# Patient Record
Sex: Female | Born: 1945
Health system: Southern US, Community
[De-identification: ages and names within clinical notes are randomized; demographics above are authoritative.]

## PROBLEM LIST (undated history)

## (undated) DIAGNOSIS — F32A Depression, unspecified: Secondary | ICD-10-CM

## (undated) DIAGNOSIS — F329 Major depressive disorder, single episode, unspecified: Secondary | ICD-10-CM

## (undated) DIAGNOSIS — I10 Essential (primary) hypertension: Secondary | ICD-10-CM

## (undated) DIAGNOSIS — C801 Malignant (primary) neoplasm, unspecified: Secondary | ICD-10-CM

## (undated) HISTORY — DX: Depression, unspecified: F32.A

## (undated) HISTORY — DX: Essential (primary) hypertension: I10

## (undated) HISTORY — PX: BACK SURGERY: SHX140

## (undated) HISTORY — PX: ABDOMINAL HYSTERECTOMY: SHX81

---
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
## 1999-12-05 DIAGNOSIS — C801 Malignant (primary) neoplasm, unspecified: Secondary | ICD-10-CM

## 1999-12-05 HISTORY — DX: Malignant (primary) neoplasm, unspecified: C80.1

## 2000-07-02 ENCOUNTER — Ambulatory Visit (HOSPITAL_COMMUNITY): Admission: RE | Admit: 2000-07-02 | Discharge: 2000-07-02 | Payer: Self-pay | Admitting: Radiation Oncology

## 2000-07-05 ENCOUNTER — Encounter: Admission: RE | Admit: 2000-07-05 | Discharge: 2000-10-03 | Payer: Self-pay | Admitting: Radiation Oncology

## 2006-12-12 ENCOUNTER — Encounter: Admission: RE | Admit: 2006-12-12 | Discharge: 2006-12-12 | Payer: Self-pay | Admitting: Hematology and Oncology

## 2007-02-26 ENCOUNTER — Ambulatory Visit (HOSPITAL_BASED_OUTPATIENT_CLINIC_OR_DEPARTMENT_OTHER): Admission: RE | Admit: 2007-02-26 | Discharge: 2007-02-26 | Payer: Self-pay | Admitting: Urology

## 2011-04-21 NOTE — Op Note (Signed)
NAME:  Carolyn Erickson, PRIM NO.:  [id number]   MEDICAL RECORD NO.:  [id number]          PATIENT TYPE:  AMB   LOCATION:  [facility]                         FACILITY:  [facility]   PHYSICIAN:  Jamison Neighbor, M.D.  DATE OF BIRTH:  March 11, 1946   DATE OF PROCEDURE:  02/26/2007  DATE OF DISCHARGE:                               OPERATIVE REPORT   PREOPERATIVE DIAGNOSES:  Urgency, incontinence.   POSTOPERATIVE DIAGNOSES:  Urgency, incontinence.   PROCEDURE:  Cystoscopy, urethral calibration, and Botox injection.   SURGEON:  Jamison Neighbor, M.D.   ANESTHESIA:  General.   COMPLICATIONS:  None.   DRAINS:  None.   BRIEF HISTORY:  This 65 year old female has had problems with severe  urinary urgency and frequency.  Urodynamics shows an extremely unstable  bladder.  The patient has had a lack of response to anticholinergic  therapy and has tried and failed at least 3 different medications.  The  patient was offered several options for therapy.  She did not feel she  could come to the office for physical therapy.  She was not willing to  consider posterior tibial nerve stimulation.  She did not like the idea  of InterStim, and at this point is unwilling to consider augmentation  cystoplasty or other lower urinary tract reconstruction.  The patient is  willing to try a Botox injection.  She is fully aware of the  experimental nature of this treatment and the fact that there is  certainly no guarantee that she is going to have improvement in her  frequency and urgency.  She also is aware of the fact that she could  have problems with incomplete emptying that may require catheterization  or in-and-out catheterization.  She gave full informed consent.   PROCEDURE:  After successful induction of general anesthesia, the  patient was placed in the dorsal lithotomy position, prepped with  Betadine and draped in the usual sterile fashion.  Careful bimanual  examination revealed an  unremarkable vaginal vault.  She was well  supported at the urethra.  There was no sign of a diverticulum.  She did  not have vault prolapse.  The vault was slightly small following  hysterectomy.  There was no significant cystocele, rectocele or  enterocele.  The urethra was slightly tight but was easily dilated at 71  [place] with female urethral sounds.  The cystoscope was inserted.  The  bladder was carefully inspected.  Both ureteral orifices were normal in  configuration and location.  Clear urine was seen to efflux from each.  No tumors or stones could be identified.  The patient does have a very  trabeculated bladder which is consistent with detrusor sphincter  dyssynergia and dysfunctional voiding as evidenced by her urodynamic  study.  This certainly may indicate a good response to the Botox and may  also indicate that the patient could respond in theory to muscle  relaxant therapy directed at the pelvic  floor.  The patient received a total of 3 ampules of Botox in divided  dosages throughout the bladder including the trigone.  The bladder was  drained.  The patient tolerated the procedure well and was taken to the  recovery room in good condition.           ______________________________  Jamison Neighbor, M.D.  Electronically Signed     RJE/MEDQ  D:  02/26/2007  T:  02/26/2007  Job:  [id number]

## 2016-05-12 DIAGNOSIS — F39 Unspecified mood [affective] disorder: Secondary | ICD-10-CM | POA: Diagnosis not present

## 2016-05-12 DIAGNOSIS — Z0000 Encounter for general adult medical examination without abnormal findings: Secondary | ICD-10-CM | POA: Diagnosis not present

## 2016-05-12 DIAGNOSIS — I10 Essential (primary) hypertension: Secondary | ICD-10-CM | POA: Diagnosis not present

## 2016-05-12 DIAGNOSIS — Z6822 Body mass index (BMI) 22.0-22.9, adult: Secondary | ICD-10-CM | POA: Diagnosis not present

## 2016-05-12 DIAGNOSIS — Z853 Personal history of malignant neoplasm of breast: Secondary | ICD-10-CM | POA: Diagnosis not present

## 2016-05-12 DIAGNOSIS — E7800 Pure hypercholesterolemia, unspecified: Secondary | ICD-10-CM | POA: Diagnosis not present

## 2016-05-12 DIAGNOSIS — M5136 Other intervertebral disc degeneration, lumbar region: Secondary | ICD-10-CM | POA: Diagnosis not present

## 2016-05-12 DIAGNOSIS — M5030 Other cervical disc degeneration, unspecified cervical region: Secondary | ICD-10-CM | POA: Diagnosis not present

## 2016-05-15 DIAGNOSIS — Z1231 Encounter for screening mammogram for malignant neoplasm of breast: Secondary | ICD-10-CM | POA: Diagnosis not present

## 2016-07-21 DIAGNOSIS — Z6822 Body mass index (BMI) 22.0-22.9, adult: Secondary | ICD-10-CM | POA: Diagnosis not present

## 2016-07-21 DIAGNOSIS — R300 Dysuria: Secondary | ICD-10-CM | POA: Diagnosis not present

## 2016-09-13 DIAGNOSIS — R05 Cough: Secondary | ICD-10-CM | POA: Diagnosis not present

## 2016-09-13 DIAGNOSIS — J209 Acute bronchitis, unspecified: Secondary | ICD-10-CM | POA: Diagnosis not present

## 2016-09-18 DIAGNOSIS — H903 Sensorineural hearing loss, bilateral: Secondary | ICD-10-CM | POA: Diagnosis not present

## 2016-09-20 DIAGNOSIS — Z23 Encounter for immunization: Secondary | ICD-10-CM | POA: Diagnosis not present

## 2016-11-22 DIAGNOSIS — L219 Seborrheic dermatitis, unspecified: Secondary | ICD-10-CM | POA: Diagnosis not present

## 2016-11-22 DIAGNOSIS — L570 Actinic keratosis: Secondary | ICD-10-CM | POA: Diagnosis not present

## 2016-12-18 DIAGNOSIS — H40033 Anatomical narrow angle, bilateral: Secondary | ICD-10-CM | POA: Diagnosis not present

## 2016-12-18 DIAGNOSIS — H2513 Age-related nuclear cataract, bilateral: Secondary | ICD-10-CM | POA: Diagnosis not present

## 2016-12-28 DIAGNOSIS — H43813 Vitreous degeneration, bilateral: Secondary | ICD-10-CM | POA: Diagnosis not present

## 2016-12-28 DIAGNOSIS — H25813 Combined forms of age-related cataract, bilateral: Secondary | ICD-10-CM | POA: Diagnosis not present

## 2017-01-15 DIAGNOSIS — H2512 Age-related nuclear cataract, left eye: Secondary | ICD-10-CM | POA: Diagnosis not present

## 2017-01-15 DIAGNOSIS — H25812 Combined forms of age-related cataract, left eye: Secondary | ICD-10-CM | POA: Diagnosis not present

## 2017-01-22 DIAGNOSIS — H04123 Dry eye syndrome of bilateral lacrimal glands: Secondary | ICD-10-CM | POA: Diagnosis not present

## 2017-01-22 DIAGNOSIS — H25812 Combined forms of age-related cataract, left eye: Secondary | ICD-10-CM | POA: Diagnosis not present

## 2017-01-22 DIAGNOSIS — Z961 Presence of intraocular lens: Secondary | ICD-10-CM | POA: Diagnosis not present

## 2017-02-12 DIAGNOSIS — R238 Other skin changes: Secondary | ICD-10-CM | POA: Diagnosis not present

## 2017-02-14 DIAGNOSIS — Z1272 Encounter for screening for malignant neoplasm of vagina: Secondary | ICD-10-CM | POA: Diagnosis not present

## 2017-02-14 DIAGNOSIS — Z01419 Encounter for gynecological examination (general) (routine) without abnormal findings: Secondary | ICD-10-CM | POA: Diagnosis not present

## 2017-02-14 DIAGNOSIS — I10 Essential (primary) hypertension: Secondary | ICD-10-CM | POA: Diagnosis not present

## 2017-02-14 DIAGNOSIS — F339 Major depressive disorder, recurrent, unspecified: Secondary | ICD-10-CM | POA: Diagnosis not present

## 2017-04-02 DIAGNOSIS — H25811 Combined forms of age-related cataract, right eye: Secondary | ICD-10-CM | POA: Diagnosis not present

## 2017-04-02 DIAGNOSIS — Z961 Presence of intraocular lens: Secondary | ICD-10-CM | POA: Diagnosis not present

## 2017-04-18 DIAGNOSIS — H26492 Other secondary cataract, left eye: Secondary | ICD-10-CM | POA: Diagnosis not present

## 2017-05-08 DIAGNOSIS — Z09 Encounter for follow-up examination after completed treatment for conditions other than malignant neoplasm: Secondary | ICD-10-CM | POA: Diagnosis not present

## 2017-05-14 DIAGNOSIS — H04123 Dry eye syndrome of bilateral lacrimal glands: Secondary | ICD-10-CM | POA: Diagnosis not present

## 2017-05-18 DIAGNOSIS — Z0000 Encounter for general adult medical examination without abnormal findings: Secondary | ICD-10-CM | POA: Diagnosis not present

## 2017-05-18 DIAGNOSIS — Z853 Personal history of malignant neoplasm of breast: Secondary | ICD-10-CM | POA: Diagnosis not present

## 2017-05-18 DIAGNOSIS — Z6823 Body mass index (BMI) 23.0-23.9, adult: Secondary | ICD-10-CM | POA: Diagnosis not present

## 2017-05-18 DIAGNOSIS — M5136 Other intervertebral disc degeneration, lumbar region: Secondary | ICD-10-CM | POA: Diagnosis not present

## 2017-05-18 DIAGNOSIS — M5030 Other cervical disc degeneration, unspecified cervical region: Secondary | ICD-10-CM | POA: Diagnosis not present

## 2017-05-18 DIAGNOSIS — Z86010 Personal history of colonic polyps: Secondary | ICD-10-CM | POA: Diagnosis not present

## 2017-05-18 DIAGNOSIS — I10 Essential (primary) hypertension: Secondary | ICD-10-CM | POA: Diagnosis not present

## 2017-05-18 DIAGNOSIS — F39 Unspecified mood [affective] disorder: Secondary | ICD-10-CM | POA: Diagnosis not present

## 2017-05-18 DIAGNOSIS — E7800 Pure hypercholesterolemia, unspecified: Secondary | ICD-10-CM | POA: Diagnosis not present

## 2017-12-10 DIAGNOSIS — H918X1 Other specified hearing loss, right ear: Secondary | ICD-10-CM | POA: Diagnosis not present

## 2017-12-10 DIAGNOSIS — Z01118 Encounter for examination of ears and hearing with other abnormal findings: Secondary | ICD-10-CM | POA: Diagnosis not present

## 2017-12-10 DIAGNOSIS — H93291 Other abnormal auditory perceptions, right ear: Secondary | ICD-10-CM | POA: Diagnosis not present

## 2017-12-17 DIAGNOSIS — H40033 Anatomical narrow angle, bilateral: Secondary | ICD-10-CM | POA: Diagnosis not present

## 2017-12-17 DIAGNOSIS — G44219 Episodic tension-type headache, not intractable: Secondary | ICD-10-CM | POA: Diagnosis not present

## 2017-12-26 DIAGNOSIS — H35373 Puckering of macula, bilateral: Secondary | ICD-10-CM | POA: Diagnosis not present

## 2017-12-26 DIAGNOSIS — H04123 Dry eye syndrome of bilateral lacrimal glands: Secondary | ICD-10-CM | POA: Diagnosis not present

## 2017-12-26 DIAGNOSIS — H26491 Other secondary cataract, right eye: Secondary | ICD-10-CM | POA: Diagnosis not present

## 2018-01-07 DIAGNOSIS — H2513 Age-related nuclear cataract, bilateral: Secondary | ICD-10-CM | POA: Diagnosis not present

## 2018-01-11 DIAGNOSIS — M5412 Radiculopathy, cervical region: Secondary | ICD-10-CM | POA: Diagnosis not present

## 2018-01-11 DIAGNOSIS — M5030 Other cervical disc degeneration, unspecified cervical region: Secondary | ICD-10-CM | POA: Insufficient documentation

## 2018-02-22 DIAGNOSIS — H04123 Dry eye syndrome of bilateral lacrimal glands: Secondary | ICD-10-CM | POA: Diagnosis not present

## 2018-05-28 DIAGNOSIS — Z0000 Encounter for general adult medical examination without abnormal findings: Secondary | ICD-10-CM | POA: Diagnosis not present

## 2018-05-28 DIAGNOSIS — Z13220 Encounter for screening for lipoid disorders: Secondary | ICD-10-CM | POA: Diagnosis not present

## 2018-05-28 DIAGNOSIS — I10 Essential (primary) hypertension: Secondary | ICD-10-CM | POA: Diagnosis not present

## 2018-05-28 DIAGNOSIS — Z136 Encounter for screening for cardiovascular disorders: Secondary | ICD-10-CM | POA: Diagnosis not present

## 2018-05-28 DIAGNOSIS — Z23 Encounter for immunization: Secondary | ICD-10-CM | POA: Diagnosis not present

## 2018-06-17 DIAGNOSIS — N958 Other specified menopausal and perimenopausal disorders: Secondary | ICD-10-CM | POA: Diagnosis not present

## 2018-06-17 DIAGNOSIS — M8589 Other specified disorders of bone density and structure, multiple sites: Secondary | ICD-10-CM | POA: Diagnosis not present

## 2018-06-17 DIAGNOSIS — M8588 Other specified disorders of bone density and structure, other site: Secondary | ICD-10-CM | POA: Diagnosis not present

## 2018-06-17 DIAGNOSIS — M85852 Other specified disorders of bone density and structure, left thigh: Secondary | ICD-10-CM | POA: Diagnosis not present

## 2018-07-09 DIAGNOSIS — Z1231 Encounter for screening mammogram for malignant neoplasm of breast: Secondary | ICD-10-CM | POA: Diagnosis not present

## 2018-12-17 DIAGNOSIS — Z01118 Encounter for examination of ears and hearing with other abnormal findings: Secondary | ICD-10-CM | POA: Diagnosis not present

## 2018-12-17 DIAGNOSIS — H903 Sensorineural hearing loss, bilateral: Secondary | ICD-10-CM | POA: Diagnosis not present

## 2018-12-19 DIAGNOSIS — H40033 Anatomical narrow angle, bilateral: Secondary | ICD-10-CM | POA: Diagnosis not present

## 2018-12-19 DIAGNOSIS — H2513 Age-related nuclear cataract, bilateral: Secondary | ICD-10-CM | POA: Diagnosis not present

## 2019-01-06 DIAGNOSIS — R82998 Other abnormal findings in urine: Secondary | ICD-10-CM | POA: Diagnosis not present

## 2019-01-06 DIAGNOSIS — R109 Unspecified abdominal pain: Secondary | ICD-10-CM | POA: Diagnosis not present

## 2019-01-06 DIAGNOSIS — L659 Nonscarring hair loss, unspecified: Secondary | ICD-10-CM | POA: Diagnosis not present

## 2019-01-16 DIAGNOSIS — R82998 Other abnormal findings in urine: Secondary | ICD-10-CM | POA: Diagnosis not present

## 2019-01-18 DIAGNOSIS — J0100 Acute maxillary sinusitis, unspecified: Secondary | ICD-10-CM | POA: Diagnosis not present

## 2019-01-18 DIAGNOSIS — F320 Major depressive disorder, single episode, mild: Secondary | ICD-10-CM | POA: Diagnosis not present

## 2019-05-29 ENCOUNTER — Ambulatory Visit (INDEPENDENT_AMBULATORY_CARE_PROVIDER_SITE_OTHER): Payer: PPO | Admitting: Family Medicine

## 2019-05-29 ENCOUNTER — Encounter: Payer: Self-pay | Admitting: Family Medicine

## 2019-05-29 ENCOUNTER — Other Ambulatory Visit: Payer: Self-pay

## 2019-05-29 VITALS — BP 122/74 | HR 81 | Temp 97.2°F | Ht 60.0 in | Wt 123.8 lb

## 2019-05-29 DIAGNOSIS — F339 Major depressive disorder, recurrent, unspecified: Secondary | ICD-10-CM

## 2019-05-29 DIAGNOSIS — I10 Essential (primary) hypertension: Secondary | ICD-10-CM | POA: Insufficient documentation

## 2019-05-29 DIAGNOSIS — E782 Mixed hyperlipidemia: Secondary | ICD-10-CM | POA: Insufficient documentation

## 2019-05-29 DIAGNOSIS — E785 Hyperlipidemia, unspecified: Secondary | ICD-10-CM | POA: Insufficient documentation

## 2019-05-29 DIAGNOSIS — R109 Unspecified abdominal pain: Secondary | ICD-10-CM | POA: Diagnosis not present

## 2019-05-29 DIAGNOSIS — N3000 Acute cystitis without hematuria: Secondary | ICD-10-CM

## 2019-05-29 LAB — MICROSCOPIC EXAMINATION: WBC, UA: >30 /hpf — AB (ref 0–5)

## 2019-05-29 LAB — URINALYSIS, COMPLETE
Bilirubin, UA: NEGATIVE
Glucose, UA: NEGATIVE
Ketones, UA: NEGATIVE
Nitrite, UA: POSITIVE — AB
Specific Gravity, UA: 1.025 (ref 1.005–1.030)
Urobilinogen, Ur: 1 mg/dL (ref 0.2–1.0)
pH, UA: 7 (ref 5.0–7.5)

## 2019-05-29 MED ORDER — VENLAFAXINE HCL 37.5 MG PO TABS: 37.5000 mg | ORAL_TABLET | Freq: Two times a day (BID) | ORAL | 1 refills | Status: DC

## 2019-05-29 MED ORDER — CEPHALEXIN 500 MG PO CAPS: 500.0000 mg | ORAL_CAPSULE | Freq: Four times a day (QID) | ORAL | 0 refills | Status: DC

## 2019-05-29 MED ORDER — SIMVASTATIN 40 MG PO TABS
40.0000 mg | ORAL_TABLET | Freq: Every day | ORAL | 1 refills | Status: DC
Start: 2019-05-29 — End: 2019-11-26

## 2019-05-29 MED ORDER — BUPROPION HCL 100 MG PO TABS: 100.0000 mg | ORAL_TABLET | Freq: Two times a day (BID) | ORAL | 1 refills | Status: DC

## 2019-05-29 NOTE — Progress Notes (Signed)
BP 122/74   Pulse 81   Temp (!) 97.2 F (36.2 C) (Oral)   Ht 5' (1.524 m)   Wt 123 lb 12.8 oz (56.2 kg)   BMI 24.18 kg/m    Subjective:   Patient ID: Carolyn Erickson, female    DOB: March 13, 1946, 73 y.o.   MRN: [id number]  HPI: Carolyn Erickson is a 73 y.o. female presenting on 05/29/2019 for New Patient (Initial Visit) [facility] family practice ), Establish Care, Flank Pain (left- x 2 days ), Depression, and Hypertension (Patient is not sure the medication she was taking for her bp.)   HPI Left flank pain that is been going on for the past couple days.  She says she just started having really severe pain in her left flank that led to her whole body aching.  She denies any urinary burning or frequency that she knows of.  She denies any history of stones but has had a history of urinary tract infections.  She denies any fevers or chills.  She says most of the pain stems from that left flank but because she has chronic back pain sometimes it hurts a crossed everywhere.  She has used some salon pause patches which have helped some but are not getting rid of it.  Depression, establish care Patient is coming in to establish care for depression.  She currently takes Wellbutrin and Effexor.  She says she is doing well on these medications and says that the are working very efficiently for.  She takes 100 mg Wellbutrin twice a day and Effexor 37.5 mg twice a day as well. Depression screen [facility] 2/9 05/29/2019  Decreased Interest 0  Down, Depressed, Hopeless 0  PHQ - 2 Score 0    Hypertension Patient is currently on no medication and she used to be on Hyzaar but she is been out of it for a month, and their blood pressure today is 122/74. Patient denies any lightheadedness or dizziness. Patient denies headaches, blurred vision, chest pains, shortness of breath, or weakness. Denies any side effects from medication and is content with current medication.   Hyperlipidemia Patient is coming in for recheck of  his hyperlipidemia. The patient is currently taking simvastatin. They deny any issues with myalgias or history of liver damage from it. They deny any focal numbness or weakness or chest pain.   Relevant past medical, surgical, family and social history reviewed and updated as indicated. Interim medical history since our last visit reviewed. Allergies and medications reviewed and updated.  Review of Systems  Constitutional: Negative for chills and fever.  Respiratory: Negative for chest tightness and shortness of breath.   Cardiovascular: Negative for chest pain and leg swelling.  Gastrointestinal: Negative for abdominal pain, diarrhea, nausea and vomiting.  Genitourinary: Positive for flank pain and frequency. Negative for difficulty urinating, dysuria, pelvic pain and urgency.  Musculoskeletal: Positive for arthralgias and back pain. Negative for gait problem.  Skin: Negative for rash.  Neurological: Negative for dizziness, light-headedness and headaches.  Psychiatric/Behavioral: Negative for agitation and behavioral problems.  All other systems reviewed and are negative.   Per HPI unless specifically indicated above   Allergies as of 05/29/2019   No Known Allergies     Medication List       Accurate as of May 29, 2019  9:46 AM. If you have any questions, ask your nurse or doctor.        buPROPion 100 MG tablet Commonly known as: WELLBUTRIN Take 1 tablet  by mouth 2 (two) times a day.   simvastatin 40 MG tablet Commonly known as: ZOCOR Take 1 tablet by mouth daily.   venlafaxine 37.5 MG tablet Commonly known as: EFFEXOR Take 37.5 mg by mouth 2 (two) times daily.        Objective:   BP 122/74   Pulse 81   Temp (!) 97.2 F (36.2 C) (Oral)   Ht 5' (1.524 m)   Wt 123 lb 12.8 oz (56.2 kg)   BMI 24.18 kg/m   Wt Readings from Last 3 Encounters:  05/29/19 123 lb 12.8 oz (56.2 kg)    Physical Exam Vitals signs and nursing note reviewed.  Constitutional:       General: She is not in acute distress.    Appearance: She is well-developed. She is not diaphoretic.  Eyes:     Conjunctiva/sclera: Conjunctivae normal.  Cardiovascular:     Rate and Rhythm: Normal rate and regular rhythm.     Heart sounds: Normal heart sounds. No murmur.  Pulmonary:     Effort: Pulmonary effort is normal. No respiratory distress.     Breath sounds: Normal breath sounds. No wheezing.  Abdominal:     Tenderness: There is no abdominal tenderness. There is left CVA tenderness. There is no right CVA tenderness.  Musculoskeletal: Normal range of motion.        General: No tenderness.  Skin:    General: Skin is warm and dry.     Findings: No rash.  Neurological:     Mental Status: She is alert and oriented to person, place, and time.     Coordination: Coordination normal.  Psychiatric:        Behavior: Behavior normal.     Urinalysis: Greater than 30 WBCs, 11-30 RBCs, many bacteria, nitrite positive, leukocytes 3+, protein 2+ and blood 1+.  Assessment & Plan:   Problem List Items Addressed This Visit      Cardiovascular and Mediastinum   Hypertension   Relevant Medications   simvastatin (ZOCOR) 40 MG tablet   Other Relevant Orders   CMP14+EGFR     Other   Depression, recurrent ([place])   Relevant Medications   buPROPion (WELLBUTRIN) 100 MG tablet   venlafaxine (EFFEXOR) 37.5 MG tablet   Other Relevant Orders   CBC with Differential/Platelet   Hyperlipidemia   Relevant Medications   simvastatin (ZOCOR) 40 MG tablet   Other Relevant Orders   Lipid panel    Other Visit Diagnoses    Acute cystitis without hematuria    -  Primary   Relevant Orders   Urinalysis, Complete   Urine Culture      Continue Wellbutrin and Effexor and simvastatin, for now we will not treat with the hypertensive medication because her blood pressure looks great she is been off of it for over a month. Follow up plan: Return in about 6 months (around 11/28/2019), or if symptoms  worsen or fail to improve, for Follow-up depression and cholesterol.  Counseling provided for all of the vaccine components No orders of the defined types were placed in this encounter.   Caryl Pina, MD [facility] 05/29/2019, 9:46 AM

## 2019-05-30 LAB — CMP14+EGFR
ALT: 22 IU/L (ref 0–32)
AST: 24 IU/L (ref 0–40)
Albumin/Globulin Ratio: 1.9 (ref 1.2–2.2)
Albumin: 4.4 g/dL (ref 3.7–4.7)
Alkaline Phosphatase: 64 IU/L (ref 39–117)
BUN/Creatinine Ratio: 18 (ref 12–28)
BUN: 19 mg/dL (ref 8–27)
Bilirubin Total: 0.6 mg/dL (ref 0.0–1.2)
CO2: 26 mmol/L (ref 20–29)
Calcium: 9.7 mg/dL (ref 8.7–10.3)
Chloride: 101 mmol/L (ref 96–106)
Creatinine, Ser: 1.03 mg/dL — ABNORMAL HIGH (ref 0.57–1.00)
GFR calc Af Amer: 61 mL/min/{1.73_m2} (ref >59)
GFR calc non Af Amer: 53 mL/min/{1.73_m2} — ABNORMAL LOW (ref >59)
Globulin, Total: 2.3 g/dL (ref 1.5–4.5)
Glucose: 88 mg/dL (ref 65–99)
Potassium: 4.4 mmol/L (ref 3.5–5.2)
Sodium: 143 mmol/L (ref 134–144)
Total Protein: 6.7 g/dL (ref 6.0–8.5)

## 2019-05-30 LAB — CBC WITH DIFFERENTIAL/PLATELET
Basophils Absolute: 0 10*3/uL (ref 0.0–0.2)
Basos: 0 %
EOS (ABSOLUTE): 0.3 10*3/uL (ref 0.0–0.4)
Eos: 3 %
Hematocrit: 43.4 % (ref 34.0–46.6)
Hemoglobin: 14.6 g/dL (ref 11.1–15.9)
Immature Grans (Abs): 0 10*3/uL (ref 0.0–0.1)
Immature Granulocytes: 0 %
Lymphocytes Absolute: 3.2 10*3/uL — ABNORMAL HIGH (ref 0.7–3.1)
Lymphs: 34 %
MCH: 30.2 pg (ref 26.6–33.0)
MCHC: 33.6 g/dL (ref 31.5–35.7)
MCV: 90 fL (ref 79–97)
Monocytes Absolute: 1 10*3/uL — ABNORMAL HIGH (ref 0.1–0.9)
Monocytes: 10 %
Neutrophils Absolute: 5 10*3/uL (ref 1.4–7.0)
Neutrophils: 53 %
Platelets: 270 10*3/uL (ref 150–450)
RBC: 4.84 x10E6/uL (ref 3.77–5.28)
RDW: 11.7 % (ref 11.7–15.4)
WBC: 9.5 10*3/uL (ref 3.4–10.8)

## 2019-05-30 LAB — LIPID PANEL
Chol/HDL Ratio: 2.3 ratio (ref 0.0–4.4)
Cholesterol, Total: 185 mg/dL (ref 100–199)
HDL: 81 mg/dL (ref >39)
LDL Calculated: 91 mg/dL (ref 0–99)
Triglycerides: 65 mg/dL (ref 0–149)
VLDL Cholesterol Cal: 13 mg/dL (ref 5–40)

## 2019-05-31 LAB — URINE CULTURE

## 2019-06-05 ENCOUNTER — Telehealth: Payer: Self-pay | Admitting: Family Medicine

## 2019-06-05 NOTE — Telephone Encounter (Signed)
Patient was seen 6/25 for flank pain and states the abx is not helping- please advise

## 2019-06-06 NOTE — Telephone Encounter (Signed)
The urine that grew says it should be susceptible to that antibiotic, make sure she is taking it 4 times a day like prescribed, if it is not helping sufficiently and she is taking it 4 times a day then let me know.  If she is not taking it 4 times a day than have her increase to that and drink plenty of fluids.

## 2019-06-13 NOTE — Telephone Encounter (Signed)
Attempted to contact patient - number is not working.

## 2019-06-16 ENCOUNTER — Ambulatory Visit: Payer: Self-pay | Admitting: Family Medicine

## 2019-06-16 NOTE — Telephone Encounter (Signed)
Multiple attempts made to contact patient.  This encounter will now be closed  

## 2019-06-20 ENCOUNTER — Encounter: Payer: Self-pay | Admitting: Physician Assistant

## 2019-06-20 ENCOUNTER — Other Ambulatory Visit: Payer: Self-pay

## 2019-06-20 ENCOUNTER — Ambulatory Visit (INDEPENDENT_AMBULATORY_CARE_PROVIDER_SITE_OTHER): Payer: PPO | Admitting: Physician Assistant

## 2019-06-20 VITALS — BP 151/84 | HR 73 | Temp 97.0°F | Ht 60.0 in | Wt 126.0 lb

## 2019-06-20 DIAGNOSIS — M5030 Other cervical disc degeneration, unspecified cervical region: Secondary | ICD-10-CM | POA: Diagnosis not present

## 2019-06-20 DIAGNOSIS — M5136 Other intervertebral disc degeneration, lumbar region: Secondary | ICD-10-CM | POA: Diagnosis not present

## 2019-06-20 MED ORDER — MELOXICAM 7.5 MG PO TABS: 7.5000 mg | ORAL_TABLET | Freq: Every day | ORAL | 0 refills | Status: DC

## 2019-06-20 NOTE — Patient Instructions (Signed)

## 2019-06-20 NOTE — Progress Notes (Signed)
BP (!) 151/84   Pulse 73   Temp (!) 97 F (36.1 C) (Oral)   Ht 5' (1.524 m)   Wt 126 lb (57.2 kg)   BMI 24.61 kg/m    Subjective:    Patient ID: Carolyn Erickson, female    DOB: [birth year], 73 y.o.   MRN: [id number]  HPI: Carolyn Erickson is a 73 y.o. female presenting on 06/20/2019 for Back Pain, Flank Pain, and Neck Pain  This patient comes in having about 1 week of lower thoracic upper lumbar back pain.  And she also starting to have it in her low back.  It does bother her whenever she standing for very long.  She does have known degenerative disc disease of the cervical and lumbar spines.  After asked about her cervical spine she states that it does bother her sometimes to but the most pain is in the flank and lumbar area.  She denies any change with her urine, bowel movements, no fever or chills.  She has been doing some gardening, but she denies any specific injury that is making her back hurt.  Past Medical History:  Diagnosis Date  . Cancer [facility]) 2001   breast left  . Depression   . Hypertension    Relevant past medical, surgical, family and social history reviewed and updated as indicated. Interim medical history since our last visit reviewed. Allergies and medications reviewed and updated. DATA REVIEWED: CHART IN EPIC  Family History reviewed for pertinent findings.  Review of Systems  Constitutional: Negative.   HENT: Negative.   Eyes: Negative.   Respiratory: Negative.   Gastrointestinal: Negative.   Genitourinary: Negative.   Musculoskeletal: Positive for arthralgias, back pain, myalgias and neck pain.    Allergies as of 06/20/2019   No Known Allergies     Medication List       Accurate as of June 20, 2019 11:08 AM. If you have any questions, ask your nurse or doctor.        STOP taking these medications   cephALEXin 500 MG capsule Commonly known as: KEFLEX Stopped by: Terald Sleeper, PA-C     TAKE these medications   buPROPion 100 MG tablet  Commonly known as: WELLBUTRIN Take 1 tablet (100 mg total) by mouth 2 (two) times a day.   meloxicam 7.5 MG tablet Commonly known as: MOBIC Take 1 tablet (7.5 mg total) by mouth daily. Started by: Terald Sleeper, PA-C   simvastatin 40 MG tablet Commonly known as: ZOCOR Take 1 tablet (40 mg total) by mouth daily.   venlafaxine 37.5 MG tablet Commonly known as: EFFEXOR Take 1 tablet (37.5 mg total) by mouth 2 (two) times daily.          Objective:    BP (!) 151/84   Pulse 73   Temp (!) 97 F (36.1 C) (Oral)   Ht 5' (1.524 m)   Wt 126 lb (57.2 kg)   BMI 24.61 kg/m   No Known Allergies  Wt Readings from Last 3 Encounters:  06/20/19 126 lb (57.2 kg)  05/29/19 123 lb 12.8 oz (56.2 kg)    Physical Exam Constitutional:      Appearance: She is well-developed.  HENT:     Head: Normocephalic and atraumatic.  Eyes:     Conjunctiva/sclera: Conjunctivae normal.     Pupils: Pupils are equal, round, and reactive to light.  Cardiovascular:     Rate and Rhythm: Normal rate and regular rhythm.  Heart sounds: Normal heart sounds.  Pulmonary:     Effort: Pulmonary effort is normal.  Musculoskeletal:     Thoracic back: She exhibits decreased range of motion, tenderness and pain.     Lumbar back: She exhibits decreased range of motion, tenderness and pain.       Back:  Skin:    General: Skin is warm and dry.     Findings: No rash.  Neurological:     Mental Status: She is alert and oriented to person, place, and time.     Deep Tendon Reflexes: Reflexes are normal and symmetric.         Assessment & Plan:   1. DDD (degenerative disc disease), lumbar - meloxicam (MOBIC) 7.5 MG tablet; Take 1 tablet (7.5 mg total) by mouth daily.  Dispense: 30 tablet; Refill: 0  2. Other cervical disc degeneration, unspecified cervical region - meloxicam (MOBIC) 7.5 MG tablet; Take 1 tablet (7.5 mg total) by mouth daily.  Dispense: 30 tablet; Refill: 0  Please have the meloxicam for  about 7 to 10 days and see if this calms down your back.  And then put the medication up in case she have another flareup of your back.  Follow-up with our office or your PCP if anything worsens.  Continue all other maintenance medications as listed above.  Follow up plan: No follow-ups on file.  Educational handout given for back exercises  Terald Sleeper PA-C [facility] [address]  [place],  [address] [phone]   06/20/2019, 11:08 AM

## 2019-07-17 DIAGNOSIS — R300 Dysuria: Secondary | ICD-10-CM | POA: Diagnosis not present

## 2019-07-22 DIAGNOSIS — M7918 Myalgia, other site: Secondary | ICD-10-CM | POA: Diagnosis not present

## 2019-08-22 ENCOUNTER — Encounter: Payer: Self-pay | Admitting: *Deleted

## 2019-09-01 DIAGNOSIS — Z1231 Encounter for screening mammogram for malignant neoplasm of breast: Secondary | ICD-10-CM | POA: Diagnosis not present

## 2019-11-26 ENCOUNTER — Ambulatory Visit (INDEPENDENT_AMBULATORY_CARE_PROVIDER_SITE_OTHER): Payer: PPO | Admitting: Family Medicine

## 2019-11-26 ENCOUNTER — Other Ambulatory Visit: Payer: Self-pay

## 2019-11-26 ENCOUNTER — Encounter: Payer: Self-pay | Admitting: Family Medicine

## 2019-11-26 VITALS — BP 164/85 | HR 76 | Temp 97.1°F | Ht 61.0 in | Wt 123.6 lb

## 2019-11-26 DIAGNOSIS — Z853 Personal history of malignant neoplasm of breast: Secondary | ICD-10-CM | POA: Insufficient documentation

## 2019-11-26 DIAGNOSIS — I10 Essential (primary) hypertension: Secondary | ICD-10-CM | POA: Diagnosis not present

## 2019-11-26 DIAGNOSIS — M5136 Other intervertebral disc degeneration, lumbar region: Secondary | ICD-10-CM | POA: Diagnosis not present

## 2019-11-26 DIAGNOSIS — E782 Mixed hyperlipidemia: Secondary | ICD-10-CM

## 2019-11-26 DIAGNOSIS — M5030 Other cervical disc degeneration, unspecified cervical region: Secondary | ICD-10-CM | POA: Diagnosis not present

## 2019-11-26 DIAGNOSIS — R238 Other skin changes: Secondary | ICD-10-CM | POA: Insufficient documentation

## 2019-11-26 DIAGNOSIS — F339 Major depressive disorder, recurrent, unspecified: Secondary | ICD-10-CM

## 2019-11-26 DIAGNOSIS — Z6822 Body mass index (BMI) 22.0-22.9, adult: Secondary | ICD-10-CM | POA: Insufficient documentation

## 2019-11-26 DIAGNOSIS — F39 Unspecified mood [affective] disorder: Secondary | ICD-10-CM | POA: Insufficient documentation

## 2019-11-26 DIAGNOSIS — J209 Acute bronchitis, unspecified: Secondary | ICD-10-CM | POA: Insufficient documentation

## 2019-11-26 DIAGNOSIS — Z86010 Personal history of colonic polyps: Secondary | ICD-10-CM | POA: Insufficient documentation

## 2019-11-26 DIAGNOSIS — E7800 Pure hypercholesterolemia, unspecified: Secondary | ICD-10-CM | POA: Insufficient documentation

## 2019-11-26 DIAGNOSIS — Z860101 Personal history of adenomatous and serrated colon polyps: Secondary | ICD-10-CM | POA: Insufficient documentation

## 2019-11-26 MED ORDER — MELOXICAM 7.5 MG PO TABS: 7.5000 mg | ORAL_TABLET | Freq: Every day | ORAL | 5 refills | Status: DC

## 2019-11-26 MED ORDER — SIMVASTATIN 40 MG PO TABS: 40.0000 mg | ORAL_TABLET | Freq: Every day | ORAL | 3 refills | Status: DC

## 2019-11-26 MED ORDER — AMLODIPINE BESYLATE 5 MG PO TABS: 5.0000 mg | ORAL_TABLET | Freq: Every day | ORAL | 3 refills | Status: DC

## 2019-11-26 MED ORDER — VENLAFAXINE HCL 37.5 MG PO TABS: 37.5000 mg | ORAL_TABLET | Freq: Two times a day (BID) | ORAL | 3 refills | Status: DC

## 2019-11-26 MED ORDER — BUPROPION HCL 100 MG PO TABS: 100.0000 mg | ORAL_TABLET | Freq: Two times a day (BID) | ORAL | 3 refills | Status: DC

## 2019-11-26 NOTE — Progress Notes (Signed)
BP (!) 164/85   Pulse 76   Temp (!) 97.1 F (36.2 C) (Temporal)   Ht 5' 1"  (1.549 m)   Wt 123 lb 9.6 oz (56.1 kg)   SpO2 100%   BMI 23.35 kg/m    Subjective:   Patient ID: Carolyn Erickson, female    DOB: [birth year], 73 y.o.   MRN: [id number]  HPI: Carolyn Erickson is a 73 y.o. female presenting on 11/26/2019 for Medical Management of Chronic Issues (6 month follow up)   HPI Hypertension Patient is currently on no medication currently but says blood pressures have been running up at home as well as here, and their blood pressure today is 164/85. Patient denies any lightheadedness or dizziness. Patient denies headaches, blurred vision, chest pains, shortness of breath, or weakness. Denies any side effects from medication and is content with current medication.   Hyperlipidemia Patient is coming in for recheck of his hyperlipidemia. The patient is currently taking simvastatin. They deny any issues with myalgias or history of liver damage from it. They deny any focal numbness or weakness or chest pain.   Depression Patient is coming in for recheck of depression A.  She is currently on Effexor and Wellbutrin and she feels like things are under control and doing very well and she is very satisfied with them.  She denies any suicidal ideations or thoughts of hurting herself.  She would wish to continue on the same medications. PHQ9 SCORE ONLY 11/26/2019 06/20/2019 05/29/2019  Score 8 0 0     Relevant past medical, surgical, family and social history reviewed and updated as indicated. Interim medical history since our last visit reviewed. Allergies and medications reviewed and updated.  Review of Systems  Constitutional: Negative for chills and fever.  Eyes: Negative for visual disturbance.  Respiratory: Negative for chest tightness and shortness of breath.   Cardiovascular: Negative for chest pain and leg swelling.  Genitourinary: Negative for difficulty urinating and dysuria.    Musculoskeletal: Negative for back pain and gait problem.  Skin: Negative for rash.  Neurological: Positive for weakness (Patient is having weakness and balance disorder, we discussed this due to deconditioning because of the Covid and being stuck at home, gave some ideas for exercise and training at home, she wants referral for PT in the future we will do). Negative for dizziness, light-headedness and headaches.  Psychiatric/Behavioral: Negative for agitation and behavioral problems.  All other systems reviewed and are negative.   Per HPI unless specifically indicated above   Allergies as of 11/26/2019   No Known Allergies     Medication List       Accurate as of November 26, 2019  9:49 AM. If you have any questions, ask your nurse or doctor.        amLODipine 5 MG tablet Commonly known as: NORVASC Take 1 tablet (5 mg total) by mouth daily. Started by: Worthy Rancher, MD   buPROPion 100 MG tablet Commonly known as: WELLBUTRIN Take 1 tablet (100 mg total) by mouth 2 (two) times daily. What changed: when to take this Changed by: Fransisca Kaufmann Katherin Ramey, MD   meloxicam 7.5 MG tablet Commonly known as: MOBIC Take 1 tablet (7.5 mg total) by mouth daily.   simvastatin 40 MG tablet Commonly known as: ZOCOR Take 1 tablet (40 mg total) by mouth daily.   venlafaxine 37.5 MG tablet Commonly known as: EFFEXOR Take 1 tablet (37.5 mg total) by mouth 2 (two) times daily.  Objective:   BP (!) 164/85   Pulse 76   Temp (!) 97.1 F (36.2 C) (Temporal)   Ht 5' 1"  (1.549 m)   Wt 123 lb 9.6 oz (56.1 kg)   SpO2 100%   BMI 23.35 kg/m   Wt Readings from Last 3 Encounters:  11/26/19 123 lb 9.6 oz (56.1 kg)  06/20/19 126 lb (57.2 kg)  05/29/19 123 lb 12.8 oz (56.2 kg)    Physical Exam Vitals and nursing note reviewed.  Constitutional:      General: She is not in acute distress.    Appearance: She is well-developed. She is not diaphoretic.  Eyes:      Conjunctiva/sclera: Conjunctivae normal.  Cardiovascular:     Rate and Rhythm: Normal rate and regular rhythm.     Heart sounds: Normal heart sounds. No murmur.  Pulmonary:     Effort: Pulmonary effort is normal. No respiratory distress.     Breath sounds: Normal breath sounds. No wheezing.  Musculoskeletal:        General: No tenderness. Normal range of motion.  Skin:    General: Skin is warm and dry.     Findings: No rash.  Neurological:     Mental Status: She is alert and oriented to person, place, and time.     Coordination: Coordination normal.  Psychiatric:        Behavior: Behavior normal.       Assessment & Plan:   Problem List Items Addressed This Visit      Cardiovascular and Mediastinum   Hypertension - Primary   Relevant Medications   simvastatin (ZOCOR) 40 MG tablet   amLODipine (NORVASC) 5 MG tablet   Other Relevant Orders   CBC with Differential/Platelet   CMP14+EGFR     Musculoskeletal and Integument   DDD (degenerative disc disease), lumbar   Relevant Medications   meloxicam (MOBIC) 7.5 MG tablet   Other cervical disc degeneration, unspecified cervical region   Relevant Medications   meloxicam (MOBIC) 7.5 MG tablet     Other   Depression, recurrent ([facility])   Relevant Medications   buPROPion (WELLBUTRIN) 100 MG tablet   venlafaxine (EFFEXOR) 37.5 MG tablet   Other Relevant Orders   CBC with Differential/Platelet   Hyperlipidemia   Relevant Medications   simvastatin (ZOCOR) 40 MG tablet   amLODipine (NORVASC) 5 MG tablet   Other Relevant Orders   Lipid panel      We will start amlodipine for her blood pressures as a new start.  Continue simvastatin and Effexor and Wellbutrin.  She says she never got the meloxicam and will refill that, recommended to take with food. Follow up plan: Return in about 2 months (around 01/27/2020), or if symptoms worsen or fail to improve, for Recheck of hypertension.  Counseling provided for all of the vaccine  components Orders Placed This Encounter  Procedures  . CBC with Differential/Platelet  . CMP14+EGFR  . Lipid panel    Caryl Pina, MD [facility] 11/26/2019, 9:49 AM

## 2019-12-01 ENCOUNTER — Ambulatory Visit: Payer: PPO | Admitting: Family Medicine

## 2020-01-19 DIAGNOSIS — Z01118 Encounter for examination of ears and hearing with other abnormal findings: Secondary | ICD-10-CM | POA: Diagnosis not present

## 2020-01-19 DIAGNOSIS — H903 Sensorineural hearing loss, bilateral: Secondary | ICD-10-CM | POA: Diagnosis not present

## 2020-01-27 ENCOUNTER — Other Ambulatory Visit: Payer: Self-pay

## 2020-01-28 ENCOUNTER — Ambulatory Visit (INDEPENDENT_AMBULATORY_CARE_PROVIDER_SITE_OTHER): Payer: PPO | Admitting: Family Medicine

## 2020-01-28 ENCOUNTER — Encounter: Payer: Self-pay | Admitting: Family Medicine

## 2020-01-28 ENCOUNTER — Other Ambulatory Visit: Payer: Self-pay

## 2020-01-28 VITALS — BP 133/78 | HR 84 | Temp 99.1°F | Ht 61.0 in | Wt 122.4 lb

## 2020-01-28 DIAGNOSIS — Z1159 Encounter for screening for other viral diseases: Secondary | ICD-10-CM | POA: Diagnosis not present

## 2020-01-28 DIAGNOSIS — I10 Essential (primary) hypertension: Secondary | ICD-10-CM | POA: Diagnosis not present

## 2020-01-28 DIAGNOSIS — F339 Major depressive disorder, recurrent, unspecified: Secondary | ICD-10-CM

## 2020-01-28 DIAGNOSIS — M792 Neuralgia and neuritis, unspecified: Secondary | ICD-10-CM | POA: Diagnosis not present

## 2020-01-28 DIAGNOSIS — E782 Mixed hyperlipidemia: Secondary | ICD-10-CM | POA: Diagnosis not present

## 2020-01-28 NOTE — Progress Notes (Signed)
BP 133/78   Pulse 84   Temp 99.1 F (37.3 C) (Temporal)   Ht 5' 1"  (1.549 m)   Wt 122 lb 6.4 oz (55.5 kg)   SpO2 98%   BMI 23.13 kg/m    Subjective:   Patient ID: Carolyn Erickson, female    DOB: May 12, 1946, 74 y.o.   MRN: [id number]  HPI: Carolyn Erickson is a 74 y.o. female presenting on 01/28/2020 for Hypertension (2 month follow up)   HPI Hypertension Patient is currently on amlodipine, and their blood pressure today is 133/78. Patient denies any lightheadedness or dizziness. Patient denies headaches, blurred vision, chest pains, shortness of breath, or weakness. Denies any side effects from medication and is content with current medication.   Depression Patient is coming in for recheck of depression and anxiety.  She is currently on venlafaxine and Wellbutrin and feels like they are doing very well and denies any major effects or issues with them. Depression screen [facility] 2/9 01/28/2020 11/26/2019 06/20/2019 05/29/2019  Decreased Interest 0 2 0 0  Down, Depressed, Hopeless 0 3 0 0  PHQ - 2 Score 0 5 0 0  Altered sleeping - 0 - -  Tired, decreased energy - 3 - -  Change in appetite - 0 - -  Feeling bad or failure about yourself  - 0 - -  Trouble concentrating - 0 - -  Moving slowly or fidgety/restless - 0 - -  Suicidal thoughts - 0 - -  PHQ-9 Score - 8 - -  Difficult doing work/chores - Not difficult at all - -    Hyperlipidemia Patient is coming in for recheck of his hyperlipidemia. The patient is currently taking simvastatin. They deny any issues with myalgias or history of liver damage from it. They deny any focal numbness or weakness or chest pain.   Relevant past medical, surgical, family and social history reviewed and updated as indicated. Interim medical history since our last visit reviewed. Allergies and medications reviewed and updated.  Review of Systems  Constitutional: Negative for chills and fever.  HENT: Negative for congestion, ear discharge and ear pain.     Eyes: Negative for redness and visual disturbance.  Respiratory: Negative for chest tightness and shortness of breath.   Cardiovascular: Negative for chest pain and leg swelling.  Genitourinary: Negative for difficulty urinating and dysuria.  Musculoskeletal: Negative for back pain and gait problem.  Skin: Negative for rash.  Neurological: Negative for light-headedness and headaches.  Psychiatric/Behavioral: Negative for agitation, behavioral problems, decreased concentration, dysphoric mood, sleep disturbance and suicidal ideas. The patient is not nervous/anxious.   All other systems reviewed and are negative.   Per HPI unless specifically indicated above   Allergies as of 01/28/2020   No Known Allergies     Medication List       Accurate as of January 28, 2020  2:18 PM. If you have any questions, ask your nurse or doctor.        amLODipine 5 MG tablet Commonly known as: NORVASC Take 1 tablet (5 mg total) by mouth daily.   buPROPion 100 MG tablet Commonly known as: WELLBUTRIN Take 1 tablet (100 mg total) by mouth 2 (two) times daily.   meloxicam 7.5 MG tablet Commonly known as: MOBIC Take 1 tablet (7.5 mg total) by mouth daily.   simvastatin 40 MG tablet Commonly known as: ZOCOR Take 1 tablet (40 mg total) by mouth daily.   venlafaxine 37.5 MG tablet Commonly known as: EFFEXOR  Take 1 tablet (37.5 mg total) by mouth 2 (two) times daily.        Objective:   BP 133/78   Pulse 84   Temp 99.1 F (37.3 C) (Temporal)   Ht 5' 1"  (1.549 m)   Wt 122 lb 6.4 oz (55.5 kg)   SpO2 98%   BMI 23.13 kg/m   Wt Readings from Last 3 Encounters:  01/28/20 122 lb 6.4 oz (55.5 kg)  11/26/19 123 lb 9.6 oz (56.1 kg)  06/20/19 126 lb (57.2 kg)    Physical Exam Vitals and nursing note reviewed.  Constitutional:      General: She is not in acute distress.    Appearance: She is well-developed. She is not diaphoretic.  Eyes:     Conjunctiva/sclera: Conjunctivae normal.   Cardiovascular:     Rate and Rhythm: Normal rate and regular rhythm.     Heart sounds: Normal heart sounds. No murmur.  Pulmonary:     Effort: Pulmonary effort is normal. No respiratory distress.     Breath sounds: Normal breath sounds. No wheezing.  Musculoskeletal:        General: No tenderness. Normal range of motion.  Skin:    General: Skin is warm and dry.     Findings: No rash.  Neurological:     Mental Status: She is alert and oriented to person, place, and time.     Coordination: Coordination normal.  Psychiatric:        Behavior: Behavior normal.       Assessment & Plan:   Problem List Items Addressed This Visit      Cardiovascular and Mediastinum   Hypertension - Primary   Relevant Orders   CBC with Differential/Platelet   CMP14+EGFR     Other   Depression, recurrent (HCC)   Relevant Orders   CBC with Differential/Platelet   TSH   Hyperlipidemia   Relevant Orders   Lipid panel    Other Visit Diagnoses    Neuropathic pain of foot, left       Plantar surface of left foot, no pain on exam, will check B12 and thyroid   Relevant Orders   CBC with Differential/Platelet   Vitamin B12   TSH   Need for hepatitis C screening test       Relevant Orders   Hepatitis C antibody      Recommended B complex vitamin and will check B12 and thyroid along with other blood work  No change in medication Follow up plan: Return in about 6 months (around 07/27/2020), or if symptoms worsen or fail to improve.  Counseling provided for all of the vaccine components No orders of the defined types were placed in this encounter.   Caryl Pina, MD [facility] 01/28/2020, 2:18 PM

## 2020-03-01 ENCOUNTER — Ambulatory Visit: Payer: PPO | Admitting: Family

## 2020-03-01 ENCOUNTER — Ambulatory Visit (INDEPENDENT_AMBULATORY_CARE_PROVIDER_SITE_OTHER): Payer: PPO | Admitting: Nurse Practitioner

## 2020-03-01 ENCOUNTER — Encounter: Payer: Self-pay | Admitting: Nurse Practitioner

## 2020-03-01 DIAGNOSIS — R109 Unspecified abdominal pain: Secondary | ICD-10-CM

## 2020-03-01 NOTE — Progress Notes (Signed)
Virtual Visit via telephone Note Due to COVID-19 pandemic this visit was conducted virtually. This visit type was conducted due to national recommendations for restrictions regarding the COVID-19 Pandemic (e.g. social distancing, sheltering in place) in an effort to limit this patient's exposure and mitigate transmission in our community. All issues noted in this document were discussed and addressed.  A physical exam was not performed with this format.  I connected with Carolyn Erickson on 03/01/20 at 10:00 by telephone and verified that I am speaking with the correct person using two identifiers. Carolyn Erickson is currently located at home and her husband  is currently with her during visit. The provider, Mary-Margaret Hassell Done, FNP is located in their office at time of visit.  I discussed the limitations, risks, security and privacy concerns of performing an evaluation and management service by telephone and the availability of in person appointments. I also discussed with the patient that there may be a patient responsible charge related to this service. The patient expressed understanding and agreed to proceed.   History and Present Illness:   Chief Complaint: Flank Pain    HPI: Patient calls in c/o right flank pain. Pain occurs after she eats and sometimes between meals. Rates pain 8/10 lasting a fes minutes to half a day. Nothing really helps pain, it just resolves on its own. She has had nausea occasionally.  This has been occurring off and on for months. She does have occasional heart burn. She still has her gall bladder. Right now she says the pain is under her right breast.    Outpatient Encounter Medications as of 03/01/2020  Medication Sig  . amLODipine (NORVASC) 5 MG tablet Take 1 tablet (5 mg total) by mouth daily.  [name] buPROPion (WELLBUTRIN) 100 MG tablet Take 1 tablet (100 mg total) by mouth 2 (two) times daily.  . meloxicam (MOBIC) 7.5 MG tablet Take 1 tablet (7.5 mg total) by  mouth daily.  . simvastatin (ZOCOR) 40 MG tablet Take 1 tablet (40 mg total) by mouth daily.  [name] venlafaxine (EFFEXOR) 37.5 MG tablet Take 1 tablet (37.5 mg total) by mouth 2 (two) times daily.   No facility-administered encounter medications on file as of 03/01/2020.    Past Surgical History:  Procedure Laterality Date  . ABDOMINAL HYSTERECTOMY    . BACK SURGERY      Family History  Problem Relation Age of Onset  . Diabetes Mother   . Heart disease Mother   . Alzheimer's disease Father   . Parkinson's disease Sister   . Alzheimer's disease Brother     New complaints: None other then stated above  Social history: Lives with her b=husband  Controlled substance contract: n/a    Review of Systems  Constitutional: Negative for diaphoresis and weight loss.  Eyes: Negative for blurred vision, double vision and pain.  Respiratory: Negative for shortness of breath.   Cardiovascular: Negative for chest pain, palpitations, orthopnea and leg swelling.  Gastrointestinal: Negative for abdominal pain.  Skin: Negative for rash.  Neurological: Negative for dizziness, sensory change, loss of consciousness, weakness and headaches.  Endo/Heme/Allergies: Negative for polydipsia. Does not bruise/bleed easily.  Psychiatric/Behavioral: Negative for memory loss. The patient does not have insomnia.   All other systems reviewed and are negative.    Observations/Objective: Alert and oriented- answers all questions appropriately No distress    Assessment and Plan: Carolyn Erickson in today with chief complaint of Flank Pain   1. Right flank pain Will schedule  gall bladder US Avoid spicy and fatty foods. Keep diary of pain     Follow Up Instructions: prn    I discussed the assessment and treatment plan with the patient. The patient was provided an opportunity to ask questions and all were answered. The patient agreed with the plan and demonstrated an understanding of the  instructions.   The patient was advised to call back or seek an in-person evaluation if the symptoms worsen or if the condition fails to improve as anticipated.  The above assessment and management plan was discussed with the patient. The patient verbalized understanding of and has agreed to the management plan. Patient is aware to call the clinic if symptoms persist or worsen. Patient is aware when to return to the clinic for a follow-up visit. Patient educated on when it is appropriate to go to the emergency department.   Time call ended:  10:16  I provided 16 minutes of non-face-to-face time during this encounter.    Mary-Margaret Hassell Done, FNP

## 2020-03-04 ENCOUNTER — Telehealth: Payer: Self-pay | Admitting: Family Medicine

## 2020-03-04 NOTE — Chronic Care Management (AMB) (Signed)
  Chronic Care Management   Outreach Note  03/04/2020 Name: Carolyn Erickson MRN: [id number] DOB: [birth year]  Carolyn Erickson is a 74 y.o. year old female who is a primary care patient of Dettinger, Fransisca Kaufmann, MD. I reached out to Carolyn Erickson by phone today in response to a referral sent by Carolyn Erickson's health plan.     An unsuccessful telephone outreach was attempted today. The patient was referred to the case management team for assistance with care management and care coordination.   Follow Up Plan: A HIPPA compliant phone message was left for the patient providing contact information and requesting a return call.  The care management team will reach out to the patient again over the next 7 days.  If patient returns call to provider office, please advise to call [facility] at [place], [address], [place], [place] [address] Direct Dial: [phone] [email or web address] Website: [email or web address]

## 2020-03-05 ENCOUNTER — Ambulatory Visit (HOSPITAL_COMMUNITY)
Admission: RE | Admit: 2020-03-05 | Discharge: 2020-03-05 | Disposition: A | Discharge status: Home / Self Care | Payer: PPO | Source: Ambulatory Visit | Attending: Nurse Practitioner | Admitting: Nurse Practitioner

## 2020-03-05 ENCOUNTER — Other Ambulatory Visit: Payer: Self-pay

## 2020-03-05 DIAGNOSIS — R109 Unspecified abdominal pain: Secondary | ICD-10-CM | POA: Insufficient documentation

## 2020-03-05 DIAGNOSIS — K7689 Other specified diseases of liver: Secondary | ICD-10-CM | POA: Diagnosis not present

## 2020-03-11 NOTE — Chronic Care Management (AMB) (Signed)
  Chronic Care Management   Outreach Note  03/11/2020 Name: Carolyn Erickson MRN: [id number] DOB: 02-19-1946  Carolyn Erickson is a 74 y.o. year old female who is a primary care patient of Dettinger, Fransisca Kaufmann, MD. I reached out to [name] by phone today in response to a referral sent by Ms. Wallie Char Quizon's health plan.     A second unsuccessful telephone outreach was attempted today. The patient was referred to the case management team for assistance with care management and care coordination.   Follow Up Plan: A HIPPA compliant phone message was left for the patient providing contact information and requesting a return call.  The care management team will reach out to the patient again over the next 7 days.  If patient returns call to provider office, please advise to call [facility]  at [place], [address], [place], [place] [address] Direct Dial: [phone] [email or web address]: [email or web address]

## 2020-03-15 NOTE — Chronic Care Management (AMB) (Signed)
  Chronic Care Management   Note  03/15/2020 Name: NAJAT OLAZABAL MRN: [id number] DOB: [birth year]  ANABETH CHILCOTT is a 74 y.o. year old female who is a primary care patient of Dettinger, Fransisca Kaufmann, MD. I reached out to [name] by phone today in response to a referral sent by Ms. Wallie Char Biasi's health plan.     Ms. Raimondi was given information about Chronic Care Management services today including:  1. CCM service includes personalized support from designated clinical staff supervised by her physician, including individualized plan of care and coordination with other care providers 2. 24/7 contact phone numbers for assistance for urgent and routine care needs. 3. Service will only be billed when office clinical staff spend 20 minutes or more in a month to coordinate care. 4. Only one practitioner may furnish and bill the service in a calendar month. 5. The patient may stop CCM services at any time (effective at the end of the month) by phone call to the office staff. 6. The patient will be responsible for cost sharing (co-pay) of up to 20% of the service fee (after annual deductible is met).  Patient agreed to services and verbal consent obtained.   Follow up plan: Telephone appointment with care management team member scheduled for:03/24/2020  Noreene Larsson, [address], [place], [place] [address] Direct Dial: [phone] [email or web address] Website: [email or web address]

## 2020-03-24 ENCOUNTER — Ambulatory Visit (INDEPENDENT_AMBULATORY_CARE_PROVIDER_SITE_OTHER): Payer: PPO | Admitting: Licensed Clinical Social Worker

## 2020-03-24 DIAGNOSIS — I10 Essential (primary) hypertension: Secondary | ICD-10-CM | POA: Diagnosis not present

## 2020-03-24 DIAGNOSIS — E782 Mixed hyperlipidemia: Secondary | ICD-10-CM

## 2020-03-24 DIAGNOSIS — F339 Major depressive disorder, recurrent, unspecified: Secondary | ICD-10-CM | POA: Diagnosis not present

## 2020-03-24 DIAGNOSIS — M5136 Other intervertebral disc degeneration, lumbar region: Secondary | ICD-10-CM

## 2020-03-24 NOTE — Chronic Care Management (AMB) (Signed)
  Chronic Care Management    Clinical Social Work Follow Up Note  03/24/2020 Name: YANARA SCHENK MRN: [id number] DOB: [birth year]  DORSA OBENAUER is a 74 y.o. year old female who is a primary care patient of Dettinger, Fransisca Kaufmann, MD. The CCM team was consulted for assistance with [employer] .   Review of patient status, including review of consultants reports, other relevant assessments, and collaboration with appropriate care team members and the patient's provider was performed as part of comprehensive patient evaluation and provision of chronic care management services.    SDOH (Social Determinants of Health) assessments performed: Yes; risk for depression ; risk for stress  SDOH Interventions     Most Recent Value  SDOH Interventions  Depression Interventions/Treatment   Medication        Chronic Care Management from 03/24/2020 in [facility]  PHQ-9 Total Score  7     GAD 7 : Generalized Anxiety Score 03/24/2020  Nervous, Anxious, on Edge 1  Control/stop worrying 1  Worry too much - different things 1  Trouble relaxing 0  Restless 0  Easily annoyed or irritable 1  Afraid - awful might happen 1  Total GAD 7 Score 5  Anxiety Difficulty Somewhat difficult    Outpatient Encounter Medications as of 03/24/2020  Medication Sig  . amLODipine (NORVASC) 5 MG tablet Take 1 tablet (5 mg total) by mouth daily.  [name] buPROPion (WELLBUTRIN) 100 MG tablet Take 1 tablet (100 mg total) by mouth 2 (two) times daily.  . meloxicam (MOBIC) 7.5 MG tablet Take 1 tablet (7.5 mg total) by mouth daily.  . simvastatin (ZOCOR) 40 MG tablet Take 1 tablet (40 mg total) by mouth daily.  [name] venlafaxine (EFFEXOR) 37.5 MG tablet Take 1 tablet (37.5 mg total) by mouth 2 (two) times daily.   No facility-administered encounter medications on file as of 03/24/2020.     Goals Addressed            This Visit's Progress   . Client will talk with LCSW in next 30 days about  depression symptoms management for client (pt-stated)       [place] (see longitudinal plan of care for additional care plan information)  Current Barriers:  . Pain issues in client with Chronic Diagnoses of Depression, HLD, HTN, DDD . Depression symptoms  Clinical Social Work Clinical Goal(s):  [name] LCSW will talk with client in next 30 days about depression symptoms  management  Interventions: . Talked with client about CCM program services  . Talked with client about ambulation challenges of client . Talked with client about appetite decrease of client . Talked with Mattigan about hearing challenges (she said she uses 2 hearing aids) . Talked with Audianna about support system (spouse) . Talked with Lakshmi about transport needs of client . Talked with Remo Lipps about depression symptoms management . Provided counseling support for client  Patient Self Care Activities:   Takes medications as prescribed Does ADLs independently  Patient Self Care Deficits:  . Pain issues  Initial goal documentation       Follow Up Plan: LCSW to call client in next 4 weeks to talk with her about depression symptoms management for client  [name].Lulamae Skorupski MSW, LCSW Licensed Clinical Social Worker [facility] Care Management [phone]

## 2020-03-24 NOTE — Patient Instructions (Addendum)
Licensed Clinical Social Worker Visit Information  Goals we discussed today:  Goals Addressed            This Visit's Progress   . Client will talk with LCSW in next 30 days about depression symptoms management for client (pt-stated)       Carolyn Erickson (see longitudinal plan of care for additional care plan information)  Current Barriers:  . Pain issues in client with Chronic Diagnoses of Depression, HLD, HTN, DDD . Depression symptoms  Clinical Social Work Clinical Goal(s):  [name] LCSW will talk with client in next 30 days about depression symptoms  management  Interventions:  . Talked with client about CCM program services  . Talked with client about ambulation challenges of client . Talked with client about appetite decrease of client . Talked with Katlyn about hearing challenges (she said she uses 2 hearing aids) . Talked with Niayla about support system (spouse) . Talked with Kynnadi about transport needs of client . Talked with Remo Lipps about depression symptoms management . Provided counseling support for client  Patient Self Care Activities:   Takes medications as prescribed Does ADLs independently  Patient Self Care Deficits:  . Pain issues  Initial goal documentation       Materials Provided: No  Follow Up Plan: LCSW to call client in next 4 weeks to talk with her about depression symptoms management for client  The patient verbalized understanding of instructions provided today and declined a print copy of patient instruction materials.   Carolyn Erickson.Carolyn Erickson MSW, LCSW Licensed Clinical Social Worker [facility] [phone]

## 2020-04-23 ENCOUNTER — Ambulatory Visit (INDEPENDENT_AMBULATORY_CARE_PROVIDER_SITE_OTHER): Payer: PPO | Admitting: Licensed Clinical Social Worker

## 2020-04-23 DIAGNOSIS — E782 Mixed hyperlipidemia: Secondary | ICD-10-CM | POA: Diagnosis not present

## 2020-04-23 DIAGNOSIS — I10 Essential (primary) hypertension: Secondary | ICD-10-CM | POA: Diagnosis not present

## 2020-04-23 DIAGNOSIS — M5136 Other intervertebral disc degeneration, lumbar region: Secondary | ICD-10-CM

## 2020-04-23 DIAGNOSIS — F339 Major depressive disorder, recurrent, unspecified: Secondary | ICD-10-CM | POA: Diagnosis not present

## 2020-04-23 DIAGNOSIS — M51369 Other intervertebral disc degeneration, lumbar region without mention of lumbar back pain or lower extremity pain: Secondary | ICD-10-CM

## 2020-04-23 NOTE — Patient Instructions (Addendum)
Licensed Clinical Social Worker Visit Information  Goals we discussed today:  Goals    . Client will talk with LCSW in next 30 days about depression symptoms management for client (pt-stated)     CARE PLAN ENTRY  Current Barriers:  . Pain issues in client with Chronic Diagnoses of Depression, HLD, HTN, DDD . Depression symptoms  Clinical Social Work Clinical Goal(s):  [name] LCSW will talk with client in next 30 days about depression symptoms  management  Interventions:  Talked with client about CCM program services   Talked with client about ambulation challenges of client (has walker to use as needed)  Talked with Remo Lipps about hearing challenges (she said she uses 2 hearing aids)  Talked with Elaf about support system (spouse)  Talked with Traeh about transport needs of client  Talked with Azirah about depression symptoms management  Talked with Remo Lipps about DME of client (she has 3 in 1 [facility])  Talked with Remo Lipps about decreased energy  Talked with Alegria about her mood (she said she thinks her prescribed medications are working well and feels that mood is stable)  Talked with client about her upcoming client medical appointments  Patient Self Care Activities:   Takes medications as prescribed Does ADLs independently  Patient Self Care Deficits:  . Pain issues  Initial goal documentation      Materials Provided: No  Follow Up Plan: LCSW to call client in next 4 weeks to talk with her about depression symptoms management for client  The patient verbalized understanding of instructions provided today and declined a print copy of patient instruction materials.   [name].Shigeru Lampert MSW, LCSW Licensed Clinical Social Worker [facility] [phone]

## 2020-04-23 NOTE — Chronic Care Management (AMB) (Signed)
  Chronic Care Management    Clinical Social Work Follow Up Note  04/23/2020 Name: Carolyn Erickson MRN: [id number] DOB: February 24, 1946  Carolyn Erickson is a 74 y.o. year old female who is a primary care patient of Dettinger, Fransisca Kaufmann, MD. The CCM team was consulted for assistance with [employer] .   Review of patient status, including review of consultants reports, other relevant assessments, and collaboration with appropriate care team members and the patient's provider was performed as part of comprehensive patient evaluation and provision of chronic care management services.    SDOH (Social Determinants of Health) assessments performed: Yes;risk for tobacco use; risk of depression    Chronic Care Management from 03/24/2020 in [facility]  PHQ-9 Total Score  7     GAD 7 : Generalized Anxiety Score 03/24/2020  Nervous, Anxious, on Edge 1  Control/stop worrying 1  Worry too much - different things 1  Trouble relaxing 0  Restless 0  Easily annoyed or irritable 1  Afraid - awful might happen 1  Total GAD 7 Score 5  Anxiety Difficulty Somewhat difficult    Outpatient Encounter Medications as of 04/23/2020  Medication Sig  . amLODipine (NORVASC) 5 MG tablet Take 1 tablet (5 mg total) by mouth daily.  [name] buPROPion (WELLBUTRIN) 100 MG tablet Take 1 tablet (100 mg total) by mouth 2 (two) times daily.  . meloxicam (MOBIC) 7.5 MG tablet Take 1 tablet (7.5 mg total) by mouth daily.  . simvastatin (ZOCOR) 40 MG tablet Take 1 tablet (40 mg total) by mouth daily.  [name] venlafaxine (EFFEXOR) 37.5 MG tablet Take 1 tablet (37.5 mg total) by mouth 2 (two) times daily.   No facility-administered encounter medications on file as of 04/23/2020.    Goals    . Client will talk with LCSW in next 30 days about depression symptoms management for client (pt-stated)     CARE PLAN ENTRY   Current Barriers:  . Pain issues in client with Chronic Diagnoses of Depression, HLD, HTN,  DDD . Depression symptoms  Clinical Social Work Clinical Goal(s):  [name] LCSW will talk with client in next 30 days about depression symptoms  management  Interventions: . Talked with client about CCM program services  . Talked with client about ambulation challenges of client (has walker to use as needed) . Talked with Analisa about hearing challenges (she said she uses 2 hearing aids) . Talked with Mariyam about support system (spouse) . Talked with Ellenora about transport needs of client . Talked with Remo Lipps about depression symptoms management . Talked with Remo Lipps about DME of client (she has 3 in 1 [facility]) . Talked with Joretta about decreased energy . Talked with Shalise about her mood (she said she thinks her prescribed medications are working well and feels that mood is stable) . Talked with client about her upcoming client medical appointments  Patient Self Care Activities:   Takes medications as prescribed Does ADLs independently  Patient Self Care Deficits:  . Pain issues  Initial goal documentation       Follow Up Plan: LCSW to call client in next 4 weeks to talk with her about depression symptoms management for client  [name].Zineb Glade MSW, LCSW Licensed Clinical Social Worker [facility] Care [facility] [phone]

## 2020-05-25 ENCOUNTER — Ambulatory Visit: Payer: PPO

## 2020-05-28 ENCOUNTER — Telehealth: Payer: Self-pay

## 2020-05-29 ENCOUNTER — Other Ambulatory Visit: Payer: Self-pay | Admitting: Family Medicine

## 2020-05-29 DIAGNOSIS — M5030 Other cervical disc degeneration, unspecified cervical region: Secondary | ICD-10-CM

## 2020-05-29 DIAGNOSIS — M5136 Other intervertebral disc degeneration, lumbar region: Secondary | ICD-10-CM

## 2020-06-18 ENCOUNTER — Ambulatory Visit (INDEPENDENT_AMBULATORY_CARE_PROVIDER_SITE_OTHER): Payer: PPO | Admitting: Licensed Clinical Social Worker

## 2020-06-18 DIAGNOSIS — I10 Essential (primary) hypertension: Secondary | ICD-10-CM | POA: Diagnosis not present

## 2020-06-18 DIAGNOSIS — F339 Major depressive disorder, recurrent, unspecified: Secondary | ICD-10-CM

## 2020-06-18 DIAGNOSIS — E782 Mixed hyperlipidemia: Secondary | ICD-10-CM

## 2020-06-18 DIAGNOSIS — M5136 Other intervertebral disc degeneration, lumbar region: Secondary | ICD-10-CM

## 2020-06-18 NOTE — Chronic Care Management (AMB) (Signed)
Chronic Care Management    Clinical Social Work Follow Up Note  06/18/2020 Name: DASHAWNA DELBRIDGE MRN: [id number] DOB: 1946-04-21  TURNER KUNZMAN is a 74 y.o. year old female who is a primary care patient of Dettinger, Fransisca Kaufmann, MD. The CCM team was consulted for assistance with [employer] .   Review of patient status, including review of consultants reports, other relevant assessments, and collaboration with appropriate care team members and the patient's provider was performed as part of comprehensive patient evaluation and provision of chronic care management services.    SDOH (Social Determinants of Health) assessments performed: No;risk for tobacco use; risk for depression; risk for stress    Chronic Care Management from 03/24/2020 in [place]  PHQ-9 Total Score 7     GAD 7 : Generalized Anxiety Score 03/24/2020  Nervous, Anxious, on Edge 1  Control/stop worrying 1  Worry too much - different things 1  Trouble relaxing 0  Restless 0  Easily annoyed or irritable 1  Afraid - awful might happen 1  Total GAD 7 Score 5  Anxiety Difficulty Somewhat difficult    Outpatient Encounter Medications as of 06/18/2020  Medication Sig  . amLODipine (NORVASC) 5 MG tablet Take 1 tablet (5 mg total) by mouth daily.  [name] buPROPion (WELLBUTRIN) 100 MG tablet Take 1 tablet (100 mg total) by mouth 2 (two) times daily.  . meloxicam (MOBIC) 7.5 MG tablet Take 1 tablet by mouth once daily  . simvastatin (ZOCOR) 40 MG tablet Take 1 tablet (40 mg total) by mouth daily.  [name] venlafaxine (EFFEXOR) 37.5 MG tablet Take 1 tablet (37.5 mg total) by mouth 2 (two) times daily.   No facility-administered encounter medications on file as of 06/18/2020.     Goals    .  Client will talk with LCSW in next 30 days about depression symptoms management for client (pt-stated)      CARE PLAN ENTRY   Current Barriers:  . Pain issues in client with Chronic Diagnoses of Depression, HLD,  HTN, DDD . Depression symptoms  Clinical Social Work Clinical Goal(s):  [name] LCSW will talk with client in next 30 days about depression symptoms  management  Interventions:   . Talked with client about CCM program services  . Talked with client about ambulation challenges of client . Talked with client about appetite decrease of client . Talked with Jaysa about hearing challenges (she said she uses 2 hearing aids) . Talked with Zyriah about support system (spouse) . Talked with Denajah about transport needs of client . Talked with Remo Lipps about depression symptoms management . Provided counseling support for client . Talked with client  about vision of client . Talked with client about upcoming client medical appointments . Talked with client about her balance issues (she said she sometimes is not steady when walking) . Talked with client about health needs of her sister and of her brother . Talked with client about relaxation techniques of client (likes to walk or watch TV) . Talked with client about meal provision of client . Talked with client about support from her children (children call her to check on her status)   Patient Self Care Activities:   Takes medications as prescribed Does ADLs independently  Patient Self Care Deficits:  . Pain issues  Initial goal documentation       Follow Up Plan: LCSW to call client in next 4 weeks to talk with her about depression symptoms management for client  [name].Buddy Loeffelholz MSW, LCSW Licensed Clinical Social Worker [facility] [phone]

## 2020-06-18 NOTE — Patient Instructions (Addendum)
Licensed Clinical Education officer, museum Visit Information  Goals we discussed today:    Client will talk with LCSW in next 30 days about depression symptoms management for client (pt-stated)           CARE PLAN ENTRY   Current Barriers:   Pain issues in client with Chronic Diagnoses of Depression, HLD, HTN, DDD  Depression symptoms  Clinical Social Work Clinical Goal(s):   LCSW will talk with client in next 30 days about depression symptoms  management  Interventions:    Talked with client about CCM program services   Talked with client about ambulation challenges of client  Talked with client about appetite decrease of client  Talked with Earlena about hearing challenges (she said she uses 2 hearing aids)  Talked with Sinahi about support system (spouse)  Talked with Blimi about transport needs of client  Talked with Remo Lipps about depression symptoms management  Provided counseling support for client  Talked with client  about vision of client  Talked with client about upcoming client medical appointments  Talked with client about her balance issues (she said she sometimes is not steady when walking)  Talked with client about health needs of her sister and of her brother  Talked with client about relaxation techniques of client (likes to walk or watch TV)  Talked with client about meal provision of client  Talked with client about support from her children (children call her to check on her status)   Patient Self Care Activities:   Takes medications as prescribed Does ADLs independently  Patient Self Care Deficits:   Pain issues  Initial goal documentation       Follow Up Plan: LCSW to call client in next 4 weeks to talk with her about depression symptoms management for client  Materials Provided: No  The patient verbalized understanding of instructions provided today and declined a print copy of patient instruction materials.   [name].Beatrice Ziehm  MSW, LCSW Licensed Clinical Social Worker [facility] [phone]

## 2020-06-28 ENCOUNTER — Ambulatory Visit (INDEPENDENT_AMBULATORY_CARE_PROVIDER_SITE_OTHER): Payer: PPO

## 2020-06-28 DIAGNOSIS — Z0000 Encounter for general adult medical examination without abnormal findings: Secondary | ICD-10-CM | POA: Diagnosis not present

## 2020-06-28 NOTE — Patient Instructions (Signed)
  [facility] Maintenance Summary and Written Plan of Care  [name] ,  Thank you for allowing me to perform your Medicare Annual Wellness Visit and for your ongoing commitment to your health.   Health Maintenance & Immunization History Health Maintenance  Topic Date Due  . TETANUS/TDAP  11/25/2020 (Originally 03/13/1965)  . Hepatitis C Screening  11/25/2020 (Originally 01-Apr-1946)  . INFLUENZA VACCINE  07/04/2020  . DEXA SCAN  Completed  . COVID-19 Vaccine  Completed  . PNA vac Low Risk Adult  Completed   Immunization History  Administered Date(s) Administered  . Influenza, High Dose Seasonal PF 09/19/2018  . Influenza,inj,Quad PF,6+ Mos 09/05/2018, 09/26/2019  . Janssen (J&J) SARS-COV-2 Vaccination 04/05/2020  . Pneumococcal Conjugate-13 10/09/2014  . Pneumococcal Polysaccharide-23 05/28/2018  . Zoster 06/13/2015    These are the patient goals that we discussed: Goals Addressed            This Visit's Progress   . Prevent falls          This is a list of Health Maintenance Items that are overdue or due now: There are no preventive care reminders to display for this patient.   Orders/Referrals Placed Today: No orders of the defined types were placed in this encounter.  (Contact our referral department at [phone] if you have not spoken with someone about your referral appointment within the next 5 days)    Follow-up Plan  Scheduled with Dr. Warrick Parisian 07/28/2020 at 10:25am.

## 2020-06-28 NOTE — Progress Notes (Signed)
MEDICARE ANNUAL WELLNESS VISIT  06/28/2020  Telephone Visit Disclaimer This Medicare AWV was conducted by telephone due to national recommendations for restrictions regarding the COVID-19 Pandemic (e.g. social distancing).  I verified, using two identifiers, that I am speaking with Carolyn Erickson or their authorized healthcare agent. I discussed the limitations, risks, security, and privacy concerns of performing an evaluation and management service by telephone and the potential availability of an in-person appointment in the future. The patient expressed understanding and agreed to proceed.   Subjective:  Carolyn Erickson is a 74 y.o. female patient of Dettinger, Fransisca Kaufmann, MD who had a Medicare Annual Wellness Visit today via telephone. Daenerys is Disabled and lives with their spouse. she has three children. she reports that she is socially active and does interact with friends/family regularly. she is not physically active.  Patient Care Team: Dettinger, Fransisca Kaufmann, MD as PCP - General (Family Medicine) Shea Evans, [name], LCSW as Social Worker (Licensed Clinical Social Worker)  Advanced Directives 06/28/2020  Does Patient Have a Medical Advance Directive? No;Yes  Type of Advance Directive Living will  Does patient want to make changes to medical advance directive? No - Patient declined  Would patient like information on creating a medical advance directive? No - Patient declined    Hospital Utilization Over the Past 12 Months: # of hospitalizations or ER visits: 0 # of surgeries: 0  Review of Systems    Patient reports that her overall health is unchanged compared to last year.    Patient Reported Readings (BP, Pulse, CBG, Weight, etc) none  Pain Assessment Pain : 0-10 Pain Score: 5  Pain Type: Chronic pain Pain Location: Back Pain Orientation: Lower Pain Radiating Towards: Shoulders, neck, right knee Pain Descriptors / Indicators: Discomfort Pain Onset: More than a  month ago Pain Frequency: Constant     Current Medications & Allergies (verified) Allergies as of 06/28/2020   No Known Allergies     Medication List       Accurate as of June 28, 2020  2:09 PM. If you have any questions, ask your nurse or doctor.        amLODipine 5 MG tablet Commonly known as: NORVASC Take 1 tablet (5 mg total) by mouth daily.   buPROPion 100 MG tablet Commonly known as: WELLBUTRIN Take 1 tablet (100 mg total) by mouth 2 (two) times daily.   meloxicam 7.5 MG tablet Commonly known as: MOBIC Take 1 tablet by mouth once daily   simvastatin 40 MG tablet Commonly known as: ZOCOR Take 1 tablet (40 mg total) by mouth daily.   venlafaxine 37.5 MG tablet Commonly known as: EFFEXOR Take 1 tablet (37.5 mg total) by mouth 2 (two) times daily.       History (reviewed): Past Medical History:  Diagnosis Date  . Cancer [facility]) 2001   breast left  . Depression   . Hypertension    Past Surgical History:  Procedure Laterality Date  . ABDOMINAL HYSTERECTOMY    . BACK SURGERY     Family History  Problem Relation Age of Onset  . Diabetes Mother   . Heart disease Mother   . Alzheimer's disease Father   . Parkinson's disease Sister   . Alzheimer's disease Brother    Social History   Socioeconomic History  . Marital status: Married    Spouse name: Not on file  . Number of children: Not on file  . Years of education: Not on file  .  Highest education level: Not on file  Occupational History  . Not on file  Tobacco Use  . Smoking status: Never Smoker  . Smokeless tobacco: Never Used  Vaping Use  . Vaping Use: Never used  Substance and Sexual Activity  . Alcohol use: Never  . Drug use: Never  . Sexual activity: Not on file    Comment: married for 57 years  Other Topics Concern  . Not on file  Social History Narrative   Has 3 children, 6 grandkids   Social Determinants of Health   Financial Resource Strain:   . Difficulty of Paying Living  Expenses:   Food Insecurity:   . Worried About Charity fundraiser in the Last Year:   . Arboriculturist in the Last Year:   Transportation Needs:   . Film/video editor (Medical):   [name] Lack of Transportation (Non-Medical):   Physical Activity:   . Days of Exercise per Week:   . Minutes of Exercise per Session:   Stress:   . Feeling of Stress :   Social Connections:   . Frequency of Communication with Friends and Family:   . Frequency of Social Gatherings with Friends and Family:   . Attends Religious Services:   . Active Member of Clubs or Organizations:   . Attends Archivist Meetings:   [name] Marital Status:     Activities of Daily Living In your present state of health, do you have any difficulty performing the following activities: 06/28/2020 06/28/2020  Hearing? - N  Vision? - N  Difficulty concentrating or making decisions? - N  Walking or climbing stairs? - N  Dressing or bathing? - N  Doing errands, shopping? - N  Conservation officer, nature and eating ? - N  Using the Toilet? - N  In the past six months, have you accidently leaked urine? Y N  Do you have problems with loss of bowel control? - N  Managing your Medications? - N  Managing your Finances? - N  Housekeeping or managing your Housekeeping? - N  Some recent data might be hidden    Patient Education/ Literacy How often do you need to have someone help you when you read instructions, pamphlets, or other written materials from your doctor or pharmacy?: 3 - Sometimes What is the last grade level you completed in school?: 11th grade  Exercise Current Exercise Habits: The patient does not participate in regular exercise at present, Exercise limited by: neurologic condition(s);psychological condition(s)  Diet Patient reports consuming 2 meals a day and 2 snack(s) a day Patient reports that her primary diet is: Regular Patient reports that she does have regular access to food.   Depression Screen PHQ 2/9 Scores  06/28/2020 03/24/2020 01/28/2020 11/26/2019 06/20/2019 05/29/2019  PHQ - 2 Score 2 2 0 5 0 0  PHQ- 9 Score 5 7 - 8 - -     Fall Risk Fall Risk  06/28/2020 01/28/2020 11/26/2019 06/20/2019 05/29/2019  Falls in the past year? 1 0 0 1 1  Number falls in past yr: 1 - - 1 1  Injury with Fall? 1 - - 0 0  Risk for fall due to : Impaired balance/gait - - - -  Follow up Falls evaluation completed - - - -     Objective:  Carolyn Erickson seemed alert and oriented and she participated appropriately during our telephone visit.  Blood Pressure Weight BMI  BP Readings from Last 3 Encounters:  01/28/20 133/78  11/26/19 (!) 164/85  06/20/19 (!) 151/84   Wt Readings from Last 3 Encounters:  01/28/20 122 lb 6.4 oz (55.5 kg)  11/26/19 123 lb 9.6 oz (56.1 kg)  06/20/19 126 lb (57.2 kg)   BMI Readings from Last 1 Encounters:  01/28/20 23.13 kg/m    *Unable to obtain current vital signs, weight, and BMI due to telephone visit type  Hearing/Vision  . Kawanda did not seem to have difficulty with hearing/understanding during the telephone conversation . Reports that she has had a formal eye exam by an eye care professional within the past year . Reports that she has not had a formal hearing evaluation within the past year *Unable to fully assess hearing and vision during telephone visit type  Cognitive Function: 6CIT Screen 06/28/2020  What Year? 0 points  What month? 0 points  What time? 0 points  Count back from 20 0 points  Months in reverse 0 points  Repeat phrase 4 points  Total Score 4   (Normal:0-7, Significant for Dysfunction: >8)  Normal Cognitive Function Screening: Yes   Immunization & Health Maintenance Record Immunization History  Administered Date(s) Administered  . Influenza, High Dose Seasonal PF 09/19/2018  . Influenza,inj,Quad PF,6+ Mos 09/05/2018, 09/26/2019  . Janssen (J&J) SARS-COV-2 Vaccination 04/05/2020  . Pneumococcal Conjugate-13 10/09/2014  . Pneumococcal  Polysaccharide-23 05/28/2018  . Zoster 06/13/2015    Health Maintenance  Topic Date Due  . TETANUS/TDAP  11/25/2020 (Originally 03[birth year]13/1965)  . Hepatitis C Screening  11/25/2020 (Originally 1946-07-15)  . INFLUENZA VACCINE  07/04/2020  . DEXA SCAN  Completed  . COVID-19 Vaccine  Completed  . PNA vac Low Risk Adult  Completed       Assessment  This is a routine wellness examination for SEREN CHALOUX.  Health Maintenance: Due or Overdue There are no preventive care reminders to display for this patient.  Carolyn Erickson does not need a referral for Community Assistance: Care Management:   no Social Work:    no Prescription Assistance:  no Nutrition/Diabetes Education:  no   Plan:  Personalized Goals Goals Addressed            This Visit's Progress   . Prevent falls        Personalized Health Maintenance & Screening Recommendations    Lung Cancer Screening Recommended: no (Low Dose CT Chest recommended if Age 30-80 years, 30 pack-year currently smoking OR have quit w/in past 15 years) Hepatitis C Screening recommended: no HIV Screening recommended: no  Advanced Directives: Written information was not prepared per patient's request.  Referrals & Orders No orders of the defined types were placed in this encounter.   Follow-up Plan . Follow-up with Dettinger, Fransisca Kaufmann, MD as planned . Schedule 07/28/2020   I have personally reviewed and noted the following in the patient's chart:   . Medical and social history . Use of alcohol, tobacco or illicit drugs  . Current medications and supplements . Functional ability and status . Nutritional status . Physical activity . Advanced directives . List of other physicians . Hospitalizations, surgeries, and ER visits in previous 12 months . Vitals . Screenings to include cognitive, depression, and falls . Referrals and appointments  In addition, I have reviewed and discussed with Carolyn Erickson certain preventive  protocols, quality metrics, and best practice recommendations. A written personalized care plan for preventive services as well as general preventive health recommendations is available and can be mailed to the patient at her request.  Maud Deed Ricardo Kayes,LPN  [phone]

## 2020-07-01 ENCOUNTER — Ambulatory Visit: Payer: PPO | Admitting: Family

## 2020-07-01 DIAGNOSIS — R0981 Nasal congestion: Secondary | ICD-10-CM | POA: Diagnosis not present

## 2020-07-01 DIAGNOSIS — R0989 Other specified symptoms and signs involving the circulatory and respiratory systems: Secondary | ICD-10-CM | POA: Diagnosis not present

## 2020-07-01 DIAGNOSIS — J3489 Other specified disorders of nose and nasal sinuses: Secondary | ICD-10-CM | POA: Diagnosis not present

## 2020-07-01 DIAGNOSIS — R05 Cough: Secondary | ICD-10-CM | POA: Diagnosis not present

## 2020-07-01 DIAGNOSIS — J0100 Acute maxillary sinusitis, unspecified: Secondary | ICD-10-CM | POA: Diagnosis not present

## 2020-07-09 ENCOUNTER — Telehealth: Payer: Self-pay | Admitting: *Deleted

## 2020-07-09 DIAGNOSIS — H40033 Anatomical narrow angle, bilateral: Secondary | ICD-10-CM | POA: Diagnosis not present

## 2020-07-09 DIAGNOSIS — H2513 Age-related nuclear cataract, bilateral: Secondary | ICD-10-CM | POA: Diagnosis not present

## 2020-07-09 NOTE — Chronic Care Management (AMB) (Signed)
  Chronic Care Management   Note  07/09/2020 Name: Carolyn Erickson MRN: [id number] DOB: 05/17/1946  Carolyn Erickson is a 74 y.o. year old female who is a primary care patient of Dettinger, Fransisca Kaufmann, MD and is actively engaged with the care management team. I reached out to [name] by phone today to assist with re-scheduling a follow up visit with the Licensed Clinical Social Worker  Follow up plan: Unsuccessful telephone outreach attempt made. A HIPPA compliant phone message was left for the patient providing contact information and requesting a return call.  The care management team will reach out to the patient again over the next 7 days.  If patient returns call to provider office, please advise to call [place] at [phone].  [place], [place] [address] Direct Dial: [phone] [email or web address] Website: [email or web address]

## 2020-07-16 NOTE — Chronic Care Management (AMB) (Signed)
  Care Management   Note  07/16/2020 Name: [name] MRN: [id number] DOB: Nov 26, 1946  Carolyn Erickson is a 74 y.o. year old female who is a primary care patient of Dettinger, Fransisca Kaufmann, MD and is actively engaged with the care management team. I reached out to [name] by phone today to assist with re-scheduling a follow up visit with the Licensed Clinical Social Worker  Follow up plan: Unsuccessful telephone outreach attempt made. A HIPPA compliant phone message was left for the patient providing contact information and requesting a return call.  The care management team will reach out to the patient again over the next 7 days.  If patient returns call to provider office, please advise to call [place] at [phone].  [place], [place] [address] Direct Dial: [phone] [email or web address] Website: [email or web address]

## 2020-07-22 ENCOUNTER — Telehealth: Payer: PPO

## 2020-07-26 NOTE — Chronic Care Management (AMB) (Signed)
°  Care Management   Note  07/26/2020 Name: [name] MRN: [id number] DOB: March 28, 1946  Carolyn Erickson is a 74 y.o. year old female who is a primary care patient of Dettinger, Fransisca Kaufmann, MD and is actively engaged with the care management team. I reached out to [name] by phone today to assist with re-scheduling a follow up visit with the Licensed Clinical Social Worker  Follow up plan: Telephone appointment with care management team member scheduled for: 08/13/2020  [place], [place] [facility]  [place], [place] [address] Direct Dial: [email or web address]: [email or web address]

## 2020-07-28 ENCOUNTER — Encounter: Payer: Self-pay | Admitting: Family Medicine

## 2020-07-28 ENCOUNTER — Other Ambulatory Visit: Payer: Self-pay

## 2020-07-28 ENCOUNTER — Ambulatory Visit (INDEPENDENT_AMBULATORY_CARE_PROVIDER_SITE_OTHER): Payer: PPO | Admitting: Family Medicine

## 2020-07-28 ENCOUNTER — Telehealth: Payer: Self-pay | Admitting: Family Medicine

## 2020-07-28 VITALS — BP 113/76 | HR 81 | Temp 98.3°F | Ht 61.0 in | Wt 118.0 lb

## 2020-07-28 DIAGNOSIS — E782 Mixed hyperlipidemia: Secondary | ICD-10-CM

## 2020-07-28 DIAGNOSIS — M5136 Other intervertebral disc degeneration, lumbar region: Secondary | ICD-10-CM | POA: Diagnosis not present

## 2020-07-28 DIAGNOSIS — Z1159 Encounter for screening for other viral diseases: Secondary | ICD-10-CM

## 2020-07-28 DIAGNOSIS — M5030 Other cervical disc degeneration, unspecified cervical region: Secondary | ICD-10-CM

## 2020-07-28 DIAGNOSIS — F339 Major depressive disorder, recurrent, unspecified: Secondary | ICD-10-CM | POA: Diagnosis not present

## 2020-07-28 DIAGNOSIS — I10 Essential (primary) hypertension: Secondary | ICD-10-CM

## 2020-07-28 MED ORDER — MELOXICAM 7.5 MG PO TABS: 7.5000 mg | ORAL_TABLET | Freq: Every day | ORAL | 3 refills | Status: DC

## 2020-07-28 MED ORDER — SIMVASTATIN 40 MG PO TABS: 40.0000 mg | ORAL_TABLET | Freq: Every day | ORAL | 3 refills | Status: DC

## 2020-07-28 MED ORDER — VENLAFAXINE HCL 37.5 MG PO TABS: 37.5000 mg | ORAL_TABLET | Freq: Two times a day (BID) | ORAL | 3 refills | Status: DC

## 2020-07-28 MED ORDER — AMLODIPINE BESYLATE 5 MG PO TABS: 5.0000 mg | ORAL_TABLET | Freq: Every day | ORAL | 3 refills | Status: DC

## 2020-07-28 MED ORDER — BUPROPION HCL 100 MG PO TABS: 100.0000 mg | ORAL_TABLET | Freq: Two times a day (BID) | ORAL | 3 refills | Status: DC

## 2020-07-28 NOTE — Progress Notes (Signed)
BP 113/76   Pulse 81   Temp 98.3 F (36.8 C)   Ht 5' 1"  (1.549 m)   Wt 118 lb (53.5 kg)   SpO2 99%   BMI 22.30 kg/m    Subjective:   Patient ID: Carolyn Erickson, female    DOB: 1946/11/24, 74 y.o.   MRN: [id number]  HPI: Carolyn Erickson is a 74 y.o. female presenting on 07/28/2020 for Medical Management of Chronic Issues and Hypertension   HPI Hypertension Patient is currently on amlodipine, and their blood pressure today is 113/76. Patient denies any lightheadedness or dizziness. Patient denies headaches, blurred vision, chest pains, shortness of breath, or weakness. Denies any side effects from medication and is content with current medication.   Hyperlipidemia Patient is coming in for recheck of his hyperlipidemia. The patient is currently taking simvastatin. They deny any issues with myalgias or history of liver damage from it. They deny any focal numbness or weakness or chest pain.   Patient is coming in for depression anxiety and mood affective disorder recheck. She currently takes Wellbutrin and venlafaxine and feels like they have been doing very well for her.  Patient denies any major anxiety although she does have some anxiety but says is about the same that it has been over the past 20 years and nothing has necessarily changed.  Patient denies any suicidal ideations or thoughts of hurting self. Depression screen [facility] 2/9 07/28/2020 06/28/2020 03/24/2020 01/28/2020 11/26/2019  Decreased Interest 1 1 1  0 2  Down, Depressed, Hopeless 1 1 1  0 3  PHQ - 2 Score 2 2 2  0 5  Altered sleeping 0 0 0 - 0  Tired, decreased energy 2 3 3  - 3  Change in appetite 1 0 1 - 0  Feeling bad or failure about yourself  0 0 0 - 0  Trouble concentrating 0 0 0 - 0  Moving slowly or fidgety/restless 0 0 1 - 0  Suicidal thoughts 0 0 0 - 0  PHQ-9 Score 5 5 7  - 8  Difficult doing work/chores - - Somewhat difficult - Not difficult at all     Relevant past medical, surgical, family and social history  reviewed and updated as indicated. Interim medical history since our last visit reviewed. Allergies and medications reviewed and updated.  Review of Systems  Constitutional: Negative for chills and fever.  Eyes: Negative for visual disturbance.  Respiratory: Negative for chest tightness and shortness of breath.   Cardiovascular: Negative for chest pain and leg swelling.  Musculoskeletal: Negative for back pain and gait problem.  Skin: Negative for rash.  Neurological: Negative for dizziness, light-headedness and headaches.  Psychiatric/Behavioral: Negative for agitation and behavioral problems.  All other systems reviewed and are negative.   Per HPI unless specifically indicated above   Allergies as of 07/28/2020   No Known Allergies     Medication List       Accurate as of July 28, 2020 10:51 AM. If you have any questions, ask your nurse or doctor.        amLODipine 5 MG tablet Commonly known as: NORVASC Take 1 tablet (5 mg total) by mouth daily.   buPROPion 100 MG tablet Commonly known as: WELLBUTRIN Take 1 tablet (100 mg total) by mouth 2 (two) times daily.   meloxicam 7.5 MG tablet Commonly known as: MOBIC Take 1 tablet (7.5 mg total) by mouth daily.   simvastatin 40 MG tablet Commonly known as: ZOCOR Take 1 tablet (40 mg  total) by mouth daily.   venlafaxine 37.5 MG tablet Commonly known as: EFFEXOR Take 1 tablet (37.5 mg total) by mouth 2 (two) times daily.        Objective:   BP 113/76   Pulse 81   Temp 98.3 F (36.8 C)   Ht 5\' 1"  (1.549 m)   Wt 118 lb (53.5 kg)   SpO2 99%   BMI 22.30 kg/m   Wt Readings from Last 3 Encounters:  07/28/20 118 lb (53.5 kg)  01/28/20 122 lb 6.4 oz (55.5 kg)  11/26/19 123 lb 9.6 oz (56.1 kg)    Physical Exam Vitals and nursing note reviewed.  Constitutional:      General: She is not in acute distress.    Appearance: She is well-developed. She is not diaphoretic.  Eyes:     Conjunctiva/sclera: Conjunctivae  normal.  Cardiovascular:     Rate and Rhythm: Normal rate and regular rhythm.     Heart sounds: Normal heart sounds. No murmur heard.   Pulmonary:     Effort: Pulmonary effort is normal. No respiratory distress.     Breath sounds: Normal breath sounds. No wheezing.  Musculoskeletal:        General: No tenderness. Normal range of motion.  Skin:    General: Skin is warm and dry.     Findings: No rash.  Neurological:     Mental Status: She is alert and oriented to person, place, and time.     Coordination: Coordination normal.  Psychiatric:        Behavior: Behavior normal.       Assessment & Plan:   Problem List Items Addressed This Visit      Cardiovascular and Mediastinum   Hypertension   Relevant Medications   amLODipine (NORVASC) 5 MG tablet   simvastatin (ZOCOR) 40 MG tablet   Other Relevant Orders   CBC with Differential/Platelet   CMP14+EGFR     Musculoskeletal and Integument   DDD (degenerative disc disease), lumbar   Relevant Medications   meloxicam (MOBIC) 7.5 MG tablet   Other cervical disc degeneration, unspecified cervical region   Relevant Medications   meloxicam (MOBIC) 7.5 MG tablet     Other   Depression, recurrent (HCC)   Relevant Medications   buPROPion (WELLBUTRIN) 100 MG tablet   venlafaxine (EFFEXOR) 37.5 MG tablet   Hyperlipidemia   Relevant Medications   amLODipine (NORVASC) 5 MG tablet   simvastatin (ZOCOR) 40 MG tablet   Other Relevant Orders   Lipid panel    Other Visit Diagnoses    Need for hepatitis C screening test    -  Primary   Relevant Orders   Hepatitis C antibody    Patient seems to be doing well enough on her medicine, we discussed anxiety and she does not want to change anything for now.  She is doing good on her blood pressure as well.  Continue current medicines.  Patient does want to switch her medicines to mail order but she did not know which mail order her insurance uses so she will call 11/28/19 back on  that.  Follow up plan: Return in about 6 months (around 01/28/2021), or if symptoms worsen or fail to improve, for Hypertension and hyperlipidemia and depression follow-up.  Counseling provided for all of the vaccine components Orders Placed This Encounter  Procedures  . CBC with Differential/Platelet  . CMP14+EGFR  . Lipid panel  . Hepatitis C antibody    01/30/2021, MD [facility] 07/28/2020, 10:51 AM

## 2020-07-28 NOTE — Telephone Encounter (Signed)
Pt states that she is wanting all her prescriptions to be transferred to [facility] order. Phone #: [phone]

## 2020-07-28 NOTE — Telephone Encounter (Signed)
Resent patient aware  

## 2020-07-29 LAB — CBC WITH DIFFERENTIAL/PLATELET
Basophils Absolute: 0.1 10*3/uL (ref 0.0–0.2)
Basos: 1 %
EOS (ABSOLUTE): 0.3 10*3/uL (ref 0.0–0.4)
Eos: 3 %
Hematocrit: 42.9 % (ref 34.0–46.6)
Hemoglobin: 14.8 g/dL (ref 11.1–15.9)
Immature Grans (Abs): 0 10*3/uL (ref 0.0–0.1)
Immature Granulocytes: 0 %
Lymphocytes Absolute: 2.5 10*3/uL (ref 0.7–3.1)
Lymphs: 29 %
MCH: 31.4 pg (ref 26.6–33.0)
MCHC: 34.5 g/dL (ref 31.5–35.7)
MCV: 91 fL (ref 79–97)
Monocytes Absolute: 1 10*3/uL — ABNORMAL HIGH (ref 0.1–0.9)
Monocytes: 11 %
Neutrophils Absolute: 4.9 10*3/uL (ref 1.4–7.0)
Neutrophils: 56 %
Platelets: 257 10*3/uL (ref 150–450)
RBC: 4.72 x10E6/uL (ref 3.77–5.28)
RDW: 11.7 % (ref 11.7–15.4)
WBC: 8.7 10*3/uL (ref 3.4–10.8)

## 2020-07-29 LAB — CMP14+EGFR
ALT: 24 IU/L (ref 0–32)
AST: 26 IU/L (ref 0–40)
Albumin/Globulin Ratio: 1.6 (ref 1.2–2.2)
Albumin: 4.3 g/dL (ref 3.7–4.7)
Alkaline Phosphatase: 72 IU/L (ref 48–121)
BUN/Creatinine Ratio: 18 (ref 12–28)
BUN: 20 mg/dL (ref 8–27)
Bilirubin Total: 0.4 mg/dL (ref 0.0–1.2)
CO2: 27 mmol/L (ref 20–29)
Calcium: 9.7 mg/dL (ref 8.7–10.3)
Chloride: 103 mmol/L (ref 96–106)
Creatinine, Ser: 1.13 mg/dL — ABNORMAL HIGH (ref 0.57–1.00)
GFR calc Af Amer: 55 mL/min/1.73 — ABNORMAL LOW
GFR calc non Af Amer: 47 mL/min/1.73 — ABNORMAL LOW
Globulin, Total: 2.7 g/dL (ref 1.5–4.5)
Glucose: 114 mg/dL — ABNORMAL HIGH (ref 65–99)
Potassium: 4.5 mmol/L (ref 3.5–5.2)
Sodium: 144 mmol/L (ref 134–144)
Total Protein: 7 g/dL (ref 6.0–8.5)

## 2020-07-29 LAB — LIPID PANEL
Chol/HDL Ratio: 2.3 ratio (ref 0.0–4.4)
Cholesterol, Total: 187 mg/dL (ref 100–199)
HDL: 82 mg/dL (ref >39)
LDL Chol Calc (NIH): 88 mg/dL (ref 0–99)
Triglycerides: 97 mg/dL (ref 0–149)
VLDL Cholesterol Cal: 17 mg/dL (ref 5–40)

## 2020-07-29 LAB — HEPATITIS C ANTIBODY: Hep C Virus Ab: 0.1 s/co ratio (ref 0.0–0.9)

## 2020-08-13 ENCOUNTER — Ambulatory Visit (INDEPENDENT_AMBULATORY_CARE_PROVIDER_SITE_OTHER): Payer: PPO | Admitting: Licensed Clinical Social Worker

## 2020-08-13 DIAGNOSIS — F339 Major depressive disorder, recurrent, unspecified: Secondary | ICD-10-CM | POA: Diagnosis not present

## 2020-08-13 DIAGNOSIS — M5136 Other intervertebral disc degeneration, lumbar region: Secondary | ICD-10-CM

## 2020-08-13 DIAGNOSIS — I10 Essential (primary) hypertension: Secondary | ICD-10-CM

## 2020-08-13 DIAGNOSIS — E782 Mixed hyperlipidemia: Secondary | ICD-10-CM

## 2020-08-13 NOTE — Patient Instructions (Addendum)
Licensed Clinical Social Worker Visit Information  Goals we discussed today:     .  Client will talk with LCSW in next 30 days about depression symptoms management for client (pt-stated)        CARE PLAN ENTRY   Current Barriers:   Pain issues in client with Chronic Diagnoses of Depression, HLD, HTN, DDD  Depression symptoms  Clinical Social Work Clinical Goal(s):   LCSW will talk with client in next 30 days about depression symptoms  management  Interventions:  Talked with spouse of client about CCM program services   Talked with spouse of client about ambulation challenges of client  Talked with spouse client about appetite of client  Talked with Safira previously about hearing challenges (she said she uses 2 hearing aids)  Talked with Joslyne previously  about support system (spouse)  Talked with spouse of client about transport needs of client  Talked with spouse of client about depression symptoms management for client  Talked with spouse of client about relaxation techniques of client (takes naps, watches TV, enjoys caring for her pets)  Talked with spouse of client about vision of client (wears glasses)  Talked with spouse of client about meal provision of client  Talked with spouse of client  about energy level of client  Encouraged client or spouse of client to call LCSW as needed to help with social work needs of client  Patient Self Care Activities:   Takes medications as prescribed Does ADLs independently  Patient Self Care Deficits:   Pain issues  Initial goal documentation      Follow Up Plan: LCSW to call client/spouse in next 4 weeks to talk with client/spouse about depression symptoms management for client  Materials Provided: No  The patient Carolyn Erickson, spouse of patient, verbalized understanding of instructions provided today and declined a print copy of patient instruction materials.   Carolyn Erickson MSW,  LCSW Licensed Clinical Social Worker [facility] [phone]

## 2020-08-13 NOTE — Chronic Care Management (AMB) (Signed)
Chronic Care Management    Clinical Social Work Follow Up Note  08/13/2020 Name: Carolyn Erickson MRN: [id number] DOB: [birth year]  Carolyn Erickson is a 74 y.o. year old female who is a primary care patient of Dettinger, Fransisca Kaufmann, MD. The CCM team was consulted for assistance with [employer] .   Review of patient status, including review of consultants reports, other relevant assessments, and collaboration with appropriate care team members and the patient's provider was performed as part of comprehensive patient evaluation and provision of chronic care management services.    SDOH (Social Determinants of Health) assessments performed: No;risk for tobacco use; risk for depression; risk for stress; risk for physical inactivity    Office Visit from 07/28/2020 in [place]  PHQ-9 Total Score 5     GAD 7 : Generalized Anxiety Score 07/28/2020 03/24/2020  Nervous, Anxious, on Edge 0 1  Control/stop worrying 0 1  Worry too much - different things 0 1  Trouble relaxing 0 0  Restless 0 0  Easily annoyed or irritable 0 1  Afraid - awful might happen 0 1  Total GAD 7 Score 0 5  Anxiety Difficulty - Somewhat difficult    Outpatient Encounter Medications as of 08/13/2020  Medication Sig  . amLODipine (NORVASC) 5 MG tablet Take 1 tablet (5 mg total) by mouth daily.  [name] buPROPion (WELLBUTRIN) 100 MG tablet Take 1 tablet (100 mg total) by mouth 2 (two) times daily.  . meloxicam (MOBIC) 7.5 MG tablet Take 1 tablet (7.5 mg total) by mouth daily.  . simvastatin (ZOCOR) 40 MG tablet Take 1 tablet (40 mg total) by mouth daily.  [name] venlafaxine (EFFEXOR) 37.5 MG tablet Take 1 tablet (37.5 mg total) by mouth 2 (two) times daily.   No facility-administered encounter medications on file as of 08/13/2020.    Goals    .  Client will talk with LCSW in next 30 days about depression symptoms management for client (pt-stated)      CARE PLAN ENTRY   Current Barriers:  . Pain  issues in client with Chronic Diagnoses of Depression, HLD, HTN, DDD . Depression symptoms  Clinical Social Work Clinical Goal(s):  [name] LCSW will talk with client in next 30 days about depression symptoms  management  Interventions: . Talked with spouse of client about CCM program services  . Talked with spouse of client about ambulation challenges of client . Talked with spouse client about appetite of client . Talked with Whittany previously about hearing challenges (she said she uses 2 hearing aids) . Talked with Yeny previously  about support system (spouse) . Talked with spouse of client about transport needs of client . Talked with spouse of client about depression symptoms management for client . Talked with spouse of client about relaxation techniques of client (takes naps, watches TV, enjoys caring for her pets) . Talked with spouse of client about vision of client (wears glasses) . Talked with spouse of client about meal provision of client . Talked with spouse of client  about energy level of client . Encouraged client or spouse of client to call LCSW as needed to help with social work needs of client  Patient Self Care Activities:   Takes medications as prescribed Does ADLs independently  Patient Self Care Deficits:  . Pain issues  Initial goal documentation      Follow Up Plan: LCSW to call client/spouse in next 4 weeks to talk with client/spouse about depression symptoms management for  client  [name].Cystal Shannahan MSW, LCSW Licensed Clinical Social Worker [facility] [phone]

## 2020-09-06 ENCOUNTER — Telehealth: Payer: Self-pay

## 2020-09-06 NOTE — Telephone Encounter (Signed)
Received a call from pharmacy about a drug interaction between simvastatin and amlodipine. States it could cause rhabdomyolysis.  Please advise

## 2020-09-07 MED ORDER — ATORVASTATIN CALCIUM 20 MG PO TABS: 20.0000 mg | ORAL_TABLET | Freq: Every day | ORAL | 3 refills | Status: DC

## 2020-09-07 NOTE — Telephone Encounter (Signed)
Change medication to atorvastatin, stop simvastatin and start atorvastatin as soon as you get

## 2020-09-07 NOTE — Telephone Encounter (Signed)
Patient aware.

## 2020-09-16 ENCOUNTER — Telehealth: Payer: PPO

## 2020-09-22 ENCOUNTER — Ambulatory Visit: Payer: PPO

## 2020-10-01 ENCOUNTER — Ambulatory Visit: Payer: PPO | Admitting: Licensed Clinical Social Worker

## 2020-10-01 DIAGNOSIS — I10 Essential (primary) hypertension: Secondary | ICD-10-CM

## 2020-10-01 DIAGNOSIS — E782 Mixed hyperlipidemia: Secondary | ICD-10-CM

## 2020-10-01 DIAGNOSIS — M5136 Other intervertebral disc degeneration, lumbar region: Secondary | ICD-10-CM

## 2020-10-01 DIAGNOSIS — F339 Major depressive disorder, recurrent, unspecified: Secondary | ICD-10-CM

## 2020-10-01 NOTE — Chronic Care Management (AMB) (Signed)
Chronic Care Management    Clinical Social Work Follow Up Note  10/01/2020 Name: Carolyn Erickson MRN: [id number] DOB: 11/23/1946  Carolyn Erickson is a 74 y.o. year old female who is a primary care patient of Dettinger, Fransisca Kaufmann, MD. The CCM team was consulted for assistance with [employer] .   Review of patient status, including review of consultants reports, other relevant assessments, and collaboration with appropriate care team members and the patient's provider was performed as part of comprehensive patient evaluation and provision of chronic care management services.    SDOH (Social Determinants of Health) assessments performed: No; risk for depression; risk for tobacco use; risk for stress; risk for physical inactivity    Office Visit from 07/28/2020 in [place]  PHQ-9 Total Score 5       GAD 7 : Generalized Anxiety Score 07/28/2020 03/24/2020  Nervous, Anxious, on Edge 0 1  Control/stop worrying 0 1  Worry too much - different things 0 1  Trouble relaxing 0 0  Restless 0 0  Easily annoyed or irritable 0 1  Afraid - awful might happen 0 1  Total GAD 7 Score 0 5  Anxiety Difficulty - Somewhat difficult    Outpatient Encounter Medications as of 10/01/2020  Medication Sig  . amLODipine (NORVASC) 5 MG tablet Take 1 tablet (5 mg total) by mouth daily.  [name] atorvastatin (LIPITOR) 20 MG tablet Take 1 tablet (20 mg total) by mouth daily.  [name] buPROPion (WELLBUTRIN) 100 MG tablet Take 1 tablet (100 mg total) by mouth 2 (two) times daily.  . meloxicam (MOBIC) 7.5 MG tablet Take 1 tablet (7.5 mg total) by mouth daily.  [name] venlafaxine (EFFEXOR) 37.5 MG tablet Take 1 tablet (37.5 mg total) by mouth 2 (two) times daily.   No facility-administered encounter medications on file as of 10/01/2020.    Goals    .  Client will talk with LCSW in next 30 days about depression symptoms management for client (pt-stated)      CARE PLAN ENTRY  Current Barriers:   . Pain issues in client with Chronic Diagnoses of Depression, HLD, HTN, DDD . Depression symptoms  Clinical Social Work Clinical Goal(s):  [name] LCSW will talk with client in next 30 days about depression symptoms  management  Interventions:  Talked with client about CCM program services   Talked with client about ambulation challenges of client  Talked with client about appetite of client  Talked with [name] about hearing challenges (she said she uses 2 hearing aids)  Talked with [name] about support system (spouse)  Talked with client about transport needs of client  Talked with client about depression symptoms management for client  Talked with client about relaxation techniques of client (takes naps, watches TV, enjoys caring for her pets)  Talked with client about vision of client (wears glasses)  Talked with client about meal provision of client  Talked with client  about energy level of client  Talked with client about health needs of her spouse  Encouraged client or spouse of client to call LCSW as needed to help with social work needs of client  Talked with client about health needs of client  Talked with client about pain issues of client  Talked with client about sleeping issues of client  Talked with client about her upcoming client appointments  Talked with client about family support    Patient Self Care Activities:   Takes medications as prescribed Does ADLs independently  Patient Self Care Deficits:  . Pain issues  Initial goal documentation       Follow Up Plan:  LCSW to call client/spouse in next 4 weeks to talk with client/spouse about depression symptoms management for client  [name] MSW, LCSW Licensed Clinical Social Worker [facility] [phone]

## 2020-10-01 NOTE — Patient Instructions (Addendum)
Licensed Clinical Education officer, museum Visit Information  Goals we discussed today:     Client will talk with LCSW in next 30 days about depression symptoms management for client (pt-stated)         CARE PLAN ENTRY  Current Barriers:   Pain issues in client with Chronic Diagnoses of Depression, HLD, HTN, DDD  Depression symptoms  Clinical Social Work Clinical Goal(s):   LCSW will talk with client in next 30 days about depression symptoms  management  Interventions:  Talked withclient about CCM program services   Talked with client about ambulation challenges of client  Talked withclient about appetite of client  Talked with Joanabout hearing challenges (she said she uses 2 hearing aids)  Talked with Joanabout support system (spouse)  Talked withclient about transport needs of client  Talked withclientabout depression symptoms managementfor client  Talked with client about relaxation techniques of client (takes naps, watches TV, enjoys caring for her pets)  Talked with client about vision of client (wears glasses)  Talked with client about meal provision of client  Talked with client about energy level of client  Talked with client about health needs of her spouse  Encouraged client or spouse of client to call LCSW as needed to help with social work needs of client  Talked with client about health needs of client  Talked with client about pain issues of client  Talked with client about sleeping issues of client  Talked with client about her upcoming client appointments  Talked with client about family support    Patient Self Care Activities:   Takes medications as prescribed Does ADLs independently  Patient Self Care Deficits:   Pain issues  Initial goal documentation       Follow Up Plan: LCSW to call client/spousein next 4 weeks to talk with client/spouseabout depression symptoms management for client  Materials Provided:  No  The patient verbalized understanding of instructions provided today and declined a print copy of patient instruction materials.   Carolyn Erickson.Carolyn Erickson MSW, LCSW Licensed Clinical Social Worker [facility] [phone]

## 2020-11-03 ENCOUNTER — Ambulatory Visit: Payer: PPO | Admitting: Licensed Clinical Social Worker

## 2020-11-03 DIAGNOSIS — I10 Essential (primary) hypertension: Secondary | ICD-10-CM

## 2020-11-03 DIAGNOSIS — F339 Major depressive disorder, recurrent, unspecified: Secondary | ICD-10-CM

## 2020-11-03 DIAGNOSIS — E782 Mixed hyperlipidemia: Secondary | ICD-10-CM

## 2020-11-03 DIAGNOSIS — M5136 Other intervertebral disc degeneration, lumbar region: Secondary | ICD-10-CM

## 2020-11-03 NOTE — Chronic Care Management (AMB) (Signed)
  Chronic Care Management    Clinical Social Work Follow Up Note  11/03/2020 Name: Carolyn Erickson MRN: [id number] DOB: 06-20-1946  Carolyn Erickson is a 74 y.o. year old female who is a primary care patient of Dettinger, Fransisca Kaufmann, MD. The CCM team was consulted for assistance with [employer] .   Review of patient status, including review of consultants reports, other relevant assessments, and collaboration with appropriate care team members and the patient's provider was performed as part of comprehensive patient evaluation and provision of chronic care management services.    SDOH (Social Determinants of Health) assessments performed: No; risk for depression; risk for tobacco use; risk for stress; risk for physical inactivity    Office Visit from 07/28/2020 in [place]  PHQ-9 Total Score 5     GAD 7 : Generalized Anxiety Score 07/28/2020 03/24/2020  Nervous, Anxious, on Edge 0 1  Control/stop worrying 0 1  Worry too much - different things 0 1  Trouble relaxing 0 0  Restless 0 0  Easily annoyed or irritable 0 1  Afraid - awful might happen 0 1  Total GAD 7 Score 0 5  Anxiety Difficulty - Somewhat difficult    Outpatient Encounter Medications as of 11/03/2020  Medication Sig  . amLODipine (NORVASC) 5 MG tablet Take 1 tablet (5 mg total) by mouth daily.  [name] atorvastatin (LIPITOR) 20 MG tablet Take 1 tablet (20 mg total) by mouth daily.  [name] buPROPion (WELLBUTRIN) 100 MG tablet Take 1 tablet (100 mg total) by mouth 2 (two) times daily.  . meloxicam (MOBIC) 7.5 MG tablet Take 1 tablet (7.5 mg total) by mouth daily.  [name] venlafaxine (EFFEXOR) 37.5 MG tablet Take 1 tablet (37.5 mg total) by mouth 2 (two) times daily.   No facility-administered encounter medications on file as of 11/03/2020.  .. Goals    .  Client will talk with LCSW in next 30 days about depression symptoms management for client (pt-stated)      [place] (see longitudinal plan of care  for additional care plan information)  Current Barriers:  . Pain issues in client with Chronic Diagnoses of Depression, HLD, HTN, DDD . Depression symptoms  Clinical Social Work Clinical Goal(s):  [name] LCSW will talk with client in next 30 days about depression symptoms  management  Interventions: . Talked with client about CCM program services  . Talked with client about ambulation challenges of client . Talked with Shelda about hearing challenges (she said she uses 2 hearing aids) . Talked with Keyshla about support system (spouse) . Talked with Paizlee about transport needs of client . Talked with Remo Lipps about depression symptoms management . Talked with client about health needs of her spouse . Talked with Symiah about home health support for her spouse . Talked with client about pain issues of client . Talked with client about her upcoming medical appointments . Talked with client about her medication procurement . Talked with client about vision of client  Patient Self Care Activities:   Takes medications as prescribed Does ADLs independently  Patient Self Care Deficits:  . Pain issues  Initial goal documentation     Follow Up Plan:  LCSW to call client/spousein next 4 weeks to talk with client/spouseabout depression symptoms management for client  [name].Srijan Givan MSW, LCSW Licensed Clinical Social Worker [facility] [phone]

## 2020-11-03 NOTE — Patient Instructions (Addendum)
Licensed Clinical Education officer, museum Visit Information  Goals we discussed today:   Client will talk with LCSW in next 30 days about depression symptoms management for client (pt-stated)         [place] (see longitudinal plan of care for additional care plan information)  Current Barriers:   Pain issues in client with Chronic Diagnoses of Depression, HLD, HTN, DDD  Depression symptoms  Clinical Social Work Clinical Goal(s):   LCSW will talk with client in next 30 days about depression symptoms  management  Interventions:  Talked with client about CCM program services   Talked with client about ambulation challenges of client  Talked with Skyla about hearing challenges (she said she uses 2 hearing aids)  Talked with Brailey about support system (spouse)  Talked with Sameeha about transport needs of client  Talked with Arretta about depression symptoms management  Talked with client about health needs of her spouse  Talked with Remo Lipps about home health support for her spouse  Talked with client about pain issues of client  Talked with client about her upcoming medical appointments  Talked with client about her medication procurement  Talked with client about vision of client  Patient Self Care Activities:   Takes medications as prescribed Does ADLs independently  Patient Self Care Deficits:   Pain issues  Initial goal documentation     Follow Up Plan:  LCSW to call client/spousein next 4 weeks to talk with client/spouseabout depression symptoms management for client  Materials Provided: No  The patient verbalized understanding of instructions provided today and declined a print copy of patient instruction materials.   [name].Artie Mcintyre MSW, LCSW Licensed Clinical Social Worker [facility] [phone]

## 2020-12-10 ENCOUNTER — Ambulatory Visit: Payer: Medicare HMO | Admitting: Licensed Clinical Social Worker

## 2020-12-10 DIAGNOSIS — E782 Mixed hyperlipidemia: Secondary | ICD-10-CM

## 2020-12-10 DIAGNOSIS — M5136 Other intervertebral disc degeneration, lumbar region: Secondary | ICD-10-CM

## 2020-12-10 DIAGNOSIS — I10 Essential (primary) hypertension: Secondary | ICD-10-CM

## 2020-12-10 DIAGNOSIS — F339 Major depressive disorder, recurrent, unspecified: Secondary | ICD-10-CM

## 2020-12-10 NOTE — Chronic Care Management (AMB) (Signed)
  Chronic Care Management    Clinical Social Work Follow Up Note  12/10/2020 Name: Carolyn Erickson MRN: [id number] DOB: 04-15-1946  Carolyn Erickson is a 75 y.o. year old female who is a primary care patient of Dettinger, Fransisca Kaufmann, MD. The CCM team was consulted for assistance with [employer] .   Review of patient status, including review of consultants reports, other relevant assessments, and collaboration with appropriate care team members and the patient's provider was performed as part of comprehensive patient evaluation and provision of chronic care management services.    SDOH (Social Determinants of Health) assessments performed: No; risk for depression;risk for tobacco use; risk for stress; risk for physical inactivity  [place] Visit from 07/28/2020 in [place]  PHQ-9 Total Score 5     GAD 7 : Generalized Anxiety Score 07/28/2020 03/24/2020  Nervous, Anxious, on Edge 0 1  Control/stop worrying 0 1  Worry too much - different things 0 1  Trouble relaxing 0 0  Restless 0 0  Easily annoyed or irritable 0 1  Afraid - awful might happen 0 1  Total GAD 7 Score 0 5  Anxiety Difficulty - Somewhat difficult    Outpatient Encounter Medications as of 12/10/2020  Medication Sig  . amLODipine (NORVASC) 5 MG tablet Take 1 tablet (5 mg total) by mouth daily.  [name] atorvastatin (LIPITOR) 20 MG tablet Take 1 tablet (20 mg total) by mouth daily.  [name] buPROPion (WELLBUTRIN) 100 MG tablet Take 1 tablet (100 mg total) by mouth 2 (two) times daily.  . meloxicam (MOBIC) 7.5 MG tablet Take 1 tablet (7.5 mg total) by mouth daily.  [name] venlafaxine (EFFEXOR) 37.5 MG tablet Take 1 tablet (37.5 mg total) by mouth 2 (two) times daily.   No facility-administered encounter medications on file as of 12/10/2020.    Goals    .  Client will talk with LCSW in next 30 days about depression symptoms management for client (pt-stated)      CARE PLAN ENTRY  Current Barriers:   . Pain issues in client with Chronic Diagnoses of Depression, HLD, HTN, DDD . Depression symptoms  Clinical Social Work Clinical Goal(s):  [name] LCSW will talk with client in next 30 days about depression symptoms  management  Interventions:  . Talked with client about CCM program services  . Talked with client about ambulation challenges of client . Talked with client about appetite of client . Talked with Emmarie about hearing challenges (she said she uses 2 hearing aids) . Talked with Keyshawna about support system (spouse) . Talked with Evarose about transport needs of client . Talked with Remo Lipps about depression symptoms management . Provided counseling support for client . Talked with client about energy level of client  Talked with client about meals provision  Talked with client about mood of client  Talked with client about her upcoming medical appointments  Patient Self Care Activities:   Takes medications as prescribed Does ADLs independently  Patient Self Care Deficits:  . Pain issues  Initial goal documentation     Follow Up Plan: LCSW to call client/spousein next 4 weeks to talk with client/spouseabout depression symptoms management for client  [name].Forrest MSW, LCSW Licensed Clinical Social Worker [facility] Care Management [phone]

## 2020-12-10 NOTE — Patient Instructions (Addendum)
Licensed Clinical Social Worker Visit Information  Goals we discussed today:  .  Client will talk with LCSW in next 30 days about depression symptoms management for client (pt-stated)        CARE PLAN ENTRY  Current Barriers:   Pain issues in client with Chronic Diagnoses of Depression, HLD, HTN, DDD  Depression symptoms  Clinical Social Work Clinical Goal(s):   LCSW will talk with client in next 30 days about depression symptoms  management  Interventions:   Talked with client about CCM program services   Talked with client about ambulation challenges of client  Talked with client about appetite of client  Talked with Gertie about hearing challenges (she said she uses 2 hearing aids)  Talked with Laurena about support system (spouse)  Talked with Leola about transport needs of client  Talked with Remo Lipps about depression symptoms management  Provided counseling support for client  Talked with client about energy level of client  Talked with client about meals provision  Talked with client about mood of client  Talked with client about her upcoming medical appointments  Patient Self Care Activities:   Takes medications as prescribed Does ADLs independently  Patient Self Care Deficits:   Pain issues  Initial goal documentation     Follow Up Plan: LCSW to call client/spousein next 4 weeks to talk with client/spouseabout depression symptoms management for client  Materials Provided: No  The patient verbalized understanding of instructions provided today and declined a print copy of patient instruction materials.   [name].Torey Reinard MSW, LCSW Licensed Clinical Social Worker [facility] Care Management [phone]

## 2020-12-23 ENCOUNTER — Other Ambulatory Visit: Payer: Self-pay | Admitting: *Deleted

## 2020-12-23 DIAGNOSIS — M5030 Other cervical disc degeneration, unspecified cervical region: Secondary | ICD-10-CM

## 2020-12-23 DIAGNOSIS — M5136 Other intervertebral disc degeneration, lumbar region: Secondary | ICD-10-CM

## 2020-12-23 DIAGNOSIS — I10 Essential (primary) hypertension: Secondary | ICD-10-CM

## 2020-12-23 MED ORDER — AMLODIPINE BESYLATE 5 MG PO TABS: 5.0000 mg | ORAL_TABLET | Freq: Every day | ORAL | 0 refills | Status: DC

## 2020-12-23 MED ORDER — MELOXICAM 7.5 MG PO TABS: 7.5000 mg | ORAL_TABLET | Freq: Every day | ORAL | 0 refills | Status: DC

## 2021-01-03 ENCOUNTER — Telehealth: Payer: Self-pay

## 2021-01-03 DIAGNOSIS — F339 Major depressive disorder, recurrent, unspecified: Secondary | ICD-10-CM

## 2021-01-03 MED ORDER — BUPROPION HCL 100 MG PO TABS: 100.0000 mg | ORAL_TABLET | Freq: Two times a day (BID) | ORAL | 2 refills | Status: DC

## 2021-01-03 MED ORDER — BUPROPION HCL 100 MG PO TABS: 100.0000 mg | ORAL_TABLET | Freq: Two times a day (BID) | ORAL | 0 refills | Status: DC

## 2021-01-03 NOTE — Telephone Encounter (Signed)
Pt is almost out, sent 1 mos supply to [facility] from [place] to [place], sent remaining refills to [facility]

## 2021-01-03 NOTE — Telephone Encounter (Signed)
  Prescription Request  01/03/2021  What is the name of the medication or equipment? Bupropion 100 mg - Needs a two week supply  Have you contacted your pharmacy to request a refill? (if applicable) Yes  Which pharmacy would you like this sent to? [facility]   Patient notified that their request is being sent to the clinical staff for review and that they should receive a response within 2 business days.

## 2021-01-04 ENCOUNTER — Other Ambulatory Visit: Payer: Self-pay | Admitting: *Deleted

## 2021-01-04 DIAGNOSIS — F339 Major depressive disorder, recurrent, unspecified: Secondary | ICD-10-CM

## 2021-01-04 MED ORDER — VENLAFAXINE HCL 37.5 MG PO TABS: 37.5000 mg | ORAL_TABLET | Freq: Two times a day (BID) | ORAL | 0 refills | Status: DC

## 2021-01-04 MED ORDER — BUPROPION HCL 100 MG PO TABS: 100.0000 mg | ORAL_TABLET | Freq: Two times a day (BID) | ORAL | 0 refills | Status: DC

## 2021-01-04 MED ORDER — ATORVASTATIN CALCIUM 20 MG PO TABS: 20.0000 mg | ORAL_TABLET | Freq: Every day | ORAL | 0 refills | Status: DC

## 2021-01-12 ENCOUNTER — Telehealth: Payer: Medicare HMO

## 2021-01-28 ENCOUNTER — Ambulatory Visit (INDEPENDENT_AMBULATORY_CARE_PROVIDER_SITE_OTHER): Payer: Medicare HMO | Admitting: Family Medicine

## 2021-01-28 ENCOUNTER — Other Ambulatory Visit: Payer: Self-pay

## 2021-01-28 ENCOUNTER — Encounter: Payer: Self-pay | Admitting: Family Medicine

## 2021-01-28 VITALS — BP 128/76 | HR 82 | Ht 61.0 in | Wt 122.0 lb

## 2021-01-28 DIAGNOSIS — F339 Major depressive disorder, recurrent, unspecified: Secondary | ICD-10-CM

## 2021-01-28 DIAGNOSIS — M5136 Other intervertebral disc degeneration, lumbar region: Secondary | ICD-10-CM

## 2021-01-28 DIAGNOSIS — M5030 Other cervical disc degeneration, unspecified cervical region: Secondary | ICD-10-CM

## 2021-01-28 DIAGNOSIS — I10 Essential (primary) hypertension: Secondary | ICD-10-CM

## 2021-01-28 DIAGNOSIS — E782 Mixed hyperlipidemia: Secondary | ICD-10-CM

## 2021-01-28 LAB — CBC WITH DIFFERENTIAL/PLATELET
Basophils Absolute: 0 10*3/uL (ref 0.0–0.2)
Basos: 0 %
EOS (ABSOLUTE): 0.4 10*3/uL (ref 0.0–0.4)
Eos: 4 %
Hematocrit: 44 % (ref 34.0–46.6)
Hemoglobin: 14.8 g/dL (ref 11.1–15.9)
Immature Grans (Abs): 0 10*3/uL (ref 0.0–0.1)
Immature Granulocytes: 0 %
Lymphocytes Absolute: 2.8 10*3/uL (ref 0.7–3.1)
Lymphs: 32 %
MCH: 30.9 pg (ref 26.6–33.0)
MCHC: 33.6 g/dL (ref 31.5–35.7)
MCV: 92 fL (ref 79–97)
Monocytes Absolute: 0.8 10*3/uL (ref 0.1–0.9)
Monocytes: 9 %
Neutrophils Absolute: 4.9 10*3/uL (ref 1.4–7.0)
Neutrophils: 55 %
Platelets: 212 10*3/uL (ref 150–450)
RBC: 4.79 x10E6/uL (ref 3.77–5.28)
RDW: 11.9 % (ref 11.7–15.4)
WBC: 8.9 10*3/uL (ref 3.4–10.8)

## 2021-01-28 MED ORDER — VENLAFAXINE HCL 37.5 MG PO TABS
37.5000 mg | ORAL_TABLET | Freq: Two times a day (BID) | ORAL | 3 refills | Status: DC
Start: 2021-01-28 — End: 2021-03-07

## 2021-01-28 MED ORDER — ATORVASTATIN CALCIUM 20 MG PO TABS: 20.0000 mg | ORAL_TABLET | Freq: Every day | ORAL | 3 refills | Status: DC

## 2021-01-28 MED ORDER — MELOXICAM 7.5 MG PO TABS: 7.5000 mg | ORAL_TABLET | Freq: Every day | ORAL | 3 refills | Status: DC

## 2021-01-28 MED ORDER — BUPROPION HCL 100 MG PO TABS: 100.0000 mg | ORAL_TABLET | Freq: Two times a day (BID) | ORAL | 3 refills | Status: DC

## 2021-01-28 MED ORDER — AMLODIPINE BESYLATE 5 MG PO TABS: 5.0000 mg | ORAL_TABLET | Freq: Every day | ORAL | 3 refills | Status: DC

## 2021-01-28 NOTE — Progress Notes (Signed)
BP 128/76   Pulse 82   Ht _0  (1.549 m)   Wt 122 lb (55.3 kg)   SpO2 100%   BMI 23.05 kg/m    Subjective:   Patient ID: Carolyn Erickson, female    DOB: 1946-07-31, 75 y.o.   MRN: [id number]  HPI: Carolyn Erickson is a 75 y.o. female presenting on 01/28/2021 for Medical Management of Chronic Issues, Hypertension, and Depression   HPI Hypertension Patient is currently on amlodipine, and their blood pressure today is 120/76. Patient denies any lightheadedness or dizziness. Patient denies headaches, blurred vision, chest pains, shortness of breath, or weakness. Denies any side effects from medication and is content with current medication.   Hyperlipidemia Patient is coming in for recheck of his hyperlipidemia. The patient is currently taking atorvastatin. They deny any issues with myalgias or history of liver damage from it. They deny any focal numbness or weakness or chest pain.   Depression recheck Patient has depression and takes Effexor Wellbutrin it has been a struggle a little bit recently because her husband health is still very bad but she is still doing okay with it.  She says the medication is fine is just more the situation.  She denies any suicidal ideations.  Degenerative disc disease with radiculopathy recheck Patient currently takes meloxicam for back pain.  Patient does complain of intermittent abdominal pain/pressure that comes and goes, she has not been currently when it comes she is less about 30 minutes then it passes she cannot pinpoint it to any part of her abdomen is that is different every time.  She says bowel movements and denies any nausea or vomiting or urinary issues.  Instructed to come in next and she has it.  Relevant past medical, surgical, family and social history reviewed and updated as indicated. Interim medical history since our last visit reviewed. Allergies and medications reviewed and updated.  Review of Systems  Constitutional: Negative for  chills and fever.  Eyes: Negative for visual disturbance.  Respiratory: Negative for chest tightness and shortness of breath.   Cardiovascular: Negative for chest pain and leg swelling.  Gastrointestinal: Positive for abdominal pain.  Genitourinary: Negative for difficulty urinating and dysuria.  Musculoskeletal: Positive for back pain. Negative for gait problem.  Skin: Negative for rash.  Neurological: Negative for light-headedness and headaches.  Psychiatric/Behavioral: Negative for agitation and behavioral problems.  All other systems reviewed and are negative.   Per HPI unless specifically indicated above   Allergies as of 01/28/2021   No Known Allergies     Medication List       Accurate as of January 28, 2021  8:48 AM. If you have any questions, ask your nurse or doctor.        amLODipine 5 MG tablet Commonly known as: NORVASC Take 1 tablet (5 mg total) by mouth daily.   atorvastatin 20 MG tablet Commonly known as: LIPITOR Take 1 tablet (20 mg total) by mouth daily.   buPROPion 100 MG tablet Commonly known as: WELLBUTRIN Take 1 tablet (100 mg total) by mouth 2 (two) times daily.   meloxicam 7.5 MG tablet Commonly known as: MOBIC Take 1 tablet (7.5 mg total) by mouth daily.   venlafaxine 37.5 MG tablet Commonly known as: EFFEXOR Take 1 tablet (37.5 mg total) by mouth 2 (two) times daily.        Objective:   BP 128/76   Pulse 82   Ht _1  (1.549 m)   Wt 122  lb (55.3 kg)   SpO2 100%   BMI 23.05 kg/m   Wt Readings from Last 3 Encounters:  01/28/21 122 lb (55.3 kg)  07/28/20 118 lb (53.5 kg)  01/28/20 122 lb 6.4 oz (55.5 kg)    Physical Exam Vitals and nursing note reviewed.  Constitutional:      General: She is not in acute distress.    Appearance: She is well-developed and well-nourished. She is not diaphoretic.  Eyes:     Extraocular Movements: EOM normal.     Conjunctiva/sclera: Conjunctivae normal.     Pupils: Pupils are equal, round,  and reactive to light.  Cardiovascular:     Rate and Rhythm: Normal rate and regular rhythm.     Pulses: Intact distal pulses.     Heart sounds: Normal heart sounds. No murmur heard.   Pulmonary:     Effort: Pulmonary effort is normal. No respiratory distress.     Breath sounds: Normal breath sounds. No wheezing.  Abdominal:     General: Abdomen is flat. Bowel sounds are normal. There is no distension.     Tenderness: There is no abdominal tenderness. There is no right CVA tenderness, left CVA tenderness, guarding or rebound.  Musculoskeletal:        General: No edema.  Skin:    General: Skin is warm and dry.     Findings: No rash.  Neurological:     Mental Status: She is alert and oriented to person, place, and time.     Coordination: Coordination normal.  Psychiatric:        Mood and Affect: Mood and affect normal.        Behavior: Behavior normal.       Assessment & Plan:   Problem List Items Addressed This Visit      Cardiovascular and Mediastinum   Essential hypertension   Relevant Medications   amLODipine (NORVASC) 5 MG tablet   atorvastatin (LIPITOR) 20 MG tablet   Other Relevant Orders   CBC with Differential/Platelet   CMP14+EGFR     Musculoskeletal and Integument   DDD (degenerative disc disease), lumbar   Relevant Medications   meloxicam (MOBIC) 7.5 MG tablet     Other   Depression, recurrent (HCC)   Relevant Medications   buPROPion (WELLBUTRIN) 100 MG tablet   venlafaxine (EFFEXOR) 37.5 MG tablet   Hyperlipidemia - Primary   Relevant Medications   amLODipine (NORVASC) 5 MG tablet   atorvastatin (LIPITOR) 20 MG tablet   Other Relevant Orders   Lipid panel    Other Visit Diagnoses    Other cervical disc degeneration, unspecified cervical region       Relevant Medications   meloxicam (MOBIC) 7.5 MG tablet      Continue to use meloxicam, if abdominal pain arises again please come in while you are in albumin so we can assess Recommended to  increase fiber and probiotics and hydration Follow up plan: Return in about 6 months (around 07/28/2021), or if symptoms worsen or fail to improve, for Hypertension hyperlipidemia.  Counseling provided for all of the vaccine components Orders Placed This Encounter  Procedures  . CBC with Differential/Platelet  . CMP14+EGFR  . Lipid panel    Caryl Pina, MD [facility] 01/28/2021, 8:48 AM

## 2021-01-29 LAB — CMP14+EGFR
ALT: 21 IU/L (ref 0–32)
AST: 19 IU/L (ref 0–40)
Albumin/Globulin Ratio: 2 (ref 1.2–2.2)
Albumin: 4.5 g/dL (ref 3.7–4.7)
Alkaline Phosphatase: 67 IU/L (ref 44–121)
BUN/Creatinine Ratio: 23 (ref 12–28)
BUN: 25 mg/dL (ref 8–27)
Bilirubin Total: 0.4 mg/dL (ref 0.0–1.2)
CO2: 24 mmol/L (ref 20–29)
Calcium: 10 mg/dL (ref 8.7–10.3)
Chloride: 103 mmol/L (ref 96–106)
Creatinine, Ser: 1.09 mg/dL — ABNORMAL HIGH (ref 0.57–1.00)
GFR calc Af Amer: 57 mL/min/{1.73_m2} — ABNORMAL LOW (ref >59)
GFR calc non Af Amer: 49 mL/min/{1.73_m2} — ABNORMAL LOW (ref >59)
Globulin, Total: 2.3 g/dL (ref 1.5–4.5)
Glucose: 100 mg/dL — ABNORMAL HIGH (ref 65–99)
Potassium: 4.3 mmol/L (ref 3.5–5.2)
Sodium: 143 mmol/L (ref 134–144)
Total Protein: 6.8 g/dL (ref 6.0–8.5)

## 2021-01-29 LAB — LIPID PANEL
Chol/HDL Ratio: 2 ratio (ref 0.0–4.4)
Cholesterol, Total: 191 mg/dL (ref 100–199)
HDL: 94 mg/dL (ref >39)
LDL Chol Calc (NIH): 84 mg/dL (ref 0–99)
Triglycerides: 74 mg/dL (ref 0–149)
VLDL Cholesterol Cal: 13 mg/dL (ref 5–40)

## 2021-02-08 DIAGNOSIS — S0990XA Unspecified injury of head, initial encounter: Secondary | ICD-10-CM | POA: Diagnosis not present

## 2021-02-08 DIAGNOSIS — R58 Hemorrhage, not elsewhere classified: Secondary | ICD-10-CM | POA: Diagnosis not present

## 2021-02-08 DIAGNOSIS — M4312 Spondylolisthesis, cervical region: Secondary | ICD-10-CM | POA: Diagnosis not present

## 2021-02-08 DIAGNOSIS — I10 Essential (primary) hypertension: Secondary | ICD-10-CM | POA: Diagnosis not present

## 2021-02-08 DIAGNOSIS — Z043 Encounter for examination and observation following other accident: Secondary | ICD-10-CM | POA: Diagnosis not present

## 2021-02-08 DIAGNOSIS — S020XXA Fracture of vault of skull, initial encounter for closed fracture: Secondary | ICD-10-CM | POA: Diagnosis not present

## 2021-02-08 DIAGNOSIS — M40202 Unspecified kyphosis, cervical region: Secondary | ICD-10-CM | POA: Diagnosis not present

## 2021-02-08 DIAGNOSIS — S0101XA Laceration without foreign body of scalp, initial encounter: Secondary | ICD-10-CM | POA: Diagnosis not present

## 2021-02-08 DIAGNOSIS — M47812 Spondylosis without myelopathy or radiculopathy, cervical region: Secondary | ICD-10-CM | POA: Diagnosis not present

## 2021-02-08 DIAGNOSIS — W19XXXA Unspecified fall, initial encounter: Secondary | ICD-10-CM | POA: Diagnosis not present

## 2021-02-08 DIAGNOSIS — S0240FA Zygomatic fracture, left side, initial encounter for closed fracture: Secondary | ICD-10-CM | POA: Diagnosis not present

## 2021-02-08 DIAGNOSIS — G319 Degenerative disease of nervous system, unspecified: Secondary | ICD-10-CM | POA: Diagnosis not present

## 2021-02-08 DIAGNOSIS — E041 Nontoxic single thyroid nodule: Secondary | ICD-10-CM | POA: Diagnosis not present

## 2021-02-08 DIAGNOSIS — S199XXA Unspecified injury of neck, initial encounter: Secondary | ICD-10-CM | POA: Diagnosis not present

## 2021-02-08 DIAGNOSIS — S06320A Contusion and laceration of left cerebrum without loss of consciousness, initial encounter: Secondary | ICD-10-CM | POA: Diagnosis not present

## 2021-02-10 ENCOUNTER — Ambulatory Visit (INDEPENDENT_AMBULATORY_CARE_PROVIDER_SITE_OTHER): Payer: Medicare HMO | Admitting: Family Medicine

## 2021-02-10 ENCOUNTER — Other Ambulatory Visit: Payer: Self-pay

## 2021-02-10 ENCOUNTER — Encounter: Payer: Self-pay | Admitting: Family Medicine

## 2021-02-10 VITALS — BP 149/82 | HR 72 | Temp 97.7°F | Ht 61.0 in | Wt 119.2 lb

## 2021-02-10 DIAGNOSIS — E041 Nontoxic single thyroid nodule: Secondary | ICD-10-CM | POA: Insufficient documentation

## 2021-02-10 DIAGNOSIS — W19XXXD Unspecified fall, subsequent encounter: Secondary | ICD-10-CM | POA: Diagnosis not present

## 2021-02-10 DIAGNOSIS — S0101XD Laceration without foreign body of scalp, subsequent encounter: Secondary | ICD-10-CM

## 2021-02-10 NOTE — Patient Instructions (Signed)
Nondisplaced fracture of left zygomatic arch

## 2021-02-10 NOTE — Progress Notes (Signed)
Assessment & Plan:  1. Laceration of scalp without complication, subsequent encounter Healing with no s/s of infection. Advised to return in a week for staple removal.   2. Fall, subsequent encounter Discussed installing a handrail from the house to the garage, removing loose rugs, and any other hazards that patient could trip and fall on.   3. Thyroid nodule New diagnosis. Needs work-up.  - Thyroid Panel With TSH - US THYROID; Future   Follow up plan: Return in about 1 week (around 02/17/2021) for staple removal.  Hendricks Limes, MSN, APRN, FNP-C [facility]  Subjective:   Patient ID: Carolyn Erickson, female    DOB: Sep 17, 1946, 75 y.o.   MRN: [id number]  HPI: Carolyn Erickson is a 75 y.o. female presenting on 02/10/2021 for ER Follow up (3/8 [facility]- head injury. Patient states she feels fine now. )  Patient is here for an ER follow-up from [facility] on 02/08/2021 after a fall.  She is accompanied by her son who she is okay with being present.  She states she does not remember anything from the day that she fell.  Her son states that she was going down the steps from the house to the garage, which is only 3 steps, when she missed one and landed on the cement garage floor.  She sustained a laceration to the left side of her head which has 3 staples in it.  Her CT showed no acute intracranial abnormality, a nondisplaced fracture of the left zygomatic arch, a nondepressed fracture of the left anterior parietal bone with overlying contusion and laceration, and a right thyroid nodule measuring 2.4 cm that recommended a thyroid ultrasound.  Patient reports she does sometimes feel dizzy but nothing significant.  She does report she feels weak and tired all of the time.  She feels she is her normal self. She has never had her thyroid level checked.  Son reports there is no handrail going from the house to the garage.  She does have a few loose rugs throughout the  house.   ROS: Negative unless specifically indicated above in HPI.   Relevant past medical history reviewed and updated as indicated.   Allergies and medications reviewed and updated.   Current Outpatient Medications:  .  amLODipine (NORVASC) 5 MG tablet, Take 1 tablet (5 mg total) by mouth daily., Disp: 90 tablet, Rfl: 3 .  atorvastatin (LIPITOR) 20 MG tablet, Take 1 tablet (20 mg total) by mouth daily., Disp: 90 tablet, Rfl: 3 .  buPROPion (WELLBUTRIN) 100 MG tablet, Take 1 tablet (100 mg total) by mouth 2 (two) times daily., Disp: 180 tablet, Rfl: 3 .  meloxicam (MOBIC) 7.5 MG tablet, Take 1 tablet (7.5 mg total) by mouth daily., Disp: 90 tablet, Rfl: 3 .  venlafaxine (EFFEXOR) 37.5 MG tablet, Take 1 tablet (37.5 mg total) by mouth 2 (two) times daily., Disp: 180 tablet, Rfl: 3  No Known Allergies  Objective:   BP (!) 149/82   Pulse 72   Temp 97.7 F (36.5 C) (Temporal)   Ht 5\' 1"  (1.549 m)   Wt 119 lb 3.2 oz (54.1 kg)   SpO2 100%   BMI 22.52 kg/m    Physical Exam Vitals reviewed.  Constitutional:      General: She is not in acute distress.    Appearance: Normal appearance. She is not ill-appearing, toxic-appearing or diaphoretic.  HENT:     Head: Normocephalic and atraumatic.  Eyes:  General: No scleral icterus.       Right eye: No discharge.        Left eye: No discharge.     Conjunctiva/sclera: Conjunctivae normal.  Cardiovascular:     Rate and Rhythm: Normal rate.  Pulmonary:     Effort: Pulmonary effort is normal. No respiratory distress.  Musculoskeletal:        General: Normal range of motion.     Cervical back: Normal range of motion.  Skin:    General: Skin is warm and dry.     Capillary Refill: Capillary refill takes less than 2 seconds.     Findings: Laceration (left side of scalp with 3 staples - no erythema, warmth, swelling or drainage) present.  Neurological:     General: No focal deficit present.     Mental Status: She is alert and  oriented to person, place, and time. Mental status is at baseline.  Psychiatric:        Mood and Affect: Mood normal.        Behavior: Behavior normal.        Thought Content: Thought content normal.        Judgment: Judgment normal.

## 2021-02-11 LAB — THYROID PANEL WITH TSH
Free Thyroxine Index: 2.4 (ref 1.2–4.9)
T3 Uptake Ratio: 26 % (ref 24–39)
T4, Total: 9.2 ug/dL (ref 4.5–12.0)
TSH: 3.39 u[IU]/mL (ref 0.450–4.500)

## 2021-02-14 ENCOUNTER — Ambulatory Visit (INDEPENDENT_AMBULATORY_CARE_PROVIDER_SITE_OTHER): Payer: Medicare HMO | Admitting: Licensed Clinical Social Worker

## 2021-02-14 DIAGNOSIS — F339 Major depressive disorder, recurrent, unspecified: Secondary | ICD-10-CM | POA: Diagnosis not present

## 2021-02-14 DIAGNOSIS — I10 Essential (primary) hypertension: Secondary | ICD-10-CM

## 2021-02-14 DIAGNOSIS — E782 Mixed hyperlipidemia: Secondary | ICD-10-CM

## 2021-02-14 DIAGNOSIS — M5136 Other intervertebral disc degeneration, lumbar region: Secondary | ICD-10-CM

## 2021-02-14 DIAGNOSIS — F39 Unspecified mood [affective] disorder: Secondary | ICD-10-CM

## 2021-02-14 NOTE — Chronic Care Management (AMB) (Signed)
Chronic Care Management    Clinical Social Work Note  02/14/2021 Name: Carolyn Erickson MRN: [id number] DOB: 1946-07-19  Carolyn Erickson is a 75 y.o. year old female who is a primary care patient of Dettinger, Fransisca Kaufmann, MD. The CCM team was consulted to assist the patient with chronic disease management and/or care coordination needs related to: Mental Health Counseling and Resources.   Engaged with patient by telephone for follow up visit in response to provider referral for social work chronic care management and care coordination services.   Consent to Services:  The patient was given information about Chronic Care Management services, agreed to services, and gave verbal consent prior to initiation of services.  Please see initial visit note for detailed documentation.   Patient agreed to services and consent obtained.   Assessment: Review of patient past medical history, allergies, medications, and health status, including review of relevant consultants reports was performed today as part of a comprehensive evaluation and provision of chronic care management and care coordination services.     SDOH (Social Determinants of Health) assessments and interventions performed:    Advanced Directives Status: See [employer] application for related entries.  CCM Care Plan  No Known Allergies  Outpatient Encounter Medications as of 02/14/2021  Medication Sig  . amLODipine (NORVASC) 5 MG tablet Take 1 tablet (5 mg total) by mouth daily.  [name] atorvastatin (LIPITOR) 20 MG tablet Take 1 tablet (20 mg total) by mouth daily.  [name] buPROPion (WELLBUTRIN) 100 MG tablet Take 1 tablet (100 mg total) by mouth 2 (two) times daily.  . meloxicam (MOBIC) 7.5 MG tablet Take 1 tablet (7.5 mg total) by mouth daily.  [name] venlafaxine (EFFEXOR) 37.5 MG tablet Take 1 tablet (37.5 mg total) by mouth 2 (two) times daily.   No facility-administered encounter medications on file as of 02/14/2021.    Patient Active Problem List    Diagnosis Date Noted  . Thyroid nodule 02/10/2021  . Affective disorder ([place]) 11/26/2019  . BMI 22.0-22.9, adult 11/26/2019  . Hx of adenomatous polyp of colon 11/26/2019  . Personal history of breast cancer 11/26/2019  . Skin irritation 11/26/2019  . DDD (degenerative disc disease), lumbar 06/20/2019  . Depression, recurrent ([place]) 05/29/2019  . Hyperlipidemia 05/29/2019  . Essential hypertension 05/29/2019  . Cervical radiculopathy 01/11/2018    Conditions to be addressed/monitored: ; Depression and coping strategies to manage depression issues faced  Care Plan : LCSW Care Plan  Updates made by Katha Cabal, LCSW since 02/14/2021 12:00 AM    Care Plan : Depression (Adult)  Updates made by Katha Cabal, LCSW since 02/14/2021 12:00 AM    Problem: Depression Identification (Depression)     Goal: Client will manage depression and depression symptoms faced over next 30 days   Start Date: 02/14/2021  Expected End Date: 05/17/2021  This Visit's Progress: On track  Priority: Medium  Note:   Current Barriers:  . Chronic Mental Health needs related to depression and depression management . Limited social support and Mental Health Concerns  . Suicidal Ideation/Homicidal Ideation: No  Clinical Social Work Goal(s):  . patient will work with SW monthly by telephone or in person to reduce or manage symptoms related to depression and depression management . patient will work with SW monthly to address concerns related to client completion of ADLs . Patient will communicate with RNCM or LCSW as needed for CCM support  Interventions:  1:1 collaboration with Dr. Warrick Parisian regarding development and update of comprehensive  plan of care as evidenced by provider attestation and co-signature Patient interviewed and appropriate assessments performed:  Provided patient with information about depression management and self care skills Motivational Interviewing Talked with client about  pain issues Talked with client about recent fall of client Talked with client about sleeping issues Talked with client about transport needs of client Talked with client about mood of client Talked with client about family support Talked with client about energy level of client Talked with client about ambulation of client (has a walker to use as needed) Talked with client about food procurement for client   Patient Self Care Activities:  . Self administers medications as prescribed . Attends all scheduled provider appointments . Performs ADL's independently  Patient Coping Strengths:  . Supportive Relationships . Family . Friends . Hopefulness  Patient Self Care Deficits:  . Lacks social connections . Transportation needs occasionally  Patient Goals:  - avoid negative self-talk - spend time or talk with others every day - practice relaxation or meditation daily - keep a calendar with appointment dates  Follow Up Plan: LCSW to call client on 03/17/2021 to assess client needs     [name].Forrest MSW, LCSW Licensed Clinical Social Worker [facility] Care Management [phone]

## 2021-02-14 NOTE — Patient Instructions (Signed)
Visit Information  PATIENT GOALS: Goals Addressed              This Visit's Progress   .  Manage Depression and Depression symptoms faced (pt-stated)        Timeframe:  Short-Term Goal Priority:  Medium Start Date:    02/14/2021                         Expected End Date:    05/17/2021                   Follow Up Date 03/17/2021    Manage My Emotions (Patient): needs help in managing depression and depression symptoms     Why is this important?    When you are stressed, down or upset, your body reacts too.   For example, your blood pressure may get higher; you may have a headache or stomachache.   When your emotions get the best of you, your body's ability to fight off cold and flu gets weak.   These steps will help you manage your emotions.    Patient Self Care Activities:  . Self administers medications as prescribed . Attends all scheduled provider appointments . Performs ADL's independently  Patient Coping Strengths:  . Supportive Relationships . Family . Friends . Hopefulness  Patient Self Care Deficits:  . Lacks social connections . Transportation needs occasionally  Patient Goals:  - avoid negative self-talk - spend time or talk with others every day - practice relaxation or meditation daily - keep a calendar with appointment dates   Follow Up Plan:  LCSW to call client on 03/17/2021 to assess client needs      Follow Up Plan:  LCSW to call client on 03/17/2021 to assess client needs at that time  [name].Skylah Delauter MSW, LCSW Licensed Clinical Social Worker [facility] Care Management [phone]

## 2021-02-16 ENCOUNTER — Ambulatory Visit (HOSPITAL_COMMUNITY)
Admission: RE | Admit: 2021-02-16 | Discharge: 2021-02-16 | Disposition: A | Discharge status: Home / Self Care | Payer: Medicare HMO | Source: Ambulatory Visit | Attending: Family Medicine | Admitting: Family Medicine

## 2021-02-16 ENCOUNTER — Other Ambulatory Visit: Payer: Self-pay

## 2021-02-16 DIAGNOSIS — E041 Nontoxic single thyroid nodule: Secondary | ICD-10-CM | POA: Insufficient documentation

## 2021-02-18 ENCOUNTER — Ambulatory Visit (INDEPENDENT_AMBULATORY_CARE_PROVIDER_SITE_OTHER): Payer: Medicare HMO | Admitting: Family

## 2021-02-18 ENCOUNTER — Encounter: Payer: Self-pay | Admitting: Family

## 2021-02-18 ENCOUNTER — Other Ambulatory Visit: Payer: Self-pay

## 2021-02-18 VITALS — BP 126/74 | HR 70 | Temp 99.0°F | Resp 20 | Ht 61.0 in | Wt 121.0 lb

## 2021-02-18 DIAGNOSIS — Z4802 Encounter for removal of sutures: Secondary | ICD-10-CM

## 2021-02-18 DIAGNOSIS — Z9181 History of falling: Secondary | ICD-10-CM | POA: Diagnosis not present

## 2021-02-18 DIAGNOSIS — S0101XD Laceration without foreign body of scalp, subsequent encounter: Secondary | ICD-10-CM

## 2021-02-18 DIAGNOSIS — Z09 Encounter for follow-up examination after completed treatment for conditions other than malignant neoplasm: Secondary | ICD-10-CM

## 2021-02-18 NOTE — Progress Notes (Signed)
   Subjective:    Patient ID: Carolyn Erickson, female    DOB: November 20, 1946, 75 y.o.   MRN: [id number]  Chief Complaint  Patient presents with  . Suture / Staple Removal    Head - placed at [facility]     HPI Pt presents to the office today for staple removal of her head and wound care. She states she fell in her garage on 02/08/21 and had three staples placed. She had a CT head that was negative for acute intracranial hemorrhage and a stable CT spine.   Review of Systems  Skin: Positive for wound.  All other systems reviewed and are negative.      Objective:   Physical Exam Vitals reviewed.  Constitutional:      General: She is not in acute distress.    Appearance: She is well-developed.  HENT:     Head: Normocephalic and atraumatic.     Right Ear: Tympanic membrane normal.     Left Ear: Tympanic membrane normal.  Eyes:     Pupils: Pupils are equal, round, and reactive to light.  Neck:     Thyroid: No thyromegaly.  Cardiovascular:     Rate and Rhythm: Normal rate and regular rhythm.     Heart sounds: Normal heart sounds. No murmur heard.   Pulmonary:     Effort: Pulmonary effort is normal. No respiratory distress.     Breath sounds: Normal breath sounds. No wheezing.  Abdominal:     General: Bowel sounds are normal. There is no distension.     Palpations: Abdomen is soft.     Tenderness: There is no abdominal tenderness.  Musculoskeletal:        General: No tenderness. Normal range of motion.     Cervical back: Normal range of motion and neck supple.  Skin:    General: Skin is warm and dry.     Findings: Laceration present.          Comments: Well approximated laceration. Three staples intact. No redness, discharge,   Neurological:     Mental Status: She is alert and oriented to person, place, and time.     Cranial Nerves: No cranial nerve deficit.     Deep Tendon Reflexes: Reflexes are normal and symmetric.  Psychiatric:        Behavior: Behavior normal.         Thought Content: Thought content normal.        Judgment: Judgment normal.    Three staples removed, patient tolerated well.   BP 126/74   Pulse 70   Temp 99 F (37.2 C)   Resp 20   Ht 5\' 1"  (1.549 m)   Wt 121 lb (54.9 kg)   SpO2 100%   BMI 22.86 kg/m '      Assessment & Plan:  Carolyn Erickson comes in today with chief complaint of Suture / Staple Removal (Head - placed at [facility] )   Diagnosis and orders addressed:  1. Encounter for staple removal  2. Hospital discharge follow-up  3. At risk for falling  Staples removed Report any redness, swelling, fever, discharge, or pain Fall risks discussed    Evelina Dun, FNP

## 2021-02-18 NOTE — Patient Instructions (Signed)
Wound Closure Removal, Care After This sheet gives you information about how to care for yourself after your stitches (sutures), staples, or skin adhesives have been removed. Your health care provider may also give you more specific instructions. If you have problems or questions, contact your health care provider. What can I expect after the procedure? After your sutures or staples have been removed or your skin adhesives have fallen off, it is common to have:  Some discomfort and swelling in the area.  Slight redness in the area. Follow these instructions at home: If you have a bandage:  Wash your hands with soap and water before you change your bandage (dressing). If soap and water are not available, use hand sanitizer.  Change your dressing as told by your health care provider. If your dressing becomes wet or dirty, or develops a bad smell, change it as soon as possible.  If your dressing sticks to your skin, pour warm, clean water over it until it loosens and can be removed without pulling apart the wound edges. Pat the area dry with a soft, clean towel. Do not rub the wound because that may cause bleeding. Wound care  Keep the wound area dry and clean.  Check your wound every day for signs of infection. Check for: ? Redness, swelling, or pain. ? Fluid or blood. ? Warmth. ? Pus or a bad smell.  Wash your hands with soap and water before and after touching your wound.  Apply cream or ointment only as told by your health care provider. If you are using cream or ointment, wash the area with soap and water 2 times a day to remove all the cream or ointment. Rinse off the soap and pat the area dry with a clean towel.  If skin glue or adhesive strips were applied after sutures or staples were removed, leave these closures in place until they peel off on their own. If adhesive strip edges start to loosen and curl up, you may trim the loose edges. Do not remove adhesive strips completely  unless your health care provider tells you to do that.  Continue to protect the wound from injury.  Do not pick at your wound. Picking can cause an infection.   Bathing  Do not take baths, swim, or use a hot tub until your health care provider approves.  Ask your health care provider when it is okay to shower.  Follow these steps for showering: ? If you have a dressing, remove it before getting into the shower. ? In the shower, allow soapy water to get on the wound. Avoid scrubbing the wound. ? When you get out of the shower, dry the wound by patting it with a clean towel. ? Reapply a dressing over the wound if needed. Scar care  When your wound has completely healed, take actions to help decrease the size of your scar: ? Wear sunscreen over the scar or cover it with clothing when you are outside. New scars get sunburned easily, which can make scarring worse. ? Gently massage the scarred area. This can decrease scar thickness. General instructions  Take over-the-counter and prescription medicines only as told by your health care provider.  Keep all follow-up visits as told by your health care provider. This is important. Contact a health care provider if:  You have redness, swelling, or pain around your wound.  You have fluid or blood coming from your wound.  Your wound feels warm to the touch.  You have  pus or a bad smell coming from your wound.  Your wound opens up.  You have chills. Get help right away if:  You have a fever.  You have redness that is spreading from your wound. Summary  Change your dressing as told by your health care provider. If your dressing becomes wet or dirty, or develops a bad smell, change it as soon as possible.  Check your wound every day for signs of infection.  Wash your hands with soap and water before and after touching your wound. This information is not intended to replace advice given to you by your health care provider. Make sure  you discuss any questions you have with your health care provider. Document Revised: 01/29/2020 Document Reviewed: 09/10/2017 Elsevier Patient Education  2021 [employer].

## 2021-03-07 ENCOUNTER — Other Ambulatory Visit: Payer: Self-pay | Admitting: Family Medicine

## 2021-03-07 DIAGNOSIS — M5030 Other cervical disc degeneration, unspecified cervical region: Secondary | ICD-10-CM

## 2021-03-07 DIAGNOSIS — M5136 Other intervertebral disc degeneration, lumbar region: Secondary | ICD-10-CM

## 2021-03-07 DIAGNOSIS — I10 Essential (primary) hypertension: Secondary | ICD-10-CM

## 2021-03-07 DIAGNOSIS — F339 Major depressive disorder, recurrent, unspecified: Secondary | ICD-10-CM

## 2021-03-17 ENCOUNTER — Ambulatory Visit (INDEPENDENT_AMBULATORY_CARE_PROVIDER_SITE_OTHER): Payer: Medicare HMO | Admitting: Licensed Clinical Social Worker

## 2021-03-17 DIAGNOSIS — F339 Major depressive disorder, recurrent, unspecified: Secondary | ICD-10-CM

## 2021-03-17 DIAGNOSIS — I10 Essential (primary) hypertension: Secondary | ICD-10-CM

## 2021-03-17 DIAGNOSIS — E782 Mixed hyperlipidemia: Secondary | ICD-10-CM

## 2021-03-17 DIAGNOSIS — M5136 Other intervertebral disc degeneration, lumbar region: Secondary | ICD-10-CM

## 2021-03-17 NOTE — Chronic Care Management (AMB) (Signed)
Chronic Care Management    Clinical Social Work Note  03/17/2021 Name: Carolyn Erickson MRN: [id number] DOB: [birth year]  Carolyn Erickson is a 75 y.o. year old female who is a primary care patient of Dettinger, Fransisca Kaufmann, MD. The CCM team was consulted to assist the patient with chronic disease management and/or care coordination needs related to: [employer] .   LCSW was not able to engage with patient by telephone today for follow up visit in response to provider referral for social work chronic care management and care coordination services.   Consent to Services:  The patient was given information about Chronic Care Management services, agreed to services, and gave verbal consent prior to initiation of services.  Please see initial visit note for detailed documentation.   Patient agreed to services and consent obtained.   Assessment: Review of patient past medical history, allergies, medications, and health status, including review of relevant consultants reports was performed today as part of a comprehensive evaluation and provision of chronic care management and care coordination services.     SDOH (Social Determinants of Health) assessments and interventions performed:  SDOH Interventions   Flowsheet Row Most Recent Value  SDOH Interventions   Depression Interventions/Treatment  Medication       Advanced Directives Status: See Vynca application for related entries.  CCM Care Plan  No Known Allergies  Outpatient Encounter Medications as of 03/17/2021  Medication Sig  . amLODipine (NORVASC) 5 MG tablet TAKE 1 TABLET EVERY DAY  . atorvastatin (LIPITOR) 20 MG tablet TAKE 1 TABLET (20 MG TOTAL) BY MOUTH DAILY.  [name] buPROPion (WELLBUTRIN) 100 MG tablet Take 1 tablet (100 mg total) by mouth 2 (two) times daily.  . meloxicam (MOBIC) 7.5 MG tablet TAKE 1 TABLET EVERY DAY  . venlafaxine (EFFEXOR) 37.5 MG tablet TAKE 1 TABLET TWICE DAILY   No facility-administered encounter  medications on file as of 03/17/2021.    Patient Active Problem List   Diagnosis Date Noted  . Thyroid nodule 02/10/2021  . Affective disorder ([place]) 11/26/2019  . BMI 22.0-22.9, adult 11/26/2019  . Hx of adenomatous polyp of colon 11/26/2019  . Personal history of breast cancer 11/26/2019  . Skin irritation 11/26/2019  . DDD (degenerative disc disease), lumbar 06/20/2019  . Depression, recurrent ([place]) 05/29/2019  . Hyperlipidemia 05/29/2019  . Essential hypertension 05/29/2019  . Cervical radiculopathy 01/11/2018    Conditions to be addressed/monitored: Monitor depression symptoms of client and client management of depression symptoms   Care Plan : Depression (Adult)  Updates made by Katha Cabal, LCSW since 03/17/2021 12:00 AM    Problem: Depression Identification (Depression)     Goal: Client will manage depression and depression symptoms faced over next 30 days   Start Date: 03/17/2021  Expected End Date: 06/16/2021  This Visit's Progress: On track  Recent Progress: On track  Priority: Medium  Note:   Current Barriers:  . Chronic Mental Health needs related to depression and depression management . Limited social support and Mental Health Concerns  . Suicidal Ideation/Homicidal Ideation: No  Clinical Social Work Goal(s):  . patient will work with SW monthly by telephone or in person to reduce or manage symptoms related to depression and depression management . patient will work with SW monthly to address concerns related to client completion of ADLs . Patient will communicate with RNCM or LCSW as needed for CCM support in next 30 days  Interventions:  1:1 collaboration with Dr. Warrick Parisian regarding development and update of  comprehensive plan of care as evidenced by provider attestation and co-signature Chart Reviewed to assess needs of client Called client home phone number several times today but LCSW was not able to speak via phone today with client or with spouse  of client.  LCSW did leave phone message for client today asking for Leitha to please return call to LCSW at [phone]   Patient Self Care Activities:  . Self administers medications as prescribed . Attends all scheduled provider appointments . Performs ADL's independently  Patient Coping Strengths:  . Family . Friends . Hopefulness  Patient Self Care Deficits:  . Lacks social connections . Transportation needs occasionally  Patient Goals:  - spend time or talk with others every day for next 30 day - practice relaxation or meditation daily for next 30 days - keep a calendar with appointment dates for next 30 days  Follow Up Plan: LCSW to call client on 04/25/21 to assess client needs     [name].Kashish Yglesias MSW, LCSW Licensed Clinical Social Worker [facility] Care Management [phone]

## 2021-03-17 NOTE — Patient Instructions (Signed)
Visit Information  PATIENT GOALS: Goals Addressed              This Visit's Progress   .  Manage Depression and Depression symptoms faced (pt-stated)        Timeframe:  Short-Term Goal Priority:  Medium Progress: On Track Start Date:    03/17/21                         Expected End Date:    06/16/21                Follow Up Date   04/25/21    Manage My Emotions (Patient): needs help in managing depression and depression symptoms     Why is this important?    When you are stressed, down or upset, your body reacts too.   For example, your blood pressure may get higher; you may have a headache or stomachache.   When your emotions get the best of you, your body's ability to fight off cold and flu gets weak.   These steps will help you manage your emotions.    Patient Self Care Activities:  . Self administers medications as prescribed . Attends all scheduled provider appointments . Performs ADL's independently  Patient Coping Strengths:  . Supportive Relationships . Family . Friends . Hopefulness  Patient Self Care Deficits:  . Lacks social connections . Transportation needs occasionally  Patient Goals:  - avoid negative self-talk - spend time or talk with others every day - practice relaxation or meditation daily - keep a calendar with appointment dates   Follow Up Plan:  LCSW to call client on 04/25/21 to assess client needs      [name].Kristel Durkee MSW, LCSW Licensed Clinical Social Worker [facility] Care Management [phone]

## 2021-04-12 ENCOUNTER — Telehealth: Payer: Self-pay | Admitting: Family Medicine

## 2021-04-12 NOTE — Telephone Encounter (Signed)
Patient is requesting a referral to urology.  She reports she is having problems with incontinence ongoing for quite a while and would like to be seen by a specialist.

## 2021-04-13 ENCOUNTER — Ambulatory Visit (INDEPENDENT_AMBULATORY_CARE_PROVIDER_SITE_OTHER): Payer: Medicare HMO | Admitting: Family Medicine

## 2021-04-13 ENCOUNTER — Encounter: Payer: Self-pay | Admitting: Family Medicine

## 2021-04-13 ENCOUNTER — Ambulatory Visit: Payer: Medicare HMO | Admitting: Family Medicine

## 2021-04-13 ENCOUNTER — Other Ambulatory Visit: Payer: Self-pay

## 2021-04-13 VITALS — BP 146/85 | HR 90 | Wt 117.0 lb

## 2021-04-13 DIAGNOSIS — R300 Dysuria: Secondary | ICD-10-CM | POA: Diagnosis not present

## 2021-04-13 DIAGNOSIS — R32 Unspecified urinary incontinence: Secondary | ICD-10-CM | POA: Diagnosis not present

## 2021-04-13 DIAGNOSIS — R413 Other amnesia: Secondary | ICD-10-CM

## 2021-04-13 MED ORDER — CEPHALEXIN 500 MG PO CAPS: 500.0000 mg | ORAL_CAPSULE | Freq: Four times a day (QID) | ORAL | 0 refills | Status: DC

## 2021-04-13 NOTE — Progress Notes (Signed)
BP (!) 146/85   Pulse 90   Wt 117 lb (53.1 kg)   SpO2 97%   BMI 22.11 kg/m    Subjective:   Patient ID: Carolyn Erickson, female    DOB: March 16, 1946, 75 y.o.   MRN: [id number]  HPI: Carolyn Erickson is a 75 y.o. female presenting on 04/13/2021 for Urinary Tract Infection (C/O burning and itching. Incontinence at night. Disoriented more per daughter)   HPI Patient is coming in today complaining of chronic urinary incontinence, she describes it as urge incontinence but also sometimes she does get leakage.  She says sometimes at night she will wet herself without knowing.  She has been increasing over the past few years.  She says also over the past 4 days she is having dysuria and burning and itching and discomfort that has been worsening.  She denies any fevers or chills or abdominal pain.  Per daughter who is here with her today her memory is worsening gradually and she is forgetting things and disoriented more this been going on for months but has worsened over the past week.  Relevant past medical, surgical, family and social history reviewed and updated as indicated. Interim medical history since our last visit reviewed. Allergies and medications reviewed and updated.  Review of Systems  Constitutional: Negative for chills and fever.  Eyes: Negative for visual disturbance.  Respiratory: Negative for chest tightness and shortness of breath.   Cardiovascular: Negative for chest pain and leg swelling.  Gastrointestinal: Negative for abdominal pain.  Genitourinary: Positive for dysuria, frequency and urgency. Negative for difficulty urinating, flank pain, hematuria, vaginal bleeding, vaginal discharge and vaginal pain.  Musculoskeletal: Negative for back pain and gait problem.  Skin: Negative for rash.  Neurological: Negative for light-headedness and headaches.  Psychiatric/Behavioral: Positive for confusion. Negative for agitation and behavioral problems.  All other systems reviewed  and are negative.   Per HPI unless specifically indicated above   Allergies as of 04/13/2021   No Known Allergies     Medication List       Accurate as of Apr 13, 2021  3:41 PM. If you have any questions, ask your nurse or doctor.        amLODipine 5 MG tablet Commonly known as: NORVASC TAKE 1 TABLET EVERY DAY   atorvastatin 20 MG tablet Commonly known as: LIPITOR TAKE 1 TABLET (20 MG TOTAL) BY MOUTH DAILY.   buPROPion 100 MG tablet Commonly known as: WELLBUTRIN Take 1 tablet (100 mg total) by mouth 2 (two) times daily.   cephALEXin 500 MG capsule Commonly known as: KEFLEX Take 1 capsule (500 mg total) by mouth 4 (four) times daily. Started by: Fransisca Kaufmann Anuhea Gassner, MD   meloxicam 7.5 MG tablet Commonly known as: MOBIC TAKE 1 TABLET EVERY DAY   venlafaxine 37.5 MG tablet Commonly known as: EFFEXOR TAKE 1 TABLET TWICE DAILY        Objective:   BP (!) 146/85   Pulse 90   Wt 117 lb (53.1 kg)   SpO2 97%   BMI 22.11 kg/m   Wt Readings from Last 3 Encounters:  04/13/21 117 lb (53.1 kg)  02/18/21 121 lb (54.9 kg)  02/10/21 119 lb 3.2 oz (54.1 kg)    Physical Exam Vitals and nursing note reviewed.  Constitutional:      General: She is not in acute distress.    Appearance: She is well-developed. She is not diaphoretic.  Eyes:     Conjunctiva/sclera: Conjunctivae normal.  Cardiovascular:     Rate and Rhythm: Normal rate and regular rhythm.     Heart sounds: Normal heart sounds. No murmur heard.   Pulmonary:     Effort: Pulmonary effort is normal. No respiratory distress.     Breath sounds: Normal breath sounds. No wheezing.  Abdominal:     General: Abdomen is flat. Bowel sounds are normal. There is no distension.     Tenderness: There is no abdominal tenderness. There is no guarding or rebound.  Skin:    General: Skin is warm and dry.     Findings: No rash.  Neurological:     Mental Status: She is alert and oriented to person, place, and time.      Coordination: Coordination normal.  Psychiatric:        Behavior: Behavior normal.        Cognition and Memory: She exhibits impaired recent memory.     Urinalysis 3+ blood and 2+ protein and nitrite positive.  Assessment & Plan:   Problem List Items Addressed This Visit   None   Visit Diagnoses    Dysuria    -  Primary   Relevant Medications   cephALEXin (KEFLEX) 500 MG capsule   Other Relevant Orders   Urine Culture   Urinalysis   Urinary incontinence, unspecified type       Relevant Orders   Urine Culture   Urinalysis   Ambulatory referral to Urology   Memory changes          Sent Keflex for possible UTI, memory issues may be because of the infection but we will keep a close eye on them and do testing in the future.  Chronic bladder incontinence, will refer to urology to see if they can help. Follow up plan: Return if symptoms worsen or fail to improve.  Counseling provided for all of the vaccine components Orders Placed This Encounter  Procedures  . Urine Culture  . Urinalysis  . Ambulatory referral to Urology    Caryl Pina, MD [facility] 04/13/2021, 3:41 PM

## 2021-04-13 NOTE — Telephone Encounter (Signed)
Has appointment today, will discuss during her appointment

## 2021-04-14 ENCOUNTER — Ambulatory Visit: Payer: Medicare HMO | Admitting: Family Medicine

## 2021-04-14 LAB — URINALYSIS
Bilirubin, UA: NEGATIVE
Glucose, UA: NEGATIVE
Ketones, UA: NEGATIVE
Nitrite, UA: POSITIVE — AB
Specific Gravity, UA: 1.025 (ref 1.005–1.030)
Urobilinogen, Ur: 0.2 mg/dL (ref 0.2–1.0)
pH, UA: 5.5 (ref 5.0–7.5)

## 2021-04-19 LAB — URINE CULTURE

## 2021-04-25 ENCOUNTER — Ambulatory Visit (INDEPENDENT_AMBULATORY_CARE_PROVIDER_SITE_OTHER): Payer: Medicare HMO | Admitting: Licensed Clinical Social Worker

## 2021-04-25 DIAGNOSIS — E782 Mixed hyperlipidemia: Secondary | ICD-10-CM

## 2021-04-25 DIAGNOSIS — M5136 Other intervertebral disc degeneration, lumbar region: Secondary | ICD-10-CM

## 2021-04-25 DIAGNOSIS — I10 Essential (primary) hypertension: Secondary | ICD-10-CM

## 2021-04-25 DIAGNOSIS — F339 Major depressive disorder, recurrent, unspecified: Secondary | ICD-10-CM

## 2021-04-25 NOTE — Chronic Care Management (AMB) (Signed)
Chronic Care Management    Clinical Social Work Note  04/25/2021 Name: Carolyn Erickson MRN: [id number] DOB: 05-11-1946  Carolyn Erickson is a 75 y.o. year old female who is a primary care patient of Dettinger, Fransisca Kaufmann, MD. The CCM team was consulted to assist the patient with chronic disease management and/or care coordination needs related to: [employer] .   Engaged with patient by telephone for follow up visit in response to provider referral for social work chronic care management and care coordination services.   Consent to Services:  The patient was given information about Chronic Care Management services, agreed to services, and gave verbal consent prior to initiation of services.  Please see initial visit note for detailed documentation.   Patient agreed to services and consent obtained.   Assessment: Review of patient past medical history, allergies, medications, and health status, including review of relevant consultants reports was performed today as part of a comprehensive evaluation and provision of chronic care management and care coordination services.     SDOH (Social Determinants of Health) assessments and interventions performed:  SDOH Interventions   Flowsheet Row Most Recent Value  SDOH Interventions   Depression Interventions/Treatment  Medication       Advanced Directives Status: See Vynca application for related entries.  CCM Care Plan  No Known Allergies  Outpatient Encounter Medications as of 04/25/2021  Medication Sig  . amLODipine (NORVASC) 5 MG tablet TAKE 1 TABLET EVERY DAY  . atorvastatin (LIPITOR) 20 MG tablet TAKE 1 TABLET (20 MG TOTAL) BY MOUTH DAILY.  [name] buPROPion (WELLBUTRIN) 100 MG tablet Take 1 tablet (100 mg total) by mouth 2 (two) times daily.  . cephALEXin (KEFLEX) 500 MG capsule Take 1 capsule (500 mg total) by mouth 4 (four) times daily.  . meloxicam (MOBIC) 7.5 MG tablet TAKE 1 TABLET EVERY DAY  . venlafaxine (EFFEXOR) 37.5 MG  tablet TAKE 1 TABLET TWICE DAILY   No facility-administered encounter medications on file as of 04/25/2021.    Patient Active Problem List   Diagnosis Date Noted  . Thyroid nodule 02/10/2021  . Affective disorder ([place]) 11/26/2019  . BMI 22.0-22.9, adult 11/26/2019  . Hx of adenomatous polyp of colon 11/26/2019  . Personal history of breast cancer 11/26/2019  . Skin irritation 11/26/2019  . DDD (degenerative disc disease), lumbar 06/20/2019  . Depression, recurrent ([place]) 05/29/2019  . Hyperlipidemia 05/29/2019  . Essential hypertension 05/29/2019  . Cervical radiculopathy 01/11/2018    Conditions to be addressed/monitored: Monitor client management of depression issues  Care Plan : Depression (Adult)  Updates made by Katha Cabal, LCSW since 04/25/2021 12:00 AM    Problem: Depression Identification (Depression)     Goal: Client will manage depression and depression symptoms faced over next 30 days   Start Date: 03/17/2021  Expected End Date: 06/16/2021  This Visit's Progress: On track  Recent Progress: On track  Priority: Medium  Note:   Current Barriers:  . Chronic Mental Health needs related to depression and depression management . Limited social support   . Suicidal Ideation/Homicidal Ideation: No  Clinical Social Work Goal(s):  . patient will work with SW monthly by telephone or in person to reduce or manage symptoms related to depression and depression management . patient will work with SW monthly to address concerns related to client completion of ADLs . Patient will communicate with RNCM or LCSW as needed for CCM support in next 30 days  Interventions:  1:1 collaboration with Dr. Vonna Kotyk Dettinger  MD regarding development and update of comprehensive plan of care as evidenced by provider attestation and co-signature  Talked with Remo Lipps about pain issues of client Talked with Kaydan about appetite of client Talked with Marvell about sleeping issues of client Talked  with Ernesta about vision of client Talked with Denajah about upcoming medical appointments for client Talked with Skylinn about energy level of client Talked with Danyella about breathing of client (she said she feels that her breathing is fine) Talked with Albie about walking of client (she said she has knee pain occasionally when walking)  Talked with Remo Lipps about relaxation techniques of client (likes to watch TV, likes to care for her pet dogs) Talked with Jasline about family support (spouse is supportive, daughter is supportive) Talked with Quinnetta about mood of client (she said she gets a little depressed occasionally ) Talked with Kimeka about transport needs of client (daughter is supportive with transport needs) Encouraged client to call RNCM as needed for nursing support  Patient Self Care Activities:  . Self administers medications as prescribed . Attends all scheduled provider appointments . Performs ADL's independently  Patient Coping Strengths:  . Family . Friends . Hopefulness  Patient Self Care Deficits:  . Lacks social connections . Transportation needs occasionally  Patient Goals:  - spend time or talk with others every day for next 30 day - practice relaxation or meditation daily for next 30 days - keep a calendar with appointment dates for next 30 days  Follow Up Plan: LCSW to call client on 05/31/21 to assess client needs       [name].Berry Gallacher MSW, LCSW Licensed Clinical Social Worker [facility] Care Management [phone]

## 2021-04-25 NOTE — Patient Instructions (Signed)
Visit Information  PATIENT GOALS: Goals Addressed            This Visit's Progress   . Protect My Health;Manage depression issues faced       Timeframe:  Short-Term Goal Priority:  Medium Progress: On Track Start Date:             04/25/21                Expected End Date:           07/26/21            Follow Up Date 05/31/21   Protect My Health (Patient) Manage depression issues faced    Why is this important?    Screening tests can find diseases early when they are easier to treat.   Your doctor or nurse will talk with you about which tests are important for you.   Getting shots for common diseases like the flu and shingles will help prevent them.     Patient Self Care Activities:  . Self administers medications as prescribed . Attends all scheduled provider appointments . Performs ADL's independently  Patient Coping Strengths:  . Family . Friends . Hopefulness  Patient Self Care Deficits:  . Lacks social connections . Transportation needs occasionally  Patient Goals:  - spend time or talk with others every day for next 30 day - practice relaxation or meditation daily for next 30 days - keep a calendar with appointment dates for next 30 days  Follow Up Plan: LCSW to call client on 05/31/21 to assess client needs       [name].Teneisha Gignac MSW, LCSW Licensed Clinical Social Worker [facility] Care Management [phone]

## 2021-05-16 ENCOUNTER — Other Ambulatory Visit: Payer: Self-pay | Admitting: Family Medicine

## 2021-05-16 DIAGNOSIS — F339 Major depressive disorder, recurrent, unspecified: Secondary | ICD-10-CM

## 2021-05-20 ENCOUNTER — Other Ambulatory Visit: Payer: Self-pay | Admitting: Family Medicine

## 2021-05-20 DIAGNOSIS — M5030 Other cervical disc degeneration, unspecified cervical region: Secondary | ICD-10-CM

## 2021-05-20 DIAGNOSIS — I10 Essential (primary) hypertension: Secondary | ICD-10-CM

## 2021-05-20 DIAGNOSIS — M5136 Other intervertebral disc degeneration, lumbar region: Secondary | ICD-10-CM

## 2021-05-31 ENCOUNTER — Telehealth: Payer: Medicare HMO

## 2021-06-14 ENCOUNTER — Ambulatory Visit: Payer: Medicare HMO | Admitting: Urology

## 2021-06-16 ENCOUNTER — Ambulatory Visit (INDEPENDENT_AMBULATORY_CARE_PROVIDER_SITE_OTHER): Payer: Medicare HMO | Admitting: Licensed Clinical Social Worker

## 2021-06-16 DIAGNOSIS — E782 Mixed hyperlipidemia: Secondary | ICD-10-CM

## 2021-06-16 DIAGNOSIS — I10 Essential (primary) hypertension: Secondary | ICD-10-CM | POA: Diagnosis not present

## 2021-06-16 DIAGNOSIS — F339 Major depressive disorder, recurrent, unspecified: Secondary | ICD-10-CM | POA: Diagnosis not present

## 2021-06-16 DIAGNOSIS — M5136 Other intervertebral disc degeneration, lumbar region: Secondary | ICD-10-CM

## 2021-06-16 NOTE — Chronic Care Management (AMB) (Signed)
Chronic Care Management    Clinical Social Work Note  06/16/2021 Name: Carolyn Erickson MRN: [id number] DOB: [birth year]  Carolyn Erickson is a 75 y.o. year old female who is a primary care patient of Dettinger, Fransisca Kaufmann, MD. The CCM team was consulted to assist the patient with chronic disease management and/or care coordination needs related to: [employer] .   Engaged with patient by telephone for follow up visit in response to provider referral for social work chronic care management and care coordination services.   Consent to Services:  The patient was given information about Chronic Care Management services, agreed to services, and gave verbal consent prior to initiation of services.  Please see initial visit note for detailed documentation.   Patient agreed to services and consent obtained.   Assessment: Review of patient past medical history, allergies, medications, and health status, including review of relevant consultants reports was performed today as part of a comprehensive evaluation and provision of chronic care management and care coordination services.     SDOH (Social Determinants of Health) assessments and interventions performed:  SDOH Interventions    Flowsheet Row Most Recent Value  SDOH Interventions   Depression Interventions/Treatment  Medication        Advanced Directives Status: See Vyn[employer] application for related entries.  CCM Care Plan  No Known Allergies  Outpatient Encounter Medications as of 06/16/2021  Medication Sig   amLODipine (NORVASC) 5 MG tablet TAKE 1 TABLET EVERY DAY   atorvastatin (LIPITOR) 20 MG tablet TAKE 1 TABLET (20 MG TOTAL) BY MOUTH DAILY.   buPROPion (WELLBUTRIN) 100 MG tablet Take 1 tablet (100 mg total) by mouth 2 (two) times daily.   cephALEXin (KEFLEX) 500 MG capsule Take 1 capsule (500 mg total) by mouth 4 (four) times daily.   meloxicam (MOBIC) 7.5 MG tablet TAKE 1 TABLET EVERY DAY   venlafaxine (EFFEXOR) 37.5 MG tablet  TAKE 1 TABLET TWICE DAILY   No facility-administered encounter medications on file as of 06/16/2021.    Patient Active Problem List   Diagnosis Date Noted   Thyroid nodule 02/10/2021   Affective disorder ([place]) 11/26/2019   BMI 22.0-22.9, adult 11/26/2019   Hx of adenomatous polyp of colon 11/26/2019   Personal history of breast cancer 11/26/2019   Skin irritation 11/26/2019   DDD (degenerative disc disease), lumbar 06/20/2019   Depression, recurrent ([place]) 05/29/2019   Hyperlipidemia 05/29/2019   Essential hypertension 05/29/2019   Cervical radiculopathy 01/11/2018    Conditions to be addressed/monitored: monitor client management of depression issues   Care Plan : Depression (Adult)  Updates made by Katha Cabal, LCSW since 06/16/2021 12:00 AM     Problem: Depression Identification (Depression)         Start Date: 06/16/2021  Expected End Date: 09/06/2021  This Visit's Progress: On track  Recent Progress: On track  Priority: Medium  Note:   Current Barriers:  Chronic Mental Health needs related to depression and depression management Limited social support   Suicidal Ideation/Homicidal Ideation: No  Clinical Social Work Goal(s):  patient will work with SW monthly by telephone or in person to reduce or manage symptoms related to depression and depression management patient will work with SW monthly to address concerns related to client completion of ADLs Patient will communicate with RNCM or LCSW as needed for CCM support in next 30 days  Interventions:  1:1 collaboration with Dr. Vonna Kotyk Dettinger MD regarding development and update of comprehensive plan of care as evidenced by  provider attestation and co-signature  Talked with Shaquera about pain issues of client (she spoke of some pain in her right knee) Talked with Remo Lipps about appetite of client Talked with Renuka about sleeping issues of client Talked with Judy about vision of client Talked with Gerilyn about fall  history of client Talked with Remo Lipps about upcoming medical appointments for client Talked with Ginette about energy level of client (she said she had reduced energy) Talked with Remo Lipps about breathing of client (she said she feels that her breathing is fine) Talked with Hira about walking of client (she said she has knee pain occasionally when walking)  Talked with Remo Lipps about relaxation techniques of client (likes to watch TV, likes to care for her pet dogs) Talked with Mariona about family support (spouse is supportive, daughter is supportive) Talked with Ariadne about mood of client (she said she gets a little depressed occasionally ) Talked with Rosenda about transport needs of client (daughter is supportive with transport needs) Encouraged client to call RNCM as needed for nursing support Talked with client about medication procurement Talked with client about meal provision for client  Patient Self Care Activities:  Self administers medications as prescribed Attends all scheduled provider appointments Performs ADL's independently  Patient Coping Strengths:  Family Friends Hopefulness  Patient Self Care Deficits:  Lacks Audiological scientist needs occasionally  Patient Goals:  - spend time or talk with others every day for next 30 day - practice relaxation or meditation daily for next 30 days - keep a calendar with appointment dates for next 30 days  Follow Up Plan: LCSW to call client on 07/26/21 to assess client needs      [name].Tenecia Ignasiak MSW, LCSW Licensed Clinical Social Worker [facility] Care Management [phone]

## 2021-06-16 NOTE — Patient Instructions (Signed)
Visit Information  PATIENT GOALS:  Goals Addressed             This Visit's Progress    Protect My Health;Manage depression issues faced       Timeframe:  Short-Term Goal Priority:  Medium Progress: On Track Start Date:             06/16/21                Expected End Date:           09/06/21            Follow Up Date 07/26/21   Protect My Health (Patient) Manage depression issues faced    Why is this important?   Screening tests can find diseases early when they are easier to treat.  Your doctor or nurse will talk with you about which tests are important for you.  Getting shots for common diseases like the flu and shingles will help prevent them.     Patient Self Care Activities:  Self administers medications as prescribed Attends all scheduled provider appointments Performs ADL's independently  Patient Coping Strengths:  Family Friends Hopefulness  Patient Self Care Deficits:  Lacks Audiological scientist needs occasionally  Patient Goals:  - spend time or talk with others every day for next 30 day - practice relaxation or meditation daily for next 30 days - keep a calendar with appointment dates for next 30 days  Follow Up Plan: LCSW to call client on 07/26/21 to assess client needs     [name].Larrie Lucia MSW, LCSW Licensed Clinical Social Worker [facility] Care Management [phone]

## 2021-06-29 ENCOUNTER — Ambulatory Visit (INDEPENDENT_AMBULATORY_CARE_PROVIDER_SITE_OTHER): Payer: Medicare HMO | Admitting: Family Medicine

## 2021-06-29 ENCOUNTER — Encounter: Payer: Self-pay | Admitting: Family Medicine

## 2021-06-29 ENCOUNTER — Other Ambulatory Visit: Payer: Self-pay

## 2021-06-29 VITALS — BP 132/82 | HR 75 | Temp 98.3°F | Ht 61.0 in | Wt 116.8 lb

## 2021-06-29 DIAGNOSIS — E010 Iodine-deficiency related diffuse (endemic) goiter: Secondary | ICD-10-CM

## 2021-06-29 DIAGNOSIS — R42 Dizziness and giddiness: Secondary | ICD-10-CM | POA: Diagnosis not present

## 2021-06-29 DIAGNOSIS — I10 Essential (primary) hypertension: Secondary | ICD-10-CM

## 2021-06-29 MED ORDER — AMLODIPINE BESYLATE 2.5 MG PO TABS: 2.5000 mg | ORAL_TABLET | Freq: Every day | ORAL | 1 refills | Status: DC

## 2021-06-29 NOTE — Progress Notes (Signed)
Subjective:  Patient ID: Carolyn Erickson, female    DOB: 10/29/1946  Age: 75 y.o. MRN: [id number]  CC: Dizziness   HPI Carolyn Erickson presents for Onset yesterday on arising. Felt bad. Had to use walker to avoid falling. Went back to bed for a while. Dizziness persisted. Went to bed early. Was able to get to bathroom during the night without a walker. Bad this morning. No spinning. Head and body not in sync. Not light headed. No  NVD. No Fever. No vision changes.   On March 8 this year she passed out. Fractured her skull and cheek bone.   Depression screen [facility] 2/9 06/29/2021 06/16/2021 04/25/2021  Decreased Interest _0 Down, Depressed, Hopeless 0 1 1  PHQ - 2 Score _1 Altered sleeping 3 0 0  Tired, decreased energy _2 Change in appetite 0 0 0  Feeling bad or failure about yourself  0 0 0  Trouble concentrating 0 1 1  Moving slowly or fidgety/restless 0 1 1  Suicidal thoughts 0 0 0  PHQ-9 Score _3 Difficult doing work/chores Not difficult at all Somewhat difficult Somewhat difficult  Some recent data might be hidden    History Carolyn Erickson has a past medical history of Cancer ([place]) (2001), Depression, and Hypertension.   She has a past surgical history that includes Abdominal hysterectomy and Back surgery.   Her family history includes Alzheimer's disease in her brother and father; Diabetes in her mother; Heart disease in her mother; Parkinson's disease in her sister.She reports that she has never smoked. She has never used smokeless tobacco. She reports that she does not drink alcohol and does not use drugs.    ROS Review of Systems  Constitutional: Negative.   HENT: Negative.    Eyes:  Negative for visual disturbance.  Respiratory:  Negative for shortness of breath.   Cardiovascular:  Negative for chest pain.  Gastrointestinal:  Negative for abdominal pain.  Musculoskeletal:  Negative for arthralgias.   Objective:  BP 132/82   Pulse 75   Temp 98.3 F (36.8 C)    Ht _4  (1.549 m)   Wt 116 lb 12.8 oz (53 kg)   SpO2 100%   BMI 22.07 kg/m   BP Readings from Last 3 Encounters:  06/29/21 132/82  04/13/21 (!) 146/85  02/18/21 126/74    Wt Readings from Last 3 Encounters:  06/29/21 116 lb 12.8 oz (53 kg)  04/13/21 117 lb (53.1 kg)  02/18/21 121 lb (54.9 kg)     Physical Exam Constitutional:      General: She is not in acute distress.    Appearance: She is well-developed.  HENT:     Head: Normocephalic and atraumatic.  Eyes:     Conjunctiva/sclera: Conjunctivae normal.     Pupils: Pupils are equal, round, and reactive to light.  Neck:     Thyroid: No thyromegaly.  Cardiovascular:     Rate and Rhythm: Normal rate and regular rhythm.     Heart sounds: Normal heart sounds. No murmur heard. Pulmonary:     Effort: Pulmonary effort is normal. No respiratory distress.     Breath sounds: Normal breath sounds. No wheezing or rales.  Abdominal:     General: Bowel sounds are normal. There is no distension.     Palpations: Abdomen is soft.     Tenderness: There is no abdominal tenderness.  Musculoskeletal:  General: Normal range of motion.     Cervical back: Normal range of motion and neck supple.  Lymphadenopathy:     Cervical: No cervical adenopathy.  Skin:    General: Skin is warm and dry.  Neurological:     Mental Status: She is alert and oriented to person, place, and time.  Psychiatric:        Behavior: Behavior normal.        Thought Content: Thought content normal.        Judgment: Judgment normal.      Assessment & Plan:   Carolyn Erickson was seen today for dizziness.  Diagnoses and all orders for this visit:  Thyromegaly -     Cancel: US SOFT TISSUE HEAD & NECK (NON-THYROID); Future -     CBC with Differential/Platelet -     CMP14+EGFR -     TSH + free T4 -     Urinalysis  Dizziness  Essential hypertension -     amLODipine (NORVASC) 2.5 MG tablet; Take 1 tablet (2.5 mg total) by mouth daily.      I have  discontinued Wallie Char. Joshi's cephALEXin. I have also changed her amLODipine. Additionally, I am having her maintain her buPROPion, venlafaxine, atorvastatin, and meloxicam.  Allergies as of 06/29/2021   No Known Allergies      Medication List        Accurate as of June 29, 2021 11:59 PM. If you have any questions, ask your nurse or doctor.          STOP taking these medications    cephALEXin 500 MG capsule Commonly known as: KEFLEX Stopped by: Claretta Fraise, MD       TAKE these medications    amLODipine 2.5 MG tablet Commonly known as: NORVASC Take 1 tablet (2.5 mg total) by mouth daily. What changed:  medication strength how much to take Changed by: Claretta Fraise, MD   atorvastatin 20 MG tablet Commonly known as: LIPITOR TAKE 1 TABLET (20 MG TOTAL) BY MOUTH DAILY.   buPROPion 100 MG tablet Commonly known as: WELLBUTRIN Take 1 tablet (100 mg total) by mouth 2 (two) times daily.   meloxicam 7.5 MG tablet Commonly known as: MOBIC TAKE 1 TABLET EVERY DAY   venlafaxine 37.5 MG tablet Commonly known as: EFFEXOR TAKE 1 TABLET TWICE DAILY         Follow-up: Return in about 1 month (around 07/30/2021) for Dr Dettinger for Dizziness.  Claretta Fraise, M.D.

## 2021-06-29 NOTE — Patient Instructions (Signed)
It is important to drink 64 ounces of water EVERY DAY

## 2021-06-30 LAB — CMP14+EGFR
ALT: 23 IU/L (ref 0–32)
AST: 20 IU/L (ref 0–40)
Albumin/Globulin Ratio: 1.6 (ref 1.2–2.2)
Albumin: 4.3 g/dL (ref 3.7–4.7)
Alkaline Phosphatase: 82 IU/L (ref 44–121)
BUN/Creatinine Ratio: 16 (ref 12–28)
BUN: 17 mg/dL (ref 8–27)
Bilirubin Total: 0.5 mg/dL (ref 0.0–1.2)
CO2: 25 mmol/L (ref 20–29)
Calcium: 10 mg/dL (ref 8.7–10.3)
Chloride: 102 mmol/L (ref 96–106)
Creatinine, Ser: 1.04 mg/dL — ABNORMAL HIGH (ref 0.57–1.00)
Globulin, Total: 2.7 g/dL (ref 1.5–4.5)
Glucose: 118 mg/dL — ABNORMAL HIGH (ref 65–99)
Potassium: 4.9 mmol/L (ref 3.5–5.2)
Sodium: 144 mmol/L (ref 134–144)
Total Protein: 7 g/dL (ref 6.0–8.5)
eGFR: 55 mL/min/{1.73_m2} — ABNORMAL LOW (ref >59)

## 2021-06-30 LAB — CBC WITH DIFFERENTIAL/PLATELET
Basophils Absolute: 0 10*3/uL (ref 0.0–0.2)
Basos: 0 %
EOS (ABSOLUTE): 0.3 10*3/uL (ref 0.0–0.4)
Eos: 3 %
Hematocrit: 45 % (ref 34.0–46.6)
Hemoglobin: 15.1 g/dL (ref 11.1–15.9)
Immature Grans (Abs): 0 10*3/uL (ref 0.0–0.1)
Immature Granulocytes: 0 %
Lymphocytes Absolute: 3.3 10*3/uL — ABNORMAL HIGH (ref 0.7–3.1)
Lymphs: 32 %
MCH: 30.3 pg (ref 26.6–33.0)
MCHC: 33.6 g/dL (ref 31.5–35.7)
MCV: 90 fL (ref 79–97)
Monocytes Absolute: 0.8 10*3/uL (ref 0.1–0.9)
Monocytes: 8 %
Neutrophils Absolute: 5.6 10*3/uL (ref 1.4–7.0)
Neutrophils: 57 %
Platelets: 282 10*3/uL (ref 150–450)
RBC: 4.99 x10E6/uL (ref 3.77–5.28)
RDW: 11.8 % (ref 11.7–15.4)
WBC: 10.1 10*3/uL (ref 3.4–10.8)

## 2021-06-30 LAB — TSH+FREE T4
Free T4: 1.42 ng/dL (ref 0.82–1.77)
TSH: 1.61 u[IU]/mL (ref 0.450–4.500)

## 2021-06-30 NOTE — Progress Notes (Signed)
Hello Carolyn Erickson,  Your lab result is normal and/or stable.Some minor variations that are not significant are commonly marked abnormal, but do not represent any medical problem for you.  Best regards, Claretta Fraise, M.D.

## 2021-07-04 ENCOUNTER — Encounter: Payer: Self-pay | Admitting: Family Medicine

## 2021-07-26 ENCOUNTER — Telehealth: Payer: Medicare HMO

## 2021-07-28 ENCOUNTER — Encounter: Payer: Self-pay | Admitting: Family Medicine

## 2021-07-28 ENCOUNTER — Other Ambulatory Visit: Payer: Self-pay

## 2021-07-28 ENCOUNTER — Ambulatory Visit (INDEPENDENT_AMBULATORY_CARE_PROVIDER_SITE_OTHER): Payer: Medicare HMO | Admitting: Family Medicine

## 2021-07-28 VITALS — BP 139/85 | HR 70 | Temp 98.2°F | Ht 61.0 in | Wt 117.0 lb

## 2021-07-28 DIAGNOSIS — I10 Essential (primary) hypertension: Secondary | ICD-10-CM | POA: Diagnosis not present

## 2021-07-28 DIAGNOSIS — M5030 Other cervical disc degeneration, unspecified cervical region: Secondary | ICD-10-CM | POA: Diagnosis not present

## 2021-07-28 DIAGNOSIS — M5136 Other intervertebral disc degeneration, lumbar region: Secondary | ICD-10-CM

## 2021-07-28 DIAGNOSIS — F339 Major depressive disorder, recurrent, unspecified: Secondary | ICD-10-CM | POA: Diagnosis not present

## 2021-07-28 DIAGNOSIS — E782 Mixed hyperlipidemia: Secondary | ICD-10-CM

## 2021-07-28 MED ORDER — MELOXICAM 7.5 MG PO TABS: 7.5000 mg | ORAL_TABLET | Freq: Every day | ORAL | 3 refills | Status: DC

## 2021-07-28 MED ORDER — ATORVASTATIN CALCIUM 20 MG PO TABS: 20.0000 mg | ORAL_TABLET | Freq: Every day | ORAL | 3 refills | Status: DC

## 2021-07-28 MED ORDER — AMLODIPINE BESYLATE 2.5 MG PO TABS: 2.5000 mg | ORAL_TABLET | Freq: Every day | ORAL | 3 refills | Status: DC

## 2021-07-28 MED ORDER — VENLAFAXINE HCL 37.5 MG PO TABS: 37.5000 mg | ORAL_TABLET | Freq: Two times a day (BID) | ORAL | 3 refills | Status: DC

## 2021-07-28 NOTE — Progress Notes (Signed)
BP 139/85   Pulse 70   Temp 98.2 F (36.8 C) (Temporal)   Ht '5\' 1"'$  (1.549 m)   Wt 117 lb (53.1 kg)   SpO2 99%   BMI 22.11 kg/m    Subjective:   Patient ID: Carolyn Erickson, female    DOB: 08/03/1946, 75 y.o.   MRN: [id number]  HPI: Carolyn Erickson is a 75 y.o. female presenting on 07/28/2021 for Medical Management of Chronic Issues (6 mth, came a couple a weeks ago for dizziness it is better )   HPI Hypertension Patient is currently on amlodipine, and their blood pressure today is 139/85. Patient denies any lightheadedness or dizziness. Patient denies headaches, blurred vision, chest pains, shortness of breath, or weakness. Denies any side effects from medication and is content with current medication.  Her dizziness has essentially resolved since last time she was seen a month ago.  Hyperlipidemia Patient is coming in for recheck of his hyperlipidemia. The patient is currently taking Lipitor. They deny any issues with myalgias or history of liver damage from it. They deny any focal numbness or weakness or chest pain.   Depression and anxiety recheck Wellbutrin and Effexor have been doing very well for her and she is very satisfied and denies any suicidal ideations or major depression.  Patient is coming in with lumbar pain that is been bothering her more and she is starting to get some numbness going down her right leg again like she had before she had surgeries before and some pain going down her left leg both on the lateral aspect, the one in the left leg does go down to her foot.  She has a history of a disc repair surgery in the lumbar region and these were similar symptoms to what she had been.  She is taken meloxicam and it does help some, does not hurt every day like this but most days it does not is been going on for few months.  Relevant past medical, surgical, family and social history reviewed and updated as indicated. Interim medical history since our last visit  reviewed. Allergies and medications reviewed and updated.  Review of Systems  Constitutional:  Negative for chills and fever.  HENT:  Negative for congestion, ear discharge and ear pain.   Eyes:  Negative for redness and visual disturbance.  Respiratory:  Negative for chest tightness and shortness of breath.   Cardiovascular:  Negative for chest pain and leg swelling.  Genitourinary:  Negative for difficulty urinating and dysuria.  Musculoskeletal:  Positive for back pain. Negative for gait problem.  Skin:  Negative for rash.  Neurological:  Positive for numbness. Negative for light-headedness and headaches.  Psychiatric/Behavioral:  Negative for agitation and behavioral problems.   All other systems reviewed and are negative.  Per HPI unless specifically indicated above   Allergies as of 07/28/2021   No Known Allergies      Medication List        Accurate as of July 28, 2021  8:29 AM. If you have any questions, ask your nurse or doctor.          amLODipine 2.5 MG tablet Commonly known as: NORVASC Take 1 tablet (2.5 mg total) by mouth daily.   atorvastatin 20 MG tablet Commonly known as: LIPITOR Take 1 tablet (20 mg total) by mouth daily.   buPROPion 100 MG tablet Commonly known as: WELLBUTRIN Take 1 tablet (100 mg total) by mouth 2 (two) times daily.   meloxicam 7.5  MG tablet Commonly known as: MOBIC Take 1 tablet (7.5 mg total) by mouth daily.   venlafaxine 37.5 MG tablet Commonly known as: EFFEXOR Take 1 tablet (37.5 mg total) by mouth 2 (two) times daily.         Objective:   BP 139/85   Pulse 70   Temp 98.2 F (36.8 C) (Temporal)   Ht '5\' 1"'$  (1.549 m)   Wt 117 lb (53.1 kg)   SpO2 99%   BMI 22.11 kg/m   Wt Readings from Last 3 Encounters:  07/28/21 117 lb (53.1 kg)  06/29/21 116 lb 12.8 oz (53 kg)  04/13/21 117 lb (53.1 kg)    Physical Exam Vitals and nursing note reviewed.  Constitutional:      General: She is not in acute distress.     Appearance: She is well-developed. She is not diaphoretic.  Eyes:     Conjunctiva/sclera: Conjunctivae normal.  Cardiovascular:     Rate and Rhythm: Normal rate and regular rhythm.     Heart sounds: Normal heart sounds. No murmur heard. Pulmonary:     Effort: Pulmonary effort is normal. No respiratory distress.     Breath sounds: Normal breath sounds. No wheezing.  Musculoskeletal:        General: No tenderness. Normal range of motion.     Lumbar back: No tenderness (No tenderness today on exam but does have some pain with flexion and extension of the back) or bony tenderness. Negative right straight leg raise test and negative left straight leg raise test.  Skin:    General: Skin is warm and dry.     Findings: No rash.  Neurological:     Mental Status: She is alert and oriented to person, place, and time.     Coordination: Coordination normal.  Psychiatric:        Behavior: Behavior normal.    Lumbar x-ray, await final read from radiology  Assessment & Plan:   Problem List Items Addressed This Visit       Cardiovascular and Mediastinum   Essential hypertension   Relevant Medications   atorvastatin (LIPITOR) 20 MG tablet   amLODipine (NORVASC) 2.5 MG tablet     Musculoskeletal and Integument   DDD (degenerative disc disease), lumbar   Relevant Medications   meloxicam (MOBIC) 7.5 MG tablet   Other Relevant Orders   DG Lumbar Spine 2-3 Views   Ambulatory referral to Physical Therapy     Other   Depression, recurrent ([place]) - Primary   Relevant Medications   venlafaxine (EFFEXOR) 37.5 MG tablet   Hyperlipidemia   Relevant Medications   atorvastatin (LIPITOR) 20 MG tablet   amLODipine (NORVASC) 2.5 MG tablet   Other Visit Diagnoses     Other cervical disc degeneration, unspecified cervical region       Relevant Medications   meloxicam (MOBIC) 7.5 MG tablet   Other Relevant Orders   DG Lumbar Spine 2-3 Views   Ambulatory referral to Physical Therapy     Will  do back x-ray and continue meloxicam and physical therapy for her lower back, if no better in 6 weeks call back and we can do an MRI.  Dizziness improved, continue current medication.  Follow up plan: Return in about 6 months (around 01/28/2022), or if symptoms worsen or fail to improve, for Hypertension and cholesterol and depression and anxiety.  Counseling provided for all of the vaccine components Orders Placed This Encounter  Procedures   DG Lumbar Spine 2-3 Views  Ambulatory referral to Physical Therapy    Caryl Pina, MD [facility] 07/28/2021, 8:29 AM

## 2021-08-01 ENCOUNTER — Other Ambulatory Visit: Payer: Self-pay

## 2021-08-01 ENCOUNTER — Ambulatory Visit (INDEPENDENT_AMBULATORY_CARE_PROVIDER_SITE_OTHER): Payer: Medicare HMO

## 2021-08-01 ENCOUNTER — Other Ambulatory Visit: Payer: Medicare HMO

## 2021-08-01 DIAGNOSIS — M47816 Spondylosis without myelopathy or radiculopathy, lumbar region: Secondary | ICD-10-CM | POA: Diagnosis not present

## 2021-08-01 DIAGNOSIS — M5136 Other intervertebral disc degeneration, lumbar region: Secondary | ICD-10-CM

## 2021-08-01 DIAGNOSIS — M5030 Other cervical disc degeneration, unspecified cervical region: Secondary | ICD-10-CM

## 2021-08-03 ENCOUNTER — Ambulatory Visit: Payer: Medicare HMO | Attending: Family Medicine | Admitting: Physical Therapy

## 2021-08-03 ENCOUNTER — Encounter: Payer: Self-pay | Admitting: Physical Therapy

## 2021-08-03 ENCOUNTER — Other Ambulatory Visit: Payer: Self-pay

## 2021-08-03 DIAGNOSIS — M5441 Lumbago with sciatica, right side: Secondary | ICD-10-CM | POA: Insufficient documentation

## 2021-08-03 DIAGNOSIS — G8929 Other chronic pain: Secondary | ICD-10-CM | POA: Diagnosis not present

## 2021-08-03 DIAGNOSIS — R293 Abnormal posture: Secondary | ICD-10-CM | POA: Insufficient documentation

## 2021-08-03 DIAGNOSIS — M5442 Lumbago with sciatica, left side: Secondary | ICD-10-CM | POA: Diagnosis not present

## 2021-08-03 NOTE — Therapy (Signed)
[facility] [place], [place], [address] Phone: [phone]   Fax:  [phone]  Physical Therapy Evaluation  Patient Details  Name: Carolyn Erickson MRN: [id number] Date of Birth: [birth year] Referring Provider (PT): Caryl Pina MD   Encounter Date: 08/03/2021   PT End of Session - 08/03/21 1114     Visit Number 1    Number of Visits 12    Date for PT Re-Evaluation 09/14/21    Authorization Type PROGRESS NOTE AT 10TH VISIT.  KX MODIFIER AFTER 15 VISITS.    PT Start Time [phone]    PT Stop Time 0940    PT Time Calculation (min) 42 min    Activity Tolerance Patient tolerated treatment well    Behavior During Therapy WFL for tasks assessed/performed             Past Medical History:  Diagnosis Date   Cancer ([place]) 2001   breast left   Depression    Hypertension     Past Surgical History:  Procedure Laterality Date   ABDOMINAL HYSTERECTOMY     BACK SURGERY      There were no vitals filed for this visit.    Subjective Assessment - 08/03/21 0929     Subjective COVID-19 screen performed prior to patient entering clinic.  The patient presents to the clinic today with a CC of right sided low back pain and pain into both thighs that will on occasions go below the level of her knees.  She states the bottom of her feet also feel numb at times.  She states this has been going on for a long time.  She has a significant PMH for a lumbar suregry in 1996.  She cannot think of anything in particular that increases her pain.  Medication helps decrease her pain.    Pertinent History Lumbar surgery (1996), breast cancer (2001), HTN.    How long can you walk comfortably? She states grocery shopping presents no significant difficulty for her.    Diagnostic tests X-ray.    Patient Stated Goals Would like to reduce low back and LE pain.    Currently in Pain? Yes    Pain Score 8     Pain Location Back    Pain Orientation Right     Pain Descriptors / Indicators Throbbing    Pain Type Chronic pain    Pain Radiating Towards Bilateral LE's.    Pain Onset More than a month ago    Pain Frequency Constant    Aggravating Factors  See above.    Pain Relieving Factors See above.                [facility] PT Assessment - 08/03/21 0001       Assessment   Medical Diagnosis Lumbar DDD.    Referring Provider (PT) Caryl Pina MD    Onset Date/Surgical Date --   Ongoing.     Precautions   Precautions Fall    Precaution Comments Recommended patient use an assistive device for safe ambulation.      Restrictions   Weight Bearing Restrictions No      Balance Screen   Has the patient fallen in the past 6 months Yes    How many times? 2.    Has the patient had a decrease in activity level because of a fear of falling?  No    Is the patient reluctant to leave their home because of a fear of falling?  No      Home Environment   Living Environment Private residence      Prior Function   Level of Independence Independent      Posture/Postural Control   Posture/Postural Control Postural limitations    Postural Limitations Decreased lumbar lordosis    Posture Comments Lumbar scoliosis  noted.  Bilateral genu varum.      Deep Tendon Reflexes   DTR Assessment Site Patella;Achilles    Patella DTR 2+    Achilles DTR 2+      ROM / Strength   AROM / PROM / Strength AROM;Strength      AROM   Overall AROM Comments Active lumbar extension is 10 degress and flexion is limited by 50%.      Strength   Overall Strength Comments Normal LE strength.      Palpation   Palpation comment Tender to palpation over patient's right lumbar region.      Special Tests   Other special tests Equal leg lengths.  (-) SLR testing. (-) Romberg test but supervision required.      Ambulation/Gait   Gait Comments Patient demonstrated a scissor gait x 1 but did not lose balance.  Assistive device recommeded for patient.                         Objective measurements completed on examination: See above findings.       [facility] Adult PT Treatment/Exercise - 08/03/21 0001       Modalities   Modalities Electrical Stimulation;Moist Heat      Moist Heat Therapy   Number Minutes Moist Heat 15 Minutes    Moist Heat Location Lumbar Spine      Electrical Stimulation   Electrical Stimulation Location Lower lumbar.    Electrical Stimulation Action IFC at 80-150 Hz.    Electrical Stimulation Parameters 40% scan x 15 minutes.    Electrical Stimulation Goals Tone;Pain                         PT Long Term Goals - 08/03/21 1113       PT LONG TERM GOAL #1   Title Independent with a HEP.    Time 6    Period Weeks    Status New      PT LONG TERM GOAL #2   Title Eliminate LE symptoms.    Time 6    Period Weeks    Status New      PT LONG TERM GOAL #3   Title Perform ADL's with LBP level  not > 3-4/10.    Time 6    Period Weeks    Status New                    Plan - 08/03/21 1103     Clinical Impression Statement The patient presents to O[facility]T with c/o chronic low back pain.  She reports pain into both thighs that occasionally goes below her knees and at times when the bottom of her feet feel numb.  She has a loss of lumbar lordosis and lumbar scoliosis.  Her PMH is remarkable for a lumbar surgery in 1996.  She is palpably tender over her right lumbar region. Her LE DTR's are normal as is her LE strength.  She demonstrated negative SLR testing.  It was recommended that she use an assistive device for safe ambulation as she was observed scissoring her gait  x 1. Patient will benefit from skilled physical therapy intervention to address pain and deficits.    Personal Factors and Comorbidities Comorbidity 1;Other    Comorbidities Lumbar surgery (1996), breast cancer (2001), HTN.    Examination-Activity Limitations Other    Examination-Participation Restrictions Other     Stability/Clinical Decision Making Stable/Uncomplicated    Clinical Decision Making Low    Rehab Potential Good    PT Frequency 2x / week    PT Duration 6 weeks    PT Treatment/Interventions ADLs/Self Care Home Management;Cryotherapy;Electrical Stimulation;Ultrasound;Moist Heat;Therapeutic activities;Functional mobility training;Therapeutic exercise;Balance training;Neuromuscular re-education;Manual techniques;Patient/family education;Passive range of motion    PT Next Visit Plan Modalties and STW/M as needed.  Core exercise progression.  SKTC.    Consulted and Agree with Plan of Care Patient             Patient will benefit from skilled therapeutic intervention in order to improve the following deficits and impairments:  Abnormal gait, Decreased activity tolerance, Pain, Postural dysfunction, Increased muscle spasms, Decreased range of motion  Visit Diagnosis: Chronic right-sided low back pain with bilateral sciatica - Plan: PT plan of care cert/re-cert  Abnormal posture - Plan: PT plan of care cert/re-cert     Problem List Patient Active Problem List   Diagnosis Date Noted   Thyroid nodule 02/10/2021   Affective disorder ([place]) 11/26/2019   BMI 22.0-22.9, adult 11/26/2019   Hx of adenomatous polyp of colon 11/26/2019   Personal history of breast cancer 11/26/2019   Skin irritation 11/26/2019   DDD (degenerative disc disease), lumbar 06/20/2019   Depression, recurrent ([place]) 05/29/2019   Hyperlipidemia 05/29/2019   Essential hypertension 05/29/2019   Cervical radiculopathy 01/11/2018    Denasia Venn, [place] MPT 08/03/2021, 11:18 AM  [facility] [address] [place], [place], [address] Phone: [phone]   Fax:  [phone]  Name: Carolyn Erickson MRN: [id number] Date of Birth: [birth year]

## 2021-08-04 ENCOUNTER — Ambulatory Visit (INDEPENDENT_AMBULATORY_CARE_PROVIDER_SITE_OTHER): Payer: Medicare HMO

## 2021-08-04 ENCOUNTER — Other Ambulatory Visit: Payer: Self-pay | Admitting: Family Medicine

## 2021-08-04 VITALS — Ht 61.0 in | Wt 116.0 lb

## 2021-08-04 DIAGNOSIS — Z0000 Encounter for general adult medical examination without abnormal findings: Secondary | ICD-10-CM

## 2021-08-04 DIAGNOSIS — F339 Major depressive disorder, recurrent, unspecified: Secondary | ICD-10-CM

## 2021-08-04 NOTE — Progress Notes (Signed)
Subjective:   Carolyn Erickson is a 75 y.o. female who presents for Medicare Annual (Subsequent) preventive examination.  Review of Systems     Cardiac Risk Factors include: advanced age (>37mn, >>7women);sedentary lifestyle;dyslipidemia;hypertension     Objective:    Today's Vitals   08/04/21 1112 08/04/21 1113  Weight: 116 lb (52.6 kg)   Height: '5\' 1"'$  (1.549 m)   PainSc:  3    Body mass index is 21.92 kg/m.  Advanced Directives 08/04/2021 08/03/2021 06/28/2020  Does Patient Have a Medical Advance Directive? No No No;Yes  Type of Advance Directive - - Living will  Does patient want to make changes to medical advance directive? - - No - Patient declined  Would patient like information on creating a medical advance directive? No - Patient declined - No - Patient declined    Current Medications (verified) Outpatient Encounter Medications as of 08/04/2021  Medication Sig   amLODipine (NORVASC) 2.5 MG tablet Take 1 tablet (2.5 mg total) by mouth daily.   atorvastatin (LIPITOR) 20 MG tablet Take 1 tablet (20 mg total) by mouth daily.   buPROPion (WELLBUTRIN) 100 MG tablet Take 1 tablet (100 mg total) by mouth 2 (two) times daily.   cholecalciferol (VITAMIN D3) 25 MCG (1000 UNIT) tablet Take 1,000 Units by mouth daily.   meloxicam (MOBIC) 7.5 MG tablet Take 1 tablet (7.5 mg total) by mouth daily.   venlafaxine (EFFEXOR) 37.5 MG tablet Take 1 tablet (37.5 mg total) by mouth 2 (two) times daily.   No facility-administered encounter medications on file as of 08/04/2021.    Allergies (verified) Patient has no known allergies.   History: Past Medical History:  Diagnosis Date   [place]   breast left   Depression    Hypertension    Past Surgical History:  Procedure Laterality Date   ABDOMINAL HYSTERECTOMY     BACK SURGERY     Family History  Problem Relation Age of Onset   Diabetes Mother    Heart disease Mother    Alzheimer's disease Father    Parkinson's  disease Sister    Alzheimer's disease Brother    Social History   Socioeconomic History   Marital status: Married    Spouse name: JDellis Filbert  Number of children: 3   Years of education: Not on file   Highest education level: Not on file  Occupational History   Occupation: retired  Tobacco Use   Smoking status: Never   Smokeless tobacco: Never  Vaping Use   Vaping Use: Never used  Substance and Sexual Activity   Alcohol use: Never   Drug use: Never   Sexual activity: Not on file    Comment: married for 576years  Other Topics Concern   Not on file  Social History Narrative   Has 3 children, 6 grandkids   Lives home with husband.   2 of her children live nearby.   Social Determinants of Health   Financial Resource Strain: Low Risk    Difficulty of Paying Living Expenses: Not hard at all  Food Insecurity: No Food Insecurity   Worried About RCharity fundraiserin the Last Year: Never true   [place]in the Last Year: Never true  Transportation Needs: No Transportation Needs   Lack of Transportation (Medical): No   Lack of Transportation (Non-Medical): No  Physical Activity: Inactive   Days of Exercise per Week: 0 days   Minutes of Exercise per Session:  0 min  Stress: No Stress Concern Present   Feeling of Stress : Not at all  Social Connections: Moderately Integrated   Frequency of Communication with Friends and Family: More than three times a week   Frequency of Social Gatherings with Friends and Family: Once a week   Attends Religious Services: More than 4 times per year   Active Member of [employer] or Organizations: No   Attends Music therapist: Never   Marital Status: Married    Tobacco Counseling Counseling given: Not Answered   Clinical Intake:  Pre-visit preparation completed: Yes  Pain : 0-10 Pain Score: 3  Pain Type: Chronic pain Pain Location: Leg Pain Orientation: Right, Left Pain Descriptors / Indicators: Aching,  Discomfort Pain Onset: More than a month ago Pain Frequency: Intermittent     BMI - recorded: 21.92 Nutritional Status: BMI of 19-24  Normal Nutritional Risks: None Diabetes: No  How often do you need to have someone help you when you read instructions, pamphlets, or other written materials from your doctor or pharmacy?: 1 - Never  Diabetic? No  Interpreter Needed?: No  Information entered by :: Jaszmine Navejas, LPN   Activities of Daily Living In your present state of health, do you have any difficulty performing the following activities: 08/04/2021  Hearing? Y  Comment wears hearing aids  Vision? N  Difficulty concentrating or making decisions? N  Walking or climbing stairs? Y  Comment mild  Dressing or bathing? N  Doing errands, shopping? N  Preparing Food and eating ? N  Using the Toilet? N  In the past six months, have you accidently leaked urine? N  Do you have problems with loss of bowel control? N  Managing your Medications? N  Managing your Finances? N  Housekeeping or managing your Housekeeping? N  Some recent data might be hidden    Patient Care Team: Dettinger, Fransisca Kaufmann, MD as PCP - General (Family Medicine) Shea [place], [name], LCSW as Social Worker (Licensed Clinical Social Worker)  Indicate any recent [employer] you may have received from other than [facility] providers in the past year (date may be approximate).     Assessment:   This is a routine wellness examination for Jarika.  Hearing/Vision screen Hearing Screening - Comments:: Wears hearing aids - sees someone in [place] annually Vision Screening - Comments:: Wears glasses - up to date with annual eye exams in [place] issues and exercise activities discussed: Current Exercise Habits: The patient does not participate in regular exercise at present, Exercise limited by: orthopedic condition(s)   Goals Addressed             This Visit's Progress    Prevent falls   On track       Depression Screen PHQ 2/9 Scores 08/04/2021 07/28/2021 06/29/2021 06/16/2021 04/25/2021 04/13/2021 03/17/2021  PHQ - 2 Score 0 0 '2 2 2 2 2  '$ PHQ- 9 Score '3 4 8 5 5 '$ - 5    Fall Risk Fall Risk  08/04/2021 07/28/2021 06/29/2021 06/29/2021 04/13/2021  Falls in the past year? 0 0 1 0 1  Number falls in past yr: 0 0 0 - 0  Injury with Fall? 0 0 1 - 1  Risk for fall due to : History of fall(s);Impaired vision;Impaired balance/gait No Fall Risks History of fall(s) - History of fall(s);Impaired balance/gait  Follow up Education provided;Falls prevention discussed Education provided Falls evaluation completed - Falls evaluation completed    FALL RISK PREVENTION PERTAINING TO  THE HOME:  Any stairs in or around the home? Yes  If so, are there any without handrails? No  Home free of loose throw rugs in walkways, pet beds, electrical cords, etc? Yes  Adequate lighting in your home to reduce risk of falls? Yes   ASSISTIVE DEVICES UTILIZED TO PREVENT FALLS:  Life alert? No  Use of a cane, walker or w/c?  Doesn't use, but thinks she may need to start Grab bars in the bathroom? No  Shower chair or bench in shower? No  Elevated toilet seat or a handicapped toilet? No   TIMED UP AND GO:  Was the test performed? No . Telephonic visit  Cognitive Function:     6CIT Screen 08/04/2021 06/28/2020  What Year? 0 points 0 points  What month? 0 points 0 points  What time? 0 points 0 points  Count back from 20 0 points 0 points  Months in reverse 0 points 0 points  Repeat phrase 0 points 4 points  Total Score 0 4    Immunizations Immunization History  Administered Date(s) Administered   Influenza, High Dose Seasonal PF 09/19/2018   Influenza,inj,Quad PF,6+ Mos 09/05/2018, 09/26/2019, 10/25/2020   Janssen (J&J) SARS-COV-2 Vaccination 04/05/2020   Pneumococcal Conjugate-13 10/09/2014   Pneumococcal Polysaccharide-23 05/28/2018   Zoster, Live 06/13/2015    TDAP status: Due, Education has been provided  regarding the importance of this vaccine. Advised may receive this vaccine at local pharmacy or Health Dept. Aware to provide a copy of the vaccination record if obtained from local pharmacy or Health Dept. Verbalized acceptance and understanding.  Flu Vaccine status: Up to date  Pneumococcal vaccine status: Up to date  Covid-19 vaccine status: Completed vaccines (asked patient to bring [place] her imm. Card)  Qualifies for Shingles Vaccine? Yes   Zostavax completed Yes   Shingrix Completed?: No.    Education has been provided regarding the importance of this vaccine. Patient has been advised to call insurance company to determine out of pocket expense if they have not yet received this vaccine. Advised may also receive vaccine at local pharmacy or Health Dept. Verbalized acceptance and understanding.  Screening Tests Health Maintenance  Topic Date Due   Zoster Vaccines- Shingrix (1 of 2) Never done   INFLUENZA VACCINE  09/13/2021 (Originally 07/04/2021)   COVID-19 Vaccine (2 - Booster for Janssen series) 01/26/2022 (Originally 05/31/2020)   TETANUS/TDAP  01/28/2022 (Originally 03/13/1965)   DEXA SCAN  Completed   Hepatitis C Screening  Completed   PNA vac Low Risk Adult  Completed   HPV VACCINES  Aged Out    Health Maintenance  Health Maintenance Due  Topic Date Due   Zoster Vaccines- Shingrix (1 of 2) Never done    Colorectal cancer screening: No longer required.   Mammogram- she has an appt this month  Bone Density status: Completed 06/17/2018. Results reflect: Bone density results: OSTEOPENIA. Repeat every 2 years.  Lung Cancer Screening: (Low Dose CT Chest recommended if Age 46-80 years, 30 pack-year currently smoking OR have quit w/in 15years.) does not qualify.  Additional Screening:  Hepatitis C Screening: does qualify; Completed 07/28/2020  Vision Screening: Recommended annual ophthalmology exams for early detection of glaucoma and other disorders of the eye. Is the patient  up to date with their annual eye exam?  Yes  Who is the provider or what is the name of the office in which the patient attends annual eye exams? Someone in [place] If pt is not established with a provider,  would they like to be referred to a provider to establish care? No .   Dental Screening: Recommended annual dental exams for proper oral hygiene  Community Resource Referral / Chronic Care Management: CRR required this visit?  No   CCM required this visit?  No      Plan:     I have personally reviewed and noted the following in the patient's chart:   Medical and social history Use of alcohol, tobacco or illicit drugs  Current medications and supplements including opioid prescriptions.  Functional ability and status Nutritional status Physical activity Advanced directives List of other physicians Hospitalizations, surgeries, and ER visits in previous 12 months Vitals Screenings to include cognitive, depression, and falls Referrals and appointments  In addition, I have reviewed and discussed with patient certain preventive protocols, quality metrics, and best practice recommendations. A written personalized care plan for preventive services as well as general preventive health recommendations were provided to patient.     Sandrea Hammond, LPN   [id number]   Nurse Notes: None

## 2021-08-04 NOTE — Patient Instructions (Signed)
Carolyn Erickson , Thank you for taking time to come for your Medicare Wellness Visit. I appreciate your ongoing commitment to your health goals. Please review the following plan we discussed and let me know if I can assist you in the future.   Screening recommendations/referrals: Colonoscopy: Done 05/28/2014 - No repeat required Mammogram: Done 10/17/2019 - Recommend Repeat annually (keep appointment this month) Bone Density: Done 06/17/2018 - Repeat every 2 years  (next office visit) Recommended yearly ophthalmology/optometry visit for glaucoma screening and checkup Recommended yearly dental visit for hygiene and checkup  Vaccinations: Influenza vaccine: Done 10/25/2020 - Repeat annually Pneumococcal vaccine: Done 10/09/2014 & 05/28/2018 Tdap vaccine: Due (every 10 years) Shingles vaccine: Zostavax done 2016 - Shingrix discussed. Please contact your pharmacy for coverage information.     Covid-19: Done 04/05/2020 - need dates of any boosters please  Advanced directives: Advance directive discussed with you today. Even though you declined this today, please call our office should you change your mind, and we can give you the proper paperwork for you to fill out.   Conditions/risks identified: Aim for 30 minutes of exercise or brisk walking each day, drink 6-8 glasses of water and eat lots of fruits and vegetables.   Next appointment: Follow up in one year for your annual wellness visit    Preventive Care 65 Years and Older, Female Preventive care refers to lifestyle choices and visits with your health care provider that can promote health and wellness. What does preventive care include? A yearly physical exam. This is also called an annual well check. Dental exams once or twice a year. Routine eye exams. Ask your health care provider how often you should have your eyes checked. Personal lifestyle choices, including: Daily care of your teeth and gums. Regular physical activity. Eating a  healthy diet. Avoiding tobacco and drug use. Limiting alcohol use. Practicing safe sex. Taking low-dose aspirin every day. Taking vitamin and mineral supplements as recommended by your health care provider. What happens during an annual well check? The services and screenings done by your health care provider during your annual well check will depend on your age, overall health, lifestyle risk factors, and family history of disease. Counseling  Your health care provider may ask you questions about your: Alcohol use. Tobacco use. Drug use. Emotional well-being. Home and relationship well-being. Sexual activity. Eating habits. History of falls. Memory and ability to understand (cognition). Work and work Statistician. Reproductive health. Screening  You may have the following tests or measurements: Height, weight, and BMI. Blood pressure. Lipid and cholesterol levels. These may be checked every 5 years, or more frequently if you are over 23 years old. Skin check. Lung cancer screening. You may have this screening every year starting at age 33 if you have a 30-pack-year history of smoking and currently smoke or have quit within the past 15 years. Fecal occult blood test (FOBT) of the stool. You may have this test every year starting at age 66. Flexible sigmoidoscopy or colonoscopy. You may have a sigmoidoscopy every 5 years or a colonoscopy every 10 years starting at age 41. Hepatitis C blood test. Hepatitis B blood test. Sexually transmitted disease (STD) testing. Diabetes screening. This is done by checking your blood sugar (glucose) after you have not eaten for a while (fasting). You may have this done every 1-3 years. Bone density scan. This is done to screen for osteoporosis. You may have this done starting at age 27. Mammogram. This may be done every 1-2 years.  Talk to your health care provider about how often you should have regular mammograms. Talk with your health care  provider about your test results, treatment options, and if necessary, the need for more tests. Vaccines  Your health care provider may recommend certain vaccines, such as: Influenza vaccine. This is recommended every year. Tetanus, diphtheria, and acellular pertussis (Tdap, Td) vaccine. You may need a Td booster every 10 years. Zoster vaccine. You may need this after age 55. Pneumococcal 13-valent conjugate (PCV13) vaccine. One dose is recommended after age 72. Pneumococcal polysaccharide (PPSV23) vaccine. One dose is recommended after age 6. Talk to your health care provider about which screenings and vaccines you need and how often you need them. This information is not intended to replace advice given to you by your health care provider. Make sure you discuss any questions you have with your health care provider. Document Released: 12/17/2015 Document Revised: 08/09/2016 Document Reviewed: 09/21/2015 Elsevier Interactive Patient Education  2017 [place] Prevention in the Home Falls can cause injuries. They can happen to people of all ages. There are many things you can do to make your home safe and to help prevent falls. What can I do on the outside of my home? Regularly fix the edges of walkways and driveways and fix any cracks. Remove anything that might make you trip as you walk through a door, such as a raised step or threshold. Trim any bushes or trees on the path to your home. Use bright outdoor lighting. Clear any walking paths of anything that might make someone trip, such as rocks or tools. Regularly check to see if handrails are loose or broken. Make sure that both sides of any steps have handrails. Any raised decks and porches should have guardrails on the edges. Have any leaves, snow, or ice cleared regularly. Use sand or salt on walking paths during winter. Clean up any spills in your garage right away. This includes oil or grease spills. What can I do in the  bathroom? Use night lights. Install grab bars by the toilet and in the tub and shower. Do not use towel bars as grab bars. Use non-skid mats or decals in the tub or shower. If you need to sit down in the shower, use a plastic, non-slip stool. Keep the floor dry. Clean up any water that spills on the floor as soon as it happens. Remove soap buildup in the tub or shower regularly. Attach bath mats securely with double-sided non-slip rug tape. Do not have throw rugs and other things on the floor that can make you trip. What can I do in the bedroom? Use night lights. Make sure that you have a light by your bed that is easy to reach. Do not use any sheets or blankets that are too big for your bed. They should not hang down onto the floor. Have a firm chair that has side arms. You can use this for support while you get dressed. Do not have throw rugs and other things on the floor that can make you trip. What can I do in the kitchen? Clean up any spills right away. Avoid walking on wet floors. Keep items that you use a lot in easy-to-reach places. If you need to reach something above you, use a strong step stool that has a grab bar. Keep electrical cords out of the way. Do not use floor polish or wax that makes floors slippery. If you must use wax, use non-skid floor wax.  Do not have throw rugs and other things on the floor that can make you trip. What can I do with my stairs? Do not leave any items on the stairs. Make sure that there are handrails on both sides of the stairs and use them. Fix handrails that are broken or loose. Make sure that handrails are as long as the stairways. Check any carpeting to make sure that it is firmly attached to the stairs. Fix any carpet that is loose or worn. Avoid having throw rugs at the top or bottom of the stairs. If you do have throw rugs, attach them to the floor with carpet tape. Make sure that you have a light switch at the top of the stairs and the  bottom of the stairs. If you do not have them, ask someone to add them for you. What else can I do to help prevent falls? Wear shoes that: Do not have high heels. Have rubber bottoms. Are comfortable and fit you well. Are closed at the toe. Do not wear sandals. If you use a stepladder: Make sure that it is fully opened. Do not climb a closed stepladder. Make sure that both sides of the stepladder are locked into place. Ask someone to hold it for you, if possible. Clearly mark and make sure that you can see: Any grab bars or handrails. First and last steps. Where the edge of each step is. Use tools that help you move around (mobility aids) if they are needed. These include: Canes. Walkers. Scooters. Crutches. Turn on the lights when you go into a dark area. Replace any light bulbs as soon as they burn out. Set up your furniture so you have a clear path. Avoid moving your furniture around. If any of your floors are uneven, fix them. If there are any pets around you, be aware of where they are. Review your medicines with your doctor. Some medicines can make you feel dizzy. This can increase your chance of falling. Ask your doctor what other things that you can do to help prevent falls. This information is not intended to replace advice given to you by your health care provider. Make sure you discuss any questions you have with your health care provider. Document Released: 09/16/2009 Document Revised: 04/27/2016 Document Reviewed: 12/25/2014 Elsevier Interactive Patient Education  2017 [employer].

## 2021-08-09 NOTE — Progress Notes (Signed)
Virtual Visit via Telephone Note  I connected with  Carolyn Erickson on 08/04/2021 at 11:15 AM EDT by telephone and verified that I am speaking with the correct person using two identifiers.  Location: Patient: Home Provider: [facility] Persons participating in the virtual visit: patient/Nurse Health Advisor   I discussed the limitations, risks, security and privacy concerns of performing an evaluation and management service by telephone and the availability of in person appointments. The patient expressed understanding and agreed to proceed.  Interactive audio and video telecommunications were attempted between this nurse and patient, however failed, due to patient having technical difficulties OR patient did not have access to video capability.  We continued and completed visit with audio only.  Some vital signs may be absent or patient reported.   Sinia Antosh Dionne Ano, LPN

## 2021-08-10 ENCOUNTER — Other Ambulatory Visit: Payer: Self-pay

## 2021-08-10 ENCOUNTER — Ambulatory Visit: Payer: Medicare HMO | Attending: Family Medicine | Admitting: Physical Therapy

## 2021-08-10 DIAGNOSIS — G8929 Other chronic pain: Secondary | ICD-10-CM | POA: Diagnosis not present

## 2021-08-10 DIAGNOSIS — R293 Abnormal posture: Secondary | ICD-10-CM | POA: Diagnosis not present

## 2021-08-10 DIAGNOSIS — M5442 Lumbago with sciatica, left side: Secondary | ICD-10-CM | POA: Diagnosis not present

## 2021-08-10 DIAGNOSIS — M5441 Lumbago with sciatica, right side: Secondary | ICD-10-CM | POA: Insufficient documentation

## 2021-08-10 NOTE — Therapy (Signed)
[facility] [place], [place], [address] Phone: [phone]   Fax:  [phone]  Physical Therapy Treatment  Patient Details  Name: Carolyn Erickson MRN: [id number] Date of Birth: 02/07/1946 Referring Provider (PT): Caryl Pina MD   Encounter Date: 08/10/2021   PT End of Session - 08/10/21 1020     Visit Number 2    Number of Visits 12    Authorization Type PROGRESS NOTE AT 10TH VISIT.  KX MODIFIER AFTER 15 VISITS.    PT Start Time 0900    PT Stop Time 0953    PT Time Calculation (min) 53 min    Activity Tolerance Patient tolerated treatment well    Behavior During Therapy WFL for tasks assessed/performed             Past Medical History:  Diagnosis Date   Cancer ([place]) 2001   breast left   Depression    Hypertension     Past Surgical History:  Procedure Laterality Date   ABDOMINAL HYSTERECTOMY     BACK SURGERY      There were no vitals filed for this visit.   Subjective Assessment - 08/10/21 1018     Subjective COVID-19 screen performed prior to patient entering clinic.  First treatment helped.  Patient with low pain-level today.    Pertinent History Lumbar surgery (1996), breast cancer (2001), HTN.    How long can you walk comfortably? She states grocery shopping presents no significant difficulty for her.    Diagnostic tests X-ray.    Patient Stated Goals Would like to reduce low back and LE pain.    Currently in Pain? Yes    Pain Score 1     Pain Location Back    Pain Descriptors / Indicators Aching                               [facility] Adult PT Treatment/Exercise - 08/10/21 0001       Modalities   Modalities Electrical Stimulation;Moist Heat;Ultrasound      Moist Heat Therapy   Number Minutes Moist Heat 20 Minutes    Moist Heat Location Lumbar Spine      Electrical Stimulation   Electrical Stimulation Location Right low back    Electrical Stimulation Action IFC at  80-150 Hz.    Electrical Stimulation Parameters 40% scan x 20 minutes.    Electrical Stimulation Goals Tone;Pain      Ultrasound   Ultrasound Location RT LB    Ultrasound Parameters Combo e'stim/US at 1.50 W/CM2 x 12 minutes.    Ultrasound Goals Pain      Manual Therapy   Manual Therapy Soft tissue mobilization    Soft tissue mobilization STW/M x 11 minutes to patient's right lumbar musculature to reduce tone.                  Upper Extremity Functional Index Score :   /80        PT Long Term Goals - 08/03/21 1113       PT LONG TERM GOAL #1   Title Independent with a HEP.    Time 6    Period Weeks    Status New      PT LONG TERM GOAL #2   Title Eliminate LE symptoms.    Time 6    Period Weeks    Status New      PT LONG TERM  GOAL #3   Title Perform ADL's with LBP level  not > 3-4/10.    Time 6    Period Weeks    Status New                   Plan - 08/10/21 1022     Clinical Impression Statement The patient did very well with treatment today.  She was found to have a trigger point in her right lumbar musculature that did very well with STW/M.  She had no pain after treatment.    Personal Factors and Comorbidities Comorbidity 1;Other    Comorbidities Lumbar surgery (1996), breast cancer (2001), HTN.    Examination-Activity Limitations Other    Examination-Participation Restrictions Other    Stability/Clinical Decision Making Stable/Uncomplicated    Rehab Potential Good    PT Frequency 2x / week    PT Duration 6 weeks    PT Treatment/Interventions ADLs/Self Care Home Management;Cryotherapy;Electrical Stimulation;Ultrasound;Moist Heat;Therapeutic activities;Functional mobility training;Therapeutic exercise;Balance training;Neuromuscular re-education;Manual techniques;Patient/family education;Passive range of motion    PT Next Visit Plan Modalties and STW/M as needed.  Core exercise progression.  SKTC.    Consulted and Agree with Plan of Care  Patient             Patient will benefit from skilled therapeutic intervention in order to improve the following deficits and impairments:  Abnormal gait, Decreased activity tolerance, Pain, Postural dysfunction, Increased muscle spasms, Decreased range of motion  Visit Diagnosis: Chronic right-sided low back pain with bilateral sciatica  Abnormal posture     Problem List Patient Active Problem List   Diagnosis Date Noted   Thyroid nodule 02/10/2021   Affective disorder ([place]) 11/26/2019   BMI 22.0-22.9, adult 11/26/2019   Hx of adenomatous polyp of colon 11/26/2019   Personal history of breast cancer 11/26/2019   Skin irritation 11/26/2019   DDD (degenerative disc disease), lumbar 06/20/2019   Depression, recurrent ([place]) 05/29/2019   Hyperlipidemia 05/29/2019   Essential hypertension 05/29/2019   Cervical radiculopathy 01/11/2018    Myan Suit, [place], PT 08/10/2021, 10:23 AM  [facility] [address] [place], [place], [address] Phone: [phone]   Fax:  [phone]  Name: Carolyn Erickson MRN: [id number] Date of Birth: [birth year]

## 2021-08-16 ENCOUNTER — Ambulatory Visit: Payer: Medicare HMO | Admitting: Physical Therapy

## 2021-08-16 ENCOUNTER — Other Ambulatory Visit: Payer: Self-pay

## 2021-08-16 DIAGNOSIS — M5441 Lumbago with sciatica, right side: Secondary | ICD-10-CM | POA: Diagnosis not present

## 2021-08-16 DIAGNOSIS — R293 Abnormal posture: Secondary | ICD-10-CM

## 2021-08-16 DIAGNOSIS — M5442 Lumbago with sciatica, left side: Secondary | ICD-10-CM | POA: Diagnosis not present

## 2021-08-16 DIAGNOSIS — G8929 Other chronic pain: Secondary | ICD-10-CM | POA: Diagnosis not present

## 2021-08-16 NOTE — Therapy (Signed)
[facility] [place], [place], [address] Phone: [phone]   Fax:  [phone]  Physical Therapy Treatment  Patient Details  Name: Carolyn Erickson MRN: [id number] Date of Birth: October 25, 1946 Referring Provider (PT): Caryl Pina MD   Encounter Date: 08/16/2021   PT End of Session - 08/16/21 1022     Visit Number 3    Number of Visits 12    Date for PT Re-Evaluation 09/14/21    Authorization Type PROGRESS NOTE AT 10TH VISIT.  KX MODIFIER AFTER 15 VISITS.    PT Start Time 0900    PT Stop Time [id number]    PT Time Calculation (min) 55 min    Activity Tolerance Patient tolerated treatment well    Behavior During Therapy WFL for tasks assessed/performed             Past Medical History:  Diagnosis Date   Cancer ([place]) 2001   breast left   Depression    Hypertension     Past Surgical History:  Procedure Laterality Date   ABDOMINAL HYSTERECTOMY     BACK SURGERY      There were no vitals filed for this visit.   Subjective Assessment - 08/16/21 1023     Subjective COVID-19 screen performed prior to patient entering clinic.  Back is feeling good.  Thighs hurt some.    Pertinent History Lumbar surgery (1996), breast cancer (2001), HTN.    Patient Stated Goals Would like to reduce low back and LE pain.    Currently in Pain? Yes    Pain Score 1     Pain Location Back    Pain Orientation Right;Left    Pain Descriptors / Indicators Aching    Pain Onset More than a month ago                               [facility] Adult PT Treatment/Exercise - 08/16/21 0001       Exercises   Exercises Knee/Hip;Lumbar      Lumbar Exercises: Machines for Strengthening   Cybex Lumbar Extension 50# x 3 minutes.      Knee/Hip Exercises: Aerobic   Nustep Level 3 x 15 minutes.      Knee/Hip Exercises: Machines for Strengthening   Cybex Knee Extension 10# x 3 minutes.    Cybex Knee Flexion 30# x 3 minutes.       Modalities   Modalities Electrical Stimulation;Moist Heat      Moist Heat Therapy   Number Minutes Moist Heat 20 Minutes    Moist Heat Location Lumbar Spine      Electrical Stimulation   Electrical Stimulation Location LB    Electrical Stimulation Action IFC at 80-150 Hz.    Electrical Stimulation Parameters 40% scan x 20 minutes.    Electrical Stimulation Goals Tone;Pain                          PT Long Term Goals - 08/03/21 1113       PT LONG TERM GOAL #1   Title Independent with a HEP.    Time 6    Period Weeks    Status New      PT LONG TERM GOAL #2   Title Eliminate LE symptoms.    Time 6    Period Weeks    Status New      PT LONG TERM GOAL #  3   Title Perform ADL's with LBP level  not > 3-4/10.    Time 6    Period Weeks    Status New                   Plan - 08/16/21 1025     Clinical Impression Statement The patient did a great job.  Very little back pain with CC of some pain in thighs.  She did very well with resisted exercise today.  She asked about walking on her treadmill at home.  We discussed trying a session her first to see how she does.    Personal Factors and Comorbidities Comorbidity 1;Other    Comorbidities Lumbar surgery (1996), breast cancer (2001), HTN.    Examination-Activity Limitations Other    Examination-Participation Restrictions Other    Stability/Clinical Decision Making Stable/Uncomplicated    Rehab Potential Good    PT Frequency 2x / week    PT Duration 6 weeks    PT Treatment/Interventions ADLs/Self Care Home Management;Cryotherapy;Electrical Stimulation;Ultrasound;Moist Heat;Therapeutic activities;Functional mobility training;Therapeutic exercise;Balance training;Neuromuscular re-education;Manual techniques;Patient/family education;Passive range of motion    PT Next Visit Plan Core exercise progression.    Consulted and Agree with Plan of Care Patient             Patient will benefit from skilled  therapeutic intervention in order to improve the following deficits and impairments:  Abnormal gait, Decreased activity tolerance, Pain, Postural dysfunction, Increased muscle spasms, Decreased range of motion  Visit Diagnosis: Chronic right-sided low back pain with bilateral sciatica  Abnormal posture     Problem List Patient Active Problem List   Diagnosis Date Noted   Thyroid nodule 02/10/2021   Affective disorder ([place]) 11/26/2019   BMI 22.0-22.9, adult 11/26/2019   Hx of adenomatous polyp of colon 11/26/2019   Personal history of breast cancer 11/26/2019   Skin irritation 11/26/2019   DDD (degenerative disc disease), lumbar 06/20/2019   Depression, recurrent ([place]) 05/29/2019   Hyperlipidemia 05/29/2019   Essential hypertension 05/29/2019   Cervical radiculopathy 01/11/2018    Meridee Branum, [place], PT 08/16/2021, 10:28 AM  [facility] [address] [place], [place], [address] Phone: [phone]   Fax:  [phone]  Name: Carolyn Erickson MRN: [id number] Date of Birth: December 16, 1945

## 2021-08-18 ENCOUNTER — Other Ambulatory Visit: Payer: Self-pay

## 2021-08-18 ENCOUNTER — Ambulatory Visit: Payer: Medicare HMO | Admitting: Physical Therapy

## 2021-08-18 DIAGNOSIS — M5441 Lumbago with sciatica, right side: Secondary | ICD-10-CM | POA: Diagnosis not present

## 2021-08-18 DIAGNOSIS — R293 Abnormal posture: Secondary | ICD-10-CM

## 2021-08-18 DIAGNOSIS — M5442 Lumbago with sciatica, left side: Secondary | ICD-10-CM | POA: Diagnosis not present

## 2021-08-18 DIAGNOSIS — G8929 Other chronic pain: Secondary | ICD-10-CM | POA: Diagnosis not present

## 2021-08-18 NOTE — Therapy (Signed)
[facility] [place], [place], [address] Phone: [phone]   Fax:  [phone]  Physical Therapy Treatment  Patient Details  Name: Carolyn Erickson MRN: [id number] Date of Birth: 1946/05/19 Referring Provider (PT): Caryl Pina MD   Encounter Date: 08/18/2021   PT End of Session - 08/18/21 0903     Visit Number 4    Number of Visits 12    Date for PT Re-Evaluation 09/14/21    Authorization Type PROGRESS NOTE AT 10TH VISIT.  KX MODIFIER AFTER 15 VISITS.    PT Start Time 0900    PT Stop Time 0947    PT Time Calculation (min) 47 min    Activity Tolerance Patient tolerated treatment well    Behavior During Therapy WFL for tasks assessed/performed             Past Medical History:  Diagnosis Date   Cancer ([place]) 2001   breast left   Depression    Hypertension     Past Surgical History:  Procedure Laterality Date   ABDOMINAL HYSTERECTOMY     BACK SURGERY      There were no vitals filed for this visit.   Subjective Assessment - 08/18/21 0901     Subjective COVID-19 screen performed prior to patient entering clinic. Patient reported some discomfort today.    Pertinent History Lumbar surgery (1996), breast cancer (2001), HTN.    How long can you walk comfortably? She states grocery shopping presents no significant difficulty for her.    Diagnostic tests X-ray.    Patient Stated Goals Would like to reduce low back and LE pain.    Currently in Pain? Yes    Pain Score 1     Pain Location Back    Pain Orientation Right;Left    Pain Descriptors / Indicators Aching;Discomfort    Pain Type Chronic pain    Pain Onset More than a month ago    Pain Frequency Intermittent    Aggravating Factors  prolong activity    Pain Relieving Factors rest                               [facility] Adult PT Treatment/Exercise - 08/18/21 0001       Lumbar Exercises: Standing   Scapular Retraction  Strengthening;Both;20 reps;Theraband    Theraband Level (Scapular Retraction) Level 1 (Yellow)    Shoulder Extension Strengthening;Both;20 reps;Theraband    Theraband Level (Shoulder Extension) Level 1 (Yellow)      Lumbar Exercises: Supine   Ab Set 10 reps;3 seconds    Bent Knee Raise 3 seconds   2x10   Bridge with clamshell 20 reps   yellow band   Straight Leg Raise 3 seconds   2x10     Knee/Hip Exercises: Aerobic   Nustep Level 3 x 15 min UE/LE      Moist Heat Therapy   Number Minutes Moist Heat 15 Minutes    Moist Heat Location Lumbar Spine      Electrical Stimulation   Electrical Stimulation Location LB    Electrical Stimulation Action IFC    Electrical Stimulation Parameters 80-_0  x75mn    Electrical Stimulation Goals Tone;Pain                     PT Education - 08/18/21 0249 [id number]    Education Details HEP progression    Person(s) Educated Patient    Methods Explanation;Demonstration;Handout  Comprehension Verbalized understanding;Returned demonstration                 PT Long Term Goals - 08/18/21 0904       PT LONG TERM GOAL #1   Title Independent with a HEP.    Baseline HEP issued today 08/18/21    Time 6    Period Weeks    Status Partially Met      PT LONG TERM GOAL #2   Title Eliminate LE symptoms.    Baseline Right knee and down leg per reported with less frequency of symptoms reported 08/18/21    Time 6    Period Weeks    Status On-going      PT LONG TERM GOAL #3   Title Perform ADL's with LBP level  not > 3-4/10.    Baseline 1/10 pain with ADL's 08/18/21    Time 6    Period Weeks    Status Achieved                   Plan - 08/18/21 6979     Clinical Impression Statement Patient tolerated treatment well today. Patient issued HEP progression today. Patient has reported overall improvement with less back pain and less right LE symptoms. Patient able to perform ADL's with greater ease. LTG #3 met with remaining  progressing.    Personal Factors and Comorbidities Comorbidity 1;Other    Comorbidities Lumbar surgery (1996), breast cancer (2001), HTN.    Examination-Activity Limitations Other    Examination-Participation Restrictions Other    Stability/Clinical Decision Making Stable/Uncomplicated    Rehab Potential Good    PT Frequency 2x / week    PT Duration 6 weeks    PT Treatment/Interventions ADLs/Self Care Home Management;Cryotherapy;Electrical Stimulation;Ultrasound;Moist Heat;Therapeutic activities;Functional mobility training;Therapeutic exercise;Balance training;Neuromuscular re-education;Manual techniques;Patient/family education;Passive range of motion    PT Next Visit Plan Core exercise progression and review HEP    Consulted and Agree with Plan of Care Patient             Patient will benefit from skilled therapeutic intervention in order to improve the following deficits and impairments:  Abnormal gait, Decreased activity tolerance, Pain, Postural dysfunction, Increased muscle spasms, Decreased range of motion  Visit Diagnosis: Chronic right-sided low back pain with bilateral sciatica  Abnormal posture     Problem List Patient Active Problem List   Diagnosis Date Noted   Thyroid nodule 02/10/2021   Affective disorder ([place]) 11/26/2019   BMI 22.0-22.9, adult 11/26/2019   Hx of adenomatous polyp of colon 11/26/2019   Personal history of breast cancer 11/26/2019   Skin irritation 11/26/2019   DDD (degenerative disc disease), lumbar 06/20/2019   Depression, recurrent ([place]) 05/29/2019   Hyperlipidemia 05/29/2019   Essential hypertension 05/29/2019   Cervical radiculopathy 01/11/2018    Noriah Osgood P, PTA 08/18/2021, 9:57 AM  [facility] [address] [place], [place], [address] Phone: [phone]   Fax:  [phone]  Name: Carolyn Erickson MRN: [id number] Date of Birth: 1946-02-10

## 2021-08-18 NOTE — Patient Instructions (Signed)
Pelvic Tilt: Posterior - Legs Bent (Supine)  Tighten stomach and flatten back by rolling pelvis down. Hold _10___ seconds. Relax. Repeat _10-30___ times per set. Do __2__ sets per session. Do _2___ sessions per day.   Bent Leg Lift (Hook-Lying)  Tighten stomach and slowly raise right leg _5___ inches from floor. Keep trunk rigid. Hold _3___ seconds. Repeat _10___ times per set. Do ___2-3_ sets per session. Do __2__ sessions per day.   Straight Leg Raise  Tighten stomach and slowly raise locked right leg __4__ inches from floor. Repeat __10-30__ times per set. Do __2__ sets per session. Do __2__ sessions per day.   Scapular Retraction: Bilateral  Facing anchor, pull arms back, bringing shoulder blades together. Repeat _30___ times per set. Do __2-3__ sets per session. Do _2___ sessions per day.    Standing lat pull with theraband  Anchor bands higher than your head. Start with your arms straight out in front of you at shoulder height (or a little above).  Pull bands down next to your body and then slowly return to the starting position.  10-30 x1day    Bridging with Theraband  Begin by lying on your back with your knees bent and theraband around your knees. Tighten your abs by tilting your pelvis up and flattening your back on the mat. Squeeze your glutes and raise your hips off of the table towards the ceiling. Keeping your hips raised, pull your knees outward against the band. Relax your knees back to midline and slowly return your hips to the mat. Relax and Breathe 2x10 1x daily

## 2021-08-23 ENCOUNTER — Other Ambulatory Visit: Payer: Self-pay

## 2021-08-23 ENCOUNTER — Ambulatory Visit: Payer: Medicare HMO | Admitting: *Deleted

## 2021-08-23 DIAGNOSIS — R293 Abnormal posture: Secondary | ICD-10-CM | POA: Diagnosis not present

## 2021-08-23 DIAGNOSIS — M5442 Lumbago with sciatica, left side: Secondary | ICD-10-CM | POA: Diagnosis not present

## 2021-08-23 DIAGNOSIS — G8929 Other chronic pain: Secondary | ICD-10-CM

## 2021-08-23 DIAGNOSIS — M5441 Lumbago with sciatica, right side: Secondary | ICD-10-CM | POA: Diagnosis not present

## 2021-08-23 NOTE — Therapy (Signed)
[facility] [place], [place], [address] Phone: [phone]   Fax:  [phone]  Physical Therapy Treatment  Patient Details  Name: Carolyn Erickson MRN: [id number] Date of Birth: Apr 22, 1946 Referring Provider (PT): Caryl Pina MD   Encounter Date: 08/23/2021   PT End of Session - 08/23/21 0903     Visit Number 5    Number of Visits 12    Date for PT Re-Evaluation 09/14/21    Authorization Type PROGRESS NOTE AT 10TH VISIT.  KX MODIFIER AFTER 15 VISITS.    PT Start Time 0900    PT Stop Time 0950    PT Time Calculation (min) 50 min             Past Medical History:  Diagnosis Date   Cancer ([place]) 2001   breast left   Depression    Hypertension     Past Surgical History:  Procedure Laterality Date   ABDOMINAL HYSTERECTOMY     BACK SURGERY      There were no vitals filed for this visit.   Subjective Assessment - 08/23/21 0900     Subjective COVID-19 screen performed prior to patient entering clinic. Patient reported some discomfort today. But yesterday I had leg pain while sitting in a straight chair    Pertinent History Lumbar surgery (1996), breast cancer (2001), HTN.    How long can you walk comfortably? She states grocery shopping presents no significant difficulty for her.    Patient Stated Goals Would like to reduce low back and LE pain.    Currently in Pain? Yes    Pain Score 2     Pain Location Back    Pain Orientation Right;Left    Pain Descriptors / Indicators Aching;Discomfort    Pain Type Chronic pain    Pain Onset More than a month ago                               [facility] Adult PT Treatment/Exercise - 08/23/21 0001       Self-Care   Self-Care Posture    Posture discussed and reviewed sitting posture and LB position and support. Used towel roll in clinic handout given      Therapeutic Activites    Therapeutic Activities ADL's    ADL's Log roll in/out of bed       Exercises   Exercises Knee/Hip;Lumbar      Lumbar Exercises: Standing   Scapular Retraction Strengthening;Both;20 reps;Theraband    Theraband Level (Scapular Retraction) Level 1 (Yellow)    Shoulder Extension Strengthening;Both;20 reps;Theraband    Theraband Level (Shoulder Extension) Level 1 (Yellow)      Lumbar Exercises: Supine   Ab Set 10 reps;3 seconds    Bent Knee Raise 3 seconds   2x10   Bridge 20 reps   2x10     Knee/Hip Exercises: Aerobic   Nustep Level 3 x 10 min UE/LE      Modalities   Modalities Electrical Stimulation;Moist Heat      Moist Heat Therapy   Number Minutes Moist Heat 15 Minutes    Moist Heat Location Lumbar Spine      Electrical Stimulation   Electrical Stimulation Location LB    Electrical Stimulation Action IFC    Electrical Stimulation Parameters 80-_0  x15 mins    Electrical Stimulation Goals Tone;Pain  PT Long Term Goals - 08/18/21 0904       PT LONG TERM GOAL #1   Title Independent with a HEP.    Baseline HEP issued today 08/18/21    Time 6    Period Weeks    Status Partially Met      PT LONG TERM GOAL #2   Title Eliminate LE symptoms.    Baseline Right knee and down leg per reported with less frequency of symptoms reported 08/18/21    Time 6    Period Weeks    Status On-going      PT LONG TERM GOAL #3   Title Perform ADL's with LBP level  not > 3-4/10.    Baseline 1/10 pain with ADL's 08/18/21    Time 6    Period Weeks    Status Achieved                   Plan - 08/23/21 0955     Clinical Impression Statement Pt arrived today doing fairly well and reports leg pain on/off yesterday. Pt asked to be put on hold x 2 weeks. Rx focused on HEP, sitting posture with towel roll, as well as avoiding motions and positions that are pain triggers. Pt felt good end of session and will be on hold x 2 weeks    Personal Factors and Comorbidities Comorbidity 1;Other    Comorbidities Lumbar  surgery (1996), breast cancer (2001), HTN.    Examination-Participation Restrictions Other    Stability/Clinical Decision Making Stable/Uncomplicated    Rehab Potential Good    PT Frequency 2x / week    PT Treatment/Interventions ADLs/Self Care Home Management;Cryotherapy;Electrical Stimulation;Ultrasound;Moist Heat;Therapeutic activities;Functional mobility training;Therapeutic exercise;Balance training;Neuromuscular re-education;Manual techniques;Patient/family education;Passive range of motion    PT Next Visit Plan Core exercise progression and review HEP               Pt on hold x 2 weeks as per Pt             Patient will benefit from skilled therapeutic intervention in order to improve the following deficits and impairments:  Abnormal gait, Decreased activity tolerance, Pain, Postural dysfunction, Increased muscle spasms, Decreased range of motion  Visit Diagnosis: Chronic right-sided low back pain with bilateral sciatica  Abnormal posture     Problem List Patient Active Problem List   Diagnosis Date Noted   Thyroid nodule 02/10/2021   Affective disorder ([place]) 11/26/2019   BMI 22.0-22.9, adult 11/26/2019   Hx of adenomatous polyp of colon 11/26/2019   Personal history of breast cancer 11/26/2019   Skin irritation 11/26/2019   DDD (degenerative disc disease), lumbar 06/20/2019   Depression, recurrent ([place]) 05/29/2019   Hyperlipidemia 05/29/2019   Essential hypertension 05/29/2019   Cervical radiculopathy 01/11/2018    Charlcie Prisco,CHRIS, PTA 08/23/2021, 10:08 AM  [facility] [address] [place], [place], [address] Phone: [phone]   Fax:  [phone]  Name: Carolyn Erickson MRN: [id number] Date of Birth: September 07, 1946

## 2021-08-25 ENCOUNTER — Encounter: Payer: Medicare HMO | Admitting: Physical Therapy

## 2021-08-25 DIAGNOSIS — Z1231 Encounter for screening mammogram for malignant neoplasm of breast: Secondary | ICD-10-CM | POA: Diagnosis not present

## 2021-09-05 ENCOUNTER — Ambulatory Visit (INDEPENDENT_AMBULATORY_CARE_PROVIDER_SITE_OTHER): Payer: Medicare HMO | Admitting: Licensed Clinical Social Worker

## 2021-09-05 DIAGNOSIS — E782 Mixed hyperlipidemia: Secondary | ICD-10-CM

## 2021-09-05 DIAGNOSIS — F339 Major depressive disorder, recurrent, unspecified: Secondary | ICD-10-CM

## 2021-09-05 DIAGNOSIS — M5136 Other intervertebral disc degeneration, lumbar region: Secondary | ICD-10-CM

## 2021-09-05 DIAGNOSIS — I10 Essential (primary) hypertension: Secondary | ICD-10-CM

## 2021-09-05 NOTE — Patient Instructions (Signed)
Visit Information  PATIENT GOALS:  Goals Addressed             This Visit's Progress    Protect My Health;Manage depression issues faced       Timeframe:  Short-Term Goal Priority:  Medium Progress: On Track Start Date:             09/05/21                Expected End Date:           11/30/21            Follow Up Date  10/24/21 at 3:00 PM   Protect My Health (Patient) Manage depression issues faced    Why is this important?   Screening tests can find diseases early when they are easier to treat.  Your doctor or nurse will talk with you about which tests are important for you.  Getting shots for common diseases like the flu and shingles will help prevent them.     Patient Self Care Activities:  Self administers medications as prescribed Attends all scheduled provider appointments Performs ADL's independently  Patient Coping Strengths:  Family Friends Hopefulness  Patient Self Care Deficits:  Lacks Audiological scientist needs occasionally  Patient Goals:  - spend time or talk with others every day for next 30 day - practice relaxation or meditation daily for next 30 days - keep a calendar with appointment dates for next 30 days  Follow Up Plan: LCSW to call client or Taniaya Rudder on 10/24/21 at 3:00 PM to assess client needs     [name].Greenley Martone MSW, LCSW Licensed Clinical Social Worker [facility] Care Management [phone]

## 2021-09-05 NOTE — Chronic Care Management (AMB) (Signed)
Chronic Care Management    Clinical Social Work Note  09/05/2021 Name: Carolyn Erickson MRN: [id number] DOB: 16-May-1946  Carolyn Erickson is a 75 y.o. year old female who is a primary care patient of Dettinger, Fransisca Kaufmann, MD. The CCM team was consulted to assist the patient with chronic disease management and/or care coordination needs related to: [employer] .   Engaged with patient /spouse of patient, Carolyn Erickson, by telephone for follow up visit in response to provider referral for social work chronic care management and care coordination services.   Consent to Services:  The patient was given information about Chronic Care Management services, agreed to services, and gave verbal consent prior to initiation of services.  Please see initial visit note for detailed documentation.   Patient agreed to services and consent obtained.   Assessment: Review of patient past medical history, allergies, medications, and health status, including review of relevant consultants reports was performed today as part of a comprehensive evaluation and provision of chronic care management and care coordination services.     SDOH (Social Determinants of Health) assessments and interventions performed:  SDOH Interventions    Flowsheet Row Most Recent Value  SDOH Interventions   Physical Activity Interventions Other (Comments)  [client is having some walking challenges]  Stress Interventions Provide Counseling  [client is having some stress related to managing her health needs]  Depression Interventions/Treatment  Counseling        Advanced Directives Status: See Vyn[employer] application for related entries.  CCM Care Plan  No Known Allergies  Outpatient Encounter Medications as of 09/05/2021  Medication Sig Note   amLODipine (NORVASC) 2.5 MG tablet Take 1 tablet (2.5 mg total) by mouth daily. 08/04/2021: Taking BID   atorvastatin (LIPITOR) 20 MG tablet Take 1 tablet (20 mg total) by mouth daily.     buPROPion (WELLBUTRIN) 100 MG tablet TAKE 1 TABLET TWICE DAILY    cholecalciferol (VITAMIN D3) 25 MCG (1000 UNIT) tablet Take 1,000 Units by mouth daily.    meloxicam (MOBIC) 7.5 MG tablet Take 1 tablet (7.5 mg total) by mouth daily.    venlafaxine (EFFEXOR) 37.5 MG tablet Take 1 tablet (37.5 mg total) by mouth 2 (two) times daily.    No facility-administered encounter medications on file as of 09/05/2021.    Patient Active Problem List   Diagnosis Date Noted   Thyroid nodule 02/10/2021   Affective disorder ([place]) 11/26/2019   BMI 22.0-22.9, adult 11/26/2019   Hx of adenomatous polyp of colon 11/26/2019   Personal history of breast cancer 11/26/2019   Skin irritation 11/26/2019   DDD (degenerative disc disease), lumbar 06/20/2019   Depression, recurrent ([place]) 05/29/2019   Hyperlipidemia 05/29/2019   Essential hypertension 05/29/2019   Cervical radiculopathy 01/11/2018    Conditions to be addressed/monitored: monitor client management of depression and depression issues   Care Plan : Depression (Adult)  Updates made by Katha Cabal, LCSW since 09/05/2021 12:00 AM     Problem: Depression Identification (Depression)      Goal: Client will manage depression and depression symptoms faced over next 30 days   Start Date: 09/05/2021  Expected End Date: 11/30/2021  This Visit's Progress: On track  Recent Progress: On track  Priority: Medium  Note:   Current Barriers:  Chronic Mental Health needs related to depression and depression management Limited social support   Some walking challenges Suicidal Ideation/Homicidal Ideation: No  Clinical Social Work Goal(s):  patient will work with SW monthly by telephone or  in person to reduce or manage symptoms related to depression and depression management patient will work with SW monthly to address concerns related to client completion of ADLs Patient will communicate with RNCM or LCSW as needed for CCM support in next 30  days  Interventions:  1:1 collaboration with Dr. Vonna Kotyk Dettinger MD regarding development and update of comprehensive plan of care as evidenced by provider attestation and co-signature  Reviewed with Carolyn Erickson, spouse of client, pain issues of client  Reviewed with Carolyn Erickson the appetite of client Discussed with Carolyn Erickson the vision challenges of client Discussed with Carolyn Erickson upcoming medical appointments for client Carolyn Erickson regarding physical therapy sessions received by client Carolyn Erickson about walking of client (he said she has knee pain occasionally when walking; however, client has been participating in scheduled physical therapy sessions)  Reviewed with Carolyn Erickson the current mood status of client. Carolyn Erickson said client is having a stable mood at present Reviewed with Carolyn Erickson the transport needs of client (daughter is supportive with transport needs) Encouraged client or Carolyn Erickson to call RNCM as needed for nursing support for client  Patient Self Care Activities:  Self administers medications as prescribed Attends all scheduled provider appointments Performs ADL's independently  Patient Coping Strengths:  Family Friends Hopefulness  Patient Self Care Deficits:  Lacks Audiological scientist needs occasionally  Patient Goals:  - spend time or talk with others every day for next 30 day - practice relaxation or meditation daily for next 30 days - keep a calendar with appointment dates for next 30 days  Follow Up Plan: LCSW to call client or Carolyn Erickson on 10/24/21 at 3:00 PM to  assess client needs      [name].Harveer Sadler MSW, LCSW Licensed Clinical Social Worker [facility] Care Management [phone]

## 2021-09-28 ENCOUNTER — Ambulatory Visit (INDEPENDENT_AMBULATORY_CARE_PROVIDER_SITE_OTHER): Payer: Medicare HMO | Admitting: Family Medicine

## 2021-09-28 ENCOUNTER — Encounter: Payer: Self-pay | Admitting: Family Medicine

## 2021-09-28 DIAGNOSIS — Z91199 Patient's noncompliance with other medical treatment and regimen due to unspecified reason: Secondary | ICD-10-CM

## 2021-09-28 NOTE — Progress Notes (Signed)
Patient was called at 32.  Female who did not identify himself said that she was unavailable because she was asleep.  He declined to awaken her.  He said he called back to see if we could see her in person later in the day.  No Show Claretta Fraise, M.D.

## 2021-10-03 DIAGNOSIS — I10 Essential (primary) hypertension: Secondary | ICD-10-CM

## 2021-10-03 DIAGNOSIS — F339 Major depressive disorder, recurrent, unspecified: Secondary | ICD-10-CM | POA: Diagnosis not present

## 2021-10-03 DIAGNOSIS — E782 Mixed hyperlipidemia: Secondary | ICD-10-CM | POA: Diagnosis not present

## 2021-10-04 ENCOUNTER — Other Ambulatory Visit: Payer: Self-pay | Admitting: Family Medicine

## 2021-10-04 DIAGNOSIS — M5136 Other intervertebral disc degeneration, lumbar region: Secondary | ICD-10-CM

## 2021-10-04 DIAGNOSIS — M5030 Other cervical disc degeneration, unspecified cervical region: Secondary | ICD-10-CM

## 2021-10-04 DIAGNOSIS — I10 Essential (primary) hypertension: Secondary | ICD-10-CM

## 2021-10-24 ENCOUNTER — Telehealth: Payer: Medicare HMO

## 2021-11-02 ENCOUNTER — Other Ambulatory Visit: Payer: Self-pay | Admitting: Family Medicine

## 2021-11-02 DIAGNOSIS — I10 Essential (primary) hypertension: Secondary | ICD-10-CM

## 2021-11-24 ENCOUNTER — Encounter: Payer: Self-pay | Admitting: Family Medicine

## 2021-11-24 ENCOUNTER — Ambulatory Visit (INDEPENDENT_AMBULATORY_CARE_PROVIDER_SITE_OTHER): Payer: Medicare HMO | Admitting: Family Medicine

## 2021-11-24 VITALS — BP 138/74 | HR 77 | Ht 61.0 in | Wt 112.0 lb

## 2021-11-24 DIAGNOSIS — F339 Major depressive disorder, recurrent, unspecified: Secondary | ICD-10-CM | POA: Diagnosis not present

## 2021-11-24 MED ORDER — BUPROPION HCL ER (SR) 150 MG PO TB12: 150.0000 mg | ORAL_TABLET | Freq: Two times a day (BID) | ORAL | 1 refills | Status: DC

## 2021-11-24 NOTE — Progress Notes (Signed)
BP 138/74    Pulse 77    Ht 5\' 1"  (1.549 m)    Wt 112 lb (50.8 kg)    SpO2 98%    BMI 21.16 kg/m    Subjective:   Patient ID: Carolyn Erickson, female    DOB: July 11, 1946, 75 y.o.   MRN: [id number]  HPI: Carolyn Erickson is a 75 y.o. female presenting on 11/24/2021 for Medical Management of Chronic Issues and Depression (Discuss increasing depression medications)   HPI Depression and anxiety Patient is currently taking Effexor 37.5 mg twice daily and Wellbutrin 100 mg twice daily.  Patient feels like her depression has been increasing and she has been more irritable and sleeping more and having less energy and that this is been gradually increasing over the past months.  She did have a brother that passed away and is also had her son diagnosed with stage IV cancer earlier in the year but she does not necessarily know if these are attributing causes or if it is just happening.  She denies any suicidal ideations or thoughts of hurting herself today Depression screen [facility] 2/9 11/24/2021 09/05/2021 08/04/2021 07/28/2021 06/29/2021  Decreased Interest 2 1 0 0 2  Down, Depressed, Hopeless 2 1 0 0 0  PHQ - 2 Score 4 2 0 0 2  Altered sleeping 2 0 0 0 3  Tired, decreased energy 3 2 3 2 3   Change in appetite 3 0 0 0 0  Feeling bad or failure about yourself  0 0 0 2 0  Trouble concentrating 0 1 0 0 0  Moving slowly or fidgety/restless 0 1 0 0 0  Suicidal thoughts 0 0 0 0 0  PHQ-9 Score 12 6 3 4 8   Difficult doing work/chores - Somewhat difficult Not difficult at all Not difficult at all Not difficult at all  Some recent data might be hidden     Relevant past medical, surgical, family and social history reviewed and updated as indicated. Interim medical history since our last visit reviewed. Allergies and medications reviewed and updated.  Review of Systems  Constitutional:  Negative for chills and fever.  Respiratory:  Negative for chest tightness and shortness of breath.   Cardiovascular:  Negative  for chest pain and leg swelling.  Neurological:  Negative for light-headedness and headaches.  Psychiatric/Behavioral:  Positive for dysphoric mood and sleep disturbance. Negative for agitation, behavioral problems, self-injury and suicidal ideas. The patient is nervous/anxious.   All other systems reviewed and are negative.  Per HPI unless specifically indicated above   Allergies as of 11/24/2021   No Known Allergies      Medication List        Accurate as of November 24, 2021  9:27 AM. If you have any questions, ask your nurse or doctor.          STOP taking these medications    buPROPion 100 MG tablet Commonly known as: WELLBUTRIN Replaced by: buPROPion 150 MG 12 hr tablet Stopped by: Fransisca Kaufmann Estefanie Cornforth, MD       TAKE these medications    amLODipine 2.5 MG tablet Commonly known as: NORVASC Take 1 tablet (2.5 mg total) by mouth daily.   atorvastatin 20 MG tablet Commonly known as: LIPITOR Take 1 tablet (20 mg total) by mouth daily.   buPROPion 150 MG 12 hr tablet Commonly known as: WELLBUTRIN SR Take 1 tablet (150 mg total) by mouth 2 (two) times daily. Replaces: buPROPion 100 MG tablet Started by:  Fransisca Kaufmann Francis Yardley, MD   cholecalciferol 25 MCG (1000 UNIT) tablet Commonly known as: VITAMIN D3 Take 1,000 Units by mouth daily.   meloxicam 7.5 MG tablet Commonly known as: MOBIC TAKE 1 TABLET EVERY DAY   venlafaxine 37.5 MG tablet Commonly known as: EFFEXOR Take 1 tablet (37.5 mg total) by mouth 2 (two) times daily.         Objective:   BP 138/74    Pulse 77    Ht 5\' 1"  (1.549 m)    Wt 112 lb (50.8 kg)    SpO2 98%    BMI 21.16 kg/m   Wt Readings from Last 3 Encounters:  11/24/21 112 lb (50.8 kg)  08/04/21 116 lb (52.6 kg)  07/28/21 117 lb (53.1 kg)    Physical Exam Vitals and nursing note reviewed.  Constitutional:      General: She is not in acute distress.    Appearance: She is well-developed. She is not diaphoretic.  Eyes:      Conjunctiva/sclera: Conjunctivae normal.  Skin:    General: Skin is warm and dry.     Findings: No rash.  Neurological:     Mental Status: She is alert and oriented to person, place, and time.     Coordination: Coordination normal.  Psychiatric:        Attention and Perception: She is attentive.        Mood and Affect: Mood is anxious and depressed.        Behavior: Behavior normal. Behavior is not agitated.        Thought Content: Thought content normal. Thought content does not include suicidal ideation. Thought content does not include suicidal plan.      Assessment & Plan:   Problem List Items Addressed This Visit       Other   Depression, recurrent ([place]) - Primary   Relevant Medications   buPROPion (WELLBUTRIN SR) 150 MG 12 hr tablet  Increase Wellbutrin to 150 mg twice daily  Follow up plan: Return if symptoms worsen or fail to improve, for 1 to 21-month follow-up for her depression, I believe she already has an appointment in 1 to 2 months.  Counseling provided for all of the vaccine components No orders of the defined types were placed in this encounter.   Caryl Pina, MD [facility] 11/24/2021, 9:27 AM

## 2021-11-27 IMAGING — US US THYROID
1 series · 13 of 25 positions shown · non-contrast
Comparison: None.

CLINICAL DATA: 74-year-old female with a history of thyroid nodules

EXAM:
THYROID ULTRASOUND
TECHNIQUE: Ultrasound examination of the thyroid gland and adjacent soft
tissues was performed.

[Series 1: us thyroid · 13 of 41 slices shown]
[im 1/41]
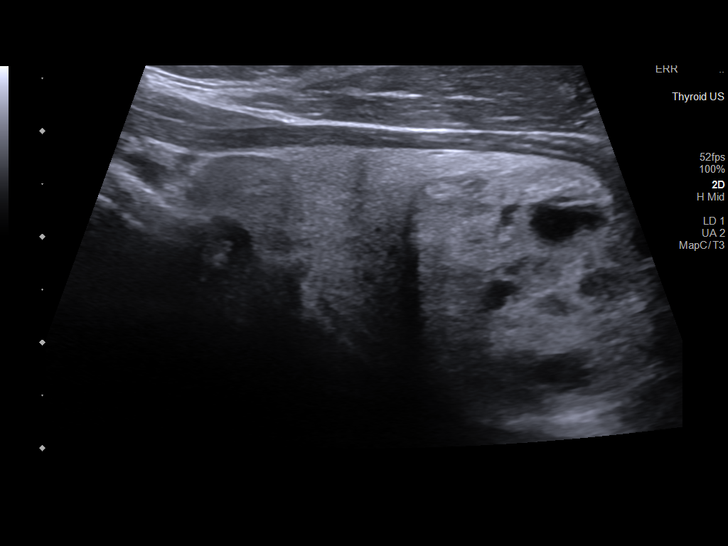
[im 4/41]
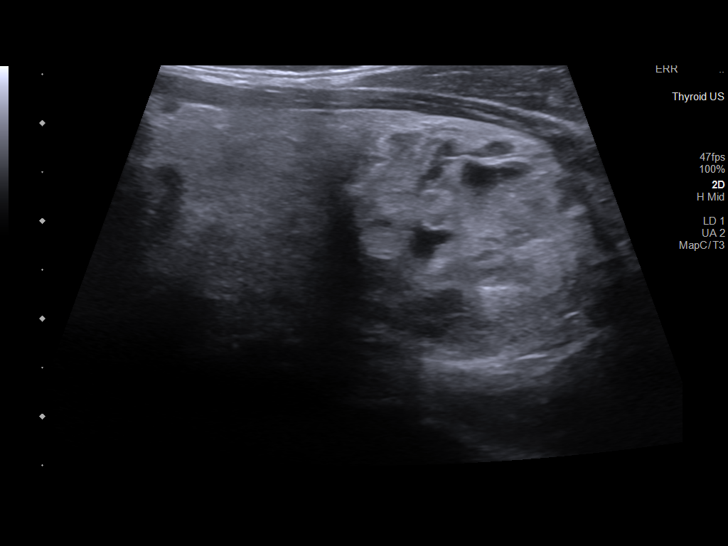
[im 7/41]
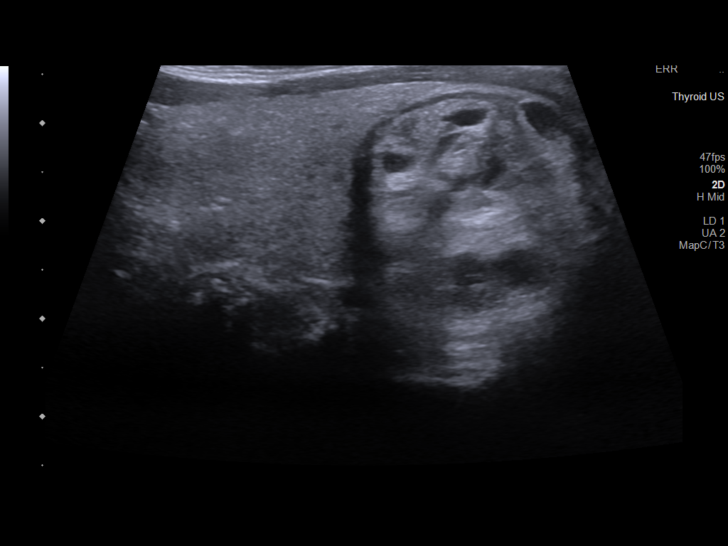
[im 11/41]
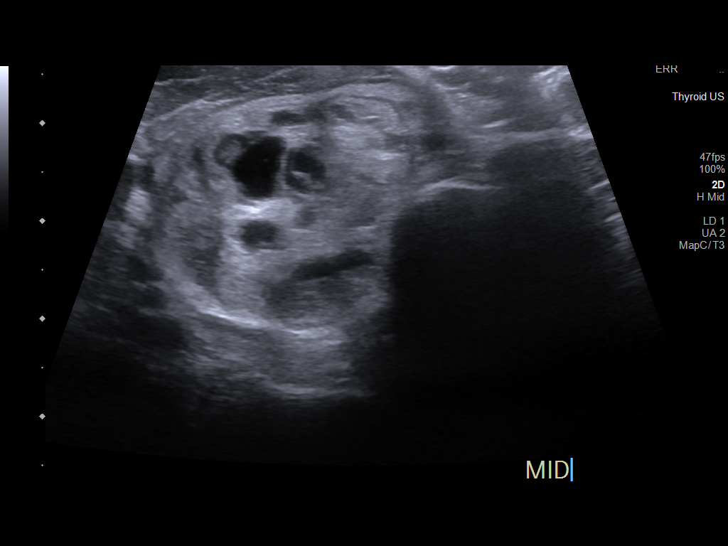
[im 14/41]
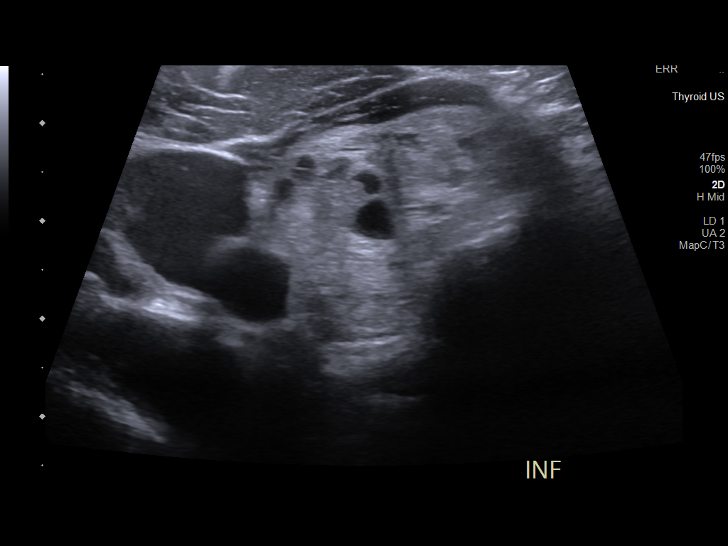
[im 17/41]
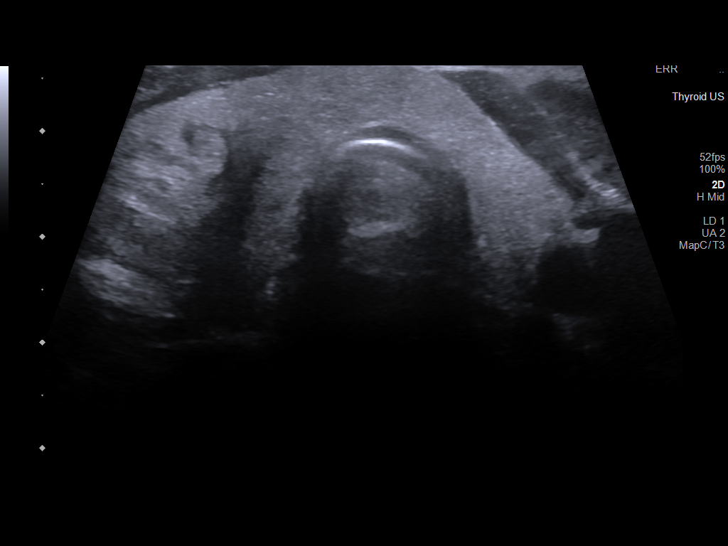
[im 21/41]
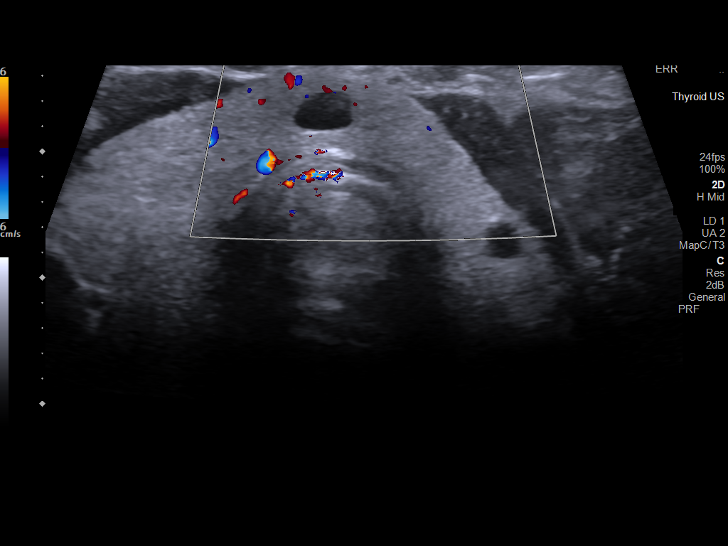
[im 24/41]
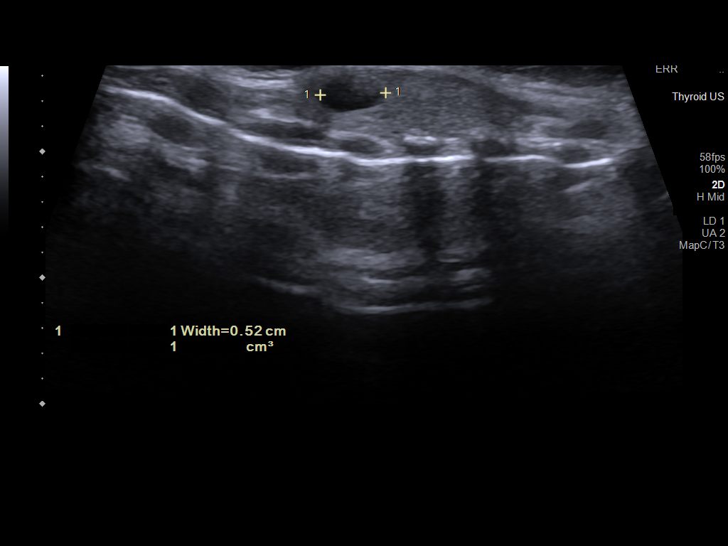
[im 27/41]
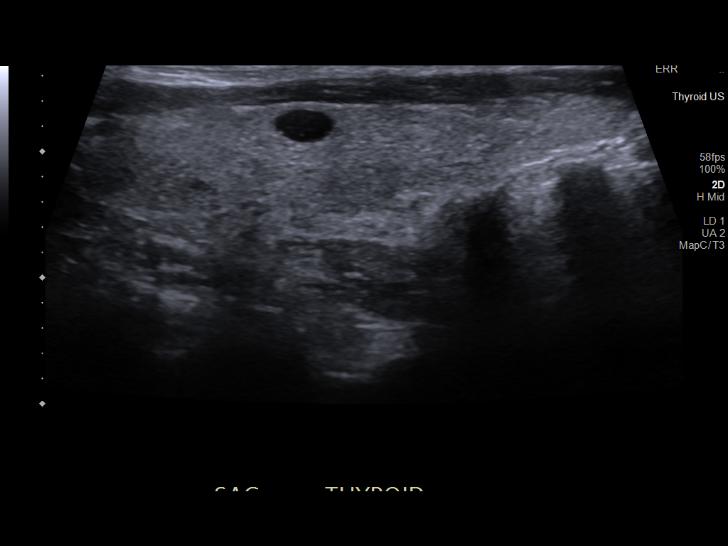
[im 31/41]
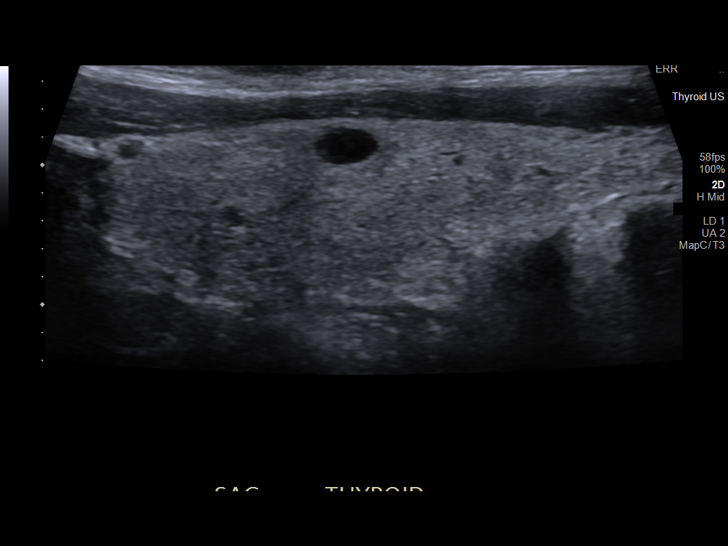
[im 34/41]
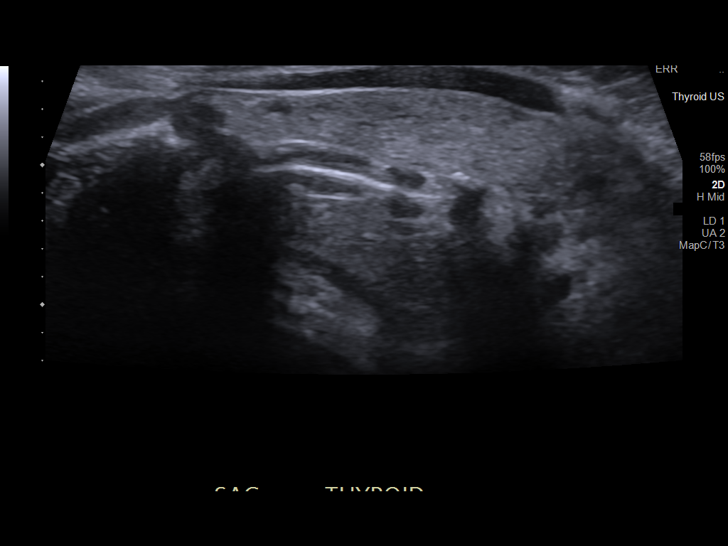
[im 37/41]
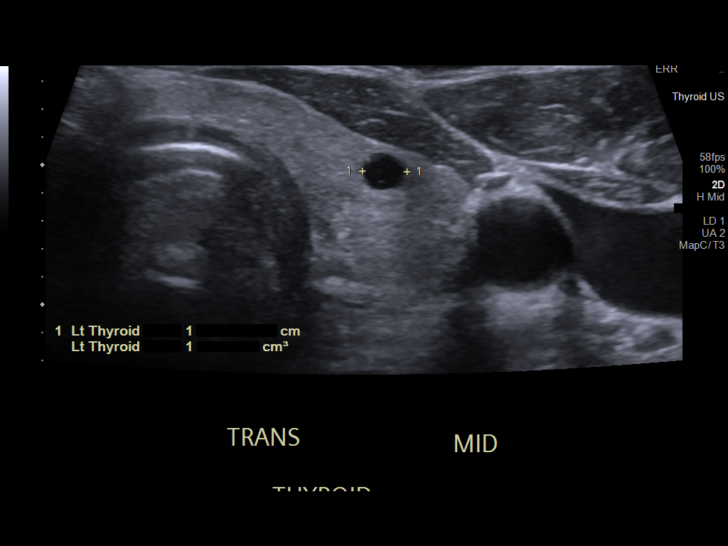
[im 41/41]
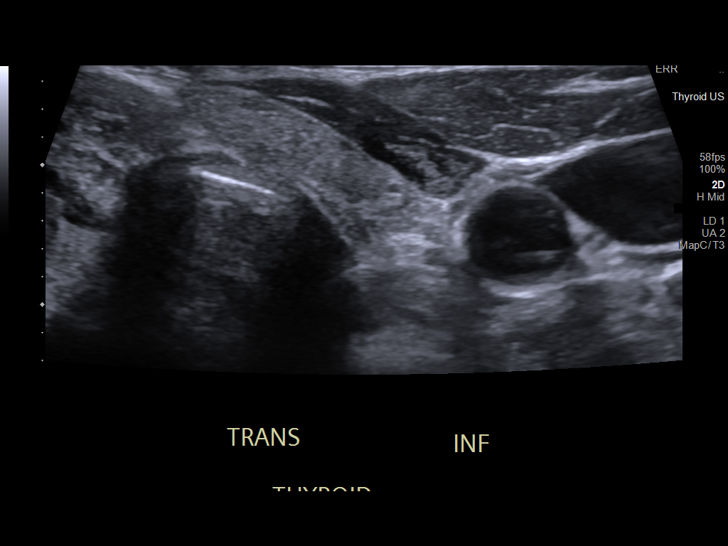

[13 of 25 positions shown; findings below may reference images not displayed]

FINDINGS: Parenchymal Echotexture: Mildly heterogenous

Isthmus: 0.5 cm

Right lobe: 5.4 cm x 2.6 cm x 2.8 cm

Left lobe: 4.3 cm x 1.2 cm x 1.2 cm

_________________________________________________________

Estimated total number of nodules >/= 1 cm: 1

Number of spongiform nodules >/=  2 cm not described below (TR1): 0

Number of mixed cystic and solid nodules >/= 1.5 cm not described
below (TR2): 0

_________________________________________________________

Nodule # 1:

Location: Right; Inferior

Maximum size: 2.4 cm; Other 2 dimensions: 2.4 cm x 2.2 cm

Composition: mixed cystic and solid (1)

Echogenicity: hypoechoic (2)

Shape: not taller-than-wide (0)

Margins: ill-defined (0)

Echogenic foci: none (0)

ACR TI-RADS total points: 3.

ACR TI-RADS risk category: TR3 (3 points).

ACR TI-RADS recommendations:

Nodule meets criteria for surveillance

_________________________________________________________

Nodule # 2:

Location: Isthmus; mid

Maximum size: 0.5 cm; Other 2 dimensions: 0.3 cm x 0.5 cm

Composition: cystic/almost completely cystic (0)

Echogenicity: anechoic (0)

Shape: not taller-than-wide (0)

Margins: smooth (0)

Echogenic foci: none (0)

ACR TI-RADS total points: 0.

ACR TI-RADS risk category: TR1 (0-1 points).

ACR TI-RADS recommendations:

Cystic nodule does not meet criteria for surveillance or biopsy

_________________________________________________________

Nodule # 3:

Location: Left; Mid

Maximum size: 0.5 cm; Other 2 dimensions: 0.3 cm x 0.3 cm

Composition: cystic/almost completely cystic (0)

Echogenicity: anechoic (0)

Shape: not taller-than-wide (0)

Margins: smooth (0)

Echogenic foci: none (0)

ACR TI-RADS total points: 0.

ACR TI-RADS risk category: TR1 (0-1 points).

ACR TI-RADS recommendations:

Cystic nodule does not meet criteria for surveillance or biopsy

_________________________________________________________

No adenopathy
IMPRESSION: Mildly heterogeneous thyroid may indicate medical thyroid disease.

Right lower thyroid nodule (labeled 1, 2.4 cm, TR 3) meets criteria
for surveillance, as designated by the newly established ACR TI-RADS
criteria. Surveillance ultrasound study recommended to be performed
annually up to 5 years.

Recommendations follow those established by the new ACR TI-RADS
criteria ([HOSPITAL] 8181;[DATE]).

## 2021-12-08 DIAGNOSIS — S0003XA Contusion of scalp, initial encounter: Secondary | ICD-10-CM | POA: Diagnosis not present

## 2021-12-08 DIAGNOSIS — S0990XA Unspecified injury of head, initial encounter: Secondary | ICD-10-CM | POA: Diagnosis not present

## 2021-12-14 ENCOUNTER — Encounter: Payer: Medicare HMO | Admitting: Psychology

## 2021-12-21 ENCOUNTER — Other Ambulatory Visit: Payer: Self-pay | Admitting: Family Medicine

## 2021-12-21 DIAGNOSIS — E782 Mixed hyperlipidemia: Secondary | ICD-10-CM

## 2021-12-21 DIAGNOSIS — I10 Essential (primary) hypertension: Secondary | ICD-10-CM

## 2021-12-21 DIAGNOSIS — F339 Major depressive disorder, recurrent, unspecified: Secondary | ICD-10-CM

## 2021-12-21 MED ORDER — AMLODIPINE BESYLATE 2.5 MG PO TABS: 2.5000 mg | ORAL_TABLET | Freq: Every day | ORAL | 0 refills | Status: DC

## 2021-12-21 NOTE — Addendum Note (Signed)
Addended by: Antonietta Barcelona D on: 12/21/2021 02:33 PM   Modules accepted: Orders

## 2021-12-23 ENCOUNTER — Ambulatory Visit (INDEPENDENT_AMBULATORY_CARE_PROVIDER_SITE_OTHER): Payer: Medicare HMO | Admitting: Licensed Clinical Social Worker

## 2021-12-23 DIAGNOSIS — M5136 Other intervertebral disc degeneration, lumbar region: Secondary | ICD-10-CM

## 2021-12-23 DIAGNOSIS — E782 Mixed hyperlipidemia: Secondary | ICD-10-CM

## 2021-12-23 DIAGNOSIS — I10 Essential (primary) hypertension: Secondary | ICD-10-CM

## 2021-12-23 DIAGNOSIS — F339 Major depressive disorder, recurrent, unspecified: Secondary | ICD-10-CM

## 2021-12-23 NOTE — Patient Instructions (Addendum)
Visit Information  Patient Goals:  Manage My Emotions (Patient).  Needs help in managing depression and depression symptoms  Timeframe:  Short-Term Goal Priority:  Medium Progress: On Track Start Date:    12/23/21                         Expected End Date:    03/21/22                  Follow Up Date   02/16/22 at 3:30 PM   Manage My Emotions (Patient): needs help in managing depression and depression symptoms     Why is this important?   When you are stressed, down or upset, your body reacts too.  For example, your blood pressure may get higher; you may have a headache or stomachache.  When your emotions get the best of you, your body's ability to fight off cold and flu gets weak.  These steps will help you manage your emotions.    Patient Self Care Activities:  Self administers medications as prescribed Attends all scheduled provider appointments Performs ADL's independently  Patient Coping Strengths:  Supportive Relationships Family Friends Hopefulness  Patient Self Care Deficits:  Lacks Audiological scientist needs occasionally  Patient Goals:  - avoid negative self-talk - spend time or talk with others every day - practice relaxation or meditation daily - keep a calendar with appointment dates   Follow Up Plan:  LCSW to call client on 02/16/22 at 3:30 PM to assess client needs  [name].Deontrey Massi MSW, [place] Holiday representative [facility] Care Management [phone]

## 2021-12-23 NOTE — Chronic Care Management (AMB) (Signed)
Chronic Care Management    Clinical Social Work Note  12/23/2021 Name: Carolyn Erickson MRN: [id number] DOB: Dec 18, 1945  Carolyn Erickson is a 76 y.o. year old female who is a primary care patient of Dettinger, Fransisca Kaufmann, MD. The CCM team was consulted to assist the patient with chronic disease management and/or care coordination needs related to: [employer] .   Engaged with patient by telephone for follow up visit in response to provider referral for social work chronic care management and care coordination services.   Consent to Services:  The patient was given information about Chronic Care Management services, agreed to services, and gave verbal consent prior to initiation of services.  Please see initial visit note for detailed documentation.   Patient agreed to services and consent obtained.   Assessment: Review of patient past medical history, allergies, medications, and health status, including review of relevant consultants reports was performed today as part of a comprehensive evaluation and provision of chronic care management and care coordination services.     SDOH (Social Determinants of Health) assessments and interventions performed:  SDOH Interventions    Flowsheet Row Most Recent Value  SDOH Interventions   Stress Interventions Provide Counseling  [client has stress related to managing her medical needs.]  Depression Interventions/Treatment  Counseling        Advanced Directives Status: See [employer] application for related entries.  CCM Care Plan  No Known Allergies  Outpatient Encounter Medications as of 12/23/2021  Medication Sig   amLODipine (NORVASC) 2.5 MG tablet Take 1 tablet (2.5 mg total) by mouth daily.   atorvastatin (LIPITOR) 20 MG tablet TAKE 1 TABLET EVERY DAY   buPROPion (WELLBUTRIN SR) 150 MG 12 hr tablet Take 1 tablet (150 mg total) by mouth 2 (two) times daily.   cholecalciferol (VITAMIN D3) 25 MCG (1000 UNIT) tablet Take 1,000 Units by mouth  daily.   meloxicam (MOBIC) 7.5 MG tablet TAKE 1 TABLET EVERY DAY   venlafaxine (EFFEXOR) 37.5 MG tablet TAKE 1 TABLET TWICE DAILY   No facility-administered encounter medications on file as of 12/23/2021.    Patient Active Problem List   Diagnosis Date Noted   Thyroid nodule 02/10/2021   Affective disorder ([place]) 11/26/2019   BMI 22.0-22.9, adult 11/26/2019   Hx of adenomatous polyp of colon 11/26/2019   Personal history of breast cancer 11/26/2019   Skin irritation 11/26/2019   DDD (degenerative disc disease), lumbar 06/20/2019   Depression, recurrent ([place]) 05/29/2019   Hyperlipidemia 05/29/2019   Essential hypertension 05/29/2019   Cervical radiculopathy 01/11/2018    Conditions to be addressed/monitored: monitor client management of depression issues  Care Plan : Depression (Adult)  Updates made by Katha Cabal, LCSW since 12/23/2021 12:00 AM     Problem: Depression Identification (Depression)      Goal: Client will manage depression and depression symptoms faced over next 30 days   Start Date: 12/23/2021  Expected End Date: 03/21/2022  This Visit's Progress: On track  Recent Progress: On track  Priority: Medium  Note:   Current Barriers:  Chronic Mental Health needs related to depression and depression management Limited social support   Some walking challenges Suicidal Ideation/Homicidal Ideation: No  Clinical Social Work Goal(s):  patient will work with SW monthly by telephone or in person to reduce or manage symptoms related to depression and depression management patient will work with SW monthly to address concerns related to client completion of ADLs Patient will communicate with RNCM or LCSW as needed  for CCM support in next 30 days  Interventions:  1:1 collaboration with Dr. Vonna Kotyk Dettinger MD regarding development and update of comprehensive plan of care as evidenced by provider attestation and co-signature  Reviewed client needs with [name]. Reviewed appetite of client.  She said she is eating adequately Discussed upcoming medical appointments for client. She said she has an appointment with Dr. Warrick Parisian in February of 2023 [facility] client about her walking challenges. She said she is walking adequately.She said she has not had any recent falls Reviewed mood status of client. Client said she felt that her mood was stable. Her brother did pass away last Fall and this has made her sad. Also her son had health problems and this is concerning to [place].. But , she said overall she thought her mood was doing well. Reviewed  transport needs of client (daughter is supportive with transport needs) Encouraged client  to call RNCM as needed for nursing support for client Provided counseling support for client Discussed vision needs of client. She wears glasses to help her with vision.  Discussed energy of client. She said she has reduced energy. She said she feels tired even when she wakes up from resting Reviewed health needs of her spouse. She said that her spouse has knee pain issues. But, she said her spouse helps her as needed daily in the home  Patient Self Care Activities:  Self administers medications as prescribed Attends all scheduled provider appointments Performs ADL's independently  Patient Coping Strengths:  Family Friends Hopefulness  Patient Self Care Deficits:  Lacks Audiological scientist needs occasionally  Patient Goals:  - spend time or talk with others every day for next 30 day - practice relaxation or meditation daily for next 30 days - keep a calendar with appointment dates for next 30 days  Follow Up Plan: LCSW to call client on 02/16/22 at 3:30 PM to  assess client needs      [name].Ivan Maskell MSW, [place] Holiday representative [facility] Care Management [phone]

## 2021-12-29 ENCOUNTER — Ambulatory Visit (INDEPENDENT_AMBULATORY_CARE_PROVIDER_SITE_OTHER): Payer: Medicare HMO | Admitting: Family Medicine

## 2021-12-29 ENCOUNTER — Encounter: Payer: Self-pay | Admitting: Family Medicine

## 2021-12-29 ENCOUNTER — Ambulatory Visit (INDEPENDENT_AMBULATORY_CARE_PROVIDER_SITE_OTHER): Payer: Medicare HMO

## 2021-12-29 VITALS — BP 132/77 | HR 79 | Temp 97.5°F | Ht 61.0 in | Wt 109.0 lb

## 2021-12-29 DIAGNOSIS — I10 Essential (primary) hypertension: Secondary | ICD-10-CM | POA: Diagnosis not present

## 2021-12-29 DIAGNOSIS — W19XXXA Unspecified fall, initial encounter: Secondary | ICD-10-CM | POA: Diagnosis not present

## 2021-12-29 DIAGNOSIS — M542 Cervicalgia: Secondary | ICD-10-CM

## 2021-12-29 DIAGNOSIS — M47812 Spondylosis without myelopathy or radiculopathy, cervical region: Secondary | ICD-10-CM | POA: Diagnosis not present

## 2021-12-29 MED ORDER — PREDNISONE 10 MG PO TABS: ORAL_TABLET | ORAL | 0 refills | Status: DC

## 2021-12-29 MED ORDER — TIZANIDINE HCL 4 MG PO TABS: 4.0000 mg | ORAL_TABLET | Freq: Four times a day (QID) | ORAL | 1 refills | Status: DC | PRN

## 2021-12-29 NOTE — Progress Notes (Signed)
Subjective:  Patient ID: Carolyn Erickson, female    DOB: [birth year]  Age: 76 y.o. MRN: [id number]  CC: Neck Pain (Fall 12/27/21)   HPI Carolyn Erickson presents for pain after falling 2 days ago. She was in her garage. Turned and lost balance. Hit her head on brick. She was dazed, but denies LOC. Husband witnessed. Agrees.   Now pt. Can't move her neck very well due to pain on the right. 6-8/10 with movement.  Depression screen [facility] 2/9 12/29/2021 12/23/2021 11/24/2021  Decreased Interest 0 2 2  Down, Depressed, Hopeless 0 2 2  PHQ - 2 Score 0 4 4  Altered sleeping - 2 2  Tired, decreased energy - 2 3  Change in appetite - 1 3  Feeling bad or failure about yourself  - 0 0  Trouble concentrating - 1 0  Moving slowly or fidgety/restless - 1 0  Suicidal thoughts - 0 0  PHQ-9 Score - 11 12  Difficult doing work/chores - Somewhat difficult -  Some recent data might be hidden    History Sihaam has a past medical history of Cancer ([place]) (2001), Depression, and Hypertension.   She has a past surgical history that includes Abdominal hysterectomy and Back surgery.   Her family history includes Alzheimer's disease in her brother and father; Diabetes in her mother; Heart disease in her mother; Parkinson's disease in her sister.She reports that she has never smoked. She has never used smokeless tobacco. She reports that she does not drink alcohol and does not use drugs.    ROS Review of Systems  Constitutional:  Positive for activity change (due to neck pain).  HENT: Negative.    Eyes:  Negative for visual disturbance.  Respiratory:  Negative for shortness of breath.   Cardiovascular:  Negative for chest pain.  Musculoskeletal:  Positive for arthralgias and neck pain.   Objective:  BP 132/77    Pulse 79    Temp (!) 97.5 F (36.4 C)    Ht 5\' 1"  (1.549 m)    Wt 109 lb (49.4 kg)    SpO2 99%    BMI 20.60 kg/m   BP Readings from Last 3 Encounters:  12/29/21 132/77  11/24/21 138/74   07/28/21 139/85    Wt Readings from Last 3 Encounters:  12/29/21 109 lb (49.4 kg)  11/24/21 112 lb (50.8 kg)  08/04/21 116 lb (52.6 kg)     Physical Exam Constitutional:      General: She is not in acute distress.    Appearance: She is well-developed.  HENT:     Head: Normocephalic and atraumatic.  Eyes:     Conjunctiva/sclera: Conjunctivae normal.     Pupils: Pupils are equal, round, and reactive to light.  Cardiovascular:     Rate and Rhythm: Normal rate and regular rhythm.     Heart sounds: Normal heart sounds. No murmur heard. Pulmonary:     Effort: Pulmonary effort is normal. No respiratory distress.     Breath sounds: Normal breath sounds. No wheezing or rales.  Abdominal:     General: Bowel sounds are normal. There is no distension.     Palpations: Abdomen is soft.     Tenderness: There is no abdominal tenderness.  Musculoskeletal:        General: Tenderness (cervicalis at right with edema as well) present.     Cervical back: Neck supple. Spasms and tenderness present. No rigidity or torticollis. Decreased range of motion (for rotation - 10  degrees left, 20 degrees right).  Skin:    General: Skin is warm and dry.  Neurological:     Mental Status: She is alert and oriented to person, place, and time.     Deep Tendon Reflexes: Reflexes are normal and symmetric.  Psychiatric:        Behavior: Behavior normal.        Thought Content: Thought content normal.    C Spine XR - loss of lordosis, severe arthritis change.  No fx.   Assessment & Plan:   Anahli was seen today for neck pain.  Diagnoses and all orders for this visit:  Cervicalgia -     DG Cervical Spine Complete; Future  Fall, initial encounter  Essential hypertension  Other orders -     tiZANidine (ZANAFLEX) 4 MG tablet; Take 1 tablet (4 mg total) by mouth every 6 (six) hours as needed for muscle spasms. -     predniSONE (DELTASONE) 10 MG tablet; Take 5 daily for 2 days followed by 4,3,2 and 1 for  2 days each.       I am having Wallie Char. Machorro start on tiZANidine and predniSONE. I am also having her maintain her cholecalciferol, meloxicam, buPROPion, venlafaxine, atorvastatin, and amLODipine.  Allergies as of 12/29/2021   No Known Allergies      Medication List        Accurate as of December 29, 2021 11:59 PM. If you have any questions, ask your nurse or doctor.          amLODipine 2.5 MG tablet Commonly known as: NORVASC Take 1 tablet (2.5 mg total) by mouth daily.   atorvastatin 20 MG tablet Commonly known as: LIPITOR TAKE 1 TABLET EVERY DAY   buPROPion 150 MG 12 hr tablet Commonly known as: WELLBUTRIN SR Take 1 tablet (150 mg total) by mouth 2 (two) times daily.   cholecalciferol 25 MCG (1000 UNIT) tablet Commonly known as: VITAMIN D3 Take 1,000 Units by mouth daily.   meloxicam 7.5 MG tablet Commonly known as: MOBIC TAKE 1 TABLET EVERY DAY   predniSONE 10 MG tablet Commonly known as: DELTASONE Take 5 daily for 2 days followed by 4,3,2 and 1 for 2 days each. Started by: Claretta Fraise, MD   tiZANidine 4 MG tablet Commonly known as: ZANAFLEX Take 1 tablet (4 mg total) by mouth every 6 (six) hours as needed for muscle spasms. Started by: Claretta Fraise, MD   venlafaxine 37.5 MG tablet Commonly known as: EFFEXOR TAKE 1 TABLET TWICE DAILY         Follow-up: No follow-ups on file.  Claretta Fraise, M.D.

## 2022-01-03 DIAGNOSIS — E782 Mixed hyperlipidemia: Secondary | ICD-10-CM

## 2022-01-03 DIAGNOSIS — F339 Major depressive disorder, recurrent, unspecified: Secondary | ICD-10-CM

## 2022-01-03 DIAGNOSIS — I10 Essential (primary) hypertension: Secondary | ICD-10-CM

## 2022-01-10 ENCOUNTER — Telehealth: Payer: Self-pay | Admitting: Family Medicine

## 2022-01-12 ENCOUNTER — Telehealth: Payer: Self-pay

## 2022-01-12 NOTE — Telephone Encounter (Signed)
Pt grand daughter called stating pt is not speaking, cant walk, very weak. I told pt with these sx considering we do not have xray tomorrow she should not wait to be evaluated. Pt grandaughter states that she is not a dr but she does know a little bit and would like her to be scheduled with a provider so I scheduled her even after recommending she go to the er

## 2022-01-13 ENCOUNTER — Ambulatory Visit (HOSPITAL_COMMUNITY)
Admission: RE | Admit: 2022-01-13 | Discharge: 2022-01-13 | Disposition: A | Discharge status: Home / Self Care | Payer: Medicare HMO | Source: Ambulatory Visit | Attending: Nurse Practitioner | Admitting: Nurse Practitioner

## 2022-01-13 ENCOUNTER — Ambulatory Visit (INDEPENDENT_AMBULATORY_CARE_PROVIDER_SITE_OTHER): Payer: Medicare HMO | Admitting: Nurse Practitioner

## 2022-01-13 ENCOUNTER — Encounter: Payer: Self-pay | Admitting: Nurse Practitioner

## 2022-01-13 ENCOUNTER — Other Ambulatory Visit: Payer: Self-pay

## 2022-01-13 VITALS — BP 126/74 | HR 83 | Ht 61.0 in | Wt 111.0 lb

## 2022-01-13 DIAGNOSIS — R42 Dizziness and giddiness: Secondary | ICD-10-CM | POA: Insufficient documentation

## 2022-01-13 DIAGNOSIS — S0990XA Unspecified injury of head, initial encounter: Secondary | ICD-10-CM | POA: Insufficient documentation

## 2022-01-13 NOTE — Progress Notes (Signed)
Acute Office Visit  Subjective:    Patient ID: Carolyn Erickson, female    DOB: 03/02/1946, 76 y.o.   MRN: [id number]  Chief Complaint  Patient presents with   Fall      Fall The accident occurred 3 to 5 days ago. The fall occurred in unknown circumstances. She fell from an unknown height. There was no blood loss. The point of impact was the head. The pain is present in the head. The pain is moderate. Associated symptoms include headaches and a visual change. Pertinent negatives include no abdominal pain, bowel incontinence, fever, loss of consciousness, nausea, numbness, tingling or vomiting. She has tried nothing for the symptoms.  Dizziness This is a new problem. Episode onset: in the past few days. The problem occurs intermittently. The problem has been unchanged. Associated symptoms include headaches and a visual change. Pertinent negatives include no abdominal pain, chills, fever, nausea, numbness or vomiting. The symptoms are aggravated by walking. She has tried nothing for the symptoms.       Past Medical History:  Diagnosis Date   Cancer ([place]) 2001   breast left   Depression    Hypertension     Past Surgical History:  Procedure Laterality Date   ABDOMINAL HYSTERECTOMY     BACK SURGERY      Family History  Problem Relation Age of Onset   Diabetes Mother    Heart disease Mother    Alzheimer's disease Father    Parkinson's disease Sister    Alzheimer's disease Brother     Social History   Socioeconomic History   Marital status: Married    Spouse name: Dellis Filbert   Number of children: 3   Years of education: Not on file   Highest education level: Not on file  Occupational History   Occupation: retired  Tobacco Use   Smoking status: Never   Smokeless tobacco: Never  Vaping Use   Vaping Use: Never used  Substance and Sexual Activity   Alcohol use: Never   Drug use: Never   Sexual activity: Not on file    Comment: married for 70 years  Other Topics  Concern   Not on file  Social History Narrative   Has 3 children, 6 grandkids   Lives home with husband.   2 of her children live nearby.   Social Determinants of Health   Financial Resource Strain: Low Risk    Difficulty of Paying Living Expenses: Not hard at all  Food Insecurity: No Food Insecurity   Worried About Charity fundraiser in the Last Year: Never true   [place] in the Last Year: Never true  Transportation Needs: No Transportation Needs   Lack of Transportation (Medical): No   Lack of Transportation (Non-Medical): No  Physical Activity: Insufficiently Active   Days of Exercise per Week: 2 days   Minutes of Exercise per Session: 10 min  Stress: Stress Concern Present   Feeling of Stress : Rather much  Social Connections: Moderately Integrated   Frequency of Communication with Friends and Family: More than three times a week   Frequency of Social Gatherings with Friends and Family: Once a week   Attends Religious Services: More than 4 times per year   Active Member of [employer] or Organizations: No   Attends Archivist Meetings: Never   Marital Status: Married  Human resources officer Violence: Not At Risk   Fear of Current or Ex-Partner: No   Emotionally Abused: No  Physically Abused: No   Sexually Abused: No    Outpatient Medications Prior to Visit  Medication Sig Dispense Refill   amLODipine (NORVASC) 2.5 MG tablet Take 1 tablet (2.5 mg total) by mouth daily. 90 tablet 0   atorvastatin (LIPITOR) 20 MG tablet TAKE 1 TABLET EVERY DAY 90 tablet 0   buPROPion (WELLBUTRIN SR) 150 MG 12 hr tablet Take 1 tablet (150 mg total) by mouth 2 (two) times daily. 180 tablet 1   cholecalciferol (VITAMIN D3) 25 MCG (1000 UNIT) tablet Take 1,000 Units by mouth daily.     meloxicam (MOBIC) 7.5 MG tablet TAKE 1 TABLET EVERY DAY 90 tablet 3   venlafaxine (EFFEXOR) 37.5 MG tablet TAKE 1 TABLET TWICE DAILY 180 tablet 0   predniSONE (DELTASONE) 10 MG tablet Take 5 daily for 2  days followed by 4,3,2 and 1 for 2 days each. (Patient not taking: Reported on 01/13/2022) 30 tablet 0   tiZANidine (ZANAFLEX) 4 MG tablet Take 1 tablet (4 mg total) by mouth every 6 (six) hours as needed for muscle spasms. (Patient not taking: Reported on 01/13/2022) 30 tablet 1   No facility-administered medications prior to visit.    No Known Allergies  Review of Systems  Constitutional: Negative.  Negative for activity change, appetite change, chills and fever.  HENT: Negative.    Eyes:  Negative for redness.  Respiratory: Negative.    Cardiovascular: Negative.   Gastrointestinal:  Negative for abdominal pain, bowel incontinence, nausea and vomiting.  Neurological:  Positive for dizziness and headaches. Negative for tingling, loss of consciousness and numbness.  All other systems reviewed and are negative.     Objective:    Physical Exam Vitals and nursing note reviewed.  HENT:     Head: Normocephalic.     Right Ear: External ear normal.     Left Ear: External ear normal.     Mouth/Throat:     Mouth: Mucous membranes are moist.     Pharynx: Oropharynx is clear.  Eyes:     Conjunctiva/sclera: Conjunctivae normal.  Cardiovascular:     Rate and Rhythm: Normal rate and regular rhythm.     Pulses: Normal pulses.     Heart sounds: Normal heart sounds.  Abdominal:     General: Bowel sounds are normal.  Musculoskeletal:        General: Normal range of motion.  Skin:    General: Skin is warm.     Findings: No rash.  Neurological:     Mental Status: She is alert and oriented to person, place, and time.     Motor: No weakness.     Comments: dizzy  Psychiatric:        Mood and Affect: Mood normal.        Behavior: Behavior normal.    BP 126/74    Pulse 83    Ht 5' 1"  (1.549 m)    Wt 111 lb (50.3 kg)    SpO2 96%    BMI 20.97 kg/m  Wt Readings from Last 3 Encounters:  01/13/22 111 lb (50.3 kg)  12/29/21 109 lb (49.4 kg)  11/24/21 112 lb (50.8 kg)    There are no  preventive care reminders to display for this patient.  There are no preventive care reminders to display for this patient.   Lab Results  Component Value Date   TSH 1.610 06/29/2021   Lab Results  Component Value Date   WBC 10.1 06/29/2021   HGB 15.1 06/29/2021  HCT 45.0 06/29/2021   MCV 90 06/29/2021   PLT 282 06/29/2021   Lab Results  Component Value Date   NA 144 06/29/2021   K 4.9 06/29/2021   CO2 25 06/29/2021   GLUCOSE 118 (H) 06/29/2021   BUN 17 06/29/2021   CREATININE 1.04 (H) 06/29/2021   BILITOT 0.5 06/29/2021   ALKPHOS 82 06/29/2021   AST 20 06/29/2021   ALT 23 06/29/2021   PROT 7.0 06/29/2021   ALBUMIN 4.3 06/29/2021   CALCIUM 10.0 06/29/2021   EGFR 55 (L) 06/29/2021   Lab Results  Component Value Date   CHOL 191 01/28/2021   Lab Results  Component Value Date   HDL 94 01/28/2021   Lab Results  Component Value Date   LDLCALC 84 01/28/2021   Lab Results  Component Value Date   TRIG 74 01/28/2021   Lab Results  Component Value Date   CHOLHDL 2.0 01/28/2021   No results found for: HGBA1C     Assessment & Plan:   Problem List Items Addressed This Visit       Other   Dizzy    Worsening dizzy symptoms after patient fell.  Provided education on fall prevention.  Completed stat CT without contrast of head.  Tylenol for headache.      Head trauma - Primary    Patient fell and hit her head in the past few days.  Symptoms not well controlled.  Patient presents with dizziness and mild vision changes in left eye.  Head tenderness on palpation.  Patient describes pain as throbbing and mild to moderate.  Patient has not been evaluated. Completed stat CT of head without contrast..  Patient knows to follow-up with worsening hours of symptoms.      Relevant Orders   CT HEAD WO CONTRAST (5MM)     No orders of the defined types were placed in this encounter.    [name] Lynn, NP

## 2022-01-13 NOTE — Telephone Encounter (Signed)
Pt was seen today and sent to ER for CT scan. Granddaughter Vikki Ports called asking why no medication was prescribed for headache and muscle relaxer. Use [facility].

## 2022-01-13 NOTE — Patient Instructions (Signed)
Head Injury, Adult ?There are many types of head injuries. They can be as minor as a small bump. Some head injuries can be worse. Worse injuries include: ?A strong hit to the head that shakes the brain back and forth, causing damage (concussion). ?A bruise (contusion) of the brain. This means there is bleeding in the brain that can cause swelling. ?A cracked skull (skull fracture). ?Bleeding in the brain that gathers, gets thick (makes a clot), and forms a bump (hematoma). ?Most problems from a head injury come in the first 24 hours. However, you may still have side effects up to 7-10 days after your injury. It is important to watch your condition for any changes. You may need to be watched in the emergency department or urgent care, or you may need to stay in the hospital. ?What are the causes? ?There are many possible causes of a head injury. A serious head injury may be caused by: ?A car accident. ?Bicycle or motorcycle accidents. ?Sports injuries. ?Falls. ?Being hit by an object. ?What are the signs or symptoms? ?Symptoms of a head injury include a bruise, bump, or bleeding where the injury happened. Other physical symptoms may include: ?Headache. ?Feeling like you may vomit (nauseous) or vomiting. ?Dizziness. ?Blurred or double vision. ?Being uncomfortable around bright lights or loud noises. ?Shaking movements that you cannot control (seizures). ?Feeling tired. ?Trouble being woken up. ?Fainting or loss of consciousness. ?Mental or emotional symptoms may include: ?Feeling grumpy or cranky. ?Confusion and memory problems. ?Having trouble paying attention or concentrating. ?Changes in eating or sleeping habits. ?Feeling worried or nervous (anxious). ?Feeling sad (depressed). ?How is this treated? ?Treatment for this condition depends on how severe the injury is and the type of injury you have. The main goal is to prevent problems and to allow the brain time to heal. ?Mild head injury ?If you have a mild head  injury, you may be sent home, and treatment may include: ?Being watched. A responsible adult should stay with you for 24 hours after your injury and check on you often. ?Physical rest. ?Brain rest. ?Pain medicines. ?Severe head injury ?If you have a severe head injury, treatment may include: ?Being watched closely. This includes staying in the hospital. ?Medicines to: ?Help with pain. ?Prevent seizures. ?Help with brain swelling. ?Protecting your airway and using a machine that helps you breathe (ventilator). ?Treatments to watch for and manage swelling inside the brain. ?Brain surgery. This may be needed to: ?Remove a collection of blood or blood clots. ?Stop the bleeding. ?Remove a part of the skull. This allows room for the brain to swell. ?Follow these instructions at home: ?Activity ?Rest. ?Avoid activities that are hard or tiring. ?Make sure you get enough sleep. ?Let your brain rest. Do this by limiting activities that need a lot of thought or attention, such as: ?Watching TV. ?Playing memory games and puzzles. ?Job-related work or homework. ?Working on the computer, social media, and texting. ?Avoid activities that could cause another head injury until your doctor says it is okay. This includes playing sports. Having another head injury, especially before the first one has healed, can be dangerous. ?Ask your doctor when it is safe for you to go back to your normal activities, such as work or school. Ask your doctor for a step-by-step plan for slowly going back to your normal activities. ?Ask your doctor when you can drive, ride a bicycle, or use heavy machinery. Do not do these activities if you are dizzy. ?Lifestyle ? ?Do   not drink alcohol until your doctor says it is okay. ?Do not use drugs. ?If it is harder than usual to remember things, write them down. ?If you are easily distracted, try to do one thing at a time. ?Talk with family members or close friends when making important decisions. ?Tell your  friends, family, a trusted co-worker, and work manager about your injury, symptoms, and limits (restrictions). Have them watch for any problems that are new or getting worse. ?General instructions ?Take over-the-counter and prescription medicines only as told by your doctor. ?Have someone stay with you for 24 hours after your head injury. This person should watch you for any changes in your symptoms and be ready to get help. ?Keep all follow-up visits as told by your doctor. This is important. ?How is this prevented? ?Work on your balance and strength. This can help you avoid falls. ?Wear a seat belt when you are in a moving vehicle. ?Wear a helmet when you: ?Ride a bicycle. ?Ski. ?Do any other sport or activity that has a risk of injury. ?If you drink alcohol: ?Limit how much you use to: ?0-1 drink a day for nonpregnant women. ?0-2 drinks a day for men. ?Be aware of how much alcohol is in your drink. In the [place] one drink equals one 12 oz bottle of beer (355 mL), one 5 oz glass of wine (148 mL), or one 1? oz glass of hard liquor (44 mL). ?Make your home safer by: ?Getting rid of clutter from the floors and stairs. This includes things that can make you trip. ?Using grab bars in bathrooms and handrails by stairs. ?Placing non-slip mats on floors and in bathtubs. ?Putting more light in dim areas. ?Where to find more information ?Centers for Disease Control and Prevention: www.cdc.gov ?Get help right away if: ?You have: ?A very bad headache that is not helped by medicine. ?Trouble walking or weakness in your arms and legs. ?Clear or bloody fluid coming from your nose or ears. ?Changes in how you see (vision). ?A seizure. ?More confusion or more grumpy moods. ?Your symptoms get worse. ?You are sleepier than normal and have trouble staying awake. ?You lose your balance. ?The black centers of your eyes (pupils) change in size. ?Your speech is slurred. ?Your dizziness gets worse. ?You vomit. ?These symptoms may be an  emergency. Do not wait to see if the symptoms will go away. Get medical help right away. Call your local emergency services (911 in the [place] Do not drive yourself to the hospital. ?Summary ?Head injuries can be as minor as a small bump. Some head injuries can be worse. ?Treatment for this condition depends on how severe the injury is and the type of injury you have. ?Have someone stay with you for 24 hours after your head injury. ?Ask your doctor when it is safe for you to go back to your normal activities, such as work or school. ?To prevent a head injury, wear a seat belt in a car, wear a helmet when you use a bicycle, limit your alcohol use, and make your home safer. ?This information is not intended to replace advice given to you by your health care provider. Make sure you discuss any questions you have with your health care provider. ?Document Revised: 10/03/2019 Document Reviewed: 10/03/2019 ?Elsevier Patient Education ? 2022 Elsevier Inc. ? ?

## 2022-01-13 NOTE — Assessment & Plan Note (Signed)
Worsening dizzy symptoms after patient fell.  Provided education on fall prevention.  Completed stat CT without contrast of head.  Tylenol for headache.

## 2022-01-13 NOTE — Telephone Encounter (Signed)
Okay I agree with you that the ER likely would have been better but hopefully she can be seen today

## 2022-01-13 NOTE — Assessment & Plan Note (Signed)
Patient fell and hit her head in the past few days.  Symptoms not well controlled.  Patient presents with dizziness and mild vision changes in left eye.  Head tenderness on palpation.  Patient describes pain as throbbing and mild to moderate.  Patient has not been evaluated. Completed stat CT of head without contrast..  Patient knows to follow-up with worsening hours of symptoms.

## 2022-01-13 NOTE — Telephone Encounter (Signed)
It looks like just saw her so I would send this to her because I did not see her.

## 2022-01-16 NOTE — Telephone Encounter (Signed)
I spoke to the pt's husband and he says pt is still having a headache but it is still there.The tylenol 500mg  which does help with the headache. The CT has resulted. The spouse is still concerned that she has had the headache for this long and wants to know if there is anything else they need to do.

## 2022-01-16 NOTE — Telephone Encounter (Signed)
Pt's husband aware of provider feedback and he voiced understanding.

## 2022-01-23 NOTE — Telephone Encounter (Signed)
Spoke with pt. She feels that her pain is coming from a HA. OTC meds help some. Denies N&V. Pt will discuss at appt 2/27

## 2022-01-30 ENCOUNTER — Ambulatory Visit (INDEPENDENT_AMBULATORY_CARE_PROVIDER_SITE_OTHER): Payer: Medicare HMO | Admitting: Family Medicine

## 2022-01-30 ENCOUNTER — Encounter: Payer: Self-pay | Admitting: Family Medicine

## 2022-01-30 VITALS — BP 142/81 | HR 84 | Ht 61.0 in | Wt 111.0 lb

## 2022-01-30 DIAGNOSIS — E782 Mixed hyperlipidemia: Secondary | ICD-10-CM | POA: Diagnosis not present

## 2022-01-30 DIAGNOSIS — Z23 Encounter for immunization: Secondary | ICD-10-CM

## 2022-01-30 DIAGNOSIS — E041 Nontoxic single thyroid nodule: Secondary | ICD-10-CM

## 2022-01-30 DIAGNOSIS — F339 Major depressive disorder, recurrent, unspecified: Secondary | ICD-10-CM | POA: Diagnosis not present

## 2022-01-30 DIAGNOSIS — F0781 Postconcussional syndrome: Secondary | ICD-10-CM | POA: Diagnosis not present

## 2022-01-30 DIAGNOSIS — I10 Essential (primary) hypertension: Secondary | ICD-10-CM | POA: Diagnosis not present

## 2022-01-30 LAB — LIPID PANEL

## 2022-01-30 MED ORDER — AMLODIPINE BESYLATE 2.5 MG PO TABS: 2.5000 mg | ORAL_TABLET | Freq: Every day | ORAL | 3 refills | Status: DC

## 2022-01-30 MED ORDER — ATORVASTATIN CALCIUM 20 MG PO TABS: 20.0000 mg | ORAL_TABLET | Freq: Every day | ORAL | 3 refills | Status: AC

## 2022-01-30 MED ORDER — VENLAFAXINE HCL 37.5 MG PO TABS: 37.5000 mg | ORAL_TABLET | Freq: Two times a day (BID) | ORAL | 3 refills | Status: AC

## 2022-01-30 MED ORDER — CYCLOBENZAPRINE HCL 10 MG PO TABS
10.0000 mg | ORAL_TABLET | Freq: Every evening | ORAL | 3 refills | Status: DC | PRN
Start: 2022-01-30 — End: 2022-06-24

## 2022-01-30 MED ORDER — BUPROPION HCL ER (SR) 150 MG PO TB12: 150.0000 mg | ORAL_TABLET | Freq: Two times a day (BID) | ORAL | 3 refills | Status: AC

## 2022-01-30 NOTE — Addendum Note (Signed)
Addended by: Alphonzo Dublin on: 01/30/2022 09:55 AM   Modules accepted: Orders

## 2022-01-30 NOTE — Progress Notes (Signed)
BP (!) 142/81    Pulse 84    Ht 5' 1" (1.549 m)    Wt 111 lb (50.3 kg)    SpO2 99%    BMI 20.97 kg/m    Subjective:   Patient ID: Carolyn Erickson, female    DOB: September 23, 1946, 76 y.o.   MRN: [id number]  HPI: Carolyn Erickson is a 76 y.o. female presenting on 01/30/2022 for Medical Management of Chronic Issues, Hyperlipidemia, and Hypertension   HPI Hypertension Patient is currently on amlodipine, and their blood pressure today is 142/81. Patient denies any lightheadedness or dizziness. Patient denies headaches, blurred vision, chest pains, shortness of breath, or weakness. Denies any side effects from medication and is content with current medication.   Hyperlipidemia Patient is coming in for recheck of his hyperlipidemia. The patient is currently taking atorvastatin. They deny any issues with myalgias or history of liver damage from it. They deny any focal numbness or weakness or chest pain.   Depression and mood Patient is coming in for depression and mood recheck.  She currently takes Wellbutrin and Effexor.  She feels like those are doing very well.  Her granddaughter is here with her today and says she agrees with that.  Patient has had recurrent falls over the past few months but one more recently about a month ago she fell and hit her head and has been a little more sleepy and groggy since that time.  She does complain of still some soreness in the back of her head and neck which she has had before but has been worse since the fall.  She did have a scan.  Her granddaughter who is here with her says she still having a lot of balance issues and she is concerned about recurrent falls.  Relevant past medical, surgical, family and social history reviewed and updated as indicated. Interim medical history since our last visit reviewed. Allergies and medications reviewed and updated.  Review of Systems  Constitutional:  Negative for chills and fever.  Eyes:  Negative for visual disturbance.   Respiratory:  Negative for chest tightness and shortness of breath.   Cardiovascular:  Negative for chest pain and leg swelling.  Musculoskeletal:  Positive for gait problem. Negative for back pain.  Skin:  Negative for rash.  Neurological:  Positive for weakness. Negative for dizziness, speech difficulty, light-headedness, numbness and headaches.  Psychiatric/Behavioral:  Negative for agitation and behavioral problems.   All other systems reviewed and are negative.  Per HPI unless specifically indicated above   Allergies as of 01/30/2022   No Known Allergies      Medication List        Accurate as of January 30, 2022  8:44 AM. If you have any questions, ask your nurse or doctor.          amLODipine 2.5 MG tablet Commonly known as: NORVASC Take 1 tablet (2.5 mg total) by mouth daily.   atorvastatin 20 MG tablet Commonly known as: LIPITOR Take 1 tablet (20 mg total) by mouth daily.   buPROPion 150 MG 12 hr tablet Commonly known as: WELLBUTRIN SR Take 1 tablet (150 mg total) by mouth 2 (two) times daily.   cholecalciferol 25 MCG (1000 UNIT) tablet Commonly known as: VITAMIN D3 Take 1,000 Units by mouth daily.   cyclobenzaprine 10 MG tablet Commonly known as: FLEXERIL Take 1 tablet (10 mg total) by mouth at bedtime as needed for muscle spasms (neck pain). Started by: Fransisca Kaufmann Emerald Shor,  meloxicam 7.5 MG tablet °Commonly known as: MOBIC °TAKE 1 TABLET EVERY DAY °  °venlafaxine 37.5 MG tablet °Commonly known as: EFFEXOR °Take 1 tablet (37.5 mg total) by mouth 2 (two) times daily. °  ° °  ° ° ° °Objective:  ° °BP (!) 142/81    Pulse 84    Ht 5' 1" (1.549 m)    Wt 111 lb (50.3 kg)    SpO2 99%    BMI 20.97 kg/m²   °Wt Readings from Last 3 Encounters:  °01/30/22 111 lb (50.3 kg)  °01/13/22 111 lb (50.3 kg)  °12/29/21 109 lb (49.4 kg)  °  °Physical Exam °Vitals and nursing note reviewed.  °Constitutional:   °   General: She is not in acute distress. °   Appearance: She is  well-developed. She is not diaphoretic.  °Eyes:  °   Conjunctiva/sclera: Conjunctivae normal.  °Cardiovascular:  °   Rate and Rhythm: Normal rate and regular rhythm.  °   Heart sounds: Normal heart sounds. No murmur heard. °Pulmonary:  °   Effort: Pulmonary effort is normal. No respiratory distress.  °   Breath sounds: Normal breath sounds. No wheezing.  °Musculoskeletal:     °   General: No swelling or tenderness. Normal range of motion.  °Skin: °   General: Skin is warm and dry.  °   Findings: No rash.  °Neurological:  °   Mental Status: She is alert and oriented to person, place, and time.  °   Coordination: Coordination normal.  °Psychiatric:     °   Behavior: Behavior normal.  ° ° ° ° °Assessment & Plan:  ° °Problem List Items Addressed This Visit   ° °  ° Cardiovascular and Mediastinum  ° Essential hypertension  ° Relevant Medications  ° amLODipine (NORVASC) 2.5 MG tablet  ° atorvastatin (LIPITOR) 20 MG tablet  ° Other Relevant Orders  ° CMP14+EGFR  °  ° Endocrine  ° Thyroid nodule  ° Relevant Orders  ° US THYROID  ° TSH  °  ° Other  ° Depression, recurrent (HCC) - Primary  ° Relevant Medications  ° buPROPion (WELLBUTRIN SR) 150 MG 12 hr tablet  ° venlafaxine (EFFEXOR) 37.5 MG tablet  ° Other Relevant Orders  ° CBC with Differential/Platelet  ° Hyperlipidemia  ° Relevant Medications  ° amLODipine (NORVASC) 2.5 MG tablet  ° atorvastatin (LIPITOR) 20 MG tablet  ° Other Relevant Orders  ° CMP14+EGFR  ° Lipid panel  ° °Other Visit Diagnoses   ° ° Post concussion syndrome      ° Relevant Medications  ° cyclobenzaprine (FLEXERIL) 10 MG tablet  ° °  °Granddaughter will do strengthening and balance exercises with her.  We discussed physical therapy but they want to try on their own first. ° °Monitor for any severe concussion symptoms or if any worsening.  Instructed that it should gradually improve but to notify us if anything changes ° °Continue other medicine, will check blood work. ° °Follow up plan: °Return in  about 6 months (around 07/30/2022), or if symptoms worsen or fail to improve, for Thyroid blood pressure and depression recheck. ° °Counseling provided for all of the vaccine components °Orders Placed This Encounter  °Procedures  ° US THYROID  ° CBC with Differential/Platelet  ° CMP14+EGFR  ° Lipid panel  ° TSH  ° ° ° , MD °[facility] °01/30/2022, 8:44 AM ° ° ° ° °

## 2022-01-31 ENCOUNTER — Telehealth: Payer: Self-pay | Admitting: Family Medicine

## 2022-01-31 LAB — CMP14+EGFR
ALT: 19 IU/L (ref 0–32)
AST: 22 IU/L (ref 0–40)
Albumin/Globulin Ratio: 1.6 (ref 1.2–2.2)
Albumin: 4.1 g/dL (ref 3.7–4.7)
Alkaline Phosphatase: 95 IU/L (ref 44–121)
BUN/Creatinine Ratio: 17 (ref 12–28)
BUN: 19 mg/dL (ref 8–27)
Bilirubin Total: 0.2 mg/dL (ref 0.0–1.2)
CO2: 28 mmol/L (ref 20–29)
Calcium: 10 mg/dL (ref 8.7–10.3)
Chloride: 103 mmol/L (ref 96–106)
Creatinine, Ser: 1.1 mg/dL — ABNORMAL HIGH (ref 0.57–1.00)
Globulin, Total: 2.6 g/dL (ref 1.5–4.5)
Glucose: 86 mg/dL (ref 70–99)
Potassium: 4.8 mmol/L (ref 3.5–5.2)
Sodium: 144 mmol/L (ref 134–144)
Total Protein: 6.7 g/dL (ref 6.0–8.5)
eGFR: 52 mL/min/{1.73_m2} — ABNORMAL LOW (ref >59)

## 2022-01-31 LAB — LIPID PANEL
Chol/HDL Ratio: 2.2 ratio (ref 0.0–4.4)
Cholesterol, Total: 187 mg/dL (ref 100–199)
HDL: 84 mg/dL (ref >39)
LDL Chol Calc (NIH): 87 mg/dL (ref 0–99)
Triglycerides: 93 mg/dL (ref 0–149)
VLDL Cholesterol Cal: 16 mg/dL (ref 5–40)

## 2022-01-31 LAB — CBC WITH DIFFERENTIAL/PLATELET
Basophils Absolute: 0.1 10*3/uL (ref 0.0–0.2)
Basos: 1 %
EOS (ABSOLUTE): 0.8 10*3/uL — ABNORMAL HIGH (ref 0.0–0.4)
Eos: 7 %
Hematocrit: 42.9 % (ref 34.0–46.6)
Hemoglobin: 14.4 g/dL (ref 11.1–15.9)
Immature Grans (Abs): 0 10*3/uL (ref 0.0–0.1)
Immature Granulocytes: 0 %
Lymphocytes Absolute: 3.2 10*3/uL — ABNORMAL HIGH (ref 0.7–3.1)
Lymphs: 29 %
MCH: 30.1 pg (ref 26.6–33.0)
MCHC: 33.6 g/dL (ref 31.5–35.7)
MCV: 90 fL (ref 79–97)
Monocytes Absolute: 1 10*3/uL — ABNORMAL HIGH (ref 0.1–0.9)
Monocytes: 10 %
Neutrophils Absolute: 5.7 10*3/uL (ref 1.4–7.0)
Neutrophils: 53 %
Platelets: 332 10*3/uL (ref 150–450)
RBC: 4.79 x10E6/uL (ref 3.77–5.28)
RDW: 11.9 % (ref 11.7–15.4)
WBC: 10.8 10*3/uL (ref 3.4–10.8)

## 2022-01-31 LAB — TSH: TSH: 2.74 u[IU]/mL (ref 0.450–4.500)

## 2022-01-31 NOTE — Telephone Encounter (Signed)
Pts granddaughter called to see if [place] had been scheduled yet for patient.   Wants Loma Sousa to call her or her mom before making the appt to make sure the date/time of [place] appt will work for them to bring pt.

## 2022-02-07 ENCOUNTER — Other Ambulatory Visit: Payer: Self-pay

## 2022-02-07 ENCOUNTER — Ambulatory Visit (HOSPITAL_COMMUNITY)
Admission: RE | Admit: 2022-02-07 | Discharge: 2022-02-07 | Disposition: A | Discharge status: Home / Self Care | Payer: Medicare HMO | Source: Ambulatory Visit | Attending: Family Medicine | Admitting: Family Medicine

## 2022-02-07 DIAGNOSIS — E041 Nontoxic single thyroid nodule: Secondary | ICD-10-CM | POA: Insufficient documentation

## 2022-02-08 ENCOUNTER — Other Ambulatory Visit: Payer: Self-pay | Admitting: Family Medicine

## 2022-02-08 DIAGNOSIS — E041 Nontoxic single thyroid nodule: Secondary | ICD-10-CM

## 2022-02-15 ENCOUNTER — Ambulatory Visit (HOSPITAL_COMMUNITY)
Admission: RE | Admit: 2022-02-15 | Discharge: 2022-02-15 | Disposition: A | Discharge status: Home / Self Care | Payer: Medicare HMO | Source: Ambulatory Visit | Attending: Family Medicine | Admitting: Family Medicine

## 2022-02-15 ENCOUNTER — Encounter (HOSPITAL_COMMUNITY): Payer: Self-pay

## 2022-02-15 ENCOUNTER — Other Ambulatory Visit: Payer: Self-pay

## 2022-02-15 DIAGNOSIS — E041 Nontoxic single thyroid nodule: Secondary | ICD-10-CM | POA: Diagnosis not present

## 2022-02-15 DIAGNOSIS — D34 Benign neoplasm of thyroid gland: Secondary | ICD-10-CM | POA: Insufficient documentation

## 2022-02-15 MED ORDER — LIDOCAINE HCL (PF) 2 % IJ SOLN
INTRAMUSCULAR | Status: AC
  Administered 2022-02-15: 10 mL via INTRADERMAL
  Filled 2022-02-15: qty 10

## 2022-02-15 NOTE — Progress Notes (Signed)
PT tolerated thyroid biopsy procedure well today. Labs and afirma obtained and sent for pathology. PT ambulatory at discharge to waiting area where daughter was waiting with no acute distress noted and verbalized understanding of discharge instructions.  ?

## 2022-02-16 ENCOUNTER — Ambulatory Visit (INDEPENDENT_AMBULATORY_CARE_PROVIDER_SITE_OTHER): Payer: Medicare HMO | Admitting: Licensed Clinical Social Worker

## 2022-02-16 NOTE — Patient Instructions (Addendum)
Visit Information ? ?Patient Goals:   Manage My Emotions (Patient). Needs help in managing depression and depression symptoms ? ?Timeframe:  Short-Term Goal ?Priority:  Medium ?Progress: On Track ?Start Date:    02/16/22                           ?Expected End Date:    05/17/22                 ? ?Follow Up Date   04/12/22 at 3:00 PM ?  ?Manage My Emotions (Patient): needs help in managing depression and depression symptoms   ?  ?Why is this important?   ?When you are stressed, down or upset, your body reacts too.  ?For example, your blood pressure may get higher; you may have a headache or stomachache.  ?When your emotions get the best of you, your body's ability to fight off cold and flu gets weak.  ?These steps will help you manage your emotions.   ? ?Patient Self Care Activities:  ?Self administers medications as prescribed ?Attends all scheduled provider appointments ?Performs ADL's independently ? ?Patient Coping Strengths:  ?Supportive Relationships ?Family ?Friends ?Hopefulness ? ?Patient Self Care Deficits:  ?Lacks social connections ?Transportation needs occasionally ? ?Patient Goals:  ?- avoid negative self-talk ?- spend time or talk with others every day ?- practice relaxation or meditation daily ?- keep a calendar with appointment dates ? ? Follow Up Plan:  LCSW to call client  or spouse of client on 04/12/22 at 3:00 PM to assess client needs ? ?[name].Linley Moskal MSW, LCSW ?Licensed Clinical Social Worker ?[facility] ?[phone] ?

## 2022-02-16 NOTE — Chronic Care Management (AMB) (Signed)
?Chronic Care Management  ? ? Clinical Social Work Note ? ?02/16/2022 ?Name: Carolyn Erickson MRN: [id number] DOB: 1946-05-07 ? ?Carolyn Erickson is a 76 y.o. year old female who is a primary care patient of Dettinger, Fransisca Kaufmann, MD. The CCM team was consulted to assist the patient with chronic disease management and/or care coordination needs related to: [employer] .  ? ?Engaged with patient / spouse of patient, Carolyn Erickson, by telephone for follow up visit in response to provider referral for social work chronic care management and care coordination services.  ? ?Consent to Services:  ?The patient was given information about Chronic Care Management services, agreed to services, and gave verbal consent prior to initiation of services.  Please see initial visit note for detailed documentation.  ? ?Patient agreed to services and consent obtained.  ? ?Assessment: Review of patient past medical history, allergies, medications, and health status, including review of relevant consultants reports was performed today as part of a comprehensive evaluation and provision of chronic care management and care coordination services.    ? ?SDOH (Social Determinants of Health) assessments and interventions performed:  ?SDOH Interventions   ? ?Flowsheet Row Most Recent Value  ?SDOH Interventions   ?Physical Activity Interventions Other (Comments)  [walking challenges. she uses a walker to help her walk]  ?Stress Interventions Other (Comment)  [client has stress related to managing medical needs]  ?Depression Interventions/Treatment  Medication  ? ?  ?  ? ?Advanced Directives Status: See [employer] application for related entries. ? ?CCM Care Plan ? ?No Known Allergies ? ?Outpatient Encounter Medications as of 02/16/2022  ?Medication Sig  ? amLODipine (NORVASC) 2.5 MG tablet Take 1 tablet (2.5 mg total) by mouth daily.  ? atorvastatin (LIPITOR) 20 MG tablet Take 1 tablet (20 mg total) by mouth daily.  ? buPROPion (WELLBUTRIN SR)  150 MG 12 hr tablet Take 1 tablet (150 mg total) by mouth 2 (two) times daily.  ? cholecalciferol (VITAMIN D3) 25 MCG (1000 UNIT) tablet Take 1,000 Units by mouth daily.  ? cyclobenzaprine (FLEXERIL) 10 MG tablet Take 1 tablet (10 mg total) by mouth at bedtime as needed for muscle spasms (neck pain).  ? meloxicam (MOBIC) 7.5 MG tablet TAKE 1 TABLET EVERY DAY  ? venlafaxine (EFFEXOR) 37.5 MG tablet Take 1 tablet (37.5 mg total) by mouth 2 (two) times daily.  ? ?No facility-administered encounter medications on file as of 02/16/2022.  ? ? ?Patient Active Problem List  ? Diagnosis Date Noted  ? Thyroid nodule 02/10/2021  ? Affective disorder ([place]) 11/26/2019  ? BMI 22.0-22.9, adult 11/26/2019  ? Hx of adenomatous polyp of colon 11/26/2019  ? Personal history of breast cancer 11/26/2019  ? DDD (degenerative disc disease), lumbar 06/20/2019  ? Depression, recurrent ([place]) 05/29/2019  ? Hyperlipidemia 05/29/2019  ? Essential hypertension 05/29/2019  ? Cervical radiculopathy 01/11/2018  ? ? ?Conditions to be addressed/monitored: monitor client management of depression issues ? ?Care Plan : Depression (Adult)  ?Updates made by Katha Cabal, LCSW since 02/16/2022 12:00 AM  ?  ? ?Problem: Depression Identification (Depression)   ?  ? ?Goal: Client will manage depression and depression symptoms faced over next 30 days   ?Start Date: 02/16/2022  ?Expected End Date: 05/17/2022  ?This Visit's Progress: On track  ?Recent Progress: On track  ?Priority: Medium  ?Note:   ?Current Barriers:  ?Chronic Mental Health needs related to depression and depression management ?Limited social support   ?Some walking challenges ?Suicidal Ideation/Homicidal Ideation: No ? ?  Clinical Social Work Goal(s):  ?patient will work with SW monthly by telephone or in person to reduce or manage symptoms related to depression and depression management ?patient will work with SW monthly to address concerns related to client completion of ADLs ?Patient will  communicate with RNCM or LCSW as needed for CCM support in next 30 days ? ?Interventions: ? ?1:1 collaboration with Dr. Vonna Kotyk Dettinger MD regarding development and update of comprehensive plan of care as evidenced by provider attestation and co-signature  ?Reviewed client needs with Carolyn Erickson, spouse of client. ?Reviewed appetite of client.  Carolyn Erickson said that client is starting to eat better in recent days. ?Discussed ambulation challenges for Carolyn Erickson. Carolyn Erickson said that client becomes dizzy sometimes and has trouble remaining stable when walking. He said she uses a walker to help her walk ?Spoke with Carolyn Erickson about client most recent fall. He spoke of her recent fall. He said she fell backwards and hit her head on a brick area.  He said she has headaches still.  He said she has a stiff neck and has difficulty turning her head from side to side. He affirmed that she has balance issues. ?Reviewed mood status of client. Carolyn Erickson said that client is taking medications as prescribed.  He thinks that overall her mood is stable. ?Reviewed  transport needs of client (daughter is supportive with transport needs) ?Encouraged client  or Carolyn Erickson to call RNCM as needed for nursing support for client ?Discussed sleeping issues of client. ? ?Patient Self Care Activities:  ?Self administers medications as prescribed ?Attends all scheduled provider appointments ? ?Patient Coping Strengths:  ?Family ?Friends ?Hopefulness ? ?Patient Self Care Deficits:  ?Transportation needs occasionally ?Needs help with some ADLs ?Fall risk ? ?Patient Goals:  ?- spend time or talk with others every day for next 30 day ?- practice relaxation or meditation daily for next 30 days ?- keep a calendar with appointment dates for next 30 days ? ?Follow Up Plan: LCSW to call client or spouse of client on 04/12/22 at 3:00 PM to  assess client needs  ?  ?  ?[name].Siani Utke MSW, LCSW ?Licensed Clinical Social Worker ?[facility] ?[phone] ? ?

## 2022-02-17 LAB — CYTOLOGY - NON PAP

## 2022-02-27 ENCOUNTER — Ambulatory Visit (INDEPENDENT_AMBULATORY_CARE_PROVIDER_SITE_OTHER): Payer: Medicare HMO | Admitting: Family Medicine

## 2022-02-27 ENCOUNTER — Encounter: Payer: Self-pay | Admitting: Family Medicine

## 2022-02-27 VITALS — BP 145/84 | HR 94 | Ht 61.0 in | Wt 111.0 lb

## 2022-02-27 DIAGNOSIS — R41840 Attention and concentration deficit: Secondary | ICD-10-CM

## 2022-02-27 DIAGNOSIS — R413 Other amnesia: Secondary | ICD-10-CM | POA: Diagnosis not present

## 2022-02-27 NOTE — Progress Notes (Signed)
? ?BP (!) 145/84   Pulse 94   Ht '5\' 1"'$  (1.549 m)   Wt 111 lb (50.3 kg)   SpO2 97%   BMI 20.97 kg/m?   ? ?Subjective:  ? ?Patient ID: Carolyn Erickson, female    DOB: 26-Mar-1946, 76 y.o.   MRN: [id number] ? ?HPI: ?Carolyn Erickson is a 76 y.o. female presenting on 02/27/2022 for Unable to concentrate ? ? ?HPI ?Patient is coming in today with complaints of not being able to concentrate and being more forgetful and stopping and staring.  Her husband says has been increased over the past 2 or 3 weeks.  She has had some mild memory issues.  They did say that they changed the generic of her Wellbutrin a week ago but the started before that and we had just added that Wellbutrin in December.  He is just concerned because of these changes.  She is also been sleeping a lot more.  They deny her having any urinary or breathing or respiratory or any kind of infection that they can tell. ? ?Relevant past medical, surgical, family and social history reviewed and updated as indicated. Interim medical history since our last visit reviewed. ?Allergies and medications reviewed and updated. ? ?Review of Systems  ?Constitutional:  Negative for chills and fever.  ?HENT:  Negative for congestion, ear discharge and ear pain.   ?Eyes:  Negative for redness and visual disturbance.  ?Respiratory:  Negative for chest tightness and shortness of breath.   ?Cardiovascular:  Negative for chest pain and leg swelling.  ?Genitourinary:  Negative for difficulty urinating, dysuria and urgency.  ?Musculoskeletal:  Negative for back pain and gait problem.  ?Skin:  Negative for rash.  ?Neurological:  Negative for dizziness, light-headedness and headaches.  ?Psychiatric/Behavioral:  Positive for confusion and decreased concentration. Negative for agitation, behavioral problems, dysphoric mood, self-injury, sleep disturbance and suicidal ideas. The patient is not nervous/anxious.   ?All other systems reviewed and are negative. ? ?Per HPI unless specifically  indicated above ? ? ?Allergies as of 02/27/2022   ?No Known Allergies ?  ? ?  ?Medication List  ?  ? ?  ? Accurate as of February 27, 2022 11:36 AM. If you have any questions, ask your nurse or doctor.  ?  ?  ? ?  ? ?amLODipine 2.5 MG tablet ?Commonly known as: NORVASC ?Take 1 tablet (2.5 mg total) by mouth daily. ?  ?atorvastatin 20 MG tablet ?Commonly known as: LIPITOR ?Take 1 tablet (20 mg total) by mouth daily. ?  ?buPROPion 150 MG 12 hr tablet ?Commonly known as: WELLBUTRIN SR ?Take 1 tablet (150 mg total) by mouth 2 (two) times daily. ?  ?cholecalciferol 25 MCG (1000 UNIT) tablet ?Commonly known as: VITAMIN D3 ?Take 1,000 Units by mouth daily. ?  ?cyclobenzaprine 10 MG tablet ?Commonly known as: FLEXERIL ?Take 1 tablet (10 mg total) by mouth at bedtime as needed for muscle spasms (neck pain). ?  ?meloxicam 7.5 MG tablet ?Commonly known as: MOBIC ?TAKE 1 TABLET EVERY DAY ?  ?venlafaxine 37.5 MG tablet ?Commonly known as: EFFEXOR ?Take 1 tablet (37.5 mg total) by mouth 2 (two) times daily. ?  ? ?  ? ? ? ?Objective:  ? ?BP (!) 145/84   Pulse 94   Ht '5\' 1"'$  (1.549 m)   Wt 111 lb (50.3 kg)   SpO2 97%   BMI 20.97 kg/m?   ?Wt Readings from Last 3 Encounters:  ?02/27/22 111 lb (50.3 kg)  ?01/30/22 111  lb (50.3 kg)  ?01/13/22 111 lb (50.3 kg)  ?  ?Physical Exam ?Vitals and nursing note reviewed.  ?Constitutional:   ?   General: She is not in acute distress. ?   Appearance: She is well-developed. She is not diaphoretic.  ?Eyes:  ?   Extraocular Movements: Extraocular movements intact.  ?   Conjunctiva/sclera: Conjunctivae normal.  ?   Pupils: Pupils are equal, round, and reactive to light.  ?Cardiovascular:  ?   Rate and Rhythm: Normal rate and regular rhythm.  ?   Heart sounds: Normal heart sounds. No murmur heard. ?Pulmonary:  ?   Effort: Pulmonary effort is normal. No respiratory distress.  ?   Breath sounds: Normal breath sounds. No wheezing.  ?Musculoskeletal:     ?   General: No tenderness. Normal range of motion.   ?Skin: ?   General: Skin is warm and dry.  ?   Findings: No rash.  ?Neurological:  ?   General: No focal deficit present.  ?   Mental Status: She is alert. She is disoriented.  ?   Cranial Nerves: No cranial nerve deficit.  ?   Sensory: No sensory deficit.  ?   Motor: No weakness.  ?   Coordination: Coordination normal.  ?Psychiatric:     ?   Behavior: Behavior normal.  ? ? ? ? ?Assessment & Plan:  ? ?Problem List Items Addressed This Visit   ?None ?Visit Diagnoses   ? ? Difficulty concentrating    -  Primary  ? Relevant Orders  ? Ambulatory referral to Neurology  ? Memory change      ? Relevant Orders  ? Ambulatory referral to Neurology  ? ?  ?Recommended neurology referral, also recommended that we taper the Wellbutrin, she will go down to 1 a day for a week and then come off of it and let me know if anything changes with the memory. ? ?Follow up plan: ?Return if symptoms worsen or fail to improve. ? ?Counseling provided for all of the vaccine components ?Orders Placed This Encounter  ?Procedures  ? Ambulatory referral to Neurology  ? ? ?Caryl Pina, MD ?[facility] ?02/27/2022, 11:36 AM ? ? ? ? ?

## 2022-02-27 NOTE — Patient Instructions (Addendum)
Take Wellbutrin 150 mg once a day for a week and then stop and then let me know in 2 or 3 weeks how she is doing with memory and concentration ?

## 2022-03-01 ENCOUNTER — Other Ambulatory Visit: Payer: Self-pay | Admitting: Family Medicine

## 2022-03-01 DIAGNOSIS — I10 Essential (primary) hypertension: Secondary | ICD-10-CM

## 2022-03-03 ENCOUNTER — Encounter: Payer: Self-pay | Admitting: Physician Assistant

## 2022-03-03 DIAGNOSIS — F339 Major depressive disorder, recurrent, unspecified: Secondary | ICD-10-CM

## 2022-03-03 DIAGNOSIS — E782 Mixed hyperlipidemia: Secondary | ICD-10-CM

## 2022-03-03 DIAGNOSIS — I10 Essential (primary) hypertension: Secondary | ICD-10-CM

## 2022-03-14 ENCOUNTER — Other Ambulatory Visit (INDEPENDENT_AMBULATORY_CARE_PROVIDER_SITE_OTHER): Payer: Medicare HMO

## 2022-03-14 ENCOUNTER — Ambulatory Visit: Payer: Medicare HMO | Admitting: Physician Assistant

## 2022-03-14 ENCOUNTER — Encounter: Payer: Self-pay | Admitting: Physician Assistant

## 2022-03-14 VITALS — BP 133/76 | HR 83 | Resp 18 | Ht 60.0 in | Wt 110.0 lb

## 2022-03-14 DIAGNOSIS — F0390 Unspecified dementia without behavioral disturbance: Secondary | ICD-10-CM

## 2022-03-14 DIAGNOSIS — R413 Other amnesia: Secondary | ICD-10-CM

## 2022-03-14 LAB — SEDIMENTATION RATE: Sed Rate: 59 mm/hr — ABNORMAL HIGH (ref 0–30)

## 2022-03-14 LAB — VITAMIN B12: Vitamin B-12: 385 pg/mL (ref 211–911)

## 2022-03-14 MED ORDER — DONEPEZIL HCL 10 MG PO TABS: ORAL_TABLET | ORAL | 11 refills | Status: DC

## 2022-03-14 NOTE — Progress Notes (Signed)
? ? ?Assessment/Plan:  ? ?Carolyn Erickson is a very pleasant 76 y.o. year old RH female with  a history of hypertension, hyperlipidemia, anxiety, depression seen today for evaluation of memory loss. MoCA today is 17/30, with deficiencies in visuospatial executive 2/5, naming 2/3, attention, abstraction, delayed recall 0/5, orientation 5/6.  Findings are suspicious for mild dementia of unclear etiology at this time. ? ? Recommendations:  ? ?Mild dementia of unclear etiology ? ?MRI brain with/without contrast to assess for underlying structural abnormality and assess vascular load  ?Check B12, TSH, ANA, ESR ?Start donepezil 10 mg, half tablet for 2 weeks, then increase to 1 tablet daily if tolerated.  Side effects discussed. ?Neurocognitive testing for clarity of the diagnosis, and to rule out other contributors to memory loss such as depression, anxiety, attention issues. ?Discussed safety both in and out of the home.  ?Discussed the importance of regular daily schedule to maintain brain function.  ?Continue to monitor mood with PCP.  ?Monitor Driving ?Stay active at least 30 minutes at least 3 times a week.  ?Naps should be scheduled and should be no longer than 60 minutes and should not occur after 2 PM.  ?Control cardiovascular risk factors  ?Mediterranean diet is recommended  ?Folllow up in 1 month ? ?Subjective:  ? ? ?The patient is seen in neurologic consultation at the request of Dettinger, Fransisca Kaufmann, MD for the evaluation of memory.  The patient is accompanied by her granddaughter who supplements the history. ?This is a 76 y.o. year old RH  female who has had memory issues for about 1 year, worse over the last 6 months after having "a real bad fall in the garage".  She reports being more forgetful and distracted.  She reports that her head is hurting all the time, especially around the right eye, eyebrow, towards the right temporal area, "at its worst is about 3 or 4 times a day ".  She takes Tylenol  occasionally to relieve this headache.  No visual changes are reported.  She does states that her balance is "off after the fall, and has been falling frequently "my knees give up sometimes.  She does repeat herself, denies being disoriented when walking into her room or leaving objects in unusual places.  She does not use a cane.  She denies any wandering behavior.  She never drove.  Patient lives with her husband.  She denies any mood changes, depression or irritability.  She sleeps "okay ", but "more since falling ".  She denies any vivid dreams or sleepwalking, or hallucinations.  Her granddaughter reports that she may be paranoid, but unable to elaborate to which extent.  There are no hygiene concerns, she is independent of bathing and dressing.  Occasionally she forgets to take a medication dose.  Her husband monitors them.  Her husband also is in charge of the finances.  For about 1 year, she has been experiencing decreased appetite, lost about 10 pounds during this time.  She denies trouble swallowing.  She does not drink enough water.  She does not cook.  She denies any dizziness, her right index finger is showing blue discoloCooks.  stove on or the faucet on.  She has chronic arthritis, and at times, with sleeping with her arms extended, makes.  Bilateral numbness and tingling.  She denies unilateral weakness.  Denies tremors or anosmia.  No history of seizures.  Denies urine incontinence, retention, constipation or diarrhea.  Denies sleep apnea, alcohol or tobacco.  Father died with Alzheimer's disease, she has 1 brother with dementia of unknown type. ? ? ?No Known Allergies ? ?Current Outpatient Medications  ?Medication Instructions  ? amLODipine (NORVASC) 2.5 mg, Oral, Daily  ? atorvastatin (LIPITOR) 20 mg, Oral, Daily  ? buPROPion (WELLBUTRIN SR) 150 mg, Oral, 2 times daily  ? cholecalciferol (VITAMIN D3) 1,000 Units, Oral, Daily  ? cyclobenzaprine (FLEXERIL) 10 mg, Oral, At bedtime PRN  ? donepezil  (ARICEPT) 10 MG tablet Take half tablet (5 mg) daily for 2 weeks, then increase to the full tablet at 10 mg daily  ? meloxicam (MOBIC) 7.5 MG tablet TAKE 1 TABLET EVERY DAY  ? venlafaxine (EFFEXOR) 37.5 mg, Oral, 2 times daily  ? ? ? ?VITALS:   ?Vitals:  ? 03/14/22 0950  ?BP: 133/76  ?Pulse: 83  ?Resp: 18  ?SpO2: 97%  ?Weight: 110 lb (49.9 kg)  ?Height: 5' (1.524 m)  ? ? ?  02/27/2022  ? 11:15 AM 02/16/2022  ?  7:37 PM 01/30/2022  ?  8:15 AM 01/13/2022  ?  9:40 AM 12/29/2021  ? 10:04 AM  ?Depression screen PHQ 2/9  ?Decreased Interest 1 1 2 2  0  ?Down, Depressed, Hopeless 1 1 2 2  0  ?PHQ - 2 Score 2 2 4 4  0  ?Altered sleeping 1 1 3 3    ?Tired, decreased energy 2 1 3 3    ?Change in appetite 2 1 0 0   ?Feeling bad or failure about yourself  2 1 0 0   ?Trouble concentrating 3 1 2 2    ?Moving slowly or fidgety/restless 0 1 3 3    ?Suicidal thoughts 0 0 0 0   ?PHQ-9 Score 12 8 15 15    ?Difficult doing work/chores Very difficult Somewhat difficult  Extremely dIfficult   ? ? ?PHYSICAL EXAM  ? ?HEENT:  Normocephalic, atraumatic. The mucous membranes are moist. The superficial temporal arteries are without ropiness or tenderness. ?Cardiovascular: Regular rate and rhythm. ?Lungs: Clear to auscultation bilaterally. ?Neck: There are no carotid bruits noted bilaterally. ? ?NEUROLOGICAL: ? ?  03/14/2022  ? 10:00 AM  ?Montreal Cognitive Assessment   ?Visuospatial/ Executive (0/5) 2  ?Naming (0/3) 2  ?Attention: Read list of digits (0/2) 1  ?Attention: Read list of letters (0/1) 1  ?Attention: Serial 7 subtraction starting at 100 (0/3) 3  ?Language: Repeat phrase (0/2) 2  ?Language : Fluency (0/1) 0  ?Abstraction (0/2) 0  ?Delayed Recall (0/5) 0  ?Orientation (0/6) 5  ?Total 16  ?Adjusted Score (based on education) 17  ?  ?   ? View : No data to display.  ?  ?  ?  ?  ? ?Orientation:  Alert and oriented to person, place and time. No aphasia or dysarthria but speech is slow. Fund of knowledge is appropriate. Recent memory impaired and  remote memory intact.  Attention and concentration are reduced.   Able to name objects and repeat phrases. Delayed recall  0/5  ?Cranial nerves: There is good facial symmetry. Extraocular muscles are intact and visual fields are full to confrontational testing. Speech is fluent and clear. Soft palate rises symmetrically and there is no tongue deviation. Hearing is intact to conversational tone. ?Tone: Tone is good throughout. ?Sensation: Sensation is intact to light touch and pinprick throughout. Vibration is intact at the bilateral big toe.There is no extinction with double simultaneous stimulation. There is no sensory dermatomal level identified. ?Coordination: The patient has no difficulty with RAM's or FNF bilaterally. Normal finger to nose  ?  Motor: Strength is 5/5 in the bilateral upper and lower extremities. There is no pronator drift. There are no fasciculations noted. ?DTR's: Deep tendon reflexes are 2/4 at the bilateral biceps, triceps, brachioradialis, patella and achilles.  Plantar responses are downgoing bilaterally. ?Gait and Station: The patient is able to ambulate without difficulty.The patient is able to heel toe walk without any difficulty.The patient is able to ambulate in a tandem fashion. The patient is able to stand in the Romberg position. She leans to the Left  ?  ? ? ?Thank you for allowing [place] the opportunity to participate in the care of this nice patient. Please do not hesitate to contact us for any questions or concerns.  ? ?Total time spent on today's visit was 60 minutes, including both face-to-face time and nonface-to-face time.  Time included that spent on review of records (prior notes available to me/labs/imaging if pertinent), discussing treatment and goals, answering patient's questions and coordinating care. ? ?Cc:  Dettinger, Fransisca Kaufmann, MD ? ?Carolyn Erickson ?03/15/2022 12:50 PM   ? ? ? ?

## 2022-03-14 NOTE — Patient Instructions (Addendum)
It was a pleasure to see you today at our office.  ? ?Recommendations: ? ?Neurocognitive evaluation at our office ?MRI of the brain, the radiology office will call you to arrange you appointment ?Check labs today ?Follow up in 1 month  ?We will start donepezil half tablet ('5mg'$ ) daily for 2  weeks.  If you are tolerating the medication, then after 2 weeks, we will increase the dose to a full tablet of 10 mg daily.  Side effects include diarrhea, vivid dreams, and muscle cramps.   ? ? ? ?RECOMMENDATIONS FOR ALL PATIENTS WITH MEMORY PROBLEMS: ?1. Continue to exercise (Recommend 30 minutes of walking everyday, or 3 hours every week) ?2. Increase social interactions - continue going to [place] and enjoy social gatherings with friends and family ?3. Eat healthy, avoid fried foods and eat more fruits and vegetables ?4. Maintain adequate blood pressure, blood sugar, and blood cholesterol level. Reducing the risk of stroke and cardiovascular disease also helps promoting better memory. ?5. Avoid stressful situations. Live a simple life and avoid aggravations. Organize your time and prepare for the next day in anticipation. ?6. Sleep well, avoid any interruptions of sleep and avoid any distractions in the bedroom that may interfere with adequate sleep quality ?7. Avoid sugar, avoid sweets as there is a strong link between excessive sugar intake, diabetes, and cognitive impairment ?We discussed the Mediterranean diet, which has been shown to help patients reduce the risk of progressive memory disorders and reduces cardiovascular risk. This includes eating fish, eat fruits and green leafy vegetables, nuts like almonds and hazelnuts, walnuts, and also use olive oil. Avoid fast foods and fried foods as much as possible. Avoid sweets and sugar as sugar use has been linked to worsening of memory function. ? ?There is always a concern of gradual progression of memory problems. If this is the case, then we may need to adjust level of  care according to patient needs. Support, both to the patient and caregiver, should then be put into place.  ? ? ? ? ?You have been referred for a neuropsychological evaluation (i.e., evaluation of memory and thinking abilities). Please bring someone with you to this appointment if possible, as it is helpful for the doctor to hear from both you and another adult who knows you well. Please bring eyeglasses and hearing aids if you wear them.  ?  ?The evaluation will take approximately 3 hours and has two parts: ?  ?The first part is a clinical interview with the neuropsychologist (Dr. Melvyn Novas or Dr. Nicole Kindred). During the interview, the neuropsychologist will speak with you and the individual you brought to the appointment.  ?  ?The second part of the evaluation is testing with the doctor's technician Hinton Dyer or Maudie Mercury). During the testing, the technician will ask you to remember different types of material, solve problems, and answer some questionnaires. Your family member will not be present for this portion of the evaluation. ?  ?Please note: We must reserve several hours of the neuropsychologist's time and the psychometrician's time for your evaluation appointment. As such, there is a No-Show fee of $100. If you are unable to attend any of your appointments, please contact our office as soon as possible to reschedule.  ? ? ?FALL PRECAUTIONS: Be cautious when walking. Scan the area for obstacles that may increase the risk of trips and falls. When getting up in the mornings, sit up at the edge of the bed for a few minutes before getting out of bed. Consider  elevating the bed at the head end to avoid drop of blood pressure when getting up. Walk always in a well-lit room (use night lights in the walls). Avoid area rugs or power cords from appliances in the middle of the walkways. Use a walker or a cane if necessary and consider physical therapy for balance exercise. Get your eyesight checked regularly. ? ?FINANCIAL OVERSIGHT:  Supervision, especially oversight when making financial decisions or transactions is also recommended. ? ?HOME SAFETY: Consider the safety of the kitchen when operating appliances like stoves, microwave oven, and blender. Consider having supervision and share cooking responsibilities until no longer able to participate in those. Accidents with firearms and other hazards in the house should be identified and addressed as well. ? ? ?ABILITY TO BE LEFT ALONE: If patient is unable to contact 911 operator, consider using LifeLine, or when the need is there, arrange for someone to stay with patients. Smoking is a fire hazard, consider supervision or cessation. Risk of wandering should be assessed by caregiver and if detected at any point, supervision and safe proof recommendations should be instituted. ? ?MEDICATION SUPERVISION: Inability to self-administer medication needs to be constantly addressed. Implement a mechanism to ensure safe administration of the medications. ? ? ?  ? ? ? ?Mediterranean Diet ?A Mediterranean diet refers to food and lifestyle choices that are based on the traditions of countries located on the [employer]. This way of eating has been shown to help prevent certain conditions and improve outcomes for people who have chronic diseases, like kidney disease and heart disease. ?What are tips for following this plan? ?Lifestyle  ?Cook and eat meals together with your family, when possible. ?Drink enough fluid to keep your urine clear or pale yellow. ?Be physically active every day. This includes: ?Aerobic exercise like running or swimming. ?Leisure activities like gardening, walking, or housework. ?Get 7-8 hours of sleep each night. ?If recommended by your health care provider, drink red wine in moderation. This means 1 glass a day for nonpregnant women and 2 glasses a day for men. A glass of wine equals 5 oz (150 mL). ?Reading food labels  ?Check the serving size of packaged foods. For foods  such as rice and pasta, the serving size refers to the amount of cooked product, not dry. ?Check the total fat in packaged foods. Avoid foods that have saturated fat or trans fats. ?Check the ingredients list for added sugars, such as corn syrup. ?Shopping  ?At the grocery store, buy most of your food from the areas near the walls of the store. This includes: ?Fresh fruits and vegetables (produce). ?Grains, beans, nuts, and seeds. Some of these may be available in unpackaged forms or large amounts (in bulk). ?Fresh seafood. ?Poultry and eggs. ?Low-fat dairy products. ?Buy whole ingredients instead of prepackaged foods. ?Buy fresh fruits and vegetables in-season from local farmers markets. ?Buy frozen fruits and vegetables in resealable bags. ?If you do not have access to quality fresh seafood, buy precooked frozen shrimp or canned fish, such as tuna, salmon, or sardines. ?Buy small amounts of raw or cooked vegetables, salads, or olives from the deli or salad bar at your store. ?Stock your pantry so you always have certain foods on hand, such as olive oil, canned tuna, canned tomatoes, rice, pasta, and beans. ?Cooking  ?Cook foods with extra-virgin olive oil instead of using butter or other vegetable oils. ?Have meat as a side dish, and have vegetables or grains as your main dish. This means having  meat in small portions or adding small amounts of meat to foods like pasta or stew. ?Use beans or vegetables instead of meat in common dishes like chili or lasagna. ?Experiment with different cooking methods. Try roasting or broiling vegetables instead of steaming or saut?eing them. ?Add frozen vegetables to soups, stews, pasta, or rice. ?Add nuts or seeds for added healthy fat at each meal. You can add these to yogurt, salads, or vegetable dishes. ?Marinate fish or vegetables using olive oil, lemon juice, garlic, and fresh herbs. ?Meal planning  ?Plan to eat 1 vegetarian meal one day each week. Try to work up to 2  vegetarian meals, if possible. ?Eat seafood 2 or more times a week. ?Have healthy snacks readily available, such as: ?Vegetable sticks with hummus. ?[place] yogurt. ?Fruit and nut trail mix. ?Eat balanced meals th

## 2022-03-16 LAB — ANA W/REFLEX: Anti Nuclear Antibody (ANA): NEGATIVE

## 2022-03-25 ENCOUNTER — Ambulatory Visit
Admission: RE | Admit: 2022-03-25 | Discharge: 2022-03-25 | Disposition: A | Discharge status: Home / Self Care | Payer: Medicare HMO | Source: Ambulatory Visit | Attending: Physician Assistant | Admitting: Physician Assistant

## 2022-03-25 DIAGNOSIS — R220 Localized swelling, mass and lump, head: Secondary | ICD-10-CM | POA: Diagnosis not present

## 2022-03-25 DIAGNOSIS — R413 Other amnesia: Secondary | ICD-10-CM | POA: Diagnosis not present

## 2022-03-25 DIAGNOSIS — I6782 Cerebral ischemia: Secondary | ICD-10-CM | POA: Diagnosis not present

## 2022-03-25 DIAGNOSIS — G3189 Other specified degenerative diseases of nervous system: Secondary | ICD-10-CM | POA: Diagnosis not present

## 2022-03-27 ENCOUNTER — Encounter: Payer: Self-pay | Admitting: Family Medicine

## 2022-03-27 ENCOUNTER — Ambulatory Visit (INDEPENDENT_AMBULATORY_CARE_PROVIDER_SITE_OTHER): Payer: Medicare HMO | Admitting: Family Medicine

## 2022-03-27 VITALS — BP 140/87 | HR 80 | Temp 98.0°F | Ht 61.0 in | Wt 107.4 lb

## 2022-03-27 DIAGNOSIS — K2900 Acute gastritis without bleeding: Secondary | ICD-10-CM

## 2022-03-27 MED ORDER — PANTOPRAZOLE SODIUM 40 MG PO TBEC: 40.0000 mg | DELAYED_RELEASE_TABLET | Freq: Every day | ORAL | 1 refills | Status: DC

## 2022-03-27 NOTE — Progress Notes (Signed)
? ?BP 140/87   Pulse 80   Temp 98 ?F (36.7 ?C) (Temporal)   Ht '5\' 1"'$  (1.549 m)   Wt 107 lb 6.4 oz (48.7 kg)   SpO2 98%   BMI 20.29 kg/m?   ? ?Subjective:  ? ?Patient ID: Carolyn Erickson, female    DOB: 1946/05/10, 76 y.o.   MRN: [id number] ? ?HPI: ?Carolyn Erickson is a 76 y.o. female presenting on 03/27/2022 for Abdominal Pain (X 5 days. Patient is having soreness under right breast ) and Nausea (X 5 days) ? ? ?HPI ?Patient is coming in complaining of upper abdominal pain and burning that under her ribs on that right side that is been bothering her over the past 6 days.  She says she feels nauseous as well and has decreased appetite but denies any vomiting.  She does have some loose stools but not consistently.  She denies any fevers or chills.  She has not had any changes in medicine of the way she eats but this just all of a sudden symptoms cropped up over the past 6 days.  She says it does hurt more before she eats and then she feels better when she eats and after she eats.  She also alternately has some constipation at times and alternates between diarrhea and constipation but that is been something has been going on for a while. ? ?Relevant past medical, surgical, family and social history reviewed and updated as indicated. Interim medical history since our last visit reviewed. ?Allergies and medications reviewed and updated. ? ?Review of Systems  ?Constitutional:  Negative for chills and fever.  ?Eyes:  Negative for visual disturbance.  ?Respiratory:  Negative for chest tightness and shortness of breath.   ?Cardiovascular:  Negative for chest pain and leg swelling.  ?Gastrointestinal:  Positive for abdominal pain, constipation, diarrhea and nausea. Negative for abdominal distention, blood in stool, rectal pain and vomiting.  ?Musculoskeletal:  Negative for back pain and gait problem.  ?Skin:  Negative for rash.  ?Neurological:  Negative for light-headedness and headaches.  ?Psychiatric/Behavioral:  Negative  for agitation and behavioral problems.   ?All other systems reviewed and are negative. ? ?Per HPI unless specifically indicated above ? ? ?Allergies as of 03/27/2022   ?No Known Allergies ?  ? ?  ?Medication List  ?  ? ?  ? Accurate as of March 27, 2022 11:41 AM. If you have any questions, ask your nurse or doctor.  ?  ?  ? ?  ? ?amLODipine 2.5 MG tablet ?Commonly known as: NORVASC ?Take 1 tablet (2.5 mg total) by mouth daily. ?  ?atorvastatin 20 MG tablet ?Commonly known as: LIPITOR ?Take 1 tablet (20 mg total) by mouth daily. ?  ?buPROPion 150 MG 12 hr tablet ?Commonly known as: WELLBUTRIN SR ?Take 1 tablet (150 mg total) by mouth 2 (two) times daily. ?  ?cholecalciferol 25 MCG (1000 UNIT) tablet ?Commonly known as: VITAMIN D3 ?Take 1,000 Units by mouth daily. ?  ?cyclobenzaprine 10 MG tablet ?Commonly known as: FLEXERIL ?Take 1 tablet (10 mg total) by mouth at bedtime as needed for muscle spasms (neck pain). ?  ?donepezil 10 MG tablet ?Commonly known as: ARICEPT ?Take half tablet (5 mg) daily for 2 weeks, then increase to the full tablet at 10 mg daily ?  ?meloxicam 7.5 MG tablet ?Commonly known as: MOBIC ?TAKE 1 TABLET EVERY DAY ?  ?pantoprazole 40 MG tablet ?Commonly known as: PROTONIX ?Take 1 tablet (40 mg total) by mouth  daily. ?Started by: Worthy Rancher, MD ?  ?venlafaxine 37.5 MG tablet ?Commonly known as: EFFEXOR ?Take 1 tablet (37.5 mg total) by mouth 2 (two) times daily. ?  ? ?  ? ? ? ?Objective:  ? ?BP 140/87   Pulse 80   Temp 98 ?F (36.7 ?C) (Temporal)   Ht '5\' 1"'$  (1.549 m)   Wt 107 lb 6.4 oz (48.7 kg)   SpO2 98%   BMI 20.29 kg/m?   ?Wt Readings from Last 3 Encounters:  ?03/27/22 107 lb 6.4 oz (48.7 kg)  ?03/14/22 110 lb (49.9 kg)  ?02/27/22 111 lb (50.3 kg)  ?  ?Physical Exam ?Vitals and nursing note reviewed.  ?Constitutional:   ?   General: She is not in acute distress. ?   Appearance: She is well-developed. She is not diaphoretic.  ?Eyes:  ?   Conjunctiva/sclera: Conjunctivae normal.   ?Abdominal:  ?   General: Abdomen is flat. Bowel sounds are normal. There is no distension.  ?   Tenderness: There is abdominal tenderness in the right upper quadrant and epigastric area. There is no right CVA tenderness, left CVA tenderness, guarding or rebound. Negative signs include Murphy's sign.  ?Skin: ?   General: Skin is warm and dry.  ?   Findings: No rash.  ?Neurological:  ?   Mental Status: She is alert and oriented to person, place, and time.  ?   Coordination: Coordination normal.  ?Psychiatric:     ?   Behavior: Behavior normal.  ? ? ? ? ?Assessment & Plan:  ? ?Problem List Items Addressed This Visit   ?None ?Visit Diagnoses   ? ? Acute gastritis without hemorrhage, unspecified gastritis type    -  Primary  ? Relevant Medications  ? pantoprazole (PROTONIX) 40 MG tablet  ? Other Relevant Orders  ? CBC with Differential/Platelet  ? ?  ?  ?Likely gastritis or an early ulcer.  Will treat with antacid medication and recommended to back off on the meloxicam for a little bit if she can and let me know if its not improving. ?Follow up plan: ?Return if symptoms worsen or fail to improve. ? ?Counseling provided for all of the vaccine components ?Orders Placed This Encounter  ?Procedures  ? CBC with Differential/Platelet  ? ? ?Caryl Pina, MD ?[place] ?03/27/2022, 11:41 AM ? ? ? ? ?

## 2022-03-28 LAB — CBC WITH DIFFERENTIAL/PLATELET
Basophils Absolute: 0.1 10*3/uL (ref 0.0–0.2)
Basos: 0 %
EOS (ABSOLUTE): 0.5 10*3/uL — ABNORMAL HIGH (ref 0.0–0.4)
Eos: 3 %
Hematocrit: 41 % (ref 34.0–46.6)
Hemoglobin: 13.8 g/dL (ref 11.1–15.9)
Immature Grans (Abs): 0 10*3/uL (ref 0.0–0.1)
Immature Granulocytes: 0 %
Lymphocytes Absolute: 3.5 10*3/uL — ABNORMAL HIGH (ref 0.7–3.1)
Lymphs: 23 %
MCH: 30.1 pg (ref 26.6–33.0)
MCHC: 33.7 g/dL (ref 31.5–35.7)
MCV: 89 fL (ref 79–97)
Monocytes Absolute: 1.2 10*3/uL — ABNORMAL HIGH (ref 0.1–0.9)
Monocytes: 8 %
Neutrophils Absolute: 9.5 10*3/uL — ABNORMAL HIGH (ref 1.4–7.0)
Neutrophils: 66 %
Platelets: 368 10*3/uL (ref 150–450)
RBC: 4.59 x10E6/uL (ref 3.77–5.28)
RDW: 11.7 % (ref 11.7–15.4)
WBC: 14.7 10*3/uL — ABNORMAL HIGH (ref 3.4–10.8)

## 2022-03-29 ENCOUNTER — Telehealth: Payer: Self-pay | Admitting: Family Medicine

## 2022-03-29 NOTE — Telephone Encounter (Signed)
Discussed with Patient how to take Wellbutrin '150mg'$  ?

## 2022-03-29 NOTE — Telephone Encounter (Signed)
Patient spoke with nurse about lab results this morning but would like to talk further about her WBC being elevated. Please call back.  ?

## 2022-03-29 NOTE — Telephone Encounter (Signed)
Patient needs clarification on how to take her depression medication. Please call back.  ?

## 2022-03-29 NOTE — Telephone Encounter (Signed)
Pt informed and understood. No further concerns. ?

## 2022-04-12 ENCOUNTER — Ambulatory Visit (INDEPENDENT_AMBULATORY_CARE_PROVIDER_SITE_OTHER): Payer: Medicare HMO | Admitting: Licensed Clinical Social Worker

## 2022-04-12 DIAGNOSIS — I10 Essential (primary) hypertension: Secondary | ICD-10-CM

## 2022-04-12 DIAGNOSIS — M5136 Other intervertebral disc degeneration, lumbar region: Secondary | ICD-10-CM

## 2022-04-12 DIAGNOSIS — F339 Major depressive disorder, recurrent, unspecified: Secondary | ICD-10-CM

## 2022-04-12 DIAGNOSIS — E782 Mixed hyperlipidemia: Secondary | ICD-10-CM

## 2022-04-12 NOTE — Patient Instructions (Addendum)
Visit Information ? ?Patient Goals: Protect My Health (Patient). Manage depression issues faced ? ?Timeframe:  Short-Term Goal ?Priority:  Medium ?Progress: On Track ?Start Date:             09/05/21                ?Expected End Date:          06/29/22           ? ?Follow Up Date  05/24/22 at 3:00 PM ?  ?Protect My Health (Patient) Manage depression issues faced  ?  ?Why is this important?   ?Screening tests can find diseases early when they are easier to treat.  ?Your doctor or nurse will talk with you about which tests are important for you.  ?Getting shots for common diseases like the flu and shingles will help prevent them.   ?  ?Patient Self Care Activities:  ?Self administers medications as prescribed ?Attends all scheduled provider appointments ?Performs ADL's independently ? ?Patient Coping Strengths:  ?Family ?Friends ?Hopefulness ? ?Patient Self Care Deficits:  ?Lacks social connections ?Transportation needs occasionally ? ?Patient Goals:  ?- spend time or talk with others every day for next 30 day ?- practice relaxation or meditation daily for next 30 days ?- keep a calendar with appointment dates for next 30 days ? ?Follow Up Plan: LCSW to call client or Latrice Storlie on 05/24/22 at 3:00 PM  ? ?[name].Aneeka Bowden MSW, LCSW ?Licensed Clinical Social Worker ?[facility] ?[phone] ?

## 2022-04-12 NOTE — Chronic Care Management (AMB) (Signed)
?Chronic Care Management  ? ? Clinical Social Work Note ? ?04/12/2022 ?Name: Carolyn Erickson MRN: [id number] DOB: 10-23-1946 ? ?Carolyn Erickson is a 76 y.o. year old female who is a primary care patient of Dettinger, Fransisca Kaufmann, MD. The CCM team was consulted to assist the patient with chronic disease management and/or care coordination needs related to: [employer] .  ? ?Engaged with patient / spouse of client, Carolyn Erickson, by telephone for follow up visit in response to provider referral for social work chronic care management and care coordination services.  ? ?Consent to Services:  ?The patient was given information about Chronic Care Management services, agreed to services, and gave verbal consent prior to initiation of services.  Please see initial visit note for detailed documentation.  ? ?Patient agreed to services and consent obtained.  ? ?Assessment: Review of patient past medical history, allergies, medications, and health status, including review of relevant consultants reports was performed today as part of a comprehensive evaluation and provision of chronic care management and care coordination services.    ? ?SDOH (Social Determinants of Health) assessments and interventions performed:  ?SDOH Interventions   ? ?Flowsheet Row Most Recent Value  ?SDOH Interventions   ?Physical Activity Interventions Other (Comments)  [client has walking challenges. she uses a walker to help her walk]  ?Stress Interventions Provide Counseling  [client has stress related to memory issues]  ?Depression Interventions/Treatment  Counseling  ? ?  ?  ? ?Advanced Directives Status: See [employer] application for related entries. ? ?CCM Care Plan ? ?No Known Allergies ? ?Outpatient Encounter Medications as of 04/12/2022  ?Medication Sig  ? amLODipine (NORVASC) 2.5 MG tablet Take 1 tablet (2.5 mg total) by mouth daily.  ? atorvastatin (LIPITOR) 20 MG tablet Take 1 tablet (20 mg total) by mouth daily.  ? buPROPion (WELLBUTRIN  SR) 150 MG 12 hr tablet Take 1 tablet (150 mg total) by mouth 2 (two) times daily.  ? cholecalciferol (VITAMIN D3) 25 MCG (1000 UNIT) tablet Take 1,000 Units by mouth daily.  ? cyclobenzaprine (FLEXERIL) 10 MG tablet Take 1 tablet (10 mg total) by mouth at bedtime as needed for muscle spasms (neck pain).  ? donepezil (ARICEPT) 10 MG tablet Take half tablet (5 mg) daily for 2 weeks, then increase to the full tablet at 10 mg daily  ? meloxicam (MOBIC) 7.5 MG tablet TAKE 1 TABLET EVERY DAY  ? pantoprazole (PROTONIX) 40 MG tablet Take 1 tablet (40 mg total) by mouth daily.  ? venlafaxine (EFFEXOR) 37.5 MG tablet Take 1 tablet (37.5 mg total) by mouth 2 (two) times daily.  ? ?No facility-administered encounter medications on file as of 04/12/2022.  ? ? ?Patient Active Problem List  ? Diagnosis Date Noted  ? Thyroid nodule 02/10/2021  ? Affective disorder ([place]) 11/26/2019  ? BMI 22.0-22.9, adult 11/26/2019  ? Hx of adenomatous polyp of colon 11/26/2019  ? Personal history of breast cancer 11/26/2019  ? DDD (degenerative disc disease), lumbar 06/20/2019  ? Depression, recurrent ([place]) 05/29/2019  ? Hyperlipidemia 05/29/2019  ? Essential hypertension 05/29/2019  ? Cervical radiculopathy 01/11/2018  ? ? ?Conditions to be addressed/monitored: monitor client management of depression issues ? ?LCSW Care Plan : Depression (Adult)  ?Updates made by Katha Cabal, LCSW since 04/12/2022 12:00 AM  ?  ? ?Problem: Depression Identification (Depression)   ?  ? ?Goal: Client will manage depression and depression symptoms faced over next 30 days   ?Start Date: 02/16/2022  ?Expected End Date:  06/29/2022  ?This Visit's Progress: On track  ?Recent Progress: On track  ?Priority: Medium  ?Note:   ?Current Barriers:  ?Chronic Mental Health needs related to depression and depression management ?Memory issues    ?Some walking challenges ?Suicidal Ideation/Homicidal Ideation: No ? ?Clinical Social Work Goal(s):  ?patient will work with SW monthly  by telephone or in person to reduce or manage symptoms related to depression and depression management ?patient will work with SW monthly to address concerns related to client completion of ADLs ?Patient will communicate with RNCM or LCSW as needed for CCM support in next 30 days ? ?Interventions: ? ?1:1 collaboration with Dr. Vonna Kotyk Dettinger MD regarding development and update of comprehensive plan of care as evidenced by provider attestation and co-signature  ?Reviewed client needs with Gailen Shelter, spouse of client. ?Reviewed appetite of client.  Carolyn Erickson said that client is starting to eat better in recent days. ?Discussed ambulation challenges for [place]. Carolyn Erickson said that client uses a walker to help her walk ?Encouraged client or Carolyn Erickson to communicate as needed in next 30 days with RNCM to discuss nursing needs of client. ?Discussed mood status of client. Carolyn Erickson said that client is taking medications as prescribed.  He thinks that overall her mood is stable. He said she does get irritable occasionally related to memory issues ?Reviewed  transport needs of client (daughter is supportive with transport needs) ?Discussed sleeping issues of client. ?Discussed client visit with NP at Neurologist's office. Carolyn Erickson said that client recently had MRI ? ?Patient Self Care Activities:  ?Self administers medications as prescribed ?Attends all scheduled provider appointments ? ?Patient Coping Strengths:  ?Family ?Friends ?Hopefulness ? ?Patient Self Care Deficits:  ?Transportation needs occasionally ?Needs help with some ADLs ?Fall risk ? ?Patient Goals:  ?- spend time or talk with others every day for next 30 day ?- practice relaxation or meditation daily for next 30 days ?- keep a calendar with appointment dates for next 30 days ? ?Follow Up Plan: LCSW to call client or spouse of client on 05/24/22 at 3:00 PM   ?  ?  ?[name].Myya Meenach MSW, LCSW ?Licensed Clinical Social Worker ?[facility] ?[phone] ?

## 2022-04-13 ENCOUNTER — Ambulatory Visit: Payer: Medicare HMO | Admitting: Physician Assistant

## 2022-04-13 ENCOUNTER — Encounter: Payer: Self-pay | Admitting: Physician Assistant

## 2022-04-13 VITALS — BP 134/80 | HR 63 | Resp 18

## 2022-04-13 DIAGNOSIS — F0390 Unspecified dementia without behavioral disturbance: Secondary | ICD-10-CM

## 2022-04-13 MED ORDER — DONEPEZIL HCL 10 MG PO TABS: ORAL_TABLET | ORAL | 11 refills | Status: AC

## 2022-04-13 NOTE — Patient Instructions (Addendum)
It was a pleasure to see you today at our office.  ? ?Recommendations: ? ?Follow up in  6 months ?Keep the appt for the testing in January  ?Continue donepezil 10 mg daily. Side effects were discussed  ?Take care of the cardiovascular risk factors ? ?Discussed safety both in and out of the home.  ?Discussed the importance of regular daily schedule with inclusion of crossword puzzles to maintain brain function.  ?Continue to monitor mood by PCP ?Stay active at least 30 minutes at least 3 times a week.  ?Naps should be scheduled and should be no longer than 60 minutes and should not occur after 2 PM.  ?Mediterranean diet is recommended  ? ?  ? ?Whom to call: ? ?Memory  decline, memory medications: Call out office [phone]  ? ?For psychiatric meds, mood meds: Please have your primary care physician manage these medications.  ? ?Counseling regarding caregiver distress, including caregiver depression, anxiety and issues regarding community resources, adult day care programs, adult living facilities, or memory care questions:   Feel free to contact [place], Social Worker at [phone] ?  ?For assessment of decision of mental capacity and competency:  Call Dr. Anthoney Harada, geriatric psychiatrist at [phone] ? ?For guidance in geriatric dementia issues please call [facility] [phone] ? ?For guidance regarding [facility] and if placement were needed at the facility, contact Arnell Asal, Social Worker tel: [phone] ? ?If you have any severe symptoms of a stroke, or other severe issues such as confusion,severe chills or fever, etc call 911 or go to the ER as you may need to be evaluate further ? ? ?Feel free to visit Facebook page " [email or web address]" for tips of how to care for people with memory problems.  ? ?  ? ? ?RECOMMENDATIONS FOR ALL PATIENTS WITH MEMORY PROBLEMS: ?1. Continue to exercise (Recommend 30 minutes of walking everyday, or 3 hours every week) ?2.  Increase social interactions - continue going to [place] and enjoy social gatherings with friends and family ?3. Eat healthy, avoid fried foods and eat more fruits and vegetables ?4. Maintain adequate blood pressure, blood sugar, and blood cholesterol level. Reducing the risk of stroke and cardiovascular disease also helps promoting better memory. ?5. Avoid stressful situations. Live a simple life and avoid aggravations. Organize your time and prepare for the next day in anticipation. ?6. Sleep well, avoid any interruptions of sleep and avoid any distractions in the bedroom that may interfere with adequate sleep quality ?7. Avoid sugar, avoid sweets as there is a strong link between excessive sugar intake, diabetes, and cognitive impairment ?We discussed the Mediterranean diet, which has been shown to help patients reduce the risk of progressive memory disorders and reduces cardiovascular risk. This includes eating fish, eat fruits and green leafy vegetables, nuts like almonds and hazelnuts, walnuts, and also use olive oil. Avoid fast foods and fried foods as much as possible. Avoid sweets and sugar as sugar use has been linked to worsening of memory function. ? ?There is always a concern of gradual progression of memory problems. If this is the case, then we may need to adjust level of care according to patient needs. Support, both to the patient and caregiver, should then be put into place.  ? ? ?The Alzheimer?s Association is here all day, every day for people facing Alzheimer?s disease through our free 24/7 Helpline: [phone]. The Helpline provides reliable information and support to all those who need assistance, such as  individuals living with memory loss, Alzheimer's or other dementia, caregivers, health care professionals and the public.  ?Our highly trained and knowledgeable staff can help you with: ?Understanding memory loss, dementia and Alzheimer's  ?Medications and other treatment options  ?General  information about aging and brain health  ?Skills to provide quality care and to find the best care from professionals  ?Legal, financial and living-arrangement decisions ?Our Helpline also features: ?Confidential care consultation provided by master's level clinicians who can help with decision-making support, crisis assistance and education on issues families face every day  ?Help in a caller's preferred language using our translation service that features more than 200 languages and dialects  ?Referrals to local community programs, services and ongoing support ? ? ? ? ?FALL PRECAUTIONS: Be cautious when walking. Scan the area for obstacles that may increase the risk of trips and falls. When getting up in the mornings, sit up at the edge of the bed for a few minutes before getting out of bed. Consider elevating the bed at the head end to avoid drop of blood pressure when getting up. Walk always in a well-lit room (use night lights in the walls). Avoid area rugs or power cords from appliances in the middle of the walkways. Use a walker or a cane if necessary and consider physical therapy for balance exercise. Get your eyesight checked regularly. ? ?FINANCIAL OVERSIGHT: Supervision, especially oversight when making financial decisions or transactions is also recommended. ? ?HOME SAFETY: Consider the safety of the kitchen when operating appliances like stoves, microwave oven, and blender. Consider having supervision and share cooking responsibilities until no longer able to participate in those. Accidents with firearms and other hazards in the house should be identified and addressed as well. ? ? ?ABILITY TO BE LEFT ALONE: If patient is unable to contact 911 operator, consider using [facility], or when the need is there, arrange for someone to stay with patients. Smoking is a fire hazard, consider supervision or cessation. Risk of wandering should be assessed by caregiver and if detected at any point, supervision and  safe proof recommendations should be instituted. ? ?MEDICATION SUPERVISION: Inability to self-administer medication needs to be constantly addressed. Implement a mechanism to ensure safe administration of the medications. ? ? ?DRIVING: Regarding driving, in patients with progressive memory problems, driving will be impaired. We advise to have someone else do the driving if trouble finding directions or if minor accidents are reported. Independent driving assessment is available to determine safety of driving. ? ? ?If you are interested in the driving assessment, you can contact the following: ? ?[facility] [employer] [facility] in [place] ? ?[place] [phone] ? ?[facility] [phone] ? ?[facility] [phone] or [phone] ? ?  ? ? ?Mediterranean Diet ?A Mediterranean diet refers to food and lifestyle choices that are based on the traditions of countries located on the [employer]. This way of eating has been shown to help prevent certain conditions and improve outcomes for people who have chronic diseases, like kidney disease and heart disease. ?What are tips for following this plan? ?Lifestyle  ?Cook and eat meals together with your family, when possible. ?Drink enough fluid to keep your urine clear or pale yellow. ?Be physically active every day. This includes: ?Aerobic exercise like running or swimming. ?Leisure activities like gardening, walking, or housework. ?Get 7-8 hours of sleep each night. ?If recommended by your health care provider, drink red wine in moderation. This means 1 glass a day for nonpregnant  women and 2 glasses a day for men. A glass of wine equals 5 oz (150 mL). ?Reading food labels  ?Check the serving size of packaged foods. For foods such as rice and pasta, the serving size refers to the amount of cooked product, not dry. ?Check the total fat in packaged foods. Avoid foods that have saturated fat or trans fats. ?Check the  ingredients list for added sugars, such as corn syrup. ?Shopping  ?At the grocery store, buy most of your food from the areas near the walls of the store. This includes: ?Fresh fruits and vegetables (produce).

## 2022-04-13 NOTE — Progress Notes (Signed)
? ?Assessment/Plan:  ? 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 ? ?Carolyn Erickson is a very pleasant 76 y.o. RH female  with a history of depression, hypertension, hyperlipidemia, and dementia likely due to vascular disease  seen today in follow up for memory loss.  She is on Donepezil 10 mg daily, tolerating well. MRI of the brain reviewed by me was remarkable for progressed moderate chronic small vessel disease within the white matter and mild cerebral atrophy, as well as known probable schwannoma ( asymptomatic for worsening hearing loss , vertigo or tinnitus).  Last MoCa on 03/14/22 was 17/30.  ? ? Recommendations:  ?Continue donepezil 10 mg   Side effects were discussed ?Keep appointment for Neuropsych evaluation on 12/2022 for clarity of diagnosis and disease trajectory ?Follow up in 6  months. ? ? ?Case discussed with Dr. Delice Lesch who agrees with the plan ? ? ? ? ?Subjective:  ? ? This patient is accompanied in the office by her who supplements the history.  Previous records as well as any outside records available were reviewed by me prior to todays visit.  Patient was last seen at our office on 03/14/22 at which time her MoCA was 17/30. She is on Donepezil 10 g daily, tolerating well ? ?Any changes in memory since last visit?. Patient denies but husband reports that she has a harder time keeping up with appointments, "she didn't know what she was coming here for"-he says ?Patient lives with: Spouse  ?repeats oneself? Denies  ?Disoriented when walking into a room?  Patient denies   ?Leaving objects in unusual places?  Patient denies   ?Ambulates  with difficulty?   "Sometimes she leans to the left"-husband reports ?Recent falls?  Patient denies   ?Any head injuries?  Patient denies   ?History of seizures?   Patient denies   ?Wandering behavior?  Patient denies   ?Patient drives?   Patient no longer drives    ?Any mood changes such irritability agitation?  Patient denies   ?Any worsening  depression?:  Patient denies   ?Hallucinations?  Patient denies   ?Paranoia?  Patient denies   ?Patient reports that he sleeps well without vivid dreams, REM behavior or sleepwalking  ?Any hygiene concerns?  Patient denies   ?Independent of bathing and dressing?  Endorsed  ?Does the patient needs help with medications? Husband monitors ?Who is in charge of the finances?  Husband in charge  ?Any changes in appetite?SHe has decreased appetite, no significant weight loss however. She takes Ensure daily ?Patient have trouble swallowing? Patient denies   ?Does the patient cook?  Patient denies   ?Any kitchen accidents such as leaving the stove on? Patient denies   ?Any headaches?  Patient denies   ?The double vision? Patient denies   ?Any focal numbness or tingling?  Patient denies   ?Chronic back pain Patient denies   ?Unilateral weakness?  Patient denies   ?Any tremors?  Patient reports intermittent tremors but denies today ?Any history of anosmia?  Patient denies   ?Any incontinence of urine?  Patient reports incontinence, wears a diaper "for a long time" ?Any bowel dysfunction?   Patient reports intermittent constipation and diarrhea ? ? Initial Visit 4/11/23The patient is seen in neurologic consultation at the request of Detting[place], Fransisca Kaufmann, MD for the evaluation of memory.  The patient is accompanied by her granddaughter who supplements the history. ?This is a 76 y.o. year old RH  female who has had memory issues  for about 1 year, worse over the last 6 months after having "a real bad fall in the garage".  She reports being more forgetful and distracted.  She reports that her head is hurting all the time, especially around the right eye, eyebrow, towards the right temporal area, "at its worst is about 3 or 4 times a day ".  She takes Tylenol occasionally to relieve this headache.  No visual changes are reported.  She does states that her balance is "off after the fall, and has been falling frequently "my knees give  up sometimes.  She does repeat herself, denies being disoriented when walking into her room or leaving objects in unusual places.  She does not use a cane.  She denies any wandering behavior.  She never drove.  Patient lives with her husband.  She denies any mood changes, depression or irritability.  She sleeps "okay ", but "more since falling ".  She denies any vivid dreams or sleepwalking, or hallucinations.  Her granddaughter reports that she may be paranoid, but unable to elaborate to which extent.  There are no hygiene concerns, she is independent of bathing and dressing.  Occasionally she forgets to take a medication dose.  Her husband monitors them.  Her husband also is in charge of the finances.  For about 1 year, she has been experiencing decreased appetite, lost about 10 pounds during this time.  She denies trouble swallowing.  She does not drink enough water.  She does not cook.  She denies any dizziness, her right index finger is showing blue discoloration. She cooks. Denies stove on or the faucet on.  She has chronic arthritis, and at times, with sleeping with her arms extended, makes.  She has a history of bilateral numbness and tingling.  She denies unilateral weakness.  Denies tremors or anosmia.  No history of seizures.  Denies urine incontinence, retention, constipation or diarrhea.  Denies sleep apnea, alcohol or tobacco.  Father died with Alzheimer's disease, she has 1 brother with dementia of unknown type. ? ?Pertinent labs: B12 385, TSH 2.74 ? ?MRI brain 03/25/22 : No evidence of acute intracranial abnormality. A known right internal auditory canal mass (probable vestibular schwannoma) is incompletely reassessed on this non-contrast examination. This mass was more fully characterized on the prior contrast-enhanced brain MRI of 02/07/2010. Moderate to moderately advanced chronic small vessel ischemic  changes within the cerebral white matter, progressed from the prior ?brain MRI. Moderate chronic  small vessel ischemic changes are also present within the pons. ? Mild generalized parenchymal atrophy. Minimal mucosal thickening within the left maxillary sinus.Trace fluid within the bilateral mastoid air cells.Incompletely assessed cervical spondylosis. ? ?PREVIOUS MEDICATIONS:  ? ?CURRENT MEDICATIONS:  ?Outpatient Encounter Medications as of 04/13/2022  ?Medication Sig  ? amLODipine (NORVASC) 2.5 MG tablet Take 1 tablet (2.5 mg total) by mouth daily.  ? atorvastatin (LIPITOR) 20 MG tablet Take 1 tablet (20 mg total) by mouth daily.  ? buPROPion (WELLBUTRIN SR) 150 MG 12 hr tablet Take 1 tablet (150 mg total) by mouth 2 (two) times daily.  ? cholecalciferol (VITAMIN D3) 25 MCG (1000 UNIT) tablet Take 1,000 Units by mouth daily.  ? cyclobenzaprine (FLEXERIL) 10 MG tablet Take 1 tablet (10 mg total) by mouth at bedtime as needed for muscle spasms (neck pain).  ? donepezil (ARICEPT) 10 MG tablet Take one tablet 10 mg daily  ? meloxicam (MOBIC) 7.5 MG tablet TAKE 1 TABLET EVERY DAY  ? pantoprazole (PROTONIX) 40 MG tablet Take  1 tablet (40 mg total) by mouth daily.  ? venlafaxine (EFFEXOR) 37.5 MG tablet Take 1 tablet (37.5 mg total) by mouth 2 (two) times daily.  ? [DISCONTINUED] donepezil (ARICEPT) 10 MG tablet Take half tablet (5 mg) daily for 2 weeks, then increase to the full tablet at 10 mg daily  ? ?No facility-administered encounter medications on file as of 04/13/2022.  ? ? ?   ? View : No data to display.  ?  ?  ?  ? ? ?  03/14/2022  ? 10:00 AM  ?Montreal Cognitive Assessment   ?Visuospatial/ Executive (0/5) 2  ?Naming (0/3) 2  ?Attention: Read list of digits (0/2) 1  ?Attention: Read list of letters (0/1) 1  ?Attention: Serial 7 subtraction starting at 100 (0/3) 3  ?Language: Repeat phrase (0/2) 2  ?Language : Fluency (0/1) 0  ?Abstraction (0/2) 0  ?Delayed Recall (0/5) 0  ?Orientation (0/6) 5  ?Total 16  ?Adjusted Score (based on education) 17  ? ? ?Objective:  ?  ? ?PHYSICAL EXAMINATION:   ? ?VITALS:    ?Vitals:  ? 04/13/22 0942  ?BP: 134/80  ?Pulse: 63  ?Resp: 18  ?SpO2: 99%  ? ? ?GEN:  The patient appears stated age and is in NAD. ?HEENT:  Normocephalic, atraumatic.  ? ?Neurological examination: ? ?G

## 2022-04-24 DIAGNOSIS — H906 Mixed conductive and sensorineural hearing loss, bilateral: Secondary | ICD-10-CM | POA: Diagnosis not present

## 2022-05-03 DIAGNOSIS — F339 Major depressive disorder, recurrent, unspecified: Secondary | ICD-10-CM

## 2022-05-03 DIAGNOSIS — E782 Mixed hyperlipidemia: Secondary | ICD-10-CM

## 2022-05-03 DIAGNOSIS — I10 Essential (primary) hypertension: Secondary | ICD-10-CM

## 2022-05-12 IMAGING — DX DG LUMBAR SPINE 2-3V
2 series · 2 of 2 positions shown · non-contrast
Comparison: None
COMPARISON: None

Addendum:
CLINICAL DATA: Lumbar pain, history of disc surgery in the lumbar
region in the past.

EXAM:
LUMBAR SPINE - 2-3 VIEW

[l-spine ap]
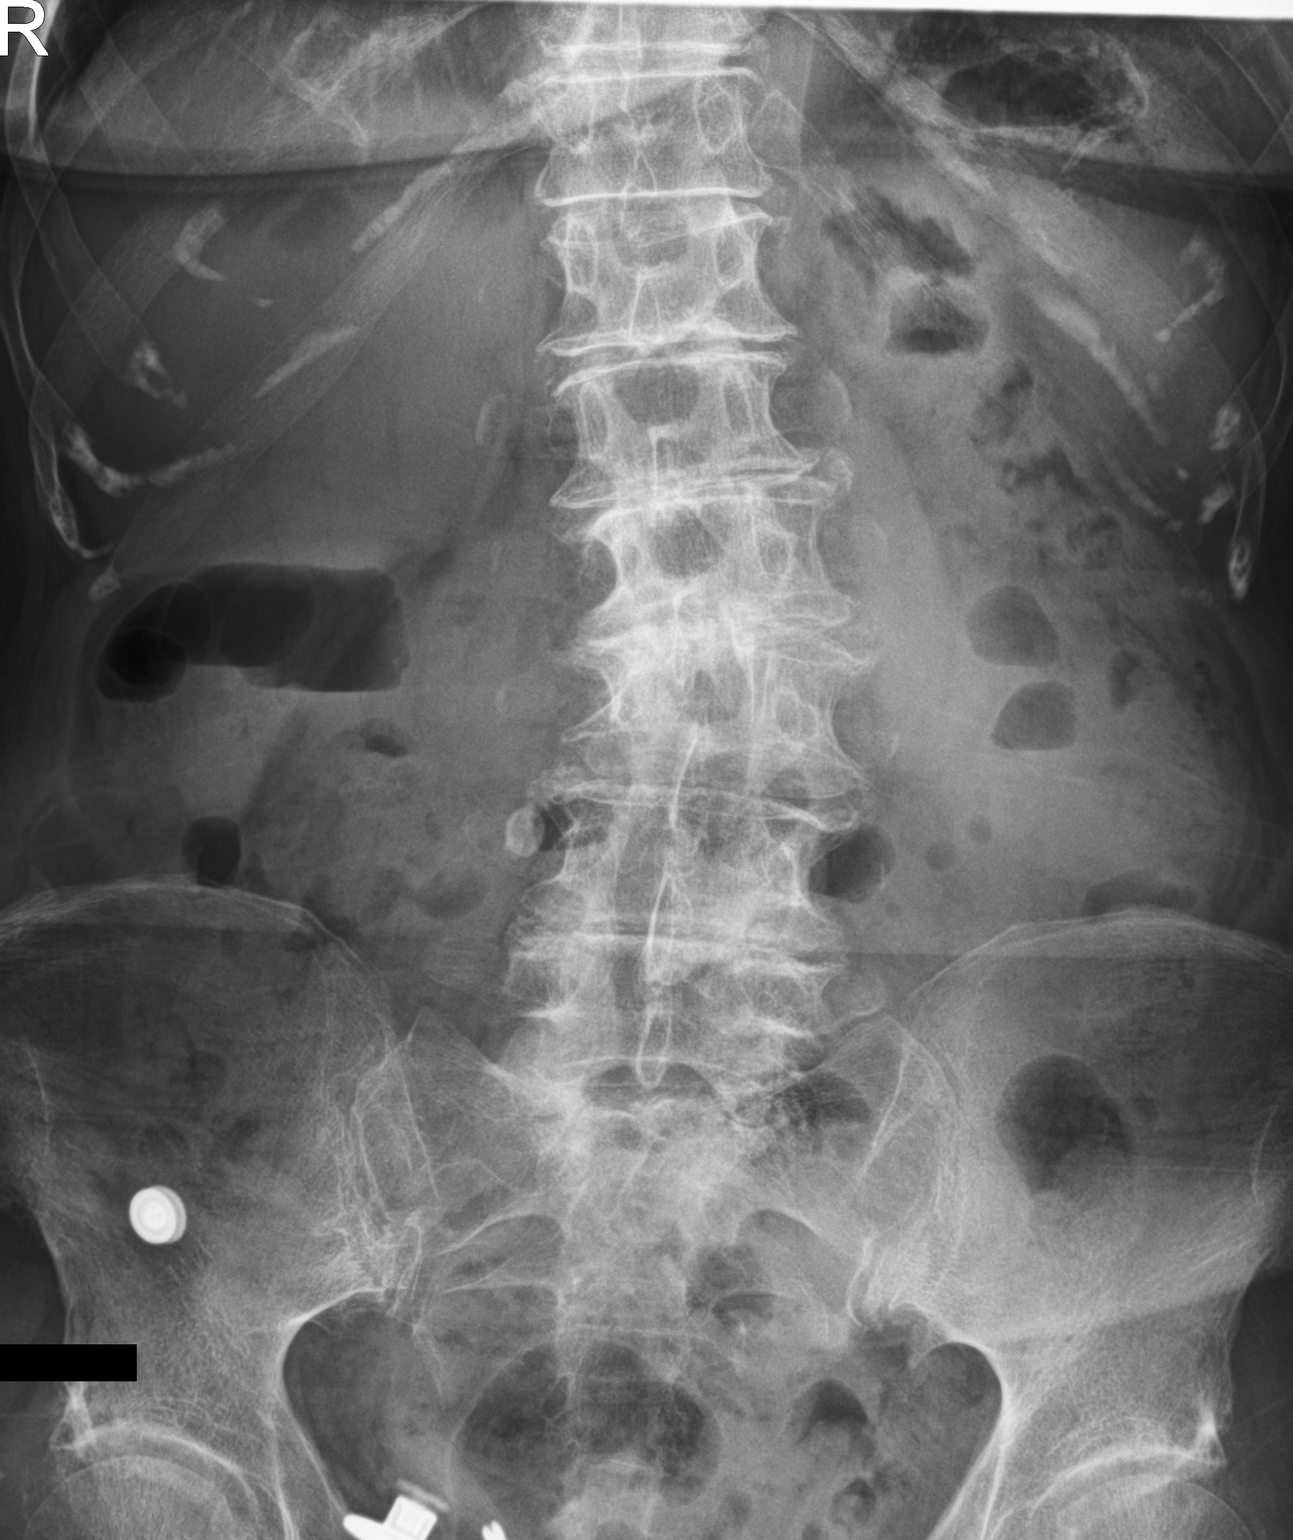

[l-spine lat]
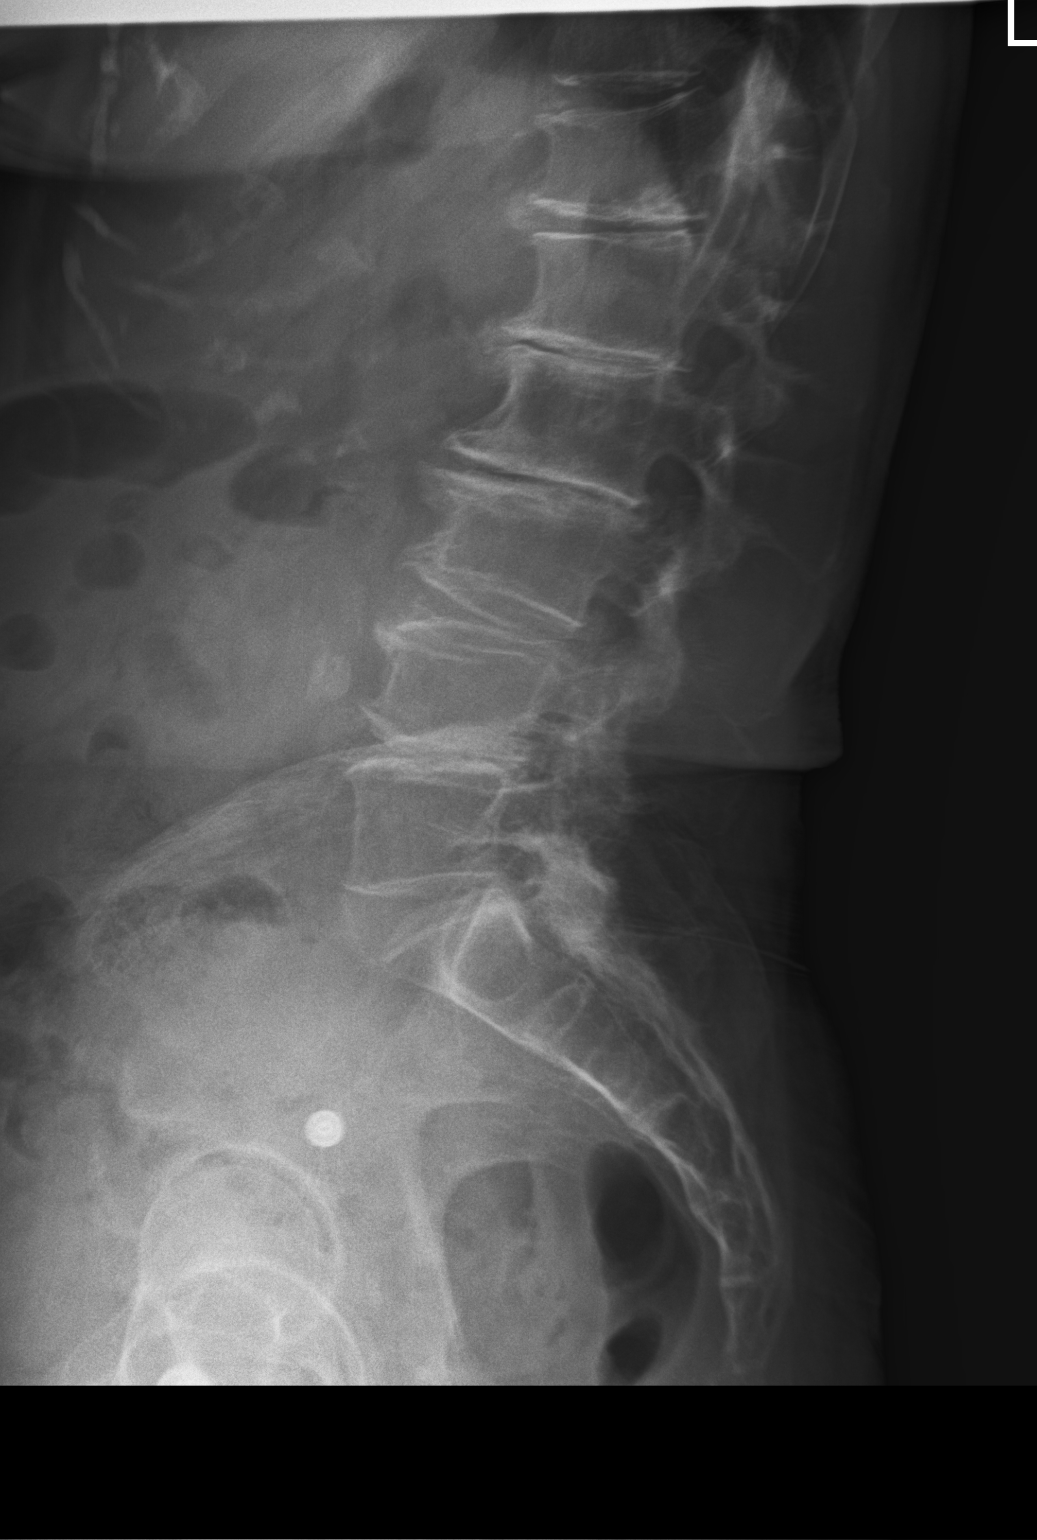

[2 of 2 positions shown; findings below may reference images not displayed]

FINDINGS: Mild levo convexity in the lumbar spine centered about the mid
lumbar spine and associated with marked degenerative changes
greatest at the "L3-L4" level. Six lumbar type vertebral bodies.

Vertebral body heights are maintained aside from anterior wedging of
L1.

Multilevel disc space narrowing greatest at L2-3, L3-4 and at the
5th-0th lumbar level.

Facet arthropathy at the lumbosacral junction.

Osteopenia.

Calcification projects lateral to the fifth lumbar level measuring 9
mm. No abdominal imaging or prior plain film imaging for comparison.
IMPRESSION: Transitional anatomy with 6 lumbar type vertebral bodies.

Moderate-to-marked multilevel degenerative change greatest in the
mid lumbar spine.

Calcification to the RIGHT of the fifth lumbar level more likely a
phlebolith based on appearance though would correlate with any signs
of RIGHT flank pain to exclude the possibility of ureteral calculus.
In the setting of RIGHT flank pain could consider CT assessment for
further evaluation as warranted.

ADDENDUM:
Mild anterior wedging of the first lumbar vertebral body is noted as
outlined in the body of the report. This may be chronic though is
strictly speaking age indeterminate on the current study. Correlate
with any symptoms in this location.

*** End of Addendum ***
FINDINGS: Mild levo convexity in the lumbar spine centered about the mid
lumbar spine and associated with marked degenerative changes
greatest at the "L3-L4" level. Six lumbar type vertebral bodies.

Vertebral body heights are maintained aside from anterior wedging of
L1.

Multilevel disc space narrowing greatest at L2-3, L3-4 and at the
5th-0th lumbar level.

Facet arthropathy at the lumbosacral junction.

Osteopenia.

Calcification projects lateral to the fifth lumbar level measuring 9
mm. No abdominal imaging or prior plain film imaging for comparison.
IMPRESSION: Transitional anatomy with 6 lumbar type vertebral bodies.

Moderate-to-marked multilevel degenerative change greatest in the
mid lumbar spine.

Calcification to the RIGHT of the fifth lumbar level more likely a
phlebolith based on appearance though would correlate with any signs
of RIGHT flank pain to exclude the possibility of ureteral calculus.
In the setting of RIGHT flank pain could consider CT assessment for
further evaluation as warranted.

## 2022-05-22 ENCOUNTER — Other Ambulatory Visit: Payer: Self-pay | Admitting: Family Medicine

## 2022-05-22 DIAGNOSIS — E782 Mixed hyperlipidemia: Secondary | ICD-10-CM

## 2022-05-22 DIAGNOSIS — F339 Major depressive disorder, recurrent, unspecified: Secondary | ICD-10-CM

## 2022-05-24 ENCOUNTER — Telehealth: Payer: Medicare HMO

## 2022-05-24 ENCOUNTER — Other Ambulatory Visit: Payer: Self-pay | Admitting: Family Medicine

## 2022-05-24 DIAGNOSIS — I10 Essential (primary) hypertension: Secondary | ICD-10-CM

## 2022-05-25 ENCOUNTER — Ambulatory Visit (INDEPENDENT_AMBULATORY_CARE_PROVIDER_SITE_OTHER): Payer: Medicare HMO | Admitting: Family Medicine

## 2022-05-25 ENCOUNTER — Ambulatory Visit: Payer: Medicare HMO | Admitting: Family Medicine

## 2022-05-25 ENCOUNTER — Encounter: Payer: Self-pay | Admitting: Family Medicine

## 2022-05-25 DIAGNOSIS — R32 Unspecified urinary incontinence: Secondary | ICD-10-CM

## 2022-05-25 NOTE — Progress Notes (Signed)
Virtual Visit via telephone Note  I connected with Carolyn Erickson on 05/25/22 at 1656 by telephone and verified that I am speaking with the correct person using two identifiers. Carolyn Erickson is currently located at home and patient are currently with her during visit. The provider, Fransisca Kaufmann Marylu Dudenhoeffer, MD is located in their office at time of visit.  Call ended at 1701  I discussed the limitations, risks, security and privacy concerns of performing an evaluation and management service by telephone and the availability of in person appointments. I also discussed with the patient that there may be a patient responsible charge related to this service. The patient expressed understanding and agreed to proceed.   History and Present Illness: Patient is calling in for incontinence and has had botox in the bladder in the past.  She is having worsening symptom and urinary issues and would like to go back to a urologist. She uses pads all day and briefs at night.  She is just fed up with it.  She needs some help with it because now it is just constant urinary incontinence and constant flow..   1. Urinary incontinence, unspecified type     Outpatient Encounter Medications as of 05/25/2022  Medication Sig   amLODipine (NORVASC) 2.5 MG tablet TAKE 1 TABLET EVERY DAY   atorvastatin (LIPITOR) 20 MG tablet Take 1 tablet (20 mg total) by mouth daily.   buPROPion (WELLBUTRIN SR) 150 MG 12 hr tablet Take 1 tablet (150 mg total) by mouth 2 (two) times daily.   cholecalciferol (VITAMIN D3) 25 MCG (1000 UNIT) tablet Take 1,000 Units by mouth daily.   cyclobenzaprine (FLEXERIL) 10 MG tablet Take 1 tablet (10 mg total) by mouth at bedtime as needed for muscle spasms (neck pain).   donepezil (ARICEPT) 10 MG tablet Take one tablet 10 mg daily   meloxicam (MOBIC) 7.5 MG tablet TAKE 1 TABLET EVERY DAY   pantoprazole (PROTONIX) 40 MG tablet Take 1 tablet (40 mg total) by mouth daily.   venlafaxine (EFFEXOR) 37.5  MG tablet Take 1 tablet (37.5 mg total) by mouth 2 (two) times daily.   No facility-administered encounter medications on file as of 05/25/2022.    Review of Systems  Constitutional:  Negative for chills and fever.  Eyes:  Negative for visual disturbance.  Respiratory:  Negative for chest tightness and shortness of breath.   Cardiovascular:  Negative for chest pain and leg swelling.  Genitourinary:  Positive for frequency and urgency. Negative for decreased urine volume, difficulty urinating and dysuria.  Musculoskeletal:  Negative for back pain and gait problem.  Skin:  Negative for rash.  Neurological:  Negative for light-headedness and headaches.  Psychiatric/Behavioral:  Negative for agitation and behavioral problems.   All other systems reviewed and are negative.   Observations/Objective: Patient sounds comfortable in no acute distress  Assessment and Plan: Problem List Items Addressed This Visit   None Visit Diagnoses     Urinary incontinence, unspecified type    -  Primary   Relevant Orders   Ambulatory referral to Urology     We will do referral to urology and see what they can help her with.  Follow up plan: Return if symptoms worsen or fail to improve.     I discussed the assessment and treatment plan with the patient. The patient was provided an opportunity to ask questions and all were answered. The patient agreed with the plan and demonstrated an understanding of the instructions.  The patient was advised to call back or seek an in-person evaluation if the symptoms worsen or if the condition fails to improve as anticipated.  The above assessment and management plan was discussed with the patient. The patient verbalized understanding of and has agreed to the management plan. Patient is aware to call the clinic if symptoms persist or worsen. Patient is aware when to return to the clinic for a follow-up visit. Patient educated on when it is appropriate to go to the  emergency department.    I provided 5 minutes of non-face-to-face time during this encounter.    Worthy Rancher, MD

## 2022-06-02 ENCOUNTER — Telehealth: Payer: Self-pay | Admitting: Family Medicine

## 2022-06-02 ENCOUNTER — Encounter: Payer: Self-pay | Admitting: Family Medicine

## 2022-06-02 ENCOUNTER — Ambulatory Visit (INDEPENDENT_AMBULATORY_CARE_PROVIDER_SITE_OTHER): Payer: Medicare HMO | Admitting: Family Medicine

## 2022-06-02 DIAGNOSIS — K2900 Acute gastritis without bleeding: Secondary | ICD-10-CM

## 2022-06-02 MED ORDER — PANTOPRAZOLE SODIUM 40 MG PO TBEC: 40.0000 mg | DELAYED_RELEASE_TABLET | Freq: Two times a day (BID) | ORAL | 3 refills | Status: AC

## 2022-06-02 NOTE — Progress Notes (Signed)
BP 118/70   Pulse 87   Temp 97.8 F (36.6 C)   Ht '5\' 1"'$  (1.549 m)   SpO2 96%   BMI 20.29 kg/m    Subjective:   Patient ID: Carolyn Erickson, female    DOB: June 17, 1946, 76 y.o.   MRN: [id number]  HPI: Carolyn Erickson is a 76 y.o. female presenting on 06/02/2022 for Weakness and Abdominal Pain   HPI Abdominal pain and nausea Patient is still having continued abdominal pain and nausea.  She said it got a little better when she was on the Protonix but that she is ran over the past week and its increased again and is just not fully improving.  She still has the indigestion and reflux and acid coming back up into her throat as well.  She says she still has upper abdominal pain and does not seem to be fully improved.  Relevant past medical, surgical, family and social history reviewed and updated as indicated. Interim medical history since our last visit reviewed. Allergies and medications reviewed and updated.  Review of Systems  Constitutional:  Negative for chills and fever.  Eyes:  Negative for visual disturbance.  Respiratory:  Negative for chest tightness and shortness of breath.   Cardiovascular:  Negative for chest pain and leg swelling.  Gastrointestinal:  Positive for abdominal pain and nausea. Negative for abdominal distention, anal bleeding, diarrhea and vomiting.  Genitourinary:  Positive for frequency.  Musculoskeletal:  Negative for back pain and gait problem.  Skin:  Negative for rash.  Neurological:  Negative for light-headedness and headaches.  Psychiatric/Behavioral:  Negative for agitation and behavioral problems.   All other systems reviewed and are negative.   Per HPI unless specifically indicated above   Allergies as of 06/02/2022   No Known Allergies      Medication List        Accurate as of June 02, 2022  3:00 PM. If you have any questions, ask your nurse or doctor.          amLODipine 2.5 MG tablet Commonly known as: NORVASC TAKE 1 TABLET  EVERY DAY   atorvastatin 20 MG tablet Commonly known as: LIPITOR Take 1 tablet (20 mg total) by mouth daily.   buPROPion 150 MG 12 hr tablet Commonly known as: WELLBUTRIN SR Take 1 tablet (150 mg total) by mouth 2 (two) times daily.   cholecalciferol 25 MCG (1000 UNIT) tablet Commonly known as: VITAMIN D3 Take 1,000 Units by mouth daily.   cyclobenzaprine 10 MG tablet Commonly known as: FLEXERIL Take 1 tablet (10 mg total) by mouth at bedtime as needed for muscle spasms (neck pain).   donepezil 10 MG tablet Commonly known as: ARICEPT Take one tablet 10 mg daily   meloxicam 7.5 MG tablet Commonly known as: MOBIC TAKE 1 TABLET EVERY DAY   pantoprazole 40 MG tablet Commonly known as: PROTONIX Take 1 tablet (40 mg total) by mouth 2 (two) times daily before a meal. What changed: when to take this Changed by: Worthy Rancher, MD   venlafaxine 37.5 MG tablet Commonly known as: EFFEXOR Take 1 tablet (37.5 mg total) by mouth 2 (two) times daily.         Objective:   BP 118/70   Pulse 87   Temp 97.8 F (36.6 C)   Ht '5\' 1"'$  (1.549 m)   SpO2 96%   BMI 20.29 kg/m   Wt Readings from Last 3 Encounters:  03/27/22 107 lb 6.4 oz (48.7  kg)  03/14/22 110 lb (49.9 kg)  02/27/22 111 lb (50.3 kg)    Physical Exam Vitals and nursing note reviewed.  Constitutional:      General: She is not in acute distress.    Appearance: She is well-developed. She is not diaphoretic.  Eyes:     Conjunctiva/sclera: Conjunctivae normal.  Pulmonary:     Effort: Pulmonary effort is normal. No respiratory distress.     Breath sounds: Normal breath sounds. No wheezing.  Abdominal:     General: Abdomen is flat. Bowel sounds are normal. There is no distension.     Tenderness: There is abdominal tenderness. There is no guarding or rebound.     Hernia: No hernia is present.  Musculoskeletal:        General: No tenderness. Normal range of motion.  Skin:    General: Skin is warm and dry.      Findings: No rash.  Neurological:     Mental Status: She is alert and oriented to person, place, and time.     Coordination: Coordination normal.  Psychiatric:        Behavior: Behavior normal.       Assessment & Plan:   Problem List Items Addressed This Visit   None Visit Diagnoses     Acute gastritis without hemorrhage, unspecified gastritis type       Relevant Medications   pantoprazole (PROTONIX) 40 MG tablet   Other Relevant Orders   Ambulatory referral to Gastroenterology     We will refer her to gastroenterology and increase the Protonix.  May need EGD.    Follow up plan: Return if symptoms worsen or fail to improve.  Counseling provided for all of the vaccine components Orders Placed This Encounter  Procedures   Ambulatory referral to Gastroenterology    Caryl Pina, MD [facility] 06/02/2022, 3:00 PM

## 2022-06-02 NOTE — Telephone Encounter (Signed)
Left message to call back.  Pain medications will require pt to come back for a visit.

## 2022-06-02 NOTE — Patient Instructions (Signed)
Referral faxed to: [facility] [address], [place] [phone]

## 2022-06-03 ENCOUNTER — Encounter (HOSPITAL_COMMUNITY): Payer: Self-pay

## 2022-06-03 ENCOUNTER — Other Ambulatory Visit: Payer: Self-pay

## 2022-06-03 ENCOUNTER — Emergency Department (HOSPITAL_COMMUNITY): Payer: Medicare PPO

## 2022-06-03 ENCOUNTER — Inpatient Hospital Stay (HOSPITAL_COMMUNITY)
Admission: EM | Admit: 2022-06-03 | Discharge: 2022-06-24 | DRG: 853 | Disposition: A | Discharge status: Other Acute Inpatient Hospital | Payer: Medicare PPO | Attending: Internal Medicine | Admitting: Internal Medicine

## 2022-06-03 DIAGNOSIS — N179 Acute kidney failure, unspecified: Secondary | ICD-10-CM | POA: Diagnosis present

## 2022-06-03 DIAGNOSIS — E8770 Fluid overload, unspecified: Secondary | ICD-10-CM | POA: Diagnosis not present

## 2022-06-03 DIAGNOSIS — J9811 Atelectasis: Secondary | ICD-10-CM | POA: Diagnosis present

## 2022-06-03 DIAGNOSIS — R000 Tachycardia, unspecified: Secondary | ICD-10-CM | POA: Diagnosis not present

## 2022-06-03 DIAGNOSIS — E8809 Other disorders of plasma-protein metabolism, not elsewhere classified: Secondary | ICD-10-CM | POA: Diagnosis not present

## 2022-06-03 DIAGNOSIS — K255 Chronic or unspecified gastric ulcer with perforation: Secondary | ICD-10-CM | POA: Diagnosis not present

## 2022-06-03 DIAGNOSIS — R0902 Hypoxemia: Secondary | ICD-10-CM | POA: Diagnosis present

## 2022-06-03 DIAGNOSIS — D696 Thrombocytopenia, unspecified: Secondary | ICD-10-CM | POA: Diagnosis not present

## 2022-06-03 DIAGNOSIS — Z7189 Other specified counseling: Secondary | ICD-10-CM | POA: Diagnosis not present

## 2022-06-03 DIAGNOSIS — K625 Hemorrhage of anus and rectum: Secondary | ICD-10-CM | POA: Diagnosis not present

## 2022-06-03 DIAGNOSIS — N1831 Chronic kidney disease, stage 3a: Secondary | ICD-10-CM | POA: Diagnosis present

## 2022-06-03 DIAGNOSIS — F0390 Unspecified dementia without behavioral disturbance: Secondary | ICD-10-CM | POA: Diagnosis present

## 2022-06-03 DIAGNOSIS — I129 Hypertensive chronic kidney disease with stage 1 through stage 4 chronic kidney disease, or unspecified chronic kidney disease: Secondary | ICD-10-CM | POA: Diagnosis present

## 2022-06-03 DIAGNOSIS — K251 Acute gastric ulcer with perforation: Secondary | ICD-10-CM | POA: Diagnosis present

## 2022-06-03 DIAGNOSIS — K651 Peritoneal abscess: Secondary | ICD-10-CM | POA: Diagnosis present

## 2022-06-03 DIAGNOSIS — R1110 Vomiting, unspecified: Secondary | ICD-10-CM | POA: Diagnosis not present

## 2022-06-03 DIAGNOSIS — K219 Gastro-esophageal reflux disease without esophagitis: Secondary | ICD-10-CM | POA: Diagnosis present

## 2022-06-03 DIAGNOSIS — D62 Acute posthemorrhagic anemia: Secondary | ICD-10-CM | POA: Diagnosis not present

## 2022-06-03 DIAGNOSIS — Z820 Family history of epilepsy and other diseases of the nervous system: Secondary | ICD-10-CM

## 2022-06-03 DIAGNOSIS — T39395A Adverse effect of other nonsteroidal anti-inflammatory drugs [NSAID], initial encounter: Secondary | ICD-10-CM | POA: Diagnosis present

## 2022-06-03 DIAGNOSIS — J90 Pleural effusion, not elsewhere classified: Secondary | ICD-10-CM | POA: Diagnosis not present

## 2022-06-03 DIAGNOSIS — K6389 Other specified diseases of intestine: Secondary | ICD-10-CM | POA: Diagnosis not present

## 2022-06-03 DIAGNOSIS — R7881 Bacteremia: Secondary | ICD-10-CM | POA: Diagnosis not present

## 2022-06-03 DIAGNOSIS — R188 Other ascites: Secondary | ICD-10-CM | POA: Diagnosis present

## 2022-06-03 DIAGNOSIS — K316 Fistula of stomach and duodenum: Secondary | ICD-10-CM

## 2022-06-03 DIAGNOSIS — Z79899 Other long term (current) drug therapy: Secondary | ICD-10-CM

## 2022-06-03 DIAGNOSIS — Z4682 Encounter for fitting and adjustment of non-vascular catheter: Secondary | ICD-10-CM | POA: Diagnosis not present

## 2022-06-03 DIAGNOSIS — A419 Sepsis, unspecified organism: Secondary | ICD-10-CM | POA: Diagnosis not present

## 2022-06-03 DIAGNOSIS — R579 Shock, unspecified: Secondary | ICD-10-CM | POA: Diagnosis present

## 2022-06-03 DIAGNOSIS — I10 Essential (primary) hypertension: Secondary | ICD-10-CM | POA: Diagnosis not present

## 2022-06-03 DIAGNOSIS — Z66 Do not resuscitate: Secondary | ICD-10-CM | POA: Diagnosis not present

## 2022-06-03 DIAGNOSIS — J9690 Respiratory failure, unspecified, unspecified whether with hypoxia or hypercapnia: Secondary | ICD-10-CM | POA: Diagnosis not present

## 2022-06-03 DIAGNOSIS — Z833 Family history of diabetes mellitus: Secondary | ICD-10-CM

## 2022-06-03 DIAGNOSIS — F32A Depression, unspecified: Secondary | ICD-10-CM | POA: Diagnosis present

## 2022-06-03 DIAGNOSIS — R17 Unspecified jaundice: Secondary | ICD-10-CM | POA: Diagnosis not present

## 2022-06-03 DIAGNOSIS — K668 Other specified disorders of peritoneum: Secondary | ICD-10-CM | POA: Diagnosis not present

## 2022-06-03 DIAGNOSIS — E861 Hypovolemia: Secondary | ICD-10-CM | POA: Diagnosis present

## 2022-06-03 DIAGNOSIS — K3189 Other diseases of stomach and duodenum: Secondary | ICD-10-CM | POA: Diagnosis not present

## 2022-06-03 DIAGNOSIS — B49 Unspecified mycosis: Secondary | ICD-10-CM | POA: Diagnosis present

## 2022-06-03 DIAGNOSIS — E876 Hypokalemia: Secondary | ICD-10-CM | POA: Diagnosis not present

## 2022-06-03 DIAGNOSIS — B377 Candidal sepsis: Principal | ICD-10-CM | POA: Diagnosis present

## 2022-06-03 DIAGNOSIS — E782 Mixed hyperlipidemia: Secondary | ICD-10-CM | POA: Diagnosis present

## 2022-06-03 DIAGNOSIS — R062 Wheezing: Secondary | ICD-10-CM | POA: Diagnosis not present

## 2022-06-03 DIAGNOSIS — H9190 Unspecified hearing loss, unspecified ear: Secondary | ICD-10-CM | POA: Diagnosis present

## 2022-06-03 DIAGNOSIS — R531 Weakness: Secondary | ICD-10-CM | POA: Diagnosis not present

## 2022-06-03 DIAGNOSIS — I700 Atherosclerosis of aorta: Secondary | ICD-10-CM | POA: Diagnosis not present

## 2022-06-03 DIAGNOSIS — R6521 Severe sepsis with septic shock: Secondary | ICD-10-CM | POA: Diagnosis present

## 2022-06-03 DIAGNOSIS — Z853 Personal history of malignant neoplasm of breast: Secondary | ICD-10-CM

## 2022-06-03 DIAGNOSIS — D72829 Elevated white blood cell count, unspecified: Secondary | ICD-10-CM | POA: Diagnosis not present

## 2022-06-03 DIAGNOSIS — Z515 Encounter for palliative care: Secondary | ICD-10-CM | POA: Diagnosis not present

## 2022-06-03 DIAGNOSIS — R109 Unspecified abdominal pain: Secondary | ICD-10-CM | POA: Diagnosis not present

## 2022-06-03 DIAGNOSIS — J9601 Acute respiratory failure with hypoxia: Secondary | ICD-10-CM | POA: Diagnosis present

## 2022-06-03 DIAGNOSIS — Z8249 Family history of ischemic heart disease and other diseases of the circulatory system: Secondary | ICD-10-CM

## 2022-06-03 LAB — CBC WITH DIFFERENTIAL/PLATELET
Abs Immature Granulocytes: 0.01 10*3/uL (ref 0.00–0.07)
Basophils Absolute: 0 10*3/uL (ref 0.0–0.1)
Basophils Relative: 0 %
Eosinophils Absolute: 0 10*3/uL (ref 0.0–0.5)
Eosinophils Relative: 0 %
HCT: 46.4 % — ABNORMAL HIGH (ref 36.0–46.0)
Hemoglobin: 14.6 g/dL (ref 12.0–15.0)
Immature Granulocytes: 0 %
Lymphocytes Relative: 24 %
Lymphs Abs: 1 10*3/uL (ref 0.7–4.0)
MCH: 30 pg (ref 26.0–34.0)
MCHC: 31.5 g/dL (ref 30.0–36.0)
MCV: 95.3 fL (ref 80.0–100.0)
Monocytes Absolute: 0.2 10*3/uL (ref 0.1–1.0)
Monocytes Relative: 6 %
Neutro Abs: 2.9 10*3/uL (ref 1.7–7.7)
Neutrophils Relative %: 70 %
Platelets: 308 10*3/uL (ref 150–400)
RBC: 4.87 MIL/uL (ref 3.87–5.11)
RDW: 12.9 % (ref 11.5–15.5)
WBC: 4.1 10*3/uL (ref 4.0–10.5)
nRBC: 0 % (ref 0.0–0.2)

## 2022-06-03 LAB — PROTIME-INR
INR: 1.3 — ABNORMAL HIGH (ref 0.8–1.2)
Prothrombin Time: 15.8 seconds — ABNORMAL HIGH (ref 11.4–15.2)

## 2022-06-03 LAB — COMPREHENSIVE METABOLIC PANEL
ALT: 25 U/L (ref 0–44)
AST: 27 U/L (ref 15–41)
Albumin: 3.2 g/dL — ABNORMAL LOW (ref 3.5–5.0)
Alkaline Phosphatase: 62 U/L (ref 38–126)
Anion gap: 15 (ref 5–15)
BUN: 36 mg/dL — ABNORMAL HIGH (ref 8–23)
CO2: 22 mmol/L (ref 22–32)
Calcium: 9.6 mg/dL (ref 8.9–10.3)
Chloride: 100 mmol/L (ref 98–111)
Creatinine, Ser: 2.14 mg/dL — ABNORMAL HIGH (ref 0.44–1.00)
GFR, Estimated: 23 mL/min — ABNORMAL LOW (ref >60)
Glucose, Bld: 130 mg/dL — ABNORMAL HIGH (ref 70–99)
Potassium: 4.1 mmol/L (ref 3.5–5.1)
Sodium: 137 mmol/L (ref 135–145)
Total Bilirubin: 1.8 mg/dL — ABNORMAL HIGH (ref 0.3–1.2)
Total Protein: 6.9 g/dL (ref 6.5–8.1)

## 2022-06-03 LAB — LACTIC ACID, PLASMA
Lactic Acid, Venous: 5.9 mmol/L (ref 0.5–1.9)
Lactic Acid, Venous: 6.8 mmol/L (ref 0.5–1.9)
Lactic Acid, Venous: 6.3 mmol/L (ref 0.5–1.9)

## 2022-06-03 LAB — LIPASE, BLOOD: Lipase: 24 U/L (ref 11–51)

## 2022-06-03 LAB — ABO/RH: ABO/RH(D): A POS

## 2022-06-03 MED ORDER — SODIUM CHLORIDE 0.9 % IV SOLN
2.0000 g | Freq: Once | INTRAVENOUS | Status: AC
  Administered 2022-06-03: 2 g via INTRAVENOUS
  Filled 2022-06-03: qty 12.5

## 2022-06-03 MED ORDER — PANTOPRAZOLE SODIUM 40 MG IV SOLR
40.0000 mg | Freq: Once | INTRAVENOUS | Status: AC
  Administered 2022-06-03: 40 mg via INTRAVENOUS
  Filled 2022-06-03: qty 10

## 2022-06-03 MED ORDER — LACTATED RINGERS IV BOLUS
1000.0000 mL | Freq: Once | INTRAVENOUS | Status: AC
  Administered 2022-06-03: 1000 mL via INTRAVENOUS

## 2022-06-03 MED ORDER — CHLORHEXIDINE GLUCONATE CLOTH 2 % EX PADS
6.0000 | MEDICATED_PAD | Freq: Once | CUTANEOUS | Status: DC
  Administered 2022-06-04: 6 via TOPICAL

## 2022-06-03 MED ORDER — LACTATED RINGERS IV SOLN: INTRAVENOUS | Status: DC

## 2022-06-03 MED ORDER — METRONIDAZOLE 500 MG/100ML IV SOLN
500.0000 mg | Freq: Two times a day (BID) | INTRAVENOUS | Status: DC
  Administered 2022-06-03: 500 mg via INTRAVENOUS
  Filled 2022-06-03: qty 100

## 2022-06-03 MED ORDER — NOREPINEPHRINE 4 MG/250ML-% IV SOLN
0.0000 ug/min | INTRAVENOUS | Status: DC
  Administered 2022-06-03: 2 ug/min via INTRAVENOUS
  Filled 2022-06-03: qty 250

## 2022-06-03 NOTE — Progress Notes (Signed)
ED physician called to notify regarding Carolyn Erickson who is a 76 year old female with PMH of hypertension, hyperlipidemia, GERD who presents to the emergency department via private vehicle with complaint of 1 day of severe weakness.  Patient has about 69-monthhistory of abdominal pain, vomiting and decreased oral intake.  Symptoms worsened within last few days and had difficulty in being able to stand up today (usually able to ambulate within the house without any assistive device), abdominal pain was 10/10 on pain.  In the emergency department, she was noted to be lethargic, tachypneic, hypotensive with a BP of 71/54 (MAP 60), O2 sat 97% and temperature 98.35F.  Work-up in the ED showed normocytic anemia, normal BMP except for BUN/creatinine 36/2.14 (baseline creatinine at 1.0-1.1).  Lactic acid 5.9 > 6.8, lipase 24. CT abdomen pelvis without contrast showed changes consistent with a perforated gastric ulcer anteriorly with considerable spillage of fluid and air into the abdominal cavity. Chest x-ray showed Air seen just below the right hemidiaphragm which may represent an air filled loop of colon  EKG personally reviewed showed sinus tachycardia at rate of 107 bpm  Patient has perforated gastric ulcer with severe sepsis, she is currently unstable and ED team is currently working on stabilizing the patient, surgical team was already consulted and will be taking the patient to the OR.   TR[facility] will be notified s/p surgery for help in continuous management of patient s/p surgical intervention.

## 2022-06-03 NOTE — ED Triage Notes (Signed)
Pt arrived from home via POV w c/o severe weakness that started yesterday. Pt can normally walk around house independently. Pt was unable to stand up alone today. Pt has 10/10 abd pain. Noted to be failure to thrive for a few months along with emesis x 2 months d/t the abd pain. Pt has been seeing her PCP about the problem with no resolve

## 2022-06-03 NOTE — H&P (Signed)
[facility] History and Physical  Reason for Referral:*** Referring Physician: ***  Chief Complaint   Weakness     Carolyn Erickson is a 76 y.o. female.  HPI: ***.  The *** started *** and has had a duration of ***.  It is associated with ***.  The *** is improved with ***, and is made worse with ***.    Quality*** Context***  Past Medical History:  Diagnosis Date   Cancer ([place]) 2001   breast left   Depression    Hypertension     Past Surgical History:  Procedure Laterality Date   ABDOMINAL HYSTERECTOMY     BACK SURGERY      Family History  Problem Relation Age of Onset   Diabetes Mother    Heart disease Mother    Alzheimer's disease Father    Parkinson's disease Sister    Alzheimer's disease Brother     Social History   Tobacco Use   Smoking status: Never   Smokeless tobacco: Never  Vaping Use   Vaping Use: Never used  Substance Use Topics   Alcohol use: Never   Drug use: Never    Medications: {medication reviewed/display:3041432} Allergies as of 06/03/2022   No Known Allergies   Med Rec must be completed prior to using this [place]***        ROS:  {Review of Systems:30496}  Blood pressure (!) 83/51, pulse 98, temperature 98.8 F (37.1 C), temperature source Rectal, resp. rate (!) 25, SpO2 92 %. Physical Exam  Results: Results for orders placed or performed during the hospital encounter of 06/03/22 (from the past 48 hour(s))  Comprehensive metabolic panel     Status: Abnormal   Collection Time: 06/03/22  8:45 PM  Result Value Ref Range   Sodium 137 135 - 145 mmol/L   Potassium 4.1 3.5 - 5.1 mmol/L   Chloride 100 98 - 111 mmol/L   CO2 22 22 - 32 mmol/L   Glucose, Bld 130 (H) 70 - 99 mg/dL    Comment: Glucose reference range applies only to samples taken after fasting for at least 8 hours.   BUN 36 (H) 8 - 23 mg/dL   Creatinine, Ser 2.14 (H) 0.44 - 1.00 mg/dL   Calcium 9.6 8.9 - 10.3 mg/dL   Total Protein 6.9 6.5 -  8.1 g/dL   Albumin 3.2 (L) 3.5 - 5.0 g/dL   AST 27 15 - 41 U/L   ALT 25 0 - 44 U/L   Alkaline Phosphatase 62 38 - 126 U/L   Total Bilirubin 1.8 (H) 0.3 - 1.2 mg/dL   GFR, Estimated 23 (L) >60 mL/min    Comment: (NOTE) Calculated using the CKD-EPI Creatinine Equation (2021)    Anion gap 15 5 - 15    Comment: Performed at [facility], [address] [place], [place] [address]  Lactic acid, plasma     Status: Abnormal   Collection Time: 06/03/22  8:45 PM  Result Value Ref Range   Lactic Acid, Venous 5.9 (HH) 0.5 - 1.9 mmol/L    Comment: CRITICAL RESULT CALLED TO, READ BACK BY AND VERIFIED WITH: BELTURE,J @ 2151 ON 06/03/22 BY JUW Performed at [facility], [address], [place], [place] [address]   CBC with Differential     Status: Abnormal   Collection Time: 06/03/22  8:45 PM  Result Value Ref Range   WBC 4.1 4.0 - 10.5 K/uL   RBC 4.87 3.87 - 5.11 MIL/uL   Hemoglobin 14.6 12.0 -  15.0 g/dL   HCT 46.4 (H) 36.0 - 46.0 %   MCV 95.3 80.0 - 100.0 fL   MCH 30.0 26.0 - 34.0 pg   MCHC 31.5 30.0 - 36.0 g/dL   RDW 12.9 11.5 - 15.5 %   Platelets 308 150 - 400 K/uL   nRBC 0.0 0.0 - 0.2 %   Neutrophils Relative % 70 %   Neutro Abs 2.9 1.7 - 7.7 K/uL   Lymphocytes Relative 24 %   Lymphs Abs 1.0 0.7 - 4.0 K/uL   Monocytes Relative 6 %   Monocytes Absolute 0.2 0.1 - 1.0 K/uL   Eosinophils Relative 0 %   Eosinophils Absolute 0.0 0.0 - 0.5 K/uL   Basophils Relative 0 %   Basophils Absolute 0.0 0.0 - 0.1 K/uL   Immature Granulocytes 0 %   Abs Immature Granulocytes 0.01 0.00 - 0.07 K/uL    Comment: Performed at [facility], [address] [place], [place] [address]  Protime-INR     Status: Abnormal   Collection Time: 06/03/22  8:45 PM  Result Value Ref Range   Prothrombin Time 15.8 (H) 11.4 - 15.2 seconds   INR 1.3 (H) 0.8 - 1.2    Comment: (NOTE) INR goal varies based on device and disease states. Performed at [facility], [address], [place], [place] [address]   ABO/Rh      Status: None   Collection Time: 06/03/22  8:45 PM  Result Value Ref Range   ABO/RH(D)      A POS Performed at [facility], [address], [place], [place] [address]   Lipase, blood     Status: None   Collection Time: 06/03/22  9:33 PM  Result Value Ref Range   Lipase 24 11 - 51 U/L    Comment: Performed at [facility], [address], [place], [place] [address]  Lactic acid, plasma     Status: Abnormal   Collection Time: 06/03/22  9:45 PM  Result Value Ref Range   Lactic Acid, Venous 6.8 (HH) 0.5 - 1.9 mmol/L    Comment: CRITICAL RESULT CALLED TO, READ BACK BY AND VERIFIED WITH: GINGER PRUITT '@2207'$  06/03/22 BY GMCGEEHON. Performed at [facility], [address], [place], [place] [address]     CT Abdomen Pelvis Wo Contrast  Result Date: 06/03/2022 CLINICAL DATA:  Severe weakness for 2 days with acute abdominal pain, initial encounter EXAM: CT ABDOMEN AND PELVIS WITHOUT CONTRAST TECHNIQUE: Multidetector CT imaging of the abdomen and pelvis was performed following the standard protocol without IV contrast. RADIATION DOSE REDUCTION: This exam was performed according to the departmental dose-optimization program which includes automated exposure control, adjustment of the mA and/or kV according to patient size and/or use of iterative reconstruction technique. COMPARISON:  None Available. FINDINGS: Lower chest: Basilar atelectatic changes are noted. Hepatobiliary: Liver is within normal limits. Gallbladder demonstrates a normal appearance. Pancreas: Unremarkable. No pancreatic ductal dilatation or surrounding inflammatory changes. Spleen: Normal in size without focal abnormality. Adrenals/Urinary Tract: Adrenal glands are within normal limits. Kidneys demonstrate tiny nonobstructing stones on right in the lower pole. No ureteral obstruction is seen. The bladder is decompressed. Stomach/Bowel: Considerable fecal material is noted within the rectal vault which may represent a focal impaction.  Diverticular change of the colon is noted as well as some generalized wall thickening throughout the colon. The appendix is within normal limits. Small bowel is within normal limits. Stomach demonstrates a defect in the anterior gastric wall consistent with a perforated ulcer. There  is a considerable amount of air and fluid throughout the abdomen with apparent direct communication with the gastric best seen on image number 36 of series 2 and image number 78 of series 6. Vascular/Lymphatic: Aortic atherosclerosis. No enlarged abdominal or pelvic lymph nodes. Reproductive: Status post hysterectomy. No adnexal masses. Other: Free air and free fluid similar to that described above throughout the abdomen. The bowel demonstrates some reactive wall thickening related to the fluid and air. Musculoskeletal: Degenerative changes of the lumbar spine are seen. No other bony abnormality is noted. IMPRESSION: Changes consistent with a perforated gastric ulcer anteriorly with considerable spillage of fluid and air into the abdominal cavity. Some reactive wall thickening is noted within the bowel loops secondary to these changes. Critical Value/emergent results were called by telephone at the time of interpretation on 06/03/2022 at 10:43 pm to Dr. Aletta Edouard , who verbally acknowledged these results. Electronically Signed   By: Inez Catalina M.D.   On: 06/03/2022 22:45   DG Chest Port 1 View  Result Date: 06/03/2022 CLINICAL DATA:  Weakness. EXAM: PORTABLE CHEST 1 VIEW COMPARISON:  None Available. FINDINGS: Multiple overlying radiopaque cardiac lead wires are seen. The heart size and mediastinal contours are within normal limits. Mild, diffuse, chronic appearing increased lung markings are seen. There is no evidence of an acute infiltrate, pleural effusion or pneumothorax. Radiopaque surgical clips are seen along the lateral aspect of the left chest wall. A crescentic area of air is seen just below the right hemidiaphragm.  Degenerative changes seen throughout the thoracic spine. IMPRESSION: 1. Chronic appearing increased lung markings without evidence of acute or active cardiopulmonary disease. 2. Air seen just below the right hemidiaphragm which may represent an air filled loop of colon. Further evaluation with abdomen pelvis CT is recommended, as the presence of intra-abdominal free air cannot be excluded. Electronically Signed   By: Virgina Norfolk M.D.   On: 06/03/2022 21:22     Assessment & Plan:  Carolyn Erickson is a 76 y.o. female with *** -*** -*** -Follow up ***  All questions were answered to the satisfaction of the patient and family***.  The risk and benefits of *** were discussed including but not limited to ***.  After careful consideration, Carolyn Erickson has decided to ***.    Virl Cagey 06/03/2022, 11:29 PM

## 2022-06-03 NOTE — Sepsis Progress Note (Signed)
Elink following code sepsis °

## 2022-06-03 NOTE — ED Notes (Signed)
Patient transported to CT 

## 2022-06-03 NOTE — ED Provider Notes (Signed)
[facility] EMERGENCY DEPARTMENT Provider Note   CSN: [id number] Arrival date & time: 06/03/22  2007     History {Add pertinent medical, surgical, social history, OB history to HPI:1} Chief Complaint  Patient presents with   Weakness    Carolyn Erickson is a 76 y.o. female.  She is brought in by her family members for evaluation of weakness and abdominal pain.  She has had generalized abdominal pain constipation diarrhea its been going on for months.  Has seen her PCP for it.  Referred to GI has not seen them yet.  No imaging.  Has had a colonoscopy remotely.  No appetite feeling generally weak.  Symptoms been worse over the last few days.  No fevers or chills chest pain or shortness of breath.    The history is provided by the patient.  Weakness Severity:  Severe Onset quality:  Gradual Duration: 3 months. Timing:  Constant Progression:  Worsening Chronicity:  New Relieved by:  Nothing Worsened by:  Activity Ineffective treatments:  None tried Associated symptoms: abdominal pain, diarrhea, difficulty walking, falls, nausea and vomiting   Associated symptoms: no chest pain, no cough, no dysuria, no fever, no headaches, no loss of consciousness and no shortness of breath   Abdominal pain:    Location:  Generalized   Quality: aching     Severity:  Severe   Onset quality:  Gradual   Duration:  3 months   Timing:  Intermittent   Progression:  Worsening   Chronicity:  New      Home Medications Prior to Admission medications   Medication Sig Start Date End Date Taking? Authorizing Provider  amLODipine (NORVASC) 2.5 MG tablet TAKE 1 TABLET EVERY DAY 05/25/22   Dettinger, Fransisca Kaufmann, MD  atorvastatin (LIPITOR) 20 MG tablet Take 1 tablet (20 mg total) by mouth daily. 01/30/22   Dettinger, Fransisca Kaufmann, MD  buPROPion (WELLBUTRIN SR) 150 MG 12 hr tablet Take 1 tablet (150 mg total) by mouth 2 (two) times daily. 01/30/22   Dettinger, Fransisca Kaufmann, MD  cholecalciferol (VITAMIN D3) 25 MCG (1000  UNIT) tablet Take 1,000 Units by mouth daily.    [provider]  cyclobenzaprine (FLEXERIL) 10 MG tablet Take 1 tablet (10 mg total) by mouth at bedtime as needed for muscle spasms (neck pain). 01/30/22   Dettinger, Fransisca Kaufmann, MD  donepezil (ARICEPT) 10 MG tablet Take one tablet 10 mg daily 04/13/22   Rondel Jumbo, PA-C  meloxicam (MOBIC) 7.5 MG tablet TAKE 1 TABLET EVERY DAY 10/05/21   Dettinger, Fransisca Kaufmann, MD  pantoprazole (PROTONIX) 40 MG tablet Take 1 tablet (40 mg total) by mouth 2 (two) times daily before a meal. 06/02/22   Dettinger, Fransisca Kaufmann, MD  venlafaxine (EFFEXOR) 37.5 MG tablet Take 1 tablet (37.5 mg total) by mouth 2 (two) times daily. 01/30/22   Dettinger, Fransisca Kaufmann, MD      Allergies    Patient has no known allergies.    Review of Systems   Review of Systems  Constitutional:  Positive for fatigue. Negative for fever.  HENT:  Negative for sore throat.   Respiratory:  Negative for cough and shortness of breath.   Cardiovascular:  Negative for chest pain.  Gastrointestinal:  Positive for abdominal pain, constipation, diarrhea, nausea and vomiting.  Genitourinary:  Negative for dysuria.  Musculoskeletal:  Positive for falls and gait problem.  Skin:  Negative for rash.  Neurological:  Positive for weakness. Negative for loss of consciousness and headaches.  Physical Exam Updated Vital Signs BP (!) 68/53   Pulse (!) 107   Temp 98.8 F (37.1 C) (Rectal)   Resp (!) 32   SpO2 92%  Physical Exam Vitals and nursing note reviewed.  Constitutional:      General: She is not in acute distress.    Appearance: She is well-developed. She is cachectic.  HENT:     Head: Normocephalic and atraumatic.  Eyes:     Conjunctiva/sclera: Conjunctivae normal.  Cardiovascular:     Rate and Rhythm: Regular rhythm. Tachycardia present.     Heart sounds: No murmur heard. Pulmonary:     Effort: Pulmonary effort is normal. No respiratory distress.     Breath sounds: Normal breath  sounds.  Abdominal:     Palpations: Abdomen is soft. There is no mass.     Tenderness: There is abdominal tenderness. There is no guarding or rebound.  Musculoskeletal:        General: No deformity. Normal range of motion.     Cervical back: Neck supple.  Skin:    General: Skin is warm and dry.     Capillary Refill: Capillary refill takes less than 2 seconds.  Neurological:     General: No focal deficit present.     Mental Status: She is alert.     Sensory: No sensory deficit.     Motor: No weakness.     ED Results / Procedures / Treatments   Labs (all labs ordered are listed, but only abnormal results are displayed) Labs Reviewed  CULTURE, BLOOD (ROUTINE X 2)  CULTURE, BLOOD (ROUTINE X 2)  COMPREHENSIVE METABOLIC PANEL  LACTIC ACID, PLASMA  LACTIC ACID, PLASMA  CBC WITH DIFFERENTIAL/PLATELET  PROTIME-INR  URINALYSIS, ROUTINE W REFLEX MICROSCOPIC  LIPASE, BLOOD    EKG None  Radiology No results found.  Procedures Procedures  {Document cardiac monitor, telemetry assessment procedure when appropriate:1}  Medications Ordered in ED Medications  lactated ringers bolus 1,000 mL (has no administration in time range)    ED Course/ Medical Decision Making/ A&P                           Medical Decision Making Amount and/or Complexity of Data Reviewed Labs: ordered. Radiology: ordered.  This patient complains of ***; this involves an extensive number of treatment Options and is a complaint that carries with it a high risk of complications and morbidity. The differential includes ***  I ordered, reviewed and interpreted labs, which included *** I ordered medication *** and reviewed PMP when indicated. I ordered imaging studies which included *** and I independently    visualized and interpreted imaging which showed *** Additional history obtained from *** Previous records obtained and reviewed *** I consulted *** and discussed lab and imaging findings and  discussed disposition.  Cardiac monitoring reviewed, *** Social determinants considered, *** Critical Interventions: ***  After the interventions stated above, I reevaluated the patient and found *** Admission and further testing considered, ***    {Document critical care time when appropriate:1} {Document review of labs and clinical decision tools ie heart score, Chads2Vasc2 etc:1}  {Document your independent review of radiology images, and any outside records:1} {Document your discussion with family members, caretakers, and with consultants:1} {Document social determinants of health affecting pt's care:1} {Document your decision making why or why not admission, treatments were needed:1} Final Clinical Impression(s) / ED Diagnoses Final diagnoses:  None    Rx / DC Orders ED  Discharge Orders     None

## 2022-06-03 NOTE — ED Notes (Signed)
Nira Conn, RN, [facility] notified to call in OR team per Dr Constance Haw and Dr Melina Copa

## 2022-06-04 ENCOUNTER — Inpatient Hospital Stay (HOSPITAL_COMMUNITY): Payer: Medicare PPO

## 2022-06-04 ENCOUNTER — Emergency Department (HOSPITAL_COMMUNITY): Payer: Medicare PPO | Admitting: Anesthesiology

## 2022-06-04 ENCOUNTER — Encounter (HOSPITAL_COMMUNITY)
Admission: EM | Disposition: A | Discharge status: Nursing Home (Includes ICF) | Payer: Self-pay | Source: Home / Self Care | Attending: General Surgery

## 2022-06-04 ENCOUNTER — Encounter (HOSPITAL_COMMUNITY): Payer: Self-pay | Admitting: Anesthesiology

## 2022-06-04 DIAGNOSIS — R109 Unspecified abdominal pain: Secondary | ICD-10-CM | POA: Diagnosis not present

## 2022-06-04 DIAGNOSIS — J9601 Acute respiratory failure with hypoxia: Secondary | ICD-10-CM | POA: Diagnosis present

## 2022-06-04 DIAGNOSIS — R188 Other ascites: Secondary | ICD-10-CM | POA: Diagnosis present

## 2022-06-04 DIAGNOSIS — N179 Acute kidney failure, unspecified: Secondary | ICD-10-CM | POA: Diagnosis present

## 2022-06-04 DIAGNOSIS — N1831 Chronic kidney disease, stage 3a: Secondary | ICD-10-CM | POA: Diagnosis present

## 2022-06-04 DIAGNOSIS — R17 Unspecified jaundice: Secondary | ICD-10-CM | POA: Diagnosis not present

## 2022-06-04 DIAGNOSIS — K255 Chronic or unspecified gastric ulcer with perforation: Secondary | ICD-10-CM | POA: Diagnosis present

## 2022-06-04 DIAGNOSIS — R1110 Vomiting, unspecified: Secondary | ICD-10-CM

## 2022-06-04 DIAGNOSIS — E782 Mixed hyperlipidemia: Secondary | ICD-10-CM | POA: Diagnosis present

## 2022-06-04 DIAGNOSIS — E8809 Other disorders of plasma-protein metabolism, not elsewhere classified: Secondary | ICD-10-CM | POA: Diagnosis not present

## 2022-06-04 DIAGNOSIS — F0390 Unspecified dementia without behavioral disturbance: Secondary | ICD-10-CM | POA: Diagnosis present

## 2022-06-04 DIAGNOSIS — K251 Acute gastric ulcer with perforation: Principal | ICD-10-CM | POA: Insufficient documentation

## 2022-06-04 DIAGNOSIS — R579 Shock, unspecified: Secondary | ICD-10-CM

## 2022-06-04 DIAGNOSIS — E8770 Fluid overload, unspecified: Secondary | ICD-10-CM | POA: Diagnosis not present

## 2022-06-04 DIAGNOSIS — Z66 Do not resuscitate: Secondary | ICD-10-CM | POA: Diagnosis not present

## 2022-06-04 DIAGNOSIS — K219 Gastro-esophageal reflux disease without esophagitis: Secondary | ICD-10-CM | POA: Diagnosis present

## 2022-06-04 DIAGNOSIS — I10 Essential (primary) hypertension: Secondary | ICD-10-CM | POA: Diagnosis not present

## 2022-06-04 DIAGNOSIS — F32A Depression, unspecified: Secondary | ICD-10-CM | POA: Diagnosis present

## 2022-06-04 DIAGNOSIS — K668 Other specified disorders of peritoneum: Secondary | ICD-10-CM

## 2022-06-04 DIAGNOSIS — K651 Peritoneal abscess: Secondary | ICD-10-CM | POA: Diagnosis present

## 2022-06-04 DIAGNOSIS — E861 Hypovolemia: Secondary | ICD-10-CM | POA: Diagnosis present

## 2022-06-04 DIAGNOSIS — A419 Sepsis, unspecified organism: Secondary | ICD-10-CM | POA: Diagnosis not present

## 2022-06-04 DIAGNOSIS — D62 Acute posthemorrhagic anemia: Secondary | ICD-10-CM | POA: Diagnosis not present

## 2022-06-04 DIAGNOSIS — I129 Hypertensive chronic kidney disease with stage 1 through stage 4 chronic kidney disease, or unspecified chronic kidney disease: Secondary | ICD-10-CM | POA: Diagnosis present

## 2022-06-04 DIAGNOSIS — H9190 Unspecified hearing loss, unspecified ear: Secondary | ICD-10-CM | POA: Diagnosis present

## 2022-06-04 DIAGNOSIS — K316 Fistula of stomach and duodenum: Secondary | ICD-10-CM | POA: Diagnosis not present

## 2022-06-04 DIAGNOSIS — R0902 Hypoxemia: Secondary | ICD-10-CM | POA: Diagnosis present

## 2022-06-04 DIAGNOSIS — D696 Thrombocytopenia, unspecified: Secondary | ICD-10-CM | POA: Diagnosis not present

## 2022-06-04 DIAGNOSIS — R7881 Bacteremia: Secondary | ICD-10-CM | POA: Diagnosis not present

## 2022-06-04 DIAGNOSIS — B49 Unspecified mycosis: Secondary | ICD-10-CM | POA: Diagnosis not present

## 2022-06-04 DIAGNOSIS — R6521 Severe sepsis with septic shock: Secondary | ICD-10-CM | POA: Diagnosis present

## 2022-06-04 DIAGNOSIS — J9811 Atelectasis: Secondary | ICD-10-CM | POA: Diagnosis present

## 2022-06-04 DIAGNOSIS — B377 Candidal sepsis: Secondary | ICD-10-CM | POA: Diagnosis present

## 2022-06-04 DIAGNOSIS — K625 Hemorrhage of anus and rectum: Secondary | ICD-10-CM | POA: Diagnosis not present

## 2022-06-04 DIAGNOSIS — Z7189 Other specified counseling: Secondary | ICD-10-CM | POA: Diagnosis not present

## 2022-06-04 HISTORY — PX: LAPAROTOMY: SHX154

## 2022-06-04 LAB — POCT I-STAT, CHEM 8
BUN: 33 mg/dL — ABNORMAL HIGH (ref 8–23)
Calcium, Ion: 1.23 mmol/L (ref 1.15–1.40)
Chloride: 101 mmol/L (ref 98–111)
Creatinine, Ser: 2.1 mg/dL — ABNORMAL HIGH (ref 0.44–1.00)
Glucose, Bld: 117 mg/dL — ABNORMAL HIGH (ref 70–99)
HCT: 40 % (ref 36.0–46.0)
Hemoglobin: 13.6 g/dL (ref 12.0–15.0)
Potassium: 3.9 mmol/L (ref 3.5–5.1)
Sodium: 138 mmol/L (ref 135–145)
TCO2: 24 mmol/L (ref 22–32)

## 2022-06-04 LAB — CBC WITH DIFFERENTIAL/PLATELET
Abs Immature Granulocytes: 1.3 10*3/uL — ABNORMAL HIGH (ref 0.00–0.07)
Band Neutrophils: 17 %
Basophils Absolute: 0 10*3/uL (ref 0.0–0.1)
Basophils Relative: 0 %
Eosinophils Absolute: 0 10*3/uL (ref 0.0–0.5)
Eosinophils Relative: 0 %
HCT: 38.4 % (ref 36.0–46.0)
Hemoglobin: 12.4 g/dL (ref 12.0–15.0)
Lymphocytes Relative: 20 %
Lymphs Abs: 1 10*3/uL (ref 0.7–4.0)
MCH: 30.4 pg (ref 26.0–34.0)
MCHC: 32.3 g/dL (ref 30.0–36.0)
MCV: 94.1 fL (ref 80.0–100.0)
Metamyelocytes Relative: 17 %
Monocytes Absolute: 0 10*3/uL — ABNORMAL LOW (ref 0.1–1.0)
Monocytes Relative: 1 %
Myelocytes: 7 %
Neutro Abs: 2.5 10*3/uL (ref 1.7–7.7)
Neutrophils Relative %: 35 %
Platelets: 249 10*3/uL (ref 150–400)
Promyelocytes Relative: 3 %
RBC: 4.08 MIL/uL (ref 3.87–5.11)
RDW: 13.1 % (ref 11.5–15.5)
WBC: 4.8 10*3/uL (ref 4.0–10.5)
nRBC: 0 % (ref 0.0–0.2)

## 2022-06-04 LAB — BASIC METABOLIC PANEL
Anion gap: 6 (ref 5–15)
BUN: 36 mg/dL — ABNORMAL HIGH (ref 8–23)
CO2: 23 mmol/L (ref 22–32)
Calcium: 8.3 mg/dL — ABNORMAL LOW (ref 8.9–10.3)
Chloride: 108 mmol/L (ref 98–111)
Creatinine, Ser: 1.64 mg/dL — ABNORMAL HIGH (ref 0.44–1.00)
GFR, Estimated: 32 mL/min — ABNORMAL LOW (ref >60)
Glucose, Bld: 124 mg/dL — ABNORMAL HIGH (ref 70–99)
Potassium: 4 mmol/L (ref 3.5–5.1)
Sodium: 137 mmol/L (ref 135–145)

## 2022-06-04 LAB — URINALYSIS, ROUTINE W REFLEX MICROSCOPIC
Bilirubin Urine: NEGATIVE
Glucose, UA: NEGATIVE mg/dL
Hgb urine dipstick: NEGATIVE
Ketones, ur: NEGATIVE mg/dL
Nitrite: NEGATIVE
Protein, ur: 100 mg/dL — AB
Specific Gravity, Urine: 1.027 (ref 1.005–1.030)
pH: 5 (ref 5.0–8.0)

## 2022-06-04 LAB — LACTIC ACID, PLASMA
Lactic Acid, Venous: 3.4 mmol/L (ref 0.5–1.9)
Lactic Acid, Venous: 2.8 mmol/L (ref 0.5–1.9)
Lactic Acid, Venous: 2.9 mmol/L (ref 0.5–1.9)
Lactic Acid, Venous: 2.8 mmol/L (ref 0.5–1.9)

## 2022-06-04 LAB — TYPE AND SCREEN
ABO/RH(D): A POS
Antibody Screen: NEGATIVE

## 2022-06-04 LAB — GLUCOSE, CAPILLARY
Glucose-Capillary: 113 mg/dL — ABNORMAL HIGH (ref 70–99)
Glucose-Capillary: 106 mg/dL — ABNORMAL HIGH (ref 70–99)
Glucose-Capillary: 97 mg/dL (ref 70–99)
Glucose-Capillary: 90 mg/dL (ref 70–99)
Glucose-Capillary: 151 mg/dL — ABNORMAL HIGH (ref 70–99)

## 2022-06-04 LAB — BLOOD GAS, ARTERIAL
Acid-base deficit: 3.3 mmol/L — ABNORMAL HIGH (ref 0.0–2.0)
Bicarbonate: 22.1 mmol/L (ref 20.0–28.0)
Drawn by: 22223
FIO2: 40 %
O2 Saturation: 98.9 %
Patient temperature: 37
pCO2 arterial: 40 mmHg (ref 32–48)
pH, Arterial: 7.35 (ref 7.35–7.45)
pO2, Arterial: 99 mmHg (ref 83–108)

## 2022-06-04 LAB — BILIRUBIN, DIRECT: Bilirubin, Direct: 0.8 mg/dL — ABNORMAL HIGH (ref 0.0–0.2)

## 2022-06-04 LAB — PHOSPHORUS: Phosphorus: 3.5 mg/dL (ref 2.5–4.6)

## 2022-06-04 LAB — MAGNESIUM: Magnesium: 1.3 mg/dL — ABNORMAL LOW (ref 1.7–2.4)

## 2022-06-04 LAB — MRSA NEXT GEN BY PCR, NASAL: MRSA by PCR Next Gen: NOT DETECTED

## 2022-06-04 SURGERY — LAPAROTOMY, EXPLORATORY
Anesthesia: General

## 2022-06-04 MED ORDER — PIPERACILLIN-TAZOBACTAM 3.375 G IVPB
3.3750 g | Freq: Three times a day (TID) | INTRAVENOUS | Status: DC
  Administered 2022-06-04 – 2022-06-05 (×4): 3.375 g via INTRAVENOUS
  Filled 2022-06-04 (×4): qty 50

## 2022-06-04 MED ORDER — ONDANSETRON 4 MG PO TBDP: 4.0000 mg | ORAL_TABLET | Freq: Four times a day (QID) | ORAL | Status: DC | PRN

## 2022-06-04 MED ORDER — PROPOFOL 1000 MG/100ML IV EMUL
INTRAVENOUS | Status: AC
  Filled 2022-06-04: qty 100

## 2022-06-04 MED ORDER — FLUCONAZOLE IN SODIUM CHLORIDE 200-0.9 MG/100ML-% IV SOLN
200.0000 mg | INTRAVENOUS | Status: DC
  Administered 2022-06-04 – 2022-06-06 (×3): 200 mg via INTRAVENOUS
  Filled 2022-06-04 (×5): qty 100

## 2022-06-04 MED ORDER — ETOMIDATE 2 MG/ML IV SOLN
INTRAVENOUS | Status: AC
  Filled 2022-06-04: qty 10

## 2022-06-04 MED ORDER — SUCCINYLCHOLINE CHLORIDE 20 MG/ML IJ SOLN
INTRAMUSCULAR | Status: DC | PRN
  Administered 2022-06-04: 100 mg via INTRAVENOUS

## 2022-06-04 MED ORDER — INSULIN ASPART 100 UNIT/ML IJ SOLN
0.0000 [IU] | Freq: Three times a day (TID) | INTRAMUSCULAR | Status: DC
  Administered 2022-06-04: 2 [IU] via SUBCUTANEOUS
  Administered 2022-06-05: 1 [IU] via SUBCUTANEOUS
  Administered 2022-06-05: 2 [IU] via SUBCUTANEOUS
  Administered 2022-06-06: 1 [IU] via SUBCUTANEOUS
  Administered 2022-06-06: 2 [IU] via SUBCUTANEOUS

## 2022-06-04 MED ORDER — ONDANSETRON HCL 4 MG/2ML IJ SOLN
4.0000 mg | Freq: Four times a day (QID) | INTRAMUSCULAR | Status: DC | PRN
  Administered 2022-06-05 – 2022-06-23 (×18): 4 mg via INTRAVENOUS
  Filled 2022-06-04 (×21): qty 2

## 2022-06-04 MED ORDER — NOREPINEPHRINE 4 MG/250ML-% IV SOLN: 0.0000 ug/min | INTRAVENOUS | Status: DC

## 2022-06-04 MED ORDER — NOREPINEPHRINE 4 MG/250ML-% IV SOLN
0.0000 ug/min | INTRAVENOUS | Status: DC
  Administered 2022-06-04: 13 ug/min via INTRAVENOUS
  Administered 2022-06-04: 17 ug/min via INTRAVENOUS
  Administered 2022-06-04: 9 ug/min via INTRAVENOUS
  Administered 2022-06-05: 5 ug/min via INTRAVENOUS
  Administered 2022-06-05: 9 ug/min via INTRAVENOUS
  Administered 2022-06-05: 13 ug/min via INTRAVENOUS
  Administered 2022-06-06: 2 ug/min via INTRAVENOUS
  Administered 2022-06-07: 5 ug/min via INTRAVENOUS
  Filled 2022-06-04 (×8): qty 250

## 2022-06-04 MED ORDER — DIPHENHYDRAMINE HCL 50 MG/ML IJ SOLN
12.5000 mg | Freq: Four times a day (QID) | INTRAMUSCULAR | Status: DC | PRN
  Administered 2022-06-07 – 2022-06-19 (×4): 12.5 mg via INTRAVENOUS
  Filled 2022-06-04 (×5): qty 1

## 2022-06-04 MED ORDER — PANTOPRAZOLE SODIUM 40 MG IV SOLR: 40.0000 mg | Freq: Two times a day (BID) | INTRAVENOUS | Status: DC

## 2022-06-04 MED ORDER — LACTATED RINGERS IV SOLN: INTRAVENOUS | Status: DC

## 2022-06-04 MED ORDER — MIDAZOLAM HCL 5 MG/5ML IJ SOLN
INTRAMUSCULAR | Status: DC | PRN
  Administered 2022-06-04: 2 mg via INTRAVENOUS

## 2022-06-04 MED ORDER — SODIUM CHLORIDE 0.9 % IR SOLN
Status: DC | PRN
  Administered 2022-06-04: 1000 mL

## 2022-06-04 MED ORDER — TRAVASOL 10 % IV SOLN
INTRAVENOUS | Status: AC
  Filled 2022-06-04: qty 288

## 2022-06-04 MED ORDER — PROPOFOL 1000 MG/100ML IV EMUL
5.0000 ug/kg/min | INTRAVENOUS | Status: DC
  Administered 2022-06-04: 15 ug/kg/min via INTRAVENOUS

## 2022-06-04 MED ORDER — CHLORHEXIDINE GLUCONATE CLOTH 2 % EX PADS
6.0000 | MEDICATED_PAD | Freq: Every day | CUTANEOUS | Status: DC
  Administered 2022-06-04 – 2022-06-24 (×21): 6 via TOPICAL

## 2022-06-04 MED ORDER — PROPOFOL 1000 MG/100ML IV EMUL
5.0000 ug/kg/min | INTRAVENOUS | Status: DC
  Administered 2022-06-04: 20 ug/kg/min via INTRAVENOUS
  Administered 2022-06-05: 15 ug/kg/min via INTRAVENOUS
  Filled 2022-06-04 (×2): qty 100

## 2022-06-04 MED ORDER — LACTATED RINGERS IV SOLN
INTRAVENOUS | Status: AC
Start: 2022-06-04 — End: 2022-06-05

## 2022-06-04 MED ORDER — MAGNESIUM SULFATE 2 GM/50ML IV SOLN
2.0000 g | Freq: Once | INTRAVENOUS | Status: AC
Start: 2022-06-04 — End: 2022-06-04
  Administered 2022-06-04: 2 g via INTRAVENOUS
  Filled 2022-06-04: qty 50

## 2022-06-04 MED ORDER — ETOMIDATE 2 MG/ML IV SOLN
INTRAVENOUS | Status: DC | PRN
  Administered 2022-06-04: 20 mg via INTRAVENOUS

## 2022-06-04 MED ORDER — SODIUM CHLORIDE 0.9 % IV SOLN: INTRAVENOUS | Status: DC | PRN

## 2022-06-04 MED ORDER — PANTOPRAZOLE SODIUM 40 MG IV SOLR
40.0000 mg | Freq: Two times a day (BID) | INTRAVENOUS | Status: DC
  Administered 2022-06-04 – 2022-06-24 (×41): 40 mg via INTRAVENOUS
  Filled 2022-06-04 (×42): qty 10

## 2022-06-04 MED ORDER — 0.9 % SODIUM CHLORIDE (POUR BTL) OPTIME
TOPICAL | Status: DC | PRN
  Administered 2022-06-04 (×2): 1000 mL

## 2022-06-04 MED ORDER — HEPARIN SODIUM (PORCINE) 5000 UNIT/ML IJ SOLN
5000.0000 [IU] | Freq: Three times a day (TID) | INTRAMUSCULAR | Status: DC
  Administered 2022-06-04 – 2022-06-19 (×45): 5000 [IU] via SUBCUTANEOUS
  Filled 2022-06-04 (×43): qty 1

## 2022-06-04 MED ORDER — DIPHENHYDRAMINE HCL 12.5 MG/5ML PO ELIX: 12.5000 mg | ORAL_SOLUTION | Freq: Four times a day (QID) | ORAL | Status: DC | PRN

## 2022-06-04 MED ORDER — MIDAZOLAM HCL 2 MG/2ML IJ SOLN
INTRAMUSCULAR | Status: AC
  Filled 2022-06-04: qty 2

## 2022-06-04 MED ORDER — ORAL CARE MOUTH RINSE: 15.0000 mL | OROMUCOSAL | Status: DC | PRN

## 2022-06-04 MED ORDER — MORPHINE SULFATE (PF) 2 MG/ML IV SOLN
2.0000 mg | INTRAVENOUS | Status: DC | PRN
  Administered 2022-06-04 – 2022-06-07 (×10): 2 mg via INTRAVENOUS
  Filled 2022-06-04 (×9): qty 1

## 2022-06-04 MED ORDER — FLUCONAZOLE IN SODIUM CHLORIDE 200-0.9 MG/100ML-% IV SOLN
200.0000 mg | INTRAVENOUS | Status: DC
  Filled 2022-06-04: qty 100

## 2022-06-04 MED ORDER — ORAL CARE MOUTH RINSE
15.0000 mL | OROMUCOSAL | Status: DC
  Administered 2022-06-04 – 2022-06-05 (×14): 15 mL via OROMUCOSAL

## 2022-06-04 MED ORDER — ROCURONIUM BROMIDE 100 MG/10ML IV SOLN
INTRAVENOUS | Status: DC | PRN
  Administered 2022-06-04 (×2): 50 mg via INTRAVENOUS

## 2022-06-04 SURGICAL SUPPLY — 44 items
APL PRP STRL LF DISP 70% ISPRP (MISCELLANEOUS) ×1
CHLORAPREP W/TINT 26 (MISCELLANEOUS) ×3 IMPLANT
CLOTH BEACON ORANGE TIMEOUT ST (SAFETY) ×3 IMPLANT
COVER LIGHT HANDLE STERIS (MISCELLANEOUS) ×6 IMPLANT
DRAPE WARM FLUID 44X44 (DRAPES) ×3 IMPLANT
DRSG OPSITE POSTOP 4X8 (GAUZE/BANDAGES/DRESSINGS) ×1 IMPLANT
ELECT REM PT RETURN 9FT ADLT (ELECTROSURGICAL) ×2
ELECTRODE REM PT RTRN 9FT ADLT (ELECTROSURGICAL) ×2 IMPLANT
EVACUATOR DRAINAGE 10X20 100CC (DRAIN) IMPLANT
EVACUATOR SILICONE 100CC (DRAIN) ×2
GLOVE BIO SURGEON STRL SZ 6.5 (GLOVE) ×3 IMPLANT
GLOVE BIOGEL PI IND STRL 6.5 (GLOVE) ×2 IMPLANT
GLOVE BIOGEL PI IND STRL 7.0 (GLOVE) ×4 IMPLANT
GLOVE BIOGEL PI INDICATOR 6.5 (GLOVE) ×1
GLOVE BIOGEL PI INDICATOR 7.0 (GLOVE) ×2
GOWN STRL REUS W/TWL LRG LVL3 (GOWN DISPOSABLE) ×9 IMPLANT
HANDLE SUCTION POOLE (INSTRUMENTS) ×2 IMPLANT
INST SET MAJOR GENERAL (KITS) ×3 IMPLANT
KIT TURNOVER KIT A (KITS) ×3 IMPLANT
LIGASURE IMPACT 36 18CM CVD LR (INSTRUMENTS) ×1 IMPLANT
MANIFOLD NEPTUNE II (INSTRUMENTS) ×3 IMPLANT
NDL HYPO 18GX1.5 BLUNT FILL (NEEDLE) ×2 IMPLANT
NDL HYPO 21X1.5 SAFETY (NEEDLE) ×2 IMPLANT
NEEDLE HYPO 18GX1.5 BLUNT FILL (NEEDLE) ×2 IMPLANT
NEEDLE HYPO 21X1.5 SAFETY (NEEDLE) ×2 IMPLANT
NS IRRIG 1000ML POUR BTL (IV SOLUTION) ×8 IMPLANT
PACK MAJOR ABDOMINAL (CUSTOM PROCEDURE TRAY) ×3 IMPLANT
PAD ARMBOARD 7.5X6 YLW CONV (MISCELLANEOUS) ×3 IMPLANT
PENCIL SMOKE EVACUATOR COATED (MISCELLANEOUS) ×3 IMPLANT
RELOAD LINEAR CUT PROX 55 BLUE (ENDOMECHANICALS) ×2 IMPLANT
RELOAD STAPLE 55 3.8 BLU REG (ENDOMECHANICALS) IMPLANT
RETRACTOR WOUND ALXS 18CM MED (MISCELLANEOUS) IMPLANT
RTRCTR WOUND ALEXIS O 18CM MED (MISCELLANEOUS) ×2
SET BASIN LINEN APH (SET/KITS/TRAYS/PACK) ×3 IMPLANT
SPONGE DRAIN TRACH 4X4 STRL 2S (GAUZE/BANDAGES/DRESSINGS) ×1 IMPLANT
SPONGE T-LAP 18X18 ~~LOC~~+RFID (SPONGE) ×3 IMPLANT
STAPLER PROXIMATE 55 BLUE (STAPLE) ×1 IMPLANT
STAPLER VISISTAT (STAPLE) ×3 IMPLANT
SUCTION POOLE HANDLE (INSTRUMENTS) ×2
SUT ETHILON 3 0 FSL (SUTURE) ×1 IMPLANT
SUT PDS AB CT VIOLET #0 27IN (SUTURE) ×6 IMPLANT
SUT SILK 3 0 SH CR/8 (SUTURE) ×4 IMPLANT
SYR 20ML LL LF (SYRINGE) ×6 IMPLANT
TRAY FOLEY MTR SLVR 16FR STAT (SET/KITS/TRAYS/PACK) ×3 IMPLANT

## 2022-06-04 NOTE — Op Note (Signed)
[facility] Operative Note  06/04/22  Preoperative Diagnosis: Gastric perforation, pneumoperitoneum    Postoperative Diagnosis: Gastric perforation, pneumoperitoneum, purulent ascites    Procedure(s) Performed: Exploratory laparotomy, gastrorrhaphy, omental patch, central line placement (left IJ)    Surgeon: Lanell Matar. Constance Haw, MD   Assistants: No qualified resident was available    Anesthesia: General endotracheal   Anesthesiologist: Louann Sjogren, MD    Specimens:  Cultures    Estimated Blood Loss: Minimal   Blood Replacement: None    Complications: None   Wound Class: Dirty/ infected    Operative Indications:  Carolyn Erickson is a 76 yo with pneumoperitoneum and gastric perforation on CT scan. We discussed repair and potential for septic shock and need for central line and arterial line. We discussed risk of bleeding, infection, drain, needing to stay intubated, rehab after surgery, and not eating for at least 5 days after surgery, TPN for nutrition.   Findings: 1.5cm anterior gastric perforation with thickened rind, purulent ascites    Procedure: The patient was taken to the operating room and placed supine. General endotracheal anesthesia was induced. Intravenous antibiotics were administered per protocol.    The left chest and neck was prepped and draped in the usual sterile fashion.  Wearing full gown and gloves, I performed the procedure.  One percent lidocaine was used for local anesthesia. An ultrasound was utilized to assess the jugular vein.  The needle with syringe was advanced into the vein with dark venous return, and a wire was placed using the Seldinger technique without difficulty.  Ectopia was not noted.  The skin was knicked and a dilator was placed, and the three lumen catheter was placed over the wire with continued control of the wire.  There was good draw back of blood from all three lumens and each flushed easily with saline.  The catheter  was secured in 4 points with 2-0 silk and a biopatch and dressing was placed.     A foley was placed. An arterial line was placed. An nasogastric tube positioned to decompress the stomach. The abdomen was prepared and draped in the usual sterile fashion.   An upper midline incision was made and carried down to the fascia. The fascia was entered with care with scissors. Upon entering the abdomen (organ space), I encountered purulent ascites.  There was food particles and purulent ascites. Cultures were obtained. A 1.5cm ulcer was noted anteriorly with some thickened rind and omentum.  The ulcer was oversewn with full thickness bites with 3-0 Silk suture to re-approxiate the edges. Omentum that was adherent and unable to be dissected off the area was left in the space of the repair to aid in buttressing. A large omental tongue was created from the transverse colon with a ligasure and swung upwards. Given the size of the hole, I secured the omental patch in a circular pattern around the perforation with overlap of the omentum beyond the perforation.   The abdomen was copiously irrigated with saline. The NG was palpated in the greater curve of the stomach and secured. A JP drain was placed over the repair and brought out the RUQ and secured with a 3-0 Nylon. The fascia was closed in the standard fashion with 0 PDS suture. The skin was irrigated and closed with staples. A honeycomb dressing was placed.   Final inspection revealed acceptable hemostasis. All counts were correct at the end of the case. The patient was left intubated and was on levophed at 6 and  was taken upstairs to the ICU from the operating room.    Curlene Labrum, MD [facility] [address] [place], [place] [address] [phone] (office)

## 2022-06-04 NOTE — Assessment & Plan Note (Addendum)
Repleted -Continue to closely follow drain and further repleted/supplement through TPN.

## 2022-06-04 NOTE — Consult Note (Signed)
Initial Consultation Note   Patient: Carolyn Erickson DOB: [birth year] PCP: Worthy Rancher, MD DOA: 06/03/2022 DOS: the patient was seen and examined on 06/04/2022 Primary service: Virl Cagey, MD  Referring physician: Dr. Constance Haw Reason for consult: Postop management  Assessment/Plan: Gastric perforation/pneumoperitoneum/purulent ascites s/p exploratory laparotomy, gastrorrhaphy and omental patch-06/04/2022 (POD 0) Severe sepsis secondary to above Lactic acidosis due to sepsis Hyperbilirubinemia Abdominal pain and vomiting Acute kidney injury Essential hypertension Mixed hyperlipidemia GERD  Assessment and Plan: Gastric perforation/pneumoperitoneum/purulent ascites s/p exploratory laparotomy, gastrorrhaphy and omental patch-06/04/2022 (POD 0) Septic shock secondary to above Lactic acidosis due to septic shock Patient meets sepsis criteria due to being tachypneic, tachycardic and having GI as source of infection (met sepsis criteria). Lactic acid was elevated (5.9 > 6.8 > 6.3) and patient requires pressors to maintain normal blood pressure.  Meeting criteria for septic shock Patient is currently intubated and currently not requiring any sedative drug.  She was requiring 6 mics of Levophed for pressure support. Continue IV Zosyn and Diflucan Continue IV morphine 2 mg every 3 hours as needed for pain Continue Protonix Continue Zofran as needed Continue IV hydration Continue to trend lactic acid Blood culture and urine culture pending  Hyperbilirubinemia Total bilirubin- 1.8; direct bilirubin will be checked  Abdominal pain and vomiting This may be due to gastric ulcer/perforation Continue IV morphine as indicated above Continue Zofran as needed  Acute kidney injury BUN/creatinine 36/2.14 (baseline creatinine at 1.0-1.1) Continue IV hydration Renally adjust medications, avoid nephrotoxic agents/dehydration/hypotension  Essential hypertension Patient  currently requiring Levophed to maintain adequate blood pressure  Mixed hyperlipidemia Continue home meds when patient gets extubated  GERD Continue Protonix  TRH will continue to follow the patient.  HPI: Carolyn Erickson is a 76 y.o. female with past medical history of hypertension, hyperlipidemia, GERD who presents to the emergency department via private vehicle with complaint of 1 day of severe weakness.  Patient has about 82-monthhistory of abdominal pain, vomiting and decreased oral intake.  Symptoms worsened within last few days and had difficulty in being able to stand up today (usually able to ambulate within the house without any assistive device), abdominal pain was 10/10 on pain.  In the emergency department, she was noted to be lethargic, tachypneic, hypotensive with a BP of 71/54 (MAP 60), O2 sat 97% and temperature 98.21F.  Work-up in the ED showed normocytic anemia, normal BMP except for BUN/creatinine 36/2.14 (baseline creatinine at 1.0-1.1).  Lactic acid 5.9 > 6.8, lipase 24. CT abdomen pelvis without contrast showed changes consistent with a perforated gastric ulcer anteriorly with considerable spillage of fluid and air into the abdominal cavity. Chest x-ray showed Air seen just below the right hemidiaphragm which may represent an air filled loop of colon IV hydration was provided, Protonix was given.  IV cefepime 2 g x 1 was given.  General surgery was consulted and patient was taken to the OR for surgical repair.  Review of Systems: As mentioned in the history of present illness. All other systems reviewed and are negative. Past Medical History:  Diagnosis Date   Cancer ([facility] 2001   breast left   Depression    Hypertension    Past Surgical History:  Procedure Laterality Date   ABDOMINAL HYSTERECTOMY     BACK SURGERY     Social History:  reports that she has never smoked. She has never used smokeless tobacco. She reports that she does not drink alcohol and does not  use drugs.  No Known Allergies  Family History  Problem Relation Age of Onset   Diabetes Mother    Heart disease Mother    Alzheimer's disease Father    Parkinson's disease Sister    Alzheimer's disease Brother     Prior to Admission medications   Medication Sig Start Date End Date Taking? Authorizing Provider  amLODipine (NORVASC) 2.5 MG tablet TAKE 1 TABLET EVERY DAY 05/25/22   Dettinger, Fransisca Kaufmann, MD  atorvastatin (LIPITOR) 20 MG tablet Take 1 tablet (20 mg total) by mouth daily. 01/30/22   Dettinger, Fransisca Kaufmann, MD  buPROPion (WELLBUTRIN SR) 150 MG 12 hr tablet Take 1 tablet (150 mg total) by mouth 2 (two) times daily. 01/30/22   Dettinger, Fransisca Kaufmann, MD  cholecalciferol (VITAMIN D3) 25 MCG (1000 UNIT) tablet Take 1,000 Units by mouth daily.    [provider]  cyclobenzaprine (FLEXERIL) 10 MG tablet Take 1 tablet (10 mg total) by mouth at bedtime as needed for muscle spasms (neck pain). 01/30/22   Dettinger, Fransisca Kaufmann, MD  donepezil (ARICEPT) 10 MG tablet Take one tablet 10 mg daily 04/13/22   Rondel Jumbo, PA-C  meloxicam (MOBIC) 7.5 MG tablet TAKE 1 TABLET EVERY DAY 10/05/21   Dettinger, Fransisca Kaufmann, MD  pantoprazole (PROTONIX) 40 MG tablet Take 1 tablet (40 mg total) by mouth 2 (two) times daily before a meal. 06/02/22   Dettinger, Fransisca Kaufmann, MD  venlafaxine (EFFEXOR) 37.5 MG tablet Take 1 tablet (37.5 mg total) by mouth 2 (two) times daily. 01/30/22   Dettinger, Fransisca Kaufmann, MD    Physical Exam: Vitals:   06/04/22 0000 06/04/22 0015 06/04/22 0136 06/04/22 0320  BP: (!) 96/56 (!) 97/58  (!) 93/45  Pulse: 97 97  (!) 108  Resp: (!) 29 (!) 27    Temp:    (!) 96.4 F (35.8 C)  TempSrc:    Axillary  SpO2: 92% 92%  99%  Weight:    50.9 kg  Height:   _0  (1.549 m) _1  (1.549 m)   General: Elderly female, intubated and sedated, but not in any acute distress.  HEENT: NCAT.  PERRL Neck: Neck supple without lymphadenopathy. No carotid bruits. No masses palpated.  Cardiovascular:  Tachycardia.  Regular rate with normal S1-S2 sounds. No murmurs, rubs or gallops auscultated. No JVD.  Respiratory: Tachypnea.  Clear breath sounds.  No accessory muscle use. Abdomen: Soft, nondistended.  Decreased bowel sounds.  Skin: No rashes, lesions, or ulcerations.  Dry, warm to touch. Musculoskeletal:  2+ dorsalis pedis and radial pulses. Good ROM.  No contractures  Psychiatric: This cannot be assessed at this time due to patient being intubated and sedated  Neurologic: This cannot be assessed at this time due to patient being intubated and sedated   Data Reviewed:  EKG personally reviewed showed sinus tachycardia at a rate of 170 bpm    Family Communication: None at bedside  Primary team communication: I was already updated regarding patient's condition by Dr. Constance Haw  Thank you very much for involving [place] in the care of your patient.  Author: Bernadette Hoit, DO 06/04/2022 3:25 AM  For on call review [email or web address].

## 2022-06-04 NOTE — Assessment & Plan Note (Addendum)
-  Presented with hypotension, tachycardia, and lactic acid that  peaked at 6.8 -Pt successfully weaned off norepinephrine infusion and with stable vital signs at time of discharge. -Sepsis process secondary to gastric perforation with peritonitis -06/03/22 blood cultures--C. glabrata -UA 11-20 WBC>>urine culture unrevealing -Continue IV antibiotics per ID (currently receiving Unasyn for 5 more days at time of discharge). -Patient has completed treatment for fungemia.

## 2022-06-04 NOTE — Anesthesia Preprocedure Evaluation (Signed)
Anesthesia Evaluation  Patient identified by MRN, date of birth, ID band Patient awake    Reviewed: Allergy & Precautions, H&P , NPO status , Patient's Chart, lab work & pertinent test results, reviewed documented beta blocker date and time   Airway Mallampati: II  TM Distance: >3 FB Neck ROM: full    Dental no notable dental hx.    Pulmonary neg pulmonary ROS,    Pulmonary exam normal breath sounds clear to auscultation       Cardiovascular Exercise Tolerance: Good hypertension, negative cardio ROS   Rhythm:regular Rate:Normal     Neuro/Psych PSYCHIATRIC DISORDERS Depression  Neuromuscular disease    GI/Hepatic Neg liver ROS, PUD,   Endo/Other  negative endocrine ROS  Renal/GU negative Renal ROS  negative genitourinary   Musculoskeletal   Abdominal   Peds  Hematology negative hematology ROS (+)   Anesthesia Other Findings   Reproductive/Obstetrics negative OB ROS                             Anesthesia Physical Anesthesia Plan  ASA: 4 and emergent  Anesthesia Plan: General and General ETT   Post-op Pain Management:    Induction:   PONV Risk Score and Plan: Ondansetron  Airway Management Planned:   Additional Equipment:   Intra-op Plan:   Post-operative Plan:   Informed Consent: I have reviewed the patients History and Physical, chart, labs and discussed the procedure including the risks, benefits and alternatives for the proposed anesthesia with the patient or authorized representative who has indicated his/her understanding and acceptance.     Dental Advisory Given  Plan Discussed with: CRNA  Anesthesia Plan Comments:         Anesthesia Quick Evaluation

## 2022-06-04 NOTE — Progress Notes (Signed)
PHARMACY - TOTAL PARENTERAL NUTRITION CONSULT NOTE   Indication:  gastric perforation   Patient Measurements: Height: '5\' 1"'$  (154.9 cm) Weight: 50.9 kg (112 lb 3.4 oz) IBW/kg (Calculated) : 47.8 TPN AdjBW (KG): 50.9 Body mass index is 21.2 kg/m. Usual Weight:   Assessment:  Pharmacy consulted to dose TPN in patient with gastric perforation.  Glucose / Insulin: WNL Electrolytes: mag 1.3 Renal: Scr 2.10 > 1.64 Hepatic: Albumin 3.2  Tbili 1.8 Intake / Output; MIVF: LR @ 100 mL/hr Propofol @ 6.11 mL/hr currently  GI Surgeries / Procedures:  Exploratory laparotomy, gastrorrhaphy  Central access: CVC triple lumen 7/2 TPN start date: 7/2  Nutritional Goals: Pending RD consult  RD Assessment:  pending  Current Nutrition:  NPO Propofol @ 6.11 mL/hr currently = 161.3 kcal/24 hours  Plan:  Magnesium sulfate 2 grams IV x 1 dose  Start TPN at 40 mL/hr at 1800 Withholding lipids today- patient on propofol and will consider addition tomorrow. Electrolytes in TPN: Na 78mq/L, K 557m/L, Ca 80m59mL, Mg 80mE2m, and Phos 180mm19m. Cl:Ac 1:1 Add standard MVI and trace elements to TPN Initiate Sensitive q8h SSI and adjust as needed  Reduce MIVF to 60 mL/hr at 1800 Monitor TPN labs on Mon/Thurs,   SteveMargot AblesrmD Clinical Pharmacist 06/04/2022 9:01 AM

## 2022-06-04 NOTE — Assessment & Plan Note (Addendum)
-  Resumed amlodipine 2.5 mg daily -Follow-up vital signs.

## 2022-06-04 NOTE — Progress Notes (Signed)
[facility]  Ex lap, gastrography, omental patch. Patient remaining intubated. Central line and arterial line in place.  Updated Daughter Karlene Einstein. Husband's phone not working.  Hospitalist helping with management  PRN morphine for pain, propofol Vent management, ABG in AM, extubate once appropriate CXR ordered Levophed @ 6 in OR, CVL in place  NG NPO Will get UGI in 5 days  TPN For tomorrow  Pharmacy for zosyn and diflucan for 5 days post op Labs in AM LR @ 100 Foley, urine sent on placement due to looking cloudy  SCDs, heparin sq  Updated Dr. Edwinna Areola and RN.

## 2022-06-04 NOTE — Anesthesia Procedure Notes (Signed)
Arterial Line Insertion Start/End7/01/2022 1:01 AM, 06/04/2022 1:04 AM Performed by: Louann Sjogren, MD, anesthesiologist  Patient location: OR. Preanesthetic checklist: patient identified, IV checked, site marked, risks and benefits discussed, surgical consent, monitors and equipment checked, pre-op evaluation, timeout performed and anesthesia consent Lidocaine 1% used for infiltration Left, radial was placed Catheter size: 20 G Hand hygiene performed  and maximum sterile barriers used   Attempts: 1 Procedure performed without using ultrasound guided technique. Following insertion, dressing applied. Post procedure assessment: normal and unchanged  Patient tolerated the procedure well with no immediate complications.

## 2022-06-04 NOTE — Assessment & Plan Note (Addendum)
-  Serum creatinine peaked at 2.28 but trended down since then. -Secondary to hemodynamic changes/sepsis and hypovolemia -Currently stable per GFR essentially back to baseline. -Continue minimizing the use of nephrotoxic agents, avoid hypotension, adjust medication dosages to appropriate renal function and maintain adequate hydration. -Creatinine at discharge 1.2 -Continue to follow renal function trend/stability.

## 2022-06-04 NOTE — Assessment & Plan Note (Addendum)
-  Patient had MoCa 17/30 on 03/14/22 -will continue home dose donezepil, venlafaxine and bupropion

## 2022-06-04 NOTE — Progress Notes (Signed)
[facility] Progress Note  Day of Surgery  Subjective: Intubated sedated. Family at bedside. UOP picking up. On levo but weaning.   Objective: Vital signs in last 24 hours: Temp:  [96.4 F (35.8 C)-99.5 F (37.5 C)] 99.3 F (37.4 C) (07/02 1123) Pulse Rate:  [95-125] 101 (07/02 1300) Resp:  [20-32] 25 (07/02 1300) BP: (68-131)/(45-68) 90/48 (07/02 1300) SpO2:  [91 %-100 %] 98 % (07/02 1300) Arterial Line BP: (97-151)/(47-66) 103/56 (07/02 0800) FiO2 (%):  [40 %] 40 % (07/02 1600) Weight:  [50.9 kg] 50.9 kg (07/02 0500)    Intake/Output from previous day: 07/01 0701 - 07/02 0700 In: 5666.1 [I.V.:2374.5; IV Piggyback:3291.6] Out: 350 [Urine:300; Blood:50] Intake/Output this shift: Total I/O In: 239.5 [I.V.:140.9; IV Piggyback:98.6] Out: -   General appearance: intubated sedated  Resp: Vented  GI: soft, Dressing in place, JP with SS fluid   Lab Results:  Recent Labs    06/03/22 2045 06/03/22 2153 06/04/22 0444  WBC 4.1  --  4.8  HGB 14.6 13.6 12.4  HCT 46.4* 40.0 38.4  PLT 308  --  249   BMET Recent Labs    06/03/22 2045 06/03/22 2153 06/04/22 0444  NA 137 138 137  K 4.1 3.9 4.0  CL 100 101 108  CO2 22  --  23  GLUCOSE 130* 117* 124*  BUN 36* 33* 36*  CREATININE 2.14* 2.10* 1.64*  CALCIUM 9.6  --  8.3*   PT/INR Recent Labs    06/03/22 2045  LABPROT 15.8*  INR 1.3*    Studies/Results: DG Chest Port 1 View  Result Date: 06/04/2022 CLINICAL DATA:  Endotracheal tube, nasogastric tube and central line placement. EXAM: PORTABLE CHEST 1 VIEW COMPARISON:  June 03, 2022 FINDINGS: Since the prior study there has been interval placement of an endotracheal tube. Its distal tip is seen approximately 3.8 cm from the carina. Interval nasogastric tube placement is also seen. Its distal and extends into the gastric lumen. There is a left internal jugular venous catheter with its distal tip overlying the distal superior vena cava. This is  approximately 6 mm proximal to the junction of the superior vena cava and right atrium. The heart size and mediastinal contours are within normal limits. Mild to moderate severity atelectasis and/or infiltrate is seen within the left lung base. This represents a new finding when compared to the prior study. There is no evidence of a pleural effusion or pneumothorax. Radiopaque surgical clips are seen along the left axilla. The air seen below the right hemidiaphragm on the prior study is no longer visualized. The visualized skeletal structures are unremarkable. IMPRESSION: 1. Interval endotracheal tube, nasogastric tube and left internal jugular venous catheter placement positioning, as described above. 2. Mild to moderate severity left basilar atelectasis and/or infiltrate. Electronically Signed   By: Virgina Norfolk M.D.   On: 06/04/2022 02:47   CT Abdomen Pelvis Wo Contrast  Result Date: 06/03/2022 CLINICAL DATA:  Severe weakness for 2 days with acute abdominal pain, initial encounter EXAM: CT ABDOMEN AND PELVIS WITHOUT CONTRAST TECHNIQUE: Multidetector CT imaging of the abdomen and pelvis was performed following the standard protocol without IV contrast. RADIATION DOSE REDUCTION: This exam was performed according to the departmental dose-optimization program which includes automated exposure control, adjustment of the mA and/or kV according to patient size and/or use of iterative reconstruction technique. COMPARISON:  None Available. FINDINGS: Lower chest: Basilar atelectatic changes are noted. Hepatobiliary: Liver is within normal limits. Gallbladder demonstrates a normal appearance. Pancreas: Unremarkable.  No pancreatic ductal dilatation or surrounding inflammatory changes. Spleen: Normal in size without focal abnormality. Adrenals/Urinary Tract: Adrenal glands are within normal limits. Kidneys demonstrate tiny nonobstructing stones on right in the lower pole. No ureteral obstruction is seen. The bladder  is decompressed. Stomach/Bowel: Considerable fecal material is noted within the rectal vault which may represent a focal impaction. Diverticular change of the colon is noted as well as some generalized wall thickening throughout the colon. The appendix is within normal limits. Small bowel is within normal limits. Stomach demonstrates a defect in the anterior gastric wall consistent with a perforated ulcer. There is a considerable amount of air and fluid throughout the abdomen with apparent direct communication with the gastric best seen on image number 36 of series 2 and image number 78 of series 6. Vascular/Lymphatic: Aortic atherosclerosis. No enlarged abdominal or pelvic lymph nodes. Reproductive: Status post hysterectomy. No adnexal masses. Other: Free air and free fluid similar to that described above throughout the abdomen. The bowel demonstrates some reactive wall thickening related to the fluid and air. Musculoskeletal: Degenerative changes of the lumbar spine are seen. No other bony abnormality is noted. IMPRESSION: Changes consistent with a perforated gastric ulcer anteriorly with considerable spillage of fluid and air into the abdominal cavity. Some reactive wall thickening is noted within the bowel loops secondary to these changes. Critical Value/emergent results were called by telephone at the time of interpretation on 06/03/2022 at 10:43 pm to Dr. Aletta Edouard , who verbally acknowledged these results. Electronically Signed   By: Inez Catalina M.D.   On: 06/03/2022 22:45   DG Chest Port 1 View  Result Date: 06/03/2022 CLINICAL DATA:  Weakness. EXAM: PORTABLE CHEST 1 VIEW COMPARISON:  None Available. FINDINGS: Multiple overlying radiopaque cardiac lead wires are seen. The heart size and mediastinal contours are within normal limits. Mild, diffuse, chronic appearing increased lung markings are seen. There is no evidence of an acute infiltrate, pleural effusion or pneumothorax. Radiopaque surgical clips  are seen along the lateral aspect of the left chest wall. A crescentic area of air is seen just below the right hemidiaphragm. Degenerative changes seen throughout the thoracic spine. IMPRESSION: 1. Chronic appearing increased lung markings without evidence of acute or active cardiopulmonary disease. 2. Air seen just below the right hemidiaphragm which may represent an air filled loop of colon. Further evaluation with abdomen pelvis CT is recommended, as the presence of intra-abdominal free air cannot be excluded. Electronically Signed   By: Virgina Norfolk M.D.   On: 06/03/2022 21:22    Anti-infectives: Anti-infectives (From admission, onward)    Start     Dose/Rate Route Frequency Ordered Stop   06/04/22 0600  piperacillin-tazobactam (ZOSYN) IVPB 3.375 g        3.375 g 12.5 mL/hr over 240 Minutes Intravenous Every 8 hours 06/04/22 0219 06/09/22 0559   06/04/22 0315  fluconazole (DIFLUCAN) IVPB 200 mg  Status:  Discontinued        200 mg 100 mL/hr over 60 Minutes Intravenous Every 24 hours 06/04/22 0217 06/04/22 0219   06/04/22 0315  fluconazole (DIFLUCAN) IVPB 200 mg        200 mg 100 mL/hr over 60 Minutes Intravenous Every 24 hours 06/04/22 0219 06/09/22 0314   06/03/22 2130  ceFEPIme (MAXIPIME) 2 g in sodium chloride 0.9 % 100 mL IVPB        2 g 200 mL/hr over 30 Minutes Intravenous  Once 06/03/22 2127 06/03/22 2209   06/03/22 2130  metroNIDAZOLE (FLAGYL)  IVPB 500 mg  Status:  Discontinued        500 mg 100 mL/hr over 60 Minutes Intravenous Every 12 hours 06/03/22 2127 06/04/22 0217       Assessment/Plan: Patient with gastric ulcer perforation s/p Ex lap, gastrorrhaphy and omental patch. Doing well.  PRN for pain Propofol Vent, weaning, extubate once appropriate Pulmonary consulted ABG in AM and CXR Levophed weaning  NPO, NG in place TPN  PPI BID Zosyn and diflucan JP drain  Labs tomorrow SCDs, heparin sq    LOS: 0 days    Virl Cagey 06/04/2022

## 2022-06-04 NOTE — Anesthesia Postprocedure Evaluation (Signed)
Anesthesia Post Note  Patient: Carolyn Erickson  Procedure(s) Performed: EXPLORATORY LAPAROTOMY, omental patch  Patient location during evaluation: ICU Anesthesia Type: General Level of consciousness: patient remains intubated per anesthesia plan Pain management: pain level controlled Vital Signs Assessment: post-procedure vital signs reviewed and stable Respiratory status: patient on ventilator - see flowsheet for VS Cardiovascular status: blood pressure returned to baseline and stable Postop Assessment: no apparent nausea or vomiting Anesthetic complications: no   No notable events documented.   Last Vitals:  Vitals:   06/04/22 0000 06/04/22 0015  BP: (!) 96/56 (!) 97/58  Pulse: 97 97  Resp: (!) 29 (!) 27  Temp:    SpO2: 92% 92%    Last Pain:  Vitals:   06/03/22 2027  TempSrc:   PainSc: 10-Worst pain ever                 Louann Sjogren

## 2022-06-04 NOTE — Anesthesia Procedure Notes (Signed)
Procedure Name: Intubation Date/Time: 06/04/2022 12:44 AM  Performed by: Louann Sjogren, MDPre-anesthesia Checklist: Patient identified, Emergency Drugs available, Suction available and Patient being monitored Patient Re-evaluated:Patient Re-evaluated prior to induction Oxygen Delivery Method: Circle system utilized Preoxygenation: Pre-oxygenation with 100% oxygen Induction Type: IV induction, Rapid sequence and Cricoid Pressure applied Ventilation: Mask ventilation without difficulty Laryngoscope Size: Mac and 3 Tube type: Oral Tube size: 7.5 mm Number of attempts: 1 Airway Equipment and Method: Stylet and Oral airway Placement Confirmation: ETT inserted through vocal cords under direct vision, positive ETCO2 and breath sounds checked- equal and bilateral Tube secured with: Tape Dental Injury: Teeth and Oropharynx as per pre-operative assessment

## 2022-06-04 NOTE — Assessment & Plan Note (Addendum)
-   Continue PPI twice a day. 

## 2022-06-04 NOTE — Assessment & Plan Note (Addendum)
After surgical intervention patient requires to maintain ventilatory support (PRVC, RR 20, Vt 380, FiO2 0.4) -Successfully extubated on 7/3 and currently not requiring any oxygen supplementation. - continue supportive care.

## 2022-06-04 NOTE — Hospital Course (Addendum)
76 year old female with history of major neurocognitive disorder, hypertension, hyperlipidemia, depression, left breast cancer presenting with generalized weakness and abdominal pain.  Apparently, the patient has been having abdominal pain since April 2023.  The patient had been started on pantoprazole at that time.  There was no improvement.  She followed up with her PCP on 06/02/2022.  Referral was given to see gastroenterology.  Abdominal pain had worsened over the past 2 days prior to admission with worsening generalized weakness.  There is no fevers, chills, chest pain, shortness breath, vomiting, diarrhea.  There is no hematochezia or melena noted.  In the emergency department, the patient was hypotensive with a blood pressure of 71/54.  She was somnolent.  CT of the abdomen and pelvis was performed and showed changes consistent with a perforated gastric ulcer.  Dr. Constance Haw was consulted, and the patient was taken to the OR for exploratory laparotomy, gastrorrhaphy, omental patch.  A left IJ central line was also placed.  TRH is consulted for medical management  In the ED, the patient had low-grade fever 99.5 F.  She was tachycardic and hypotensive with blood pressure 68/53.  WBC 4.1, hemoglobin 14.6, platelets 308,000.  BMP showed sodium 137, potassium 4.1, bicarbonate 22, serum creatinine 2.14.  Postoperatively, the patient was started on Zosyn and fluconazole.  She remained on the ventilator.  Preoperative chest x-ray showed bibasilar atelectasis.  There was free air under the diaphragm.

## 2022-06-04 NOTE — Progress Notes (Addendum)
PROGRESS NOTE  Carolyn Erickson YFV:[id number] DOB: Jul 26, 1946 DOA: 06/03/2022 PCP: Dettinger, Fransisca Kaufmann, MD  Brief History:  76 year old female with history of major neurocognitive disorder, hypertension, hyperlipidemia, depression, left breast cancer presenting with generalized weakness and abdominal pain.  Apparently, the patient has been having abdominal pain since April 2023.  The patient had been started on pantoprazole at that time.  There was no improvement.  She followed up with her PCP on 06/02/2022.  Referral was given to see gastroenterology.  Abdominal pain had worsened over the past 2 days prior to admission with worsening generalized weakness.  There is no fevers, chills, chest pain, shortness breath, vomiting, diarrhea.  There is no hematochezia or melena noted. In the emergency department, the patient was hypotensive with a blood pressure of 71/54.  She was somnolent.  CT of the abdomen and pelvis was performed and showed changes consistent with a perforated gastric ulcer.  Dr. Constance Haw was consulted, and the patient was taken to the OR for exploratory laparotomy, gastrorrhaphy, omental patch.  A left IJ central line was also placed.  TRH is consulted for medical management In the ED, the patient had low-grade fever 99.5 F.  She was tachycardic and hypotensive with blood pressure 68/53.  WBC 4.1, hemoglobin 14.6, platelets 308,000.  BMP showed sodium 137, potassium 4.1, bicarbonate 22, serum creatinine 2.14.  Postoperatively, the patient was started on Zosyn and fluconazole.  She remained on the ventilator.  Preoperative chest x-ray showed bibasilar atelectasis.  There was free air under the diaphragm.     Assessment and Plan: * Septic shock ([place]) Presented with hypotension, tachycardia, and lactic acid peaking at 6.8 Currently on Levophed>> wean as tolerated for SBP greater 90 ---currently at 9 mcg/kg/min Secondary to gastric perforation with peritonitis Follow blood  cultures UA 11-20 WBC Continue Zosyn Continue fluconazole  Gastric perforation ([facility]) Suspect this may be due to chronic NSAID use -Continue PPI twice daily -06/04/2022--ex laparotomy with omental patch -Postoperative care per Dr. Constance Haw -Continue TPN for now  Acute renal failure superimposed on stage 3a chronic kidney disease ([place]) Baseline creatinine 1.0-1.1 -Serum creatinine peaking 2.14 -Secondary to hemodynamic changes/sepsis and hypovolemia -Monitor serial BMPs  Hypomagnesemia Repleting  Acute respiratory failure with hypoxia (HCC) Currently PRVC, RR 20, Vt 380, FiO2 0.4 --plan wean sedation and spontaneous breathing trial 7/3 if stable -am CXR -cm ABG  Major neurocognitive disorder [facility]) Patient had MoCa 17/30 on 03/14/22 -Restart Aricept once stable and clinically -At risk for hospital delirium  GERD (gastroesophageal reflux disease) Continue pantoprazole IV twice daily  Essential hypertension Holding amlodipine secondary to hypotension      Family Communication:  no Family at bedside  Consultants:  general surgery  Code Status:  FULL  DVT Prophylaxis:  SCDs   Procedures: As Listed in Progress Note Above  Antibiotics: Zosyn 7/2>> Fluconazole 7/2>>    Total time spent 40 minutes.  Greater than 50% spent face to face counseling and coordinating care.   Subjective: Pt sedated on ventilaor.  ROS unobtainable  Objective: Vitals:   06/04/22 0800 06/04/22 0900 06/04/22 1123 06/04/22 1300  BP: (!) 95/57 (!) 81/51  (!) 90/48  Pulse: (!) 110 (!) 108 (!) 110 (!) 101  Resp: (!) 22 (!) 24 (!) 25 (!) 25  Temp:   99.3 F (37.4 C)   TempSrc:   Axillary   SpO2: 98% 98% 97% 98%  Weight:      Height:  5'  1" (1.549 m)      Intake/Output Summary (Last 24 hours) at 06/04/2022 1324 Last data filed at 06/04/2022 1322 Gross per 24 hour  Intake 5764.62 ml  Output 350 ml  Net 5414.62 ml   Weight change:  Exam:  General:  Pt is sedated on  ventilator HEENT: No icterus, No thrush, No neck mass, [place]/AT Cardiovascular: RRR, S1/S2, no rubs, no gallops Respiratory: bibasilar rales Abdomen: Soft/+BS, non distended, no guarding Extremities: No edema, No lymphangitis, No petechiae, No rashes, no synovitis   Data Reviewed: I have personally reviewed following labs and imaging studies Basic Metabolic Panel: Recent Labs  Lab 06/03/22 2045 06/03/22 2153 06/04/22 0444  NA 137 138 137  K 4.1 3.9 4.0  CL 100 101 108  CO2 22  --  23  GLUCOSE 130* 117* 124*  BUN 36* 33* 36*  CREATININE 2.14* 2.10* 1.64*  CALCIUM 9.6  --  8.3*  MG  --   --  1.3*  PHOS  --   --  3.5   Liver Function Tests: Recent Labs  Lab 06/03/22 2045  AST 27  ALT 25  ALKPHOS 62  BILITOT 1.8*  PROT 6.9  ALBUMIN 3.2*   Recent Labs  Lab 06/03/22 2133  LIPASE 24   No results for input(s): "AMMONIA" in the last 168 hours. Coagulation Profile: Recent Labs  Lab 06/03/22 2045  INR 1.3*   CBC: Recent Labs  Lab 06/03/22 2045 06/03/22 2153 06/04/22 0444  WBC 4.1  --  4.8  NEUTROABS 2.9  --  2.5  HGB 14.6 13.6 12.4  HCT 46.4* 40.0 38.4  MCV 95.3  --  94.1  PLT 308  --  249   Cardiac Enzymes: No results for input(s): "CKTOTAL", "CKMB", "CKMBINDEX", "TROPONINI" in the last 168 hours. BNP: Invalid input(s): "POCBNP" CBG: Recent Labs  Lab 06/04/22 0450 06/04/22 0728 06/04/22 1125  GLUCAP 113* 106* 97   HbA1C: No results for input(s): "HGBA1C" in the last 72 hours. Urine analysis:    Component Value Date/Time   COLORURINE AMBER (A) 06/04/2022 0520   APPEARANCEUR CLOUDY (A) 06/04/2022 0520   APPEARANCEUR Cloudy (A) 04/13/2021 1530   LABSPEC 1.027 06/04/2022 0520   PHURINE 5.0 06/04/2022 0520   GLUCOSEU NEGATIVE 06/04/2022 0520   HGBUR NEGATIVE 06/04/2022 0520   BILIRUBINUR NEGATIVE 06/04/2022 0520   BILIRUBINUR Negative 04/13/2021 1530   KETONESUR NEGATIVE 06/04/2022 0520   PROTEINUR 100 (A) 06/04/2022 0520   NITRITE NEGATIVE  06/04/2022 0520   LEUKOCYTESUR SMALL (A) 06/04/2022 0520   Sepsis Labs: '@LABRCNTIP'$ (procalcitonin:4,lacticidven:4) ) Recent Results (from the past 240 hour(s))  Culture, blood (Routine x 2)     Status: None (Preliminary result)   Collection Time: 06/03/22  8:45 PM   Specimen: Left Antecubital; Blood  Result Value Ref Range Status   Specimen Description LEFT ANTECUBITAL  Final   Special Requests   Final    BOTTLES DRAWN AEROBIC AND ANAEROBIC Blood Culture adequate volume   Culture   Final    NO GROWTH < 12 HOURS Performed at [facility], [address] [place], [place] [address]    Report Status PENDING  Incomplete  Culture, blood (Routine x 2)     Status: None (Preliminary result)   Collection Time: 06/03/22  9:32 PM   Specimen: Left Antecubital; Blood  Result Value Ref Range Status   Specimen Description LEFT ANTECUBITAL  Final   Special Requests   Final    BOTTLES DRAWN AEROBIC AND ANAEROBIC Blood Culture adequate  volume   Culture   Final    NO GROWTH < 12 HOURS Performed at [facility], [address], [place], [place] [address]    Report Status PENDING  Incomplete  MRSA Next Gen by PCR, Nasal     Status: None   Collection Time: 06/04/22  2:18 AM   Specimen: Nasal Mucosa; Nasal Swab  Result Value Ref Range Status   MRSA by PCR Next Gen NOT DETECTED NOT DETECTED Final    Comment: (NOTE) The GeneXpert MRSA Assay (FDA approved for NASAL specimens only), is one component of a comprehensive MRSA colonization surveillance program. It is not intended to diagnose MRSA infection nor to guide or monitor treatment for MRSA infections. Test performance is not FDA approved in patients less than 54 years old. Performed at [facility], [address] [place], [place] [address]      Scheduled Meds:  Chlorhexidine Gluconate Cloth  6 each Topical Daily   heparin  5,000 Units Subcutaneous Q8H   [START ON 06/05/2022] insulin aspart  0-9 Units Subcutaneous Q8H   mouth rinse  15 mL  Mouth Rinse Q2H   pantoprazole (PROTONIX) IV  40 mg Intravenous Q12H   Continuous Infusions:  sodium chloride     fluconazole (DIFLUCAN) IV Stopped (06/04/22 0442)   lactated ringers 100 mL/hr at 06/04/22 1322   lactated ringers     norepinephrine (LEVOPHED) Adult infusion 9 mcg/min (06/04/22 1214)   piperacillin-tazobactam (ZOSYN)  IV 12.5 mL/hr at 06/04/22 0524   propofol (DIPRIVAN) infusion 20 mcg/kg/min (06/04/22 0524)   TPN ADULT (ION)      Procedures/Studies: DG Chest Port 1 View  Result Date: 06/04/2022 CLINICAL DATA:  Endotracheal tube, nasogastric tube and central line placement. EXAM: PORTABLE CHEST 1 VIEW COMPARISON:  June 03, 2022 FINDINGS: Since the prior study there has been interval placement of an endotracheal tube. Its distal tip is seen approximately 3.8 cm from the carina. Interval nasogastric tube placement is also seen. Its distal and extends into the gastric lumen. There is a left internal jugular venous catheter with its distal tip overlying the distal superior vena cava. This is approximately 6 mm proximal to the junction of the superior vena cava and right atrium. The heart size and mediastinal contours are within normal limits. Mild to moderate severity atelectasis and/or infiltrate is seen within the left lung base. This represents a new finding when compared to the prior study. There is no evidence of a pleural effusion or pneumothorax. Radiopaque surgical clips are seen along the left axilla. The air seen below the right hemidiaphragm on the prior study is no longer visualized. The visualized skeletal structures are unremarkable. IMPRESSION: 1. Interval endotracheal tube, nasogastric tube and left internal jugular venous catheter placement positioning, as described above. 2. Mild to moderate severity left basilar atelectasis and/or infiltrate. Electronically Signed   By: Virgina Norfolk M.D.   On: 06/04/2022 02:47   CT Abdomen Pelvis Wo Contrast  Result Date:  06/03/2022 CLINICAL DATA:  Severe weakness for 2 days with acute abdominal pain, initial encounter EXAM: CT ABDOMEN AND PELVIS WITHOUT CONTRAST TECHNIQUE: Multidetector CT imaging of the abdomen and pelvis was performed following the standard protocol without IV contrast. RADIATION DOSE REDUCTION: This exam was performed according to the departmental dose-optimization program which includes automated exposure control, adjustment of the mA and/or kV according to patient size and/or use of iterative reconstruction technique. COMPARISON:  None Available. FINDINGS: Lower chest: Basilar atelectatic changes are noted. Hepatobiliary: Liver is within normal  limits. Gallbladder demonstrates a normal appearance. Pancreas: Unremarkable. No pancreatic ductal dilatation or surrounding inflammatory changes. Spleen: Normal in size without focal abnormality. Adrenals/Urinary Tract: Adrenal glands are within normal limits. Kidneys demonstrate tiny nonobstructing stones on right in the lower pole. No ureteral obstruction is seen. The bladder is decompressed. Stomach/Bowel: Considerable fecal material is noted within the rectal vault which may represent a focal impaction. Diverticular change of the colon is noted as well as some generalized wall thickening throughout the colon. The appendix is within normal limits. Small bowel is within normal limits. Stomach demonstrates a defect in the anterior gastric wall consistent with a perforated ulcer. There is a considerable amount of air and fluid throughout the abdomen with apparent direct communication with the gastric best seen on image number 36 of series 2 and image number 78 of series 6. Vascular/Lymphatic: Aortic atherosclerosis. No enlarged abdominal or pelvic lymph nodes. Reproductive: Status post hysterectomy. No adnexal masses. Other: Free air and free fluid similar to that described above throughout the abdomen. The bowel demonstrates some reactive wall thickening related to the  fluid and air. Musculoskeletal: Degenerative changes of the lumbar spine are seen. No other bony abnormality is noted. IMPRESSION: Changes consistent with a perforated gastric ulcer anteriorly with considerable spillage of fluid and air into the abdominal cavity. Some reactive wall thickening is noted within the bowel loops secondary to these changes. Critical Value/emergent results were called by telephone at the time of interpretation on 06/03/2022 at 10:43 pm to Dr. Aletta Edouard , who verbally acknowledged these results. Electronically Signed   By: Inez Catalina M.D.   On: 06/03/2022 22:45   DG Chest Port 1 View  Result Date: 06/03/2022 CLINICAL DATA:  Weakness. EXAM: PORTABLE CHEST 1 VIEW COMPARISON:  None Available. FINDINGS: Multiple overlying radiopaque cardiac lead wires are seen. The heart size and mediastinal contours are within normal limits. Mild, diffuse, chronic appearing increased lung markings are seen. There is no evidence of an acute infiltrate, pleural effusion or pneumothorax. Radiopaque surgical clips are seen along the lateral aspect of the left chest wall. A crescentic area of air is seen just below the right hemidiaphragm. Degenerative changes seen throughout the thoracic spine. IMPRESSION: 1. Chronic appearing increased lung markings without evidence of acute or active cardiopulmonary disease. 2. Air seen just below the right hemidiaphragm which may represent an air filled loop of colon. Further evaluation with abdomen pelvis CT is recommended, as the presence of intra-abdominal free air cannot be excluded. Electronically Signed   By: Virgina Norfolk M.D.   On: 06/03/2022 21:22    Orson Eva, DO  [facility]  If 7PM-7AM, please contact night-coverage [email or web address] Password [email or web address] 06/04/2022, 1:24 PM   LOS: 0 days

## 2022-06-04 NOTE — Assessment & Plan Note (Addendum)
-  Suspect this may be due to chronic NSAID use -Continue PPI twice daily (currently transition to oral route). -06/04/2022--ex laparotomy with omental patch -Postoperative care per general surgery service (Dr. Constance Haw) -UGI 7/6 - no leak seen.  7/9 - CT c/w leak present - PICC line placed 7/10 and TPN started 7/10 - surgery planning to repeat CT with oral contrast again on 07/03/22 -Continue IV Unasyn for complaining of peritonitis. -Still with ongoing leakage.

## 2022-06-04 NOTE — Progress Notes (Signed)
Pharmacy Antibiotic Note  Carolyn Erickson is a 76 y.o. female admitted on 06/03/2022 with  gastric perforation s/p OR .  Pharmacy has been consulted for Zosyn/Fluconazole dosing for 5 day duration. WBC ok, noted renal dysfunction.   Plan: Zosyn 3.375G IV q8h to be infused over 4 hours x 5 days Fluconazole 200 mg IV q24h x 5 days Trend WBC, temp, renal function  Height: '5\' 1"'$  (154.9 cm) IBW/kg (Calculated) : 47.8  Temp (24hrs), Avg:98.8 F (37.1 C), Min:98.8 F (37.1 C), Max:98.8 F (37.1 C)  Recent Labs  Lab 06/03/22 2045 06/03/22 2145 06/03/22 2153 06/03/22 2318  WBC 4.1  --   --   --   CREATININE 2.14*  --  2.10*  --   LATICACIDVEN 5.9* 6.8*  --  6.3*    CrCl cannot be calculated (Unknown ideal weight.).    No Known Allergies  Narda Bonds, PharmD, BCPS Clinical Pharmacist Phone: [phone]

## 2022-06-04 NOTE — Transfer of Care (Signed)
Immediate Anesthesia Transfer of Care Note  Patient: Carolyn Erickson  Procedure(s) Performed: EXPLORATORY LAPAROTOMY, omental patch  Patient Location: ICU  Anesthesia Type:General  Level of Consciousness: Patient remains intubated per anesthesia plan  Airway & Oxygen Therapy: Patient remains intubated per anesthesia plan and Patient placed on Ventilator (see vital sign flow sheet for setting)  Post-op Assessment: Report given to RN and Post -op Vital signs reviewed and stable  Post vital signs: Reviewed and stable  Last Vitals:  Vitals Value Taken Time  BP    Temp    Pulse    Resp    SpO2      Last Pain:  Vitals:   06/03/22 2027  TempSrc:   PainSc: 10-Worst pain ever         Complications: No notable events documented.

## 2022-06-05 ENCOUNTER — Other Ambulatory Visit: Payer: Self-pay

## 2022-06-05 ENCOUNTER — Inpatient Hospital Stay (HOSPITAL_COMMUNITY): Payer: Medicare PPO

## 2022-06-05 DIAGNOSIS — J9601 Acute respiratory failure with hypoxia: Secondary | ICD-10-CM

## 2022-06-05 DIAGNOSIS — K255 Chronic or unspecified gastric ulcer with perforation: Secondary | ICD-10-CM | POA: Diagnosis not present

## 2022-06-05 DIAGNOSIS — A419 Sepsis, unspecified organism: Secondary | ICD-10-CM | POA: Diagnosis not present

## 2022-06-05 DIAGNOSIS — F0390 Unspecified dementia without behavioral disturbance: Secondary | ICD-10-CM

## 2022-06-05 DIAGNOSIS — N179 Acute kidney failure, unspecified: Secondary | ICD-10-CM | POA: Diagnosis not present

## 2022-06-05 LAB — COMPREHENSIVE METABOLIC PANEL
ALT: 19 U/L (ref 0–44)
AST: 20 U/L (ref 15–41)
Albumin: 1.5 g/dL — ABNORMAL LOW (ref 3.5–5.0)
Alkaline Phosphatase: 46 U/L (ref 38–126)
Anion gap: 6 (ref 5–15)
BUN: 48 mg/dL — ABNORMAL HIGH (ref 8–23)
CO2: 22 mmol/L (ref 22–32)
Calcium: 7.6 mg/dL — ABNORMAL LOW (ref 8.9–10.3)
Chloride: 103 mmol/L (ref 98–111)
Creatinine, Ser: 2.28 mg/dL — ABNORMAL HIGH (ref 0.44–1.00)
GFR, Estimated: 21 mL/min — ABNORMAL LOW (ref >60)
Glucose, Bld: 187 mg/dL — ABNORMAL HIGH (ref 70–99)
Potassium: 4.6 mmol/L (ref 3.5–5.1)
Sodium: 131 mmol/L — ABNORMAL LOW (ref 135–145)
Total Bilirubin: 1.7 mg/dL — ABNORMAL HIGH (ref 0.3–1.2)
Total Protein: 4 g/dL — ABNORMAL LOW (ref 6.5–8.1)

## 2022-06-05 LAB — CBC
HCT: 36.1 % (ref 36.0–46.0)
Hemoglobin: 12 g/dL (ref 12.0–15.0)
MCH: 30.7 pg (ref 26.0–34.0)
MCHC: 33.2 g/dL (ref 30.0–36.0)
MCV: 92.3 fL (ref 80.0–100.0)
Platelets: 175 10*3/uL (ref 150–400)
RBC: 3.91 MIL/uL (ref 3.87–5.11)
RDW: 13.1 % (ref 11.5–15.5)
WBC: 22.2 10*3/uL — ABNORMAL HIGH (ref 4.0–10.5)
nRBC: 0 % (ref 0.0–0.2)

## 2022-06-05 LAB — TRIGLYCERIDES: Triglycerides: 64 mg/dL (ref <150)

## 2022-06-05 LAB — PHOSPHORUS: Phosphorus: 4.5 mg/dL (ref 2.5–4.6)

## 2022-06-05 LAB — PROCALCITONIN: Procalcitonin: 101.76 ng/mL

## 2022-06-05 LAB — URINE CULTURE: Culture: <10000 — AB

## 2022-06-05 LAB — BLOOD GAS, ARTERIAL
Acid-base deficit: 5.1 mmol/L — ABNORMAL HIGH (ref 0.0–2.0)
Bicarbonate: 19.9 mmol/L — ABNORMAL LOW (ref 20.0–28.0)
Collection site: 22223
FIO2: 40 %
O2 Saturation: 99.4 %
Patient temperature: 37
pCO2 arterial: 36 mmHg (ref 32–48)
pH, Arterial: 7.35 (ref 7.35–7.45)
pO2, Arterial: 107 mmHg (ref 83–108)

## 2022-06-05 LAB — LACTIC ACID, PLASMA: Lactic Acid, Venous: 1.3 mmol/L (ref 0.5–1.9)

## 2022-06-05 LAB — GLUCOSE, CAPILLARY
Glucose-Capillary: 178 mg/dL — ABNORMAL HIGH (ref 70–99)
Glucose-Capillary: 141 mg/dL — ABNORMAL HIGH (ref 70–99)
Glucose-Capillary: 142 mg/dL — ABNORMAL HIGH (ref 70–99)

## 2022-06-05 LAB — MAGNESIUM: Magnesium: 2.1 mg/dL (ref 1.7–2.4)

## 2022-06-05 MED ORDER — PIPERACILLIN-TAZOBACTAM 3.375 G IVPB
3.3750 g | Freq: Two times a day (BID) | INTRAVENOUS | Status: AC
  Administered 2022-06-05 – 2022-06-08 (×7): 3.375 g via INTRAVENOUS
  Filled 2022-06-05 (×7): qty 50

## 2022-06-05 MED ORDER — LACTATED RINGERS IV SOLN: INTRAVENOUS | Status: DC

## 2022-06-05 MED ORDER — ORAL CARE MOUTH RINSE
15.0000 mL | OROMUCOSAL | Status: DC
  Administered 2022-06-05 – 2022-06-24 (×88): 15 mL via OROMUCOSAL

## 2022-06-05 MED ORDER — TRAVASOL 10 % IV SOLN
INTRAVENOUS | Status: DC
  Filled 2022-06-05: qty 705.6

## 2022-06-05 NOTE — Progress Notes (Signed)
PHARMACY - TOTAL PARENTERAL NUTRITION CONSULT NOTE   Indication: gastric perforation  Patient Measurements: Height: '5\' 1"'$  (154.9 cm) Weight: 50.9 kg (112 lb 3.4 oz) IBW/kg (Calculated) : 47.8 TPN AdjBW (KG): 50.9 Body mass index is 21.2 kg/m. Usual Weight:   Assessment:  Pharmacy consulted to dose TPN in patient with gastric perforation.  Glucose / Insulin:    151-178- 4 units Electrolytes: Na 131 , phos 3.5 > 4.5 Renal: Scr 1.64 > 2.28 Hepatic: albumin 1.5 Tbili 1.7 Intake / Output; MIVF: LR @ 100 mL/hr GI Surgeries / Procedures: Exploratory laparotomy, gastrorrhaphy  Central access: CVC triple lumen 7/2 TPN start date: 7/2  Nutritional Goals: Goal TPN rate is 70 mL/hr (provides 70 g of protein and 1300 kcals per day)  RD Assessment: Kcal:  1100-1300   Protein:  75-78 gr   Fluid:  >/= 1600 ml daily   Propofol stopped 7/3 AM  Current Nutrition:  NPO  Plan:  Increase TPN to 70 mL/hr at 1800 Electrolytes in TPN: Na 30mq/L, K 464m/L, Ca 64m76mL, Mg 64mE80m, and Phos 64mmo28m. Cl:Ac 1:2 Add standard MVI and trace elements to TPN Initiate Sensitive q6h SSI and adjust as needed  Stop MIVF  at 1800 Monitor TPN labs on Mon/Thurs,   SteveMargot AblesrmD Clinical Pharmacist 06/05/2022 10:54 AM

## 2022-06-05 NOTE — Progress Notes (Addendum)
[facility] Progress Note  1 Day Post-Op  Subjective: Awake follows commands, looks comfortable. Urine clearer. No pain reported.   Objective: Vital signs in last 24 hours: Temp:  [98 F (36.7 C)-99.5 F (37.5 C)] 98 F (36.7 C) (07/03 1122) Pulse Rate:  [80-118] 95 (07/03 0900) Resp:  [20-27] 22 (07/03 0900) BP: (73-113)/(46-64) 98/51 (07/03 0900) SpO2:  [95 %-100 %] 99 % (07/03 1122) FiO2 (%):  [40 %] 40 % (07/03 1122) Last BM Date :  (None during admission)  Intake/Output from previous day: 07/02 0701 - 07/03 0700 In: 2764.5 [I.V.:2568.3; IV Piggyback:196.2] Out: 480 [Urine:300; Emesis/NG output:100; Drains:30] Intake/Output this shift: Total I/O In: 1256.7 [I.V.:1131.3; IV Piggyback:125.4] Out: 110 [Urine:100; Drains:10]  General appearance: alert and no distress Resp: ventilated GI: soft, mildly distended, appropriately tender, honeycomb in place, JP with serous fluid   Lab Results:  Recent Labs    06/04/22 0444 06/05/22 0357  WBC 4.8 22.2*  HGB 12.4 12.0  HCT 38.4 36.1  PLT 249 175   BMET Recent Labs    06/04/22 0444 06/05/22 0357  NA 137 131*  K 4.0 4.6  CL 108 103  CO2 23 22  GLUCOSE 124* 187*  BUN 36* 48*  CREATININE 1.64* 2.28*  CALCIUM 8.3* 7.6*   PT/INR Recent Labs    06/03/22 2045  LABPROT 15.8*  INR 1.3*    Studies/Results: DG CHEST PORT 1 VIEW  Result Date: 06/05/2022 CLINICAL DATA:  76 year old female with respiratory failure. Intubated. EXAM: PORTABLE CHEST 1 VIEW COMPARISON:  Portable chest 06/04/2022 and earlier. FINDINGS: Portable AP semi upright view at 0527 hours. Endotracheal tube tip in good position between the clavicles and carina. Stable left IJ central line and visible enteric tube. Increased left lung base and retrocardiac opacity now mostly obscuring the left hemidiaphragm. Slightly lower lung volumes overall. Mediastinal contours remain normal. No pneumothorax, pulmonary edema, or definite effusion.  Negative right lung. Partially visible midline abdominal skin staples. Paucity of bowel gas in the upper abdomen. Stable left chest and axillary surgical clips. IMPRESSION: 1. Stable lines and tubes. 2. Increased left lower lobe collapse or consolidation since yesterday. Electronically Signed   By: Genevie Ann M.D.   On: 06/05/2022 08:26   DG Chest Port 1 View  Result Date: 06/04/2022 CLINICAL DATA:  Endotracheal tube, nasogastric tube and central line placement. EXAM: PORTABLE CHEST 1 VIEW COMPARISON:  June 03, 2022 FINDINGS: Since the prior study there has been interval placement of an endotracheal tube. Its distal tip is seen approximately 3.8 cm from the carina. Interval nasogastric tube placement is also seen. Its distal and extends into the gastric lumen. There is a left internal jugular venous catheter with its distal tip overlying the distal superior vena cava. This is approximately 6 mm proximal to the junction of the superior vena cava and right atrium. The heart size and mediastinal contours are within normal limits. Mild to moderate severity atelectasis and/or infiltrate is seen within the left lung base. This represents a new finding when compared to the prior study. There is no evidence of a pleural effusion or pneumothorax. Radiopaque surgical clips are seen along the left axilla. The air seen below the right hemidiaphragm on the prior study is no longer visualized. The visualized skeletal structures are unremarkable. IMPRESSION: 1. Interval endotracheal tube, nasogastric tube and left internal jugular venous catheter placement positioning, as described above. 2. Mild to moderate severity left basilar atelectasis and/or infiltrate. Electronically Signed   By: Hoover Browns  Houston M.D.   On: 06/04/2022 02:47   CT Abdomen Pelvis Wo Contrast  Result Date: 06/03/2022 CLINICAL DATA:  Severe weakness for 2 days with acute abdominal pain, initial encounter EXAM: CT ABDOMEN AND PELVIS WITHOUT CONTRAST  TECHNIQUE: Multidetector CT imaging of the abdomen and pelvis was performed following the standard protocol without IV contrast. RADIATION DOSE REDUCTION: This exam was performed according to the departmental dose-optimization program which includes automated exposure control, adjustment of the mA and/or kV according to patient size and/or use of iterative reconstruction technique. COMPARISON:  None Available. FINDINGS: Lower chest: Basilar atelectatic changes are noted. Hepatobiliary: Liver is within normal limits. Gallbladder demonstrates a normal appearance. Pancreas: Unremarkable. No pancreatic ductal dilatation or surrounding inflammatory changes. Spleen: Normal in size without focal abnormality. Adrenals/Urinary Tract: Adrenal glands are within normal limits. Kidneys demonstrate tiny nonobstructing stones on right in the lower pole. No ureteral obstruction is seen. The bladder is decompressed. Stomach/Bowel: Considerable fecal material is noted within the rectal vault which may represent a focal impaction. Diverticular change of the colon is noted as well as some generalized wall thickening throughout the colon. The appendix is within normal limits. Small bowel is within normal limits. Stomach demonstrates a defect in the anterior gastric wall consistent with a perforated ulcer. There is a considerable amount of air and fluid throughout the abdomen with apparent direct communication with the gastric best seen on image number 36 of series 2 and image number 78 of series 6. Vascular/Lymphatic: Aortic atherosclerosis. No enlarged abdominal or pelvic lymph nodes. Reproductive: Status post hysterectomy. No adnexal masses. Other: Free air and free fluid similar to that described above throughout the abdomen. The bowel demonstrates some reactive wall thickening related to the fluid and air. Musculoskeletal: Degenerative changes of the lumbar spine are seen. No other bony abnormality is noted. IMPRESSION: Changes  consistent with a perforated gastric ulcer anteriorly with considerable spillage of fluid and air into the abdominal cavity. Some reactive wall thickening is noted within the bowel loops secondary to these changes. Critical Value/emergent results were called by telephone at the time of interpretation on 06/03/2022 at 10:43 pm to Dr. Aletta Edouard , who verbally acknowledged these results. Electronically Signed   By: Inez Catalina M.D.   On: 06/03/2022 22:45   DG Chest Port 1 View  Result Date: 06/03/2022 CLINICAL DATA:  Weakness. EXAM: PORTABLE CHEST 1 VIEW COMPARISON:  None Available. FINDINGS: Multiple overlying radiopaque cardiac lead wires are seen. The heart size and mediastinal contours are within normal limits. Mild, diffuse, chronic appearing increased lung markings are seen. There is no evidence of an acute infiltrate, pleural effusion or pneumothorax. Radiopaque surgical clips are seen along the lateral aspect of the left chest wall. A crescentic area of air is seen just below the right hemidiaphragm. Degenerative changes seen throughout the thoracic spine. IMPRESSION: 1. Chronic appearing increased lung markings without evidence of acute or active cardiopulmonary disease. 2. Air seen just below the right hemidiaphragm which may represent an air filled loop of colon. Further evaluation with abdomen pelvis CT is recommended, as the presence of intra-abdominal free air cannot be excluded. Electronically Signed   By: Virgina Norfolk M.D.   On: 06/03/2022 21:22    Anti-infectives: Anti-infectives (From admission, onward)    Start     Dose/Rate Route Frequency Ordered Stop   06/05/22 1800  piperacillin-tazobactam (ZOSYN) IVPB 3.375 g        3.375 g 12.5 mL/hr over 240 Minutes Intravenous Every 12 hours 06/05/22  0825 06/08/22 2359   06/04/22 0600  piperacillin-tazobactam (ZOSYN) IVPB 3.375 g  Status:  Discontinued        3.375 g 12.5 mL/hr over 240 Minutes Intravenous Every 8 hours 06/04/22 0219  06/05/22 0825   06/04/22 0315  fluconazole (DIFLUCAN) IVPB 200 mg  Status:  Discontinued        200 mg 100 mL/hr over 60 Minutes Intravenous Every 24 hours 06/04/22 0217 06/04/22 0219   06/04/22 0315  fluconazole (DIFLUCAN) IVPB 200 mg        200 mg 100 mL/hr over 60 Minutes Intravenous Every 24 hours 06/04/22 0219 06/09/22 0314   06/03/22 2130  ceFEPIme (MAXIPIME) 2 g in sodium chloride 0.9 % 100 mL IVPB        2 g 200 mL/hr over 30 Minutes Intravenous  Once 06/03/22 2127 06/03/22 2209   06/03/22 2130  metroNIDAZOLE (FLAGYL) IVPB 500 mg  Status:  Discontinued        500 mg 100 mL/hr over 60 Minutes Intravenous Every 12 hours 06/03/22 2127 06/04/22 0217       Assessment/Plan: Patient with gastric ulcer perforation s/p Ex lap, gastrorrhaphy and omental patch. Doing well.  PRN for pain Propofol off for extubation  Extubating today  Levophed wean as tolerated, CVP 7, so may can use more fluids but on TPN and LR currently for rate of 170, so may need to adjust if getting fluid overload NPO, NG in place TPN  UGI for Thursday  PPI BID Zosyn and diflucan, WBC up slightly but monitoring, no fevers Cr up with acute kidney injury, urine is more clear, will monitor  JP drain  Labs tomorrow SCDs, heparin sq   Updated her son by phone. Discussed with team.  Dr. Arnoldo Morale will see tomorrow.    LOS: 1 day    Virl Cagey 06/05/2022

## 2022-06-05 NOTE — Progress Notes (Signed)
Initial Nutrition Assessment  DOCUMENTATION CODES:      INTERVENTION:  TPN per pharmacy   NUTRITION DIAGNOSIS:   Increased nutrient needs related to post-op healing as evidenced by estimated needs.  GOAL:  Patient will meet greater than or equal to 90% of their needs  MONITOR:  Labs, I & O's, Skin, Weight trends  REASON FOR ASSESSMENT:   Consult New TPN/TNA  ASSESSMENT:  Patient is a 76 yo female with gastric ulcer perforation. Status post exploratory laparotomy and omental patch. J/P drain.   Patient NPO with NGT. TPN @ 40 ml/hr via CVC left internal jugular.  Patient is currently intubated on ventilator support weaning attempt in process. Talked with nursing.   Stable weight history 49-50 kg x 6 months.   MV: 7.6 L/min Temp (24hrs), Avg:98.7 F (37.1 C), Min:98.2 F (36.8 C), Max:99.5 F (37.5 C)  Propofol: 0 ml/hr   Medications: insulin, protonix. Levophed-33.8 ml/hr.  IVF-lactated ringers @ 100 ml/hr.   Intake/Output Summary (Last 24 hours) at 06/05/2022 1131 Last data filed at 06/05/2022 0909 Gross per 24 hour  Intake 4021.19 ml  Output 590 ml  Net 3431.19 ml        Latest Ref Rng & Units 06/05/2022    3:57 AM 06/04/2022    4:44 AM 06/03/2022    9:53 PM  BMP  Glucose 70 - 99 mg/dL 187  124  117   BUN 8 - 23 mg/dL 48  36  33   Creatinine 0.44 - 1.00 mg/dL 2.28  1.64  2.10   Sodium 135 - 145 mmol/L 131  137  138   Potassium 3.5 - 5.1 mmol/L 4.6  4.0  3.9   Chloride 98 - 111 mmol/L 103  108  101   CO2 22 - 32 mmol/L 22  23    Calcium 8.9 - 10.3 mg/dL 7.6  8.3        NUTRITION - FOCUSED PHYSICAL EXAM: Partial Nutrition-Focused physical exam completed. Findings are no fat depletion, mild clavicle muscle depletion, and mild edema.     Diet Order:   Diet Order             Diet NPO time specified Except for: Ice Chips  Diet effective now                   EDUCATION NEEDS:  Not appropriate for education at this time  Skin:  Skin Assessment:  Skin Integrity Issues: Skin Integrity Issues:: Incisions Incisions: abdomen with honeycomb dressing  Last BM:  unknown  Height:   Ht Readings from Last 1 Encounters:  06/04/22 '5\' 1"'$  (1.549 m)    Weight:   Wt Readings from Last 1 Encounters:  06/04/22 50.9 kg    Ideal Body Weight:   48 kg  BMI:  Body mass index is 21.2 kg/m.  Estimated Nutritional Needs:   Kcal:  1100-1300  Protein:  75-78 gr  Fluid:  >/= 1600 ml daily  Colman Cater MS,RD,CSG,LDN Contact: Shea Evans

## 2022-06-05 NOTE — Procedures (Signed)
Extubation Procedure Note  Patient Details:   Name: Carolyn Erickson DOB: 1946-03-22 MRN: [id number]   Airway Documentation:    Vent end date: 06/05/22 Vent end time: 1257   Evaluation  O2 sats: stable throughout Complications: No apparent complications Patient did tolerate procedure well. Bilateral Breath Sounds: Clear, Diminished   Yes  Patient extubated and placed on 4L [place]. Patient has an adequate cough and is able to speak.  Blanchie Serve 06/05/2022, 12:57 PM

## 2022-06-05 NOTE — TOC Progression Note (Signed)
  Transition of Care [facility]) Screening Note   Patient Details  Name: Carolyn Erickson Date of Birth: 01/22/1946   Transition of Care [facility]) CM/SW Contact:    Shade Flood, LCSW Phone Number: 06/05/2022, 11:41 AM    Transition of Care Department [facility]) has reviewed patient and no TOC needs have been identified at this time. We will continue to monitor patient advancement through interdisciplinary progression rounds. If new patient transition needs arise, please place a TOC consult.

## 2022-06-05 NOTE — Progress Notes (Signed)
PROGRESS NOTE  Carolyn Erickson QAS:[id number] DOB: 08/19/1946 DOA: 06/03/2022 PCP: Dettinger, Fransisca Kaufmann, MD  Brief History:  76 year old female with history of major neurocognitive disorder, hypertension, hyperlipidemia, depression, left breast cancer presenting with generalized weakness and abdominal pain.  Apparently, the patient has been having abdominal pain since April 2023.  The patient had been started on pantoprazole at that time.  There was no improvement.  She followed up with her PCP on 06/02/2022.  Referral was given to see gastroenterology.  Abdominal pain had worsened over the past 2 days prior to admission with worsening generalized weakness.  There is no fevers, chills, chest pain, shortness breath, vomiting, diarrhea.  There is no hematochezia or melena noted. In the emergency department, the patient was hypotensive with a blood pressure of 71/54.  She was somnolent.  CT of the abdomen and pelvis was performed and showed changes consistent with a perforated gastric ulcer.  Dr. Constance Haw was consulted, and the patient was taken to the OR for exploratory laparotomy, gastrorrhaphy, omental patch.  A left IJ central line was also placed.  TRH is consulted for medical management In the ED, the patient had low-grade fever 99.5 F.  She was tachycardic and hypotensive with blood pressure 68/53.  WBC 4.1, hemoglobin 14.6, platelets 308,000.  BMP showed sodium 137, potassium 4.1, bicarbonate 22, serum creatinine 2.14.  Postoperatively, the patient was started on Zosyn and fluconazole.  She remained on the ventilator.  Preoperative chest x-ray showed bibasilar atelectasis.  There was free air under the diaphragm.   Assessment and Plan: * Septic shock ([place]) Presented with hypotension, tachycardia, and lactic acid peaking at 6.8 Currently on Levophed>> wean as tolerated for SBP greater 90 ---currently at 9 mcg/kg/min Secondary to gastric perforation with peritonitis Follow blood  cultures--neg to date UA 11-20 WBC>>urine culture unrevealing Continue Zosyn Continue fluconazole WBC up>>partly demargination from vasopressor vs spurious  Gastric perforation (HCC) Suspect this may be due to chronic NSAID use -Continue PPI twice daily -06/04/2022--ex laparotomy with omental patch -Postoperative care per Dr. Constance Haw -Continue TPN for now -planning UGI 7/6  Acute renal failure superimposed on stage 3a chronic kidney disease (HCC) Baseline creatinine 1.0-1.1 -Serum creatinine peaking 2.28 -Secondary to hemodynamic changes/sepsis and hypovolemia -Monitor serial BMPs  Hypomagnesemia Repleting  Acute respiratory failure with hypoxia (HCC) Currently PRVC, RR 20, Vt 380, FiO2 0.4 -7/3 personally reviewed CXR--increased interstitial marking -7/3 ABG--7.35/36/107/20 (0.4) -7/3--extubated  Major neurocognitive disorder [facility]) Patient had MoCa 17/30 on 03/14/22 -Restart Aricept once stable and clinically -At risk for hospital delirium  GERD (gastroesophageal reflux disease) Continue pantoprazole IV twice daily  Essential hypertension Holding amlodipine secondary to hypotension         Family Communication:   no Family at bedside  Consultants:  general surgery  Code Status:  FULL   DVT Prophylaxis:  [place] Heparin   Procedures: As Listed in Progress Note Above  Antibiotics: Zosyn 7/2>>      Subjective: Patient awake on vent.  Denies f/c, n/v/d, abd pain.  Objective: Vitals:   06/05/22 1122 06/05/22 1200 06/05/22 1300 06/05/22 1317  BP:  (!) 109/56 (!) 116/59   Pulse:  87 (!) 103   Resp:  16 11   Temp: 98 F (36.7 C)     TempSrc: Axillary     SpO2: 99% 100% 99% 99%  Weight:      Height:        Intake/Output Summary (Last 24 hours)  at 06/05/2022 1424 Last data filed at 06/05/2022 1314 Gross per 24 hour  Intake 4488.35 ml  Output 835 ml  Net 3653.35 ml   Weight change:  Exam:  General:  Pt is alert, follows commands appropriately,  not in acute distress HEENT: No icterus, No thrush, No neck mass, [place]/AT Cardiovascular: RRR, S1/S2, no rubs, no gallops Respiratory: bibasilar rales. No wheeze Abdomen: Soft/+BS, non tender, non distended, no guarding Extremities: No edema, No lymphangitis, No petechiae, No rashes, no synovitis   Data Reviewed: I have personally reviewed following labs and imaging studies Basic Metabolic Panel: Recent Labs  Lab 06/03/22 2045 06/03/22 2153 06/04/22 0444 06/05/22 0357  NA 137 138 137 131*  K 4.1 3.9 4.0 4.6  CL 100 101 108 103  CO2 22  --  23 22  GLUCOSE 130* 117* 124* 187*  BUN 36* 33* 36* 48*  CREATININE 2.14* 2.10* 1.64* 2.28*  CALCIUM 9.6  --  8.3* 7.6*  MG  --   --  1.3* 2.1  PHOS  --   --  3.5 4.5   Liver Function Tests: Recent Labs  Lab 06/03/22 2045 06/05/22 0357  AST 27 20  ALT 25 19  ALKPHOS 62 46  BILITOT 1.8* 1.7*  PROT 6.9 4.0*  ALBUMIN 3.2* 1.5*   Recent Labs  Lab 06/03/22 2133  LIPASE 24   No results for input(s): "AMMONIA" in the last 168 hours. Coagulation Profile: Recent Labs  Lab 06/03/22 2045  INR 1.3*   CBC: Recent Labs  Lab 06/03/22 2045 06/03/22 2153 06/04/22 0444 06/05/22 0357  WBC 4.1  --  4.8 22.2*  NEUTROABS 2.9  --  2.5  --   HGB 14.6 13.6 12.4 12.0  HCT 46.4* 40.0 38.4 36.1  MCV 95.3  --  94.1 92.3  PLT 308  --  249 175   Cardiac Enzymes: No results for input(s): "CKTOTAL", "CKMB", "CKMBINDEX", "TROPONINI" in the last 168 hours. BNP: Invalid input(s): "POCBNP" CBG: Recent Labs  Lab 06/04/22 1125 06/04/22 1646 06/04/22 2313 06/05/22 0730 06/05/22 1132  GLUCAP 97 90 151* 178* 141*   HbA1C: No results for input(s): "HGBA1C" in the last 72 hours. Urine analysis:    Component Value Date/Time   COLORURINE AMBER (A) 06/04/2022 0520   APPEARANCEUR CLOUDY (A) 06/04/2022 0520   APPEARANCEUR Cloudy (A) 04/13/2021 1530   LABSPEC 1.027 06/04/2022 0520   PHURINE 5.0 06/04/2022 0520   GLUCOSEU NEGATIVE 06/04/2022  0520   HGBUR NEGATIVE 06/04/2022 0520   BILIRUBINUR NEGATIVE 06/04/2022 0520   BILIRUBINUR Negative 04/13/2021 [place] 06/04/2022 0520   PROTEINUR 100 (A) 06/04/2022 0520   NITRITE NEGATIVE 06/04/2022 0520   LEUKOCYTESUR SMALL (A) 06/04/2022 0520   Sepsis Labs: '@LABRCNTIP'$ (procalcitonin:4,lacticidven:4) ) Recent Results (from the past 240 hour(s))  Culture, blood (Routine x 2)     Status: None (Preliminary result)   Collection Time: 06/03/22  8:45 PM   Specimen: Left Antecubital; Blood  Result Value Ref Range Status   Specimen Description LEFT ANTECUBITAL  Final   Special Requests   Final    BOTTLES DRAWN AEROBIC AND ANAEROBIC Blood Culture adequate volume   Culture   Final    NO GROWTH 2 DAYS Performed at [facility], [address] [place], [place] [address]    Report Status PENDING  Incomplete  Culture, blood (Routine x 2)     Status: None (Preliminary result)   Collection Time: 06/03/22  9:32 PM   Specimen: Left Antecubital; Blood  Result Value Ref Range Status   Specimen Description LEFT ANTECUBITAL  Final   Special Requests   Final    BOTTLES DRAWN AEROBIC AND ANAEROBIC Blood Culture adequate volume   Culture   Final    NO GROWTH 2 DAYS Performed at [facility], [address], [place], [place] [address]    Report Status PENDING  Incomplete  MRSA Next Gen by PCR, Nasal     Status: None   Collection Time: 06/04/22  2:18 AM   Specimen: Nasal Mucosa; Nasal Swab  Result Value Ref Range Status   MRSA by PCR Next Gen NOT DETECTED NOT DETECTED Final    Comment: (NOTE) The GeneXpert MRSA Assay (FDA approved for NASAL specimens only), is one component of a comprehensive MRSA colonization surveillance program. It is not intended to diagnose MRSA infection nor to guide or monitor treatment for MRSA infections. Test performance is not FDA approved in patients less than 74 years old. Performed at [facility], [address], [place], [place] [address]    Urine Culture     Status: Abnormal   Collection Time: 06/04/22  2:28 AM   Specimen: PATH Cytology Urine  Result Value Ref Range Status   Specimen Description   Final    URINE, CLEAN CATCH Performed at [facility], [address], [place], [place] [address]    Special Requests   Final    NONE Performed at [facility], [address] [place], [place] [address]    Culture (A)  Final    <10,000 COLONIES/mL INSIGNIFICANT GROWTH Performed at [facility] [address], [place], [place] [address]    Report Status 06/05/2022 FINAL  Final  Aerobic/Anaerobic Culture w Gram Stain (surgical/deep wound)     Status: None (Preliminary result)   Collection Time: 06/04/22  3:03 AM   Specimen: PATH Cytology FNA; Body Fluid  Result Value Ref Range Status   Specimen Description   Final    ANAL Performed at [facility], [address], [place], [place] [address]    Special Requests   Final    NONE Performed at [facility], [address] [place],  [address]    Gram Stain   Final    MODERATE WBC PRESENT,BOTH PMN AND MONONUCLEAR FEW GRAM POSITIVE COCCI IN PAIRS MODERATE GRAM POSITIVE RODS    Culture   Final    CULTURE REINCUBATED FOR BETTER GROWTH Performed at [facility], [place] [address], [place],  [address]    Report Status PENDING  Incomplete     Scheduled Meds:  Chlorhexidine Gluconate Cloth  6 each Topical Daily   heparin  5,000 Units Subcutaneous Q8H   insulin aspart  0-9 Units Subcutaneous Q8H   mouth rinse  15 mL Mouth Rinse Q4H   pantoprazole (PROTONIX) IV  40 mg Intravenous Q12H   Continuous Infusions:  sodium chloride     fluconazole (DIFLUCAN) IV Stopped (06/05/22 0329)   norepinephrine (LEVOPHED) Adult infusion 8 mcg/min (06/05/22 1314)   piperacillin-tazobactam (ZOSYN)  IV     propofol (DIPRIVAN) infusion Stopped (06/05/22 0833)   TPN ADULT (ION) 40 mL/hr at 06/05/22 1314   TPN ADULT (ION)      Procedures/Studies: DG CHEST PORT 1  VIEW  Result Date: 06/05/2022 CLINICAL DATA:  76 year old female with respiratory failure. Intubated. EXAM: PORTABLE CHEST 1 VIEW COMPARISON:  Portable chest 06/04/2022 and earlier. FINDINGS: Portable AP semi upright view at 0527 hours. Endotracheal tube tip in good position between the  clavicles and carina. Stable left IJ central line and visible enteric tube. Increased left lung base and retrocardiac opacity now mostly obscuring the left hemidiaphragm. Slightly lower lung volumes overall. Mediastinal contours remain normal. No pneumothorax, pulmonary edema, or definite effusion. Negative right lung. Partially visible midline abdominal skin staples. Paucity of bowel gas in the upper abdomen. Stable left chest and axillary surgical clips. IMPRESSION: 1. Stable lines and tubes. 2. Increased left lower lobe collapse or consolidation since yesterday. Electronically Signed   By: Genevie Ann M.D.   On: 06/05/2022 08:26   DG Chest Port 1 View  Result Date: 06/04/2022 CLINICAL DATA:  Endotracheal tube, nasogastric tube and central line placement. EXAM: PORTABLE CHEST 1 VIEW COMPARISON:  June 03, 2022 FINDINGS: Since the prior study there has been interval placement of an endotracheal tube. Its distal tip is seen approximately 3.8 cm from the carina. Interval nasogastric tube placement is also seen. Its distal and extends into the gastric lumen. There is a left internal jugular venous catheter with its distal tip overlying the distal superior vena cava. This is approximately 6 mm proximal to the junction of the superior vena cava and right atrium. The heart size and mediastinal contours are within normal limits. Mild to moderate severity atelectasis and/or infiltrate is seen within the left lung base. This represents a new finding when compared to the prior study. There is no evidence of a pleural effusion or pneumothorax. Radiopaque surgical clips are seen along the left axilla. The air seen below the right hemidiaphragm on  the prior study is no longer visualized. The visualized skeletal structures are unremarkable. IMPRESSION: 1. Interval endotracheal tube, nasogastric tube and left internal jugular venous catheter placement positioning, as described above. 2. Mild to moderate severity left basilar atelectasis and/or infiltrate. Electronically Signed   By: Virgina Norfolk M.D.   On: 06/04/2022 02:47   CT Abdomen Pelvis Wo Contrast  Result Date: 06/03/2022 CLINICAL DATA:  Severe weakness for 2 days with acute abdominal pain, initial encounter EXAM: CT ABDOMEN AND PELVIS WITHOUT CONTRAST TECHNIQUE: Multidetector CT imaging of the abdomen and pelvis was performed following the standard protocol without IV contrast. RADIATION DOSE REDUCTION: This exam was performed according to the departmental dose-optimization program which includes automated exposure control, adjustment of the mA and/or kV according to patient size and/or use of iterative reconstruction technique. COMPARISON:  None Available. FINDINGS: Lower chest: Basilar atelectatic changes are noted. Hepatobiliary: Liver is within normal limits. Gallbladder demonstrates a normal appearance. Pancreas: Unremarkable. No pancreatic ductal dilatation or surrounding inflammatory changes. Spleen: Normal in size without focal abnormality. Adrenals/Urinary Tract: Adrenal glands are within normal limits. Kidneys demonstrate tiny nonobstructing stones on right in the lower pole. No ureteral obstruction is seen. The bladder is decompressed. Stomach/Bowel: Considerable fecal material is noted within the rectal vault which may represent a focal impaction. Diverticular change of the colon is noted as well as some generalized wall thickening throughout the colon. The appendix is within normal limits. Small bowel is within normal limits. Stomach demonstrates a defect in the anterior gastric wall consistent with a perforated ulcer. There is a considerable amount of air and fluid throughout the  abdomen with apparent direct communication with the gastric best seen on image number 36 of series 2 and image number 78 of series 6. Vascular/Lymphatic: Aortic atherosclerosis. No enlarged abdominal or pelvic lymph nodes. Reproductive: Status post hysterectomy. No adnexal masses. Other: Free air and free fluid similar to that described above throughout the abdomen. The bowel  demonstrates some reactive wall thickening related to the fluid and air. Musculoskeletal: Degenerative changes of the lumbar spine are seen. No other bony abnormality is noted. IMPRESSION: Changes consistent with a perforated gastric ulcer anteriorly with considerable spillage of fluid and air into the abdominal cavity. Some reactive wall thickening is noted within the bowel loops secondary to these changes. Critical Value/emergent results were called by telephone at the time of interpretation on 06/03/2022 at 10:43 pm to Dr. Aletta Edouard , who verbally acknowledged these results. Electronically Signed   By: Inez Catalina M.D.   On: 06/03/2022 22:45   DG Chest Port 1 View  Result Date: 06/03/2022 CLINICAL DATA:  Weakness. EXAM: PORTABLE CHEST 1 VIEW COMPARISON:  None Available. FINDINGS: Multiple overlying radiopaque cardiac lead wires are seen. The heart size and mediastinal contours are within normal limits. Mild, diffuse, chronic appearing increased lung markings are seen. There is no evidence of an acute infiltrate, pleural effusion or pneumothorax. Radiopaque surgical clips are seen along the lateral aspect of the left chest wall. A crescentic area of air is seen just below the right hemidiaphragm. Degenerative changes seen throughout the thoracic spine. IMPRESSION: 1. Chronic appearing increased lung markings without evidence of acute or active cardiopulmonary disease. 2. Air seen just below the right hemidiaphragm which may represent an air filled loop of colon. Further evaluation with abdomen pelvis CT is recommended, as the presence  of intra-abdominal free air cannot be excluded. Electronically Signed   By: Virgina Norfolk M.D.   On: 06/03/2022 21:22    Orson Eva, DO  [facility]  If 7PM-7AM, please contact night-coverage [email or web address] Password [email or web address] 06/05/2022, 2:24 PM   LOS: 1 day

## 2022-06-06 DIAGNOSIS — N179 Acute kidney failure, unspecified: Secondary | ICD-10-CM | POA: Diagnosis not present

## 2022-06-06 DIAGNOSIS — A419 Sepsis, unspecified organism: Secondary | ICD-10-CM | POA: Diagnosis not present

## 2022-06-06 DIAGNOSIS — B49 Unspecified mycosis: Secondary | ICD-10-CM | POA: Diagnosis present

## 2022-06-06 DIAGNOSIS — R6521 Severe sepsis with septic shock: Secondary | ICD-10-CM | POA: Diagnosis not present

## 2022-06-06 DIAGNOSIS — K251 Acute gastric ulcer with perforation: Secondary | ICD-10-CM | POA: Diagnosis not present

## 2022-06-06 LAB — BLOOD CULTURE ID PANEL (REFLEXED) - BCID2

## 2022-06-06 LAB — COMPREHENSIVE METABOLIC PANEL
ALT: 15 U/L (ref 0–44)
AST: 17 U/L (ref 15–41)
Albumin: <1.5 g/dL — ABNORMAL LOW (ref 3.5–5.0)
Alkaline Phosphatase: 56 U/L (ref 38–126)
Anion gap: 5 (ref 5–15)
BUN: 50 mg/dL — ABNORMAL HIGH (ref 8–23)
CO2: 24 mmol/L (ref 22–32)
Calcium: 7.8 mg/dL — ABNORMAL LOW (ref 8.9–10.3)
Chloride: 105 mmol/L (ref 98–111)
Creatinine, Ser: 2.05 mg/dL — ABNORMAL HIGH (ref 0.44–1.00)
GFR, Estimated: 24 mL/min — ABNORMAL LOW (ref >60)
Glucose, Bld: 125 mg/dL — ABNORMAL HIGH (ref 70–99)
Potassium: 4.6 mmol/L (ref 3.5–5.1)
Sodium: 134 mmol/L — ABNORMAL LOW (ref 135–145)
Total Bilirubin: 1.8 mg/dL — ABNORMAL HIGH (ref 0.3–1.2)
Total Protein: 3.9 g/dL — ABNORMAL LOW (ref 6.5–8.1)

## 2022-06-06 LAB — CBC WITH DIFFERENTIAL/PLATELET
Abs Immature Granulocytes: 0.59 10*3/uL — ABNORMAL HIGH (ref 0.00–0.07)
Basophils Absolute: 0.2 10*3/uL — ABNORMAL HIGH (ref 0.0–0.1)
Basophils Relative: 1 %
Eosinophils Absolute: 0.9 10*3/uL — ABNORMAL HIGH (ref 0.0–0.5)
Eosinophils Relative: 4 %
HCT: 33.7 % — ABNORMAL LOW (ref 36.0–46.0)
Hemoglobin: 10.9 g/dL — ABNORMAL LOW (ref 12.0–15.0)
Immature Granulocytes: 3 %
Lymphocytes Relative: 4 %
Lymphs Abs: 1 10*3/uL (ref 0.7–4.0)
MCH: 30.6 pg (ref 26.0–34.0)
MCHC: 32.3 g/dL (ref 30.0–36.0)
MCV: 94.7 fL (ref 80.0–100.0)
Monocytes Absolute: 0.7 10*3/uL (ref 0.1–1.0)
Monocytes Relative: 3 %
Neutro Abs: 20.5 10*3/uL — ABNORMAL HIGH (ref 1.7–7.7)
Neutrophils Relative %: 85 %
Platelets: 137 10*3/uL — ABNORMAL LOW (ref 150–400)
RBC: 3.56 MIL/uL — ABNORMAL LOW (ref 3.87–5.11)
RDW: 13.1 % (ref 11.5–15.5)
WBC: 24 10*3/uL — ABNORMAL HIGH (ref 4.0–10.5)
nRBC: 0.1 % (ref 0.0–0.2)

## 2022-06-06 LAB — GLUCOSE, CAPILLARY
Glucose-Capillary: 106 mg/dL — ABNORMAL HIGH (ref 70–99)
Glucose-Capillary: 135 mg/dL — ABNORMAL HIGH (ref 70–99)
Glucose-Capillary: 157 mg/dL — ABNORMAL HIGH (ref 70–99)
Glucose-Capillary: 68 mg/dL — ABNORMAL LOW (ref 70–99)

## 2022-06-06 LAB — MAGNESIUM: Magnesium: 2.1 mg/dL (ref 1.7–2.4)

## 2022-06-06 LAB — PHOSPHORUS: Phosphorus: 4.5 mg/dL (ref 2.5–4.6)

## 2022-06-06 MED ORDER — TRAVASOL 10 % IV SOLN: INTRAVENOUS | Status: DC

## 2022-06-06 MED ORDER — SODIUM CHLORIDE 0.9 % IV SOLN: INTRAVENOUS | Status: DC

## 2022-06-06 MED ORDER — DEXTROSE-NACL 5-0.9 % IV SOLN: INTRAVENOUS | Status: AC

## 2022-06-06 MED ORDER — DEXTROSE 50 % IV SOLN
12.5000 g | INTRAVENOUS | Status: AC
Start: 2022-06-06 — End: 2022-06-06
  Administered 2022-06-06: 12.5 g via INTRAVENOUS
  Filled 2022-06-06: qty 50

## 2022-06-06 MED ORDER — SODIUM CHLORIDE 0.9 % IV SOLN
100.0000 mg | INTRAVENOUS | Status: AC
  Administered 2022-06-06 – 2022-06-20 (×15): 100 mg via INTRAVENOUS
  Filled 2022-06-06 (×16): qty 5

## 2022-06-06 MED ORDER — FLUCONAZOLE IN SODIUM CHLORIDE 200-0.9 MG/100ML-% IV SOLN
200.0000 mg | INTRAVENOUS | Status: DC
  Filled 2022-06-06: qty 100

## 2022-06-06 NOTE — Progress Notes (Addendum)
PHARMACY - TOTAL PARENTERAL NUTRITION CONSULT NOTE   Indication: gastric perforation  Patient Measurements: Height: '5\' 1"'$  (154.9 cm) Weight: 50.9 kg (112 lb 3.4 oz) IBW/kg (Calculated) : 47.8 TPN AdjBW (KG): 50.9 Body mass index is 21.2 kg/m. Usual Weight:   Assessment:  Pharmacy consulted to dose TPN in patient with gastric perforation.  Glucose / Insulin:    135-157- 4 units Electrolytes: Na 134 , phos 4.5 Renal: Scr 1.64 > 2.28 Hepatic: albumin 1.5 Tbili 1.7 Intake / Output; MIVF: LR @ 100 mL/hr GI Surgeries / Procedures: Exploratory laparotomy, gastrorrhaphy  Central access: CVC triple lumen 7/2 TPN start date: 7/2  Nutritional Goals: Goal TPN rate is 70 mL/hr (provides 70 g of protein and 1300 kcals per day)  RD Assessment: Kcal:  1100-1300   Protein:  75-78 gr   Fluid:  >/= 1600 ml daily   Propofol stopped 7/3 AM  Current Nutrition:  NPO  Plan:  Per Dr. Arnoldo Morale will need to hold TPN due to fungal bacteremia and take current central line out   Margot Ables, PharmD Clinical Pharmacist 06/06/2022 9:00 AM

## 2022-06-06 NOTE — Progress Notes (Signed)
PROGRESS NOTE  Carolyn Erickson UJW:[id number] DOB: 11/18/1946 DOA: 06/03/2022 PCP: Dettinger, Fransisca Kaufmann, MD  Brief History:  76 year old female with history of major neurocognitive disorder, hypertension, hyperlipidemia, depression, left breast cancer presenting with generalized weakness and abdominal pain.  Apparently, the patient has been having abdominal pain since April 2023.  The patient had been started on pantoprazole at that time.  There was no improvement.  She followed up with her PCP on 06/02/2022.  Referral was given to see gastroenterology.  Abdominal pain had worsened over the past 2 days prior to admission with worsening generalized weakness.  There is no fevers, chills, chest pain, shortness breath, vomiting, diarrhea.  There is no hematochezia or melena noted. In the emergency department, the patient was hypotensive with a blood pressure of 71/54.  She was somnolent.  CT of the abdomen and pelvis was performed and showed changes consistent with a perforated gastric ulcer.  Dr. Constance Haw was consulted, and the patient was taken to the OR for exploratory laparotomy, gastrorrhaphy, omental patch.  A left IJ central line was also placed.  TRH is consulted for medical management In the ED, the patient had low-grade fever 99.5 F.  She was tachycardic and hypotensive with blood pressure 68/53.  WBC 4.1, hemoglobin 14.6, platelets 308,000.  BMP showed sodium 137, potassium 4.1, bicarbonate 22, serum creatinine 2.14.  Postoperatively, the patient was started on Zosyn and fluconazole.  She remained on the ventilator.  Preoperative chest x-ray showed bibasilar atelectasis.  There was free air under the diaphragm.     Assessment and Plan: * Septic shock ([place]) Presented with hypotension, tachycardia, and lactic acid peaking at 6.8 Currently on Levophed>> wean as tolerated for SBP greater 90 ---currently at 1-2 mcg/kg/min Secondary to gastric perforation with peritonitis 06/03/22 blood  cultures--C. glabrata UA 11-20 WBC>>urine culture unrevealing Continue Zosyn disContinue fluconazole, start echinocandin (micafungin) WBC up>>partly demargination from vasopressor vs spurious CVP 7-8>>12-14  Gastric perforation (HCC) Suspect this may be due to chronic NSAID use -Continue PPI twice daily -06/04/2022--ex laparotomy with omental patch -Postoperative care per Dr. Constance Haw -Continue TPN for now>>on hold temporarily due to need for CVL holiday -planning UGI 7/6  Fungemia 06/03/22 blood cultures--Candida glabrata -d/c fluconazole -start echinocandin -discussed with general surgery>>remove central line -will need at least 24 hours central line holiday -repeat blood culture  Acute renal failure superimposed on stage 3a chronic kidney disease ([place]) Baseline creatinine 1.0-1.1 -Serum creatinine peaking 2.28 -Secondary to hemodynamic changes/sepsis and hypovolemia -Monitor serial BMPs  Hypomagnesemia Repleting  Acute respiratory failure with hypoxia (HCC) Currently PRVC, RR 20, Vt 380, FiO2 0.4 -7/3 personally reviewed CXR--increased interstitial marking -7/3 ABG--7.35/36/107/20 (0.4) -7/3--extubated -7/4--stable on 4L [place]  Major neurocognitive disorder ([place]) Patient had MoCa 17/30 on 03/14/22 -Restart Aricept once stable and clinically -At risk for hospital delirium  GERD (gastroesophageal reflux disease) Continue pantoprazole IV twice daily  Essential hypertension Holding amlodipine secondary to hypotension     Family Communication:   Family at bedside 7/4  Consultants:  general surgery  Code Status:  FULL   DVT Prophylaxis:  SCDs  Procedures: As Listed in Progress Note Above  Antibiotics: Fluconazole 7/2>>7/4 Micafungin 7/4>> Zosyn 7/2>>     Subjective: Patient is pleasantly confused.  She denies any fevers, chills, chest pain, shortness breath, abdominal pain.  There is no vomiting or diarrhea.  No flatus or bowel  movements.  Objective: Vitals:   06/06/22 1115 06/06/22 1130 06/06/22 1200 06/06/22  1215  BP:      Pulse:  (!) 101 100 (!) 101  Resp:  '18 12 11  '$ Temp: 98.1 F (36.7 C)     TempSrc: Oral     SpO2:  98% 99% 99%  Weight:      Height:        Intake/Output Summary (Last 24 hours) at 06/06/2022 1326 Last data filed at 06/06/2022 1200 Gross per 24 hour  Intake 4138.7 ml  Output 1390 ml  Net 2748.7 ml   Weight change:  Exam:  General:  Pt is alert, follows commands appropriately, not in acute distress HEENT: No icterus, No thrush, No neck mass, /AT Cardiovascular: RRR, S1/S2, no rubs, no gallops Respiratory: Bibasilar rales.  No wheezing.  Good air movement. Abdomen: Soft/+BS, non tender, non distended, no guarding Extremities: trace LE edema, No lymphangitis, No petechiae, No rashes, no synovitis   Data Reviewed: I have personally reviewed following labs and imaging studies Basic Metabolic Panel: Recent Labs  Lab 06/03/22 2045 06/03/22 2153 06/04/22 0444 06/05/22 0357 06/06/22 0352  NA 137 138 137 131* 134*  K 4.1 3.9 4.0 4.6 4.6  CL 100 101 108 103 105  CO2 22  --  '23 22 24  '$ GLUCOSE 130* 117* 124* 187* 125*  BUN 36* 33* 36* 48* 50*  CREATININE 2.14* 2.10* 1.64* 2.28* 2.05*  CALCIUM 9.6  --  8.3* 7.6* 7.8*  MG  --   --  1.3* 2.1 2.1  PHOS  --   --  3.5 4.5 4.5   Liver Function Tests: Recent Labs  Lab 06/03/22 2045 06/05/22 0357 06/06/22 0352  AST '27 20 17  '$ ALT '25 19 15  '$ ALKPHOS 62 46 56  BILITOT 1.8* 1.7* 1.8*  PROT 6.9 4.0* 3.9*  ALBUMIN 3.2* 1.5* <1.5*   Recent Labs  Lab 06/03/22 2133  LIPASE 24   No results for input(s): "AMMONIA" in the last 168 hours. Coagulation Profile: Recent Labs  Lab 06/03/22 2045  INR 1.3*   CBC: Recent Labs  Lab 06/03/22 2045 06/03/22 2153 06/04/22 0444 06/05/22 0357 06/06/22 0352  WBC 4.1  --  4.8 22.2* 24.0*  NEUTROABS 2.9  --  2.5  --  20.5*  HGB 14.6 13.6 12.4 12.0 10.9*  HCT 46.4* 40.0 38.4 36.1 33.7*   MCV 95.3  --  94.1 92.3 94.7  PLT 308  --  249 175 137*   Cardiac Enzymes: No results for input(s): "CKTOTAL", "CKMB", "CKMBINDEX", "TROPONINI" in the last 168 hours. BNP: Invalid input(s): "POCBNP" CBG: Recent Labs  Lab 06/05/22 0730 06/05/22 1132 06/05/22 1954 06/06/22 0014 06/06/22 0744  GLUCAP 178* 141* 142* 157* 135*   HbA1C: No results for input(s): "HGBA1C" in the last 72 hours. Urine analysis:    Component Value Date/Time   COLORURINE AMBER (A) 06/04/2022 0520   APPEARANCEUR CLOUDY (A) 06/04/2022 0520   APPEARANCEUR Cloudy (A) 04/13/2021 1530   LABSPEC 1.027 06/04/2022 0520   PHURINE 5.0 06/04/2022 0520   GLUCOSEU NEGATIVE 06/04/2022 0520   HGBUR NEGATIVE 06/04/2022 0520   BILIRUBINUR NEGATIVE 06/04/2022 0520   BILIRUBINUR Negative 04/13/2021 1530   KETONESUR NEGATIVE 06/04/2022 0520   PROTEINUR 100 (A) 06/04/2022 0520   NITRITE NEGATIVE 06/04/2022 0520   LEUKOCYTESUR SMALL (A) 06/04/2022 0520   Sepsis Labs: '@LABRCNTIP'$ (procalcitonin:4,lacticidven:4) ) Recent Results (from the past 240 hour(s))  Culture, blood (Routine x 2)     Status: None (Preliminary result)   Collection Time: 06/03/22  8:45 PM   Specimen: BLOOD  Result Value Ref Range Status   Specimen Description   Final    BLOOD LEFT ANTECUBITAL Performed at [facility], [place] [address], [place], [place] [address]    Special Requests   Final    BOTTLES DRAWN AEROBIC AND ANAEROBIC Blood Culture adequate volume Performed at [facility], [address], [place], [place] [address]    Culture  Setup Time   Final    YEAST BOTH Gram Stain Report Called to,Read Back By and Verified With: JACOBS, U'@0745'$  BY MATTHEWS, B 7.4.2023 CRITICAL RESULT CALLED TO, READ BACK BY AND VERIFIED WITH: PHARMD S.HURTZ AT 1130 ON 06/06/2022 BY T.SAAD. Performed at [facility], [place] [address] [place], [place] [address]    Culture YEAST  Final   Report Status PENDING  Incomplete  Blood Culture ID Panel  (Reflexed)     Status: Abnormal   Collection Time: 06/03/22  8:45 PM  Result Value Ref Range Status   Enterococcus faecalis NOT DETECTED NOT DETECTED Final   Enterococcus Faecium NOT DETECTED NOT DETECTED Final   Listeria monocytogenes NOT DETECTED NOT DETECTED Final   Staphylococcus species NOT DETECTED NOT DETECTED Final   Staphylococcus aureus (BCID) NOT DETECTED NOT DETECTED Final   Staphylococcus epidermidis NOT DETECTED NOT DETECTED Final   Staphylococcus lugdunensis NOT DETECTED NOT DETECTED Final   Streptococcus species NOT DETECTED NOT DETECTED Final   Streptococcus agalactiae NOT DETECTED NOT DETECTED Final   Streptococcus pneumoniae NOT DETECTED NOT DETECTED Final   Streptococcus pyogenes NOT DETECTED NOT DETECTED Final   A.calcoaceticus-baumannii NOT DETECTED NOT DETECTED Final   Bacteroides fragilis NOT DETECTED NOT DETECTED Final   Enterobacterales NOT DETECTED NOT DETECTED Final   Enterobacter cloacae complex NOT DETECTED NOT DETECTED Final   Escherichia coli NOT DETECTED NOT DETECTED Final   Klebsiella aerogenes NOT DETECTED NOT DETECTED Final   Klebsiella oxytoca NOT DETECTED NOT DETECTED Final   Klebsiella pneumoniae NOT DETECTED NOT DETECTED Final   Proteus species NOT DETECTED NOT DETECTED Final   Salmonella species NOT DETECTED NOT DETECTED Final   Serratia marcescens NOT DETECTED NOT DETECTED Final   Haemophilus influenzae NOT DETECTED NOT DETECTED Final   Neisseria meningitidis NOT DETECTED NOT DETECTED Final   Pseudomonas aeruginosa NOT DETECTED NOT DETECTED Final   Stenotrophomonas maltophilia NOT DETECTED NOT DETECTED Final   Candida albicans NOT DETECTED NOT DETECTED Final   Candida auris NOT DETECTED NOT DETECTED Final   Candida glabrata DETECTED (A) NOT DETECTED Final    Comment: CRITICAL RESULT CALLED TO, READ BACK BY AND VERIFIED WITH: PHARMD S.HURTZ AT 1130 ON 06/06/2022 BY T.SAAD.    Candida krusei NOT DETECTED NOT DETECTED Final   Candida  parapsilosis NOT DETECTED NOT DETECTED Final   Candida tropicalis NOT DETECTED NOT DETECTED Final   Cryptococcus neoformans/gattii NOT DETECTED NOT DETECTED Final    Comment: Performed at [facility], [place] [address] [place], [place] [address]  Culture, blood (Routine x 2)     Status: None (Preliminary result)   Collection Time: 06/03/22  9:32 PM   Specimen: Left Antecubital; Blood  Result Value Ref Range Status   Specimen Description   Final    LEFT ANTECUBITAL Performed at [facility], [address], [place], [place] [address]    Special Requests   Final    BOTTLES DRAWN AEROBIC AND ANAEROBIC Blood Culture adequate volume Performed at [facility], [address] [place], [place] [address]    Culture  Setup Time  Final    AEROBIC BOTTLE ONLY YEAST Gram Stain Report Called to,Read Back By and Verified With: UTE JACOBS @ 1200 ON 06/06/22 C VARNER CRITICAL VALUE NOTED.  VALUE IS CONSISTENT WITH PREVIOUSLY REPORTED AND CALLED VALUE. Performed at [facility], [place] [address], [place], [place] [address]    Culture   Final    NO GROWTH 3 DAYS Performed at [facility], [address], [place], [place] [address]    Report Status PENDING  Incomplete  MRSA Next Gen by PCR, Nasal     Status: None   Collection Time: 06/04/22  2:18 AM   Specimen: Nasal Mucosa; Nasal Swab  Result Value Ref Range Status   MRSA by PCR Next Gen NOT DETECTED NOT DETECTED Final    Comment: (NOTE) The GeneXpert MRSA Assay (FDA approved for NASAL specimens only), is one component of a comprehensive MRSA colonization surveillance program. It is not intended to diagnose MRSA infection nor to guide or monitor treatment for MRSA infections. Test performance is not FDA approved in patients less than 60 years old. Performed at [facility], [address], [place], [place] [address]   Urine Culture     Status: Abnormal   Collection Time: 06/04/22  2:28 AM   Specimen: PATH Cytology Urine  Result Value  Ref Range Status   Specimen Description   Final    URINE, CLEAN CATCH Performed at [facility], [address] [place], [place] [address]    Special Requests   Final    NONE Performed at [facility], [address], [place], [place] [address]    Culture (A)  Final    <10,000 COLONIES/mL INSIGNIFICANT GROWTH Performed at [place] [address], [place], [place] [address]    Report Status 06/05/2022 FINAL  Final  Aerobic/Anaerobic Culture w Gram Stain (surgical/deep wound)     Status: None (Preliminary result)   Collection Time: 06/04/22  3:03 AM   Specimen: PATH Cytology FNA; Body Fluid  Result Value Ref Range Status   Specimen Description   Final    ANAL Performed at [facility], [address] [place], [place] [address]    Special Requests   Final    NONE Performed at [facility], [address] [place], [place] [address]    Gram Stain   Final    MODERATE WBC PRESENT,BOTH PMN AND MONONUCLEAR FEW GRAM POSITIVE COCCI IN PAIRS MODERATE GRAM POSITIVE RODS    Culture   Final    CULTURE REINCUBATED FOR BETTER GROWTH Performed at [facility], [place] [address] [place], [place] [address]    Report Status PENDING  Incomplete  Culture, blood (Routine X 2) w Reflex to ID Panel     Status: None (Preliminary result)   Collection Time: 06/06/22  9:32 AM   Specimen: BLOOD LEFT WRIST  Result Value Ref Range Status   Specimen Description BLOOD LEFT WRIST  Final   Special Requests   Final    BOTTLES DRAWN AEROBIC ONLY Blood Culture results may not be optimal due to an inadequate volume of blood received in culture bottles Performed at [facility], [address] [place], [place] [address]    Culture PENDING  Incomplete   Report Status PENDING  Incomplete  Culture, blood (Routine X 2) w Reflex to ID Panel     Status: None (Preliminary result)   Collection Time: 06/06/22  9:33 AM   Specimen: Left Antecubital; Blood  Result Value Ref Range  Status   Specimen  Description LEFT ANTECUBITAL  Final   Special Requests   Final    BOTTLES DRAWN AEROBIC AND ANAEROBIC Blood Culture adequate volume Performed at [facility], [address] [place], [place] [address]    Culture PENDING  Incomplete   Report Status PENDING  Incomplete     Scheduled Meds:  Chlorhexidine Gluconate Cloth  6 each Topical Daily   heparin  5,000 Units Subcutaneous Q8H   insulin aspart  0-9 Units Subcutaneous Q8H   mouth rinse  15 mL Mouth Rinse Q4H   pantoprazole (PROTONIX) IV  40 mg Intravenous Q12H   Continuous Infusions:  sodium chloride     sodium chloride Stopped (06/06/22 1153)   micafungin (MYCAMINE) 100 mg in sodium chloride 0.9 % 100 mL IVPB 105 mL/hr at 06/06/22 1200   norepinephrine (LEVOPHED) Adult infusion 2 mcg/min (06/06/22 1200)   piperacillin-tazobactam (ZOSYN)  IV Stopped (06/06/22 1018)   propofol (DIPRIVAN) infusion Stopped (06/05/22 0263)    Procedures/Studies: DG CHEST PORT 1 VIEW  Result Date: 06/05/2022 CLINICAL DATA:  76 year old female with respiratory failure. Intubated. EXAM: PORTABLE CHEST 1 VIEW COMPARISON:  Portable chest 06/04/2022 and earlier. FINDINGS: Portable AP semi upright view at 0527 hours. Endotracheal tube tip in good position between the clavicles and carina. Stable left IJ central line and visible enteric tube. Increased left lung base and retrocardiac opacity now mostly obscuring the left hemidiaphragm. Slightly lower lung volumes overall. Mediastinal contours remain normal. No pneumothorax, pulmonary edema, or definite effusion. Negative right lung. Partially visible midline abdominal skin staples. Paucity of bowel gas in the upper abdomen. Stable left chest and axillary surgical clips. IMPRESSION: 1. Stable lines and tubes. 2. Increased left lower lobe collapse or consolidation since yesterday. Electronically Signed   By: Genevie Ann M.D.   On: 06/05/2022 08:26   DG Chest Port 1 View  Result Date: 06/04/2022 CLINICAL DATA:   Endotracheal tube, nasogastric tube and central line placement. EXAM: PORTABLE CHEST 1 VIEW COMPARISON:  June 03, 2022 FINDINGS: Since the prior study there has been interval placement of an endotracheal tube. Its distal tip is seen approximately 3.8 cm from the carina. Interval nasogastric tube placement is also seen. Its distal and extends into the gastric lumen. There is a left internal jugular venous catheter with its distal tip overlying the distal superior vena cava. This is approximately 6 mm proximal to the junction of the superior vena cava and right atrium. The heart size and mediastinal contours are within normal limits. Mild to moderate severity atelectasis and/or infiltrate is seen within the left lung base. This represents a new finding when compared to the prior study. There is no evidence of a pleural effusion or pneumothorax. Radiopaque surgical clips are seen along the left axilla. The air seen below the right hemidiaphragm on the prior study is no longer visualized. The visualized skeletal structures are unremarkable. IMPRESSION: 1. Interval endotracheal tube, nasogastric tube and left internal jugular venous catheter placement positioning, as described above. 2. Mild to moderate severity left basilar atelectasis and/or infiltrate. Electronically Signed   By: Virgina Norfolk M.D.   On: 06/04/2022 02:47   CT Abdomen Pelvis Wo Contrast  Result Date: 06/03/2022 CLINICAL DATA:  Severe weakness for 2 days with acute abdominal pain, initial encounter EXAM: CT ABDOMEN AND PELVIS WITHOUT CONTRAST TECHNIQUE: Multidetector CT imaging of the abdomen and pelvis was performed following the standard protocol without IV contrast. RADIATION DOSE REDUCTION: This exam was performed according to the departmental  dose-optimization program which includes automated exposure control, adjustment of the mA and/or kV according to patient size and/or use of iterative reconstruction technique. COMPARISON:  None  Available. FINDINGS: Lower chest: Basilar atelectatic changes are noted. Hepatobiliary: Liver is within normal limits. Gallbladder demonstrates a normal appearance. Pancreas: Unremarkable. No pancreatic ductal dilatation or surrounding inflammatory changes. Spleen: Normal in size without focal abnormality. Adrenals/Urinary Tract: Adrenal glands are within normal limits. Kidneys demonstrate tiny nonobstructing stones on right in the lower pole. No ureteral obstruction is seen. The bladder is decompressed. Stomach/Bowel: Considerable fecal material is noted within the rectal vault which may represent a focal impaction. Diverticular change of the colon is noted as well as some generalized wall thickening throughout the colon. The appendix is within normal limits. Small bowel is within normal limits. Stomach demonstrates a defect in the anterior gastric wall consistent with a perforated ulcer. There is a considerable amount of air and fluid throughout the abdomen with apparent direct communication with the gastric best seen on image number 36 of series 2 and image number 78 of series 6. Vascular/Lymphatic: Aortic atherosclerosis. No enlarged abdominal or pelvic lymph nodes. Reproductive: Status post hysterectomy. No adnexal masses. Other: Free air and free fluid similar to that described above throughout the abdomen. The bowel demonstrates some reactive wall thickening related to the fluid and air. Musculoskeletal: Degenerative changes of the lumbar spine are seen. No other bony abnormality is noted. IMPRESSION: Changes consistent with a perforated gastric ulcer anteriorly with considerable spillage of fluid and air into the abdominal cavity. Some reactive wall thickening is noted within the bowel loops secondary to these changes. Critical Value/emergent results were called by telephone at the time of interpretation on 06/03/2022 at 10:43 pm to Dr. Aletta Edouard , who verbally acknowledged these results. Electronically  Signed   By: Inez Catalina M.D.   On: 06/03/2022 22:45   DG Chest Port 1 View  Result Date: 06/03/2022 CLINICAL DATA:  Weakness. EXAM: PORTABLE CHEST 1 VIEW COMPARISON:  None Available. FINDINGS: Multiple overlying radiopaque cardiac lead wires are seen. The heart size and mediastinal contours are within normal limits. Mild, diffuse, chronic appearing increased lung markings are seen. There is no evidence of an acute infiltrate, pleural effusion or pneumothorax. Radiopaque surgical clips are seen along the lateral aspect of the left chest wall. A crescentic area of air is seen just below the right hemidiaphragm. Degenerative changes seen throughout the thoracic spine. IMPRESSION: 1. Chronic appearing increased lung markings without evidence of acute or active cardiopulmonary disease. 2. Air seen just below the right hemidiaphragm which may represent an air filled loop of colon. Further evaluation with abdomen pelvis CT is recommended, as the presence of intra-abdominal free air cannot be excluded. Electronically Signed   By: Virgina Norfolk M.D.   On: 06/03/2022 21:22    Orson Eva, DO  [facility]  If 7PM-7AM, please contact night-coverage [email or web address] Password [email or web address] 06/06/2022, 1:26 PM   LOS: 2 days

## 2022-06-06 NOTE — Consult Note (Signed)
[facility]    Date of Admission:  06/03/2022     Reason for Consult: fungemia, septic shock    Referring Provider: Frances Furbish    Lines:  7/02-c left radial a-line 7/02-c foley catheter 7/02-c ngt   Abx: 7/4-c micafungin 7/2-c piptazo  7/2-4 fluconazole        Assessment: Sepsis/shock Intraabdominal abscess from gastric perforation fungemia Metabolic encephalopathy  Aki  Shock resolved. Central catheter ?removed S/p emergent exlap with gastrorrophy/omental patch. Perforation due to gastric ulcer  Admission bcx now grew candida glabrata Operative intraabd culture in progress; gram stain with GPR and gpc in pairs  Repeat bcx in process  Aki sepsis related -- plateaued   (Of note, this is chart review. I am not awared if there are any current central line/indwelling foley catheter -- if present would need to be removed in setting fungemia)  Plan: Tte Remove intravascular catheter if present when feasible If visual disturbance would need urgent ophthalmologic exam to see if involved in setting fungemia, otherwise if no sx could have outpatient exam Started micafungin; stop fluconazole F/u repeat bcx Continue piptazo for intraabd process Please discuss case with surgery -- if source control is achieved 5 more days of piptazo would be adequate. The micafungin will need to be on for 2 weeks to treat fungemia Will need to check c glabrata for fluconazole susceptibility Discussed with primary team    I spent 20 minute reviewing data/chart, and coordinating care and >50% direct face to face time providing counseling/discussing diagnostics/treatment plan with patient   ------------------------------------------------ Principal Problem:   Septic shock ([place]) Active Problems:   Mixed hyperlipidemia   Essential hypertension   Gastric perforation (H[facility])   GERD (gastroesophageal reflux disease)   Acute renal failure  superimposed on stage 3a chronic kidney disease ([place])   Major neurocognitive disorder ([place])   Acute respiratory failure with hypoxia ([place])   Hypomagnesemia    HPI: Carolyn Erickson is a 76 y.o. female admitted with septic shock in setting perfed gastric ulcer with fungemia  Chart reviewed Patient with septic shock on presentation 7/02 S/p emergent exlap with repair On empiric piptazo/fluconazole Aki   Bcx showed c glabrata on 7/04 Wbc rising Shock had resolved  Ucx for sepsis w/u <10k insignificant growth  Family History  Problem Relation Age of Onset   Diabetes Mother    Heart disease Mother    Alzheimer's disease Father    Parkinson's disease Sister    Alzheimer's disease Brother     Social History   Tobacco Use   Smoking status: Never   Smokeless tobacco: Never  Vaping Use   Vaping Use: Never used  Substance Use Topics   Alcohol use: Never   Drug use: Never    No Known Allergies  Review of Systems: ROS All Other ROS was negative, except mentioned above   Past Medical History:  Diagnosis Date   Cancer ([place]) 2001   breast left   Depression    Hypertension        Scheduled Meds:  Chlorhexidine Gluconate Cloth  6 each Topical Daily   heparin  5,000 Units Subcutaneous Q8H   insulin aspart  0-9 Units Subcutaneous Q8H   mouth rinse  15 mL Mouth Rinse Q4H   pantoprazole (PROTONIX) IV  40 mg Intravenous Q12H   Continuous Infusions:  sodium chloride     micafungin (MYCAMINE) 100 mg in sodium chloride 0.9 % 100 mL  IVPB     norepinephrine (LEVOPHED) Adult infusion 2 mcg/min (06/06/22 0900)   piperacillin-tazobactam (ZOSYN)  IV 12.5 mL/hr at 06/06/22 0900   propofol (DIPRIVAN) infusion Stopped (06/05/22 0833)   PRN Meds:.Place/Maintain arterial line **AND** sodium chloride, diphenhydrAMINE **OR** diphenhydrAMINE, morphine injection, ondansetron **OR** ondansetron (ZOFRAN) IV, mouth rinse   OBJECTIVE: Blood pressure 127/65, pulse (!) 112, temperature  97.9 F (36.6 C), temperature source Axillary, resp. rate (!) 28, height '5\' 1"'$  (1.549 m), weight 50.9 kg, SpO2 95 %.  Physical Exam  Reviewed chart   Lab Results Lab Results  Component Value Date   WBC 24.0 (H) 06/06/2022   HGB 10.9 (L) 06/06/2022   HCT 33.7 (L) 06/06/2022   MCV 94.7 06/06/2022   PLT 137 (L) 06/06/2022    Lab Results  Component Value Date   CREATININE 2.05 (H) 06/06/2022   BUN 50 (H) 06/06/2022   NA 134 (L) 06/06/2022   K 4.6 06/06/2022   CL 105 06/06/2022   CO2 24 06/06/2022    Lab Results  Component Value Date   ALT 15 06/06/2022   AST 17 06/06/2022   ALKPHOS 56 06/06/2022   BILITOT 1.8 (H) 06/06/2022      Microbiology: Recent Results (from the past 240 hour(s))  Culture, blood (Routine x 2)     Status: None (Preliminary result)   Collection Time: 06/03/22  8:45 PM   Specimen: Left Antecubital; Blood  Result Value Ref Range Status   Specimen Description   Final    LEFT ANTECUBITAL Performed at [facility], [address], [place], [place] [address]    Special Requests   Final    BOTTLES DRAWN AEROBIC AND ANAEROBIC Blood Culture adequate volume Performed at [facility], [address], [place], [place] [address]    Culture  Setup Time   Final    YEAST BOTH Gram Stain Report Called to,Read Back By and Verified With: JACOBS, U'@0745'$  BY MATTHEWS, B 7.4.2023 Organism ID to follow Performed at [facility], [place] [address] [place], [place] [address]    Culture PENDING  Incomplete   Report Status PENDING  Incomplete  Culture, blood (Routine x 2)     Status: None (Preliminary result)   Collection Time: 06/03/22  9:32 PM   Specimen: Left Antecubital; Blood  Result Value Ref Range Status   Specimen Description LEFT ANTECUBITAL  Final   Special Requests   Final    BOTTLES DRAWN AEROBIC AND ANAEROBIC Blood Culture adequate volume   Culture   Final    NO GROWTH 3 DAYS Performed at [facility] [address] [place], [place] [address]     Report Status PENDING  Incomplete  MRSA Next Gen by PCR, Nasal     Status: None   Collection Time: 06/04/22  2:18 AM   Specimen: Nasal Mucosa; Nasal Swab  Result Value Ref Range Status   MRSA by PCR Next Gen NOT DETECTED NOT DETECTED Final    Comment: (NOTE) The GeneXpert MRSA Assay (FDA approved for NASAL specimens only), is one component of a comprehensive MRSA colonization surveillance program. It is not intended to diagnose MRSA infection nor to guide or monitor treatment for MRSA infections. Test performance is not FDA approved in patients less than 2 years old. Performed at [facility], [address] [place], [place] [address]   Urine Culture     Status: Abnormal   Collection Time: 06/04/22  2:28 AM   Specimen: PATH Cytology Urine  Result Value Ref  Range Status   Specimen Description   Final    URINE, CLEAN CATCH Performed at  [facility], [address], [place], [place] [address]    Special Requests   Final    NONE Performed at [facility], [address] [place], [place] [address]    Culture (A)  Final    <10,000 COLONIES/mL INSIGNIFICANT GROWTH Performed at [facility] [address], [place], [place] [address]    Report Status 06/05/2022 FINAL  Final  Aerobic/Anaerobic Culture w Gram Stain (surgical/deep wound)     Status: None (Preliminary result)   Collection Time: 06/04/22  3:03 AM   Specimen: PATH Cytology FNA; Body Fluid  Result Value Ref Range Status   Specimen Description   Final    ANAL Performed at [facility], [address], [place], [place] [address]    Special Requests   Final    NONE Performed at [facility], [address] [place], [place] [address]    Gram Stain   Final    MODERATE WBC PRESENT,BOTH PMN AND MONONUCLEAR FEW GRAM POSITIVE COCCI IN PAIRS MODERATE GRAM POSITIVE RODS    Culture   Final    CULTURE REINCUBATED FOR BETTER GROWTH Performed at [facility], [place] [address] [place], [place] [address]    Report  Status PENDING  Incomplete  Culture, blood (Routine X 2) w Reflex to ID Panel     Status: None (Preliminary result)   Collection Time: 06/06/22  9:32 AM   Specimen: BLOOD LEFT WRIST  Result Value Ref Range Status   Specimen Description BLOOD LEFT WRIST  Final   Special Requests   Final    BOTTLES DRAWN AEROBIC ONLY Blood Culture results may not be optimal due to an inadequate volume of blood received in culture bottles Performed at [facility], [address], [place],  [address]    Culture PENDING  Incomplete   Report Status PENDING  Incomplete  Culture, blood (Routine X 2) w Reflex to ID Panel     Status: None (Preliminary result)   Collection Time: 06/06/22  9:33 AM   Specimen: Left Antecubital; Blood  Result Value Ref Range Status   Specimen Description LEFT ANTECUBITAL  Final   Special Requests   Final    BOTTLES DRAWN AEROBIC AND ANAEROBIC Blood Culture adequate volume Performed at [facility], [address], [place],  [address]    Culture PENDING  Incomplete   Report Status PENDING  Incomplete     Serology:    Imaging: If present, new imagings (plain films, ct scans, and mri) have been personally visualized and interpreted; radiology reports have been reviewed. Decision making incorporated into the Impression / Recommendations.  7/1 abd ct Changes consistent with a perforated gastric ulcer anteriorly with considerable spillage of fluid and air into the abdominal cavity. Some reactive wall thickening is noted within the bowel loops secondary to these changes.   Critical Value/emergent results were called by telephone at the time of interpretation on 06/03/2022 at 10:43 pm to Dr. Aletta Edouard , who verbally acknowledged these results.    Jabier Mutton, [facility] [place] [phone] pager    06/06/2022, 10:32 AM

## 2022-06-06 NOTE — Progress Notes (Signed)
PHARMACY - PHYSICIAN COMMUNICATION CRITICAL VALUE ALERT - BLOOD CULTURE IDENTIFICATION (BCID)  Carolyn Erickson is an 76 y.o. female who presented to [facility] on 06/03/2022   Assessment:  candida glabrata bacteremia  Name of physician (or Provider) Contacted Dr Tat  Current antibiotics: Micafungin 100 mg IV every 24 hours.  Changes to prescribed antibiotics recommended:  Patient is on recommended antibiotics - No changes needed  Results for orders placed or performed during the hospital encounter of 06/03/22  Blood Culture ID Panel (Reflexed) (Collected: 06/03/2022  8:45 PM)  Result Value Ref Range   Enterococcus faecalis NOT DETECTED NOT DETECTED   Enterococcus Faecium NOT DETECTED NOT DETECTED   Listeria monocytogenes NOT DETECTED NOT DETECTED   Staphylococcus species NOT DETECTED NOT DETECTED   Staphylococcus aureus (BCID) NOT DETECTED NOT DETECTED   Staphylococcus epidermidis NOT DETECTED NOT DETECTED   Staphylococcus lugdunensis NOT DETECTED NOT DETECTED   Streptococcus species NOT DETECTED NOT DETECTED   Streptococcus agalactiae NOT DETECTED NOT DETECTED   Streptococcus pneumoniae NOT DETECTED NOT DETECTED   Streptococcus pyogenes NOT DETECTED NOT DETECTED   A.calcoaceticus-baumannii NOT DETECTED NOT DETECTED   Bacteroides fragilis NOT DETECTED NOT DETECTED   Enterobacterales NOT DETECTED NOT DETECTED   Enterobacter cloacae complex NOT DETECTED NOT DETECTED   Escherichia coli NOT DETECTED NOT DETECTED   Klebsiella aerogenes NOT DETECTED NOT DETECTED   Klebsiella oxytoca NOT DETECTED NOT DETECTED   Klebsiella pneumoniae NOT DETECTED NOT DETECTED   Proteus species NOT DETECTED NOT DETECTED   Salmonella species NOT DETECTED NOT DETECTED   Serratia marcescens NOT DETECTED NOT DETECTED   Haemophilus influenzae NOT DETECTED NOT DETECTED   Neisseria meningitidis NOT DETECTED NOT DETECTED   Pseudomonas aeruginosa NOT DETECTED NOT DETECTED   Stenotrophomonas maltophilia NOT  DETECTED NOT DETECTED   Candida albicans NOT DETECTED NOT DETECTED   Candida auris NOT DETECTED NOT DETECTED   Candida glabrata DETECTED (A) NOT DETECTED   Candida krusei NOT DETECTED NOT DETECTED   Candida parapsilosis NOT DETECTED NOT DETECTED   Candida tropicalis NOT DETECTED NOT DETECTED   Cryptococcus neoformans/gattii NOT DETECTED NOT DETECTED    Ramond Craver 06/06/2022  1:32 PM

## 2022-06-06 NOTE — Progress Notes (Signed)
2 Days Post-Op  Subjective: Patient comfortable and has no significant complaints.  She does appear fatigued.  Objective: Vital signs in last 24 hours: Temp:  [97.8 F (36.6 C)-99.1 F (37.3 C)] 97.9 F (36.6 C) (07/04 0740) Pulse Rate:  [52-121] 112 (07/04 0900) Resp:  [11-28] 28 (07/04 0900) BP: (100-131)/(44-65) 127/65 (07/04 0900) SpO2:  [90 %-100 %] 95 % (07/04 0900) FiO2 (%):  [40 %] 40 % (07/03 1122) Last BM Date :  (None during admission)  Intake/Output from previous day: 07/03 0701 - 07/04 0700 In: 4548.6 [I.V.:4327.4; IV Piggyback:221.3] Out: 1445 [Urine:1225; Emesis/NG output:150; Drains:70] Intake/Output this shift: Total I/O In: 1126.7 [I.V.:1039; IV Piggyback:87.7] Out: 300 [Urine:300]  General appearance: alert, cooperative, and fatigued Resp: clear to auscultation bilaterally Cardio: regular rate and rhythm, S1, S2 normal, no murmur, click, rub or gallop GI: Soft with minimal bowel sounds appreciated.  JP drainage clear.  No bile present.  Incision healing well.  Lab Results:  Recent Labs    06/05/22 0357 06/06/22 0352  WBC 22.2* 24.0*  HGB 12.0 10.9*  HCT 36.1 33.7*  PLT 175 137*   BMET Recent Labs    06/05/22 0357 06/06/22 0352  NA 131* 134*  K 4.6 4.6  CL 103 105  CO2 22 24  GLUCOSE 187* 125*  BUN 48* 50*  CREATININE 2.28* 2.05*  CALCIUM 7.6* 7.8*   PT/INR Recent Labs    06/03/22 2045  LABPROT 15.8*  INR 1.3*    Studies/Results: DG CHEST PORT 1 VIEW  Result Date: 06/05/2022 CLINICAL DATA:  76 year old female with respiratory failure. Intubated. EXAM: PORTABLE CHEST 1 VIEW COMPARISON:  Portable chest 06/04/2022 and earlier. FINDINGS: Portable AP semi upright view at 0527 hours. Endotracheal tube tip in good position between the clavicles and carina. Stable left IJ central line and visible enteric tube. Increased left lung base and retrocardiac opacity now mostly obscuring the left hemidiaphragm. Slightly lower lung volumes overall.  Mediastinal contours remain normal. No pneumothorax, pulmonary edema, or definite effusion. Negative right lung. Partially visible midline abdominal skin staples. Paucity of bowel gas in the upper abdomen. Stable left chest and axillary surgical clips. IMPRESSION: 1. Stable lines and tubes. 2. Increased left lower lobe collapse or consolidation since yesterday. Electronically Signed   By: Genevie Ann M.D.   On: 06/05/2022 08:26    Anti-infectives: Anti-infectives (From admission, onward)    Start     Dose/Rate Route Frequency Ordered Stop   06/07/22 0200  fluconazole (DIFLUCAN) IVPB 200 mg        200 mg 100 mL/hr over 60 Minutes Intravenous Every 24 hours 06/06/22 0901     06/05/22 1800  piperacillin-tazobactam (ZOSYN) IVPB 3.375 g        3.375 g 12.5 mL/hr over 240 Minutes Intravenous Every 12 hours 06/05/22 0825 06/08/22 2359   06/04/22 0600  piperacillin-tazobactam (ZOSYN) IVPB 3.375 g  Status:  Discontinued        3.375 g 12.5 mL/hr over 240 Minutes Intravenous Every 8 hours 06/04/22 0219 06/05/22 0825   06/04/22 0315  fluconazole (DIFLUCAN) IVPB 200 mg  Status:  Discontinued        200 mg 100 mL/hr over 60 Minutes Intravenous Every 24 hours 06/04/22 0217 06/04/22 0219   06/04/22 0315  fluconazole (DIFLUCAN) IVPB 200 mg  Status:  Discontinued        200 mg 100 mL/hr over 60 Minutes Intravenous Every 24 hours 06/04/22 0219 06/06/22 0901   06/03/22 2130  ceFEPIme (MAXIPIME) 2  g in sodium chloride 0.9 % 100 mL IVPB        2 g 200 mL/hr over 30 Minutes Intravenous  Once 06/03/22 2127 06/03/22 2209   06/03/22 2130  metroNIDAZOLE (FLAGYL) IVPB 500 mg  Status:  Discontinued        500 mg 100 mL/hr over 60 Minutes Intravenous Every 12 hours 06/03/22 2127 06/04/22 0217       Assessment/Plan: s/p Procedure(s): EXPLORATORY LAPAROTOMY, omental patch Impression: Postoperative day 2.  Patient still on Levophed drip at 2 mcg.  Patient has a positive blood culture from the time of admission prior  to surgery for a fungus.  Patient has been on Diflucan.  Repeat blood cultures are pending.  Because of the fungemia present prior to surgical intervention, the central line will need to be removed.  This is not a central line introduced infection.  TPN will be held.  Will give patient a central line holiday for 24 hours and then will have a PICC line placed.  Discussed with Dr. Carles Collet.  LOS: 2 days    Aviva Signs 06/06/2022

## 2022-06-06 NOTE — Assessment & Plan Note (Addendum)
-  06/03/22 blood cultures--Candida glabrata -fluconazole discontinued per ID consult -patient completed echinocandin per ID (14 days after blood cultures without growth; last antifungal therapy given on 06/20/2022). -Patient has remained afebrile.

## 2022-06-07 ENCOUNTER — Inpatient Hospital Stay (HOSPITAL_COMMUNITY): Payer: Medicare PPO

## 2022-06-07 ENCOUNTER — Encounter (HOSPITAL_COMMUNITY): Payer: Self-pay | Admitting: General Surgery

## 2022-06-07 ENCOUNTER — Encounter: Payer: Self-pay | Admitting: Internal Medicine

## 2022-06-07 DIAGNOSIS — K255 Chronic or unspecified gastric ulcer with perforation: Secondary | ICD-10-CM | POA: Diagnosis not present

## 2022-06-07 DIAGNOSIS — R7881 Bacteremia: Secondary | ICD-10-CM

## 2022-06-07 DIAGNOSIS — N179 Acute kidney failure, unspecified: Secondary | ICD-10-CM | POA: Diagnosis not present

## 2022-06-07 DIAGNOSIS — B49 Unspecified mycosis: Secondary | ICD-10-CM | POA: Diagnosis not present

## 2022-06-07 DIAGNOSIS — A419 Sepsis, unspecified organism: Secondary | ICD-10-CM | POA: Diagnosis not present

## 2022-06-07 LAB — COMPREHENSIVE METABOLIC PANEL
ALT: 14 U/L (ref 0–44)
AST: 17 U/L (ref 15–41)
Albumin: <1.5 g/dL — ABNORMAL LOW (ref 3.5–5.0)
Alkaline Phosphatase: 74 U/L (ref 38–126)
Anion gap: 5 (ref 5–15)
BUN: 43 mg/dL — ABNORMAL HIGH (ref 8–23)
CO2: 24 mmol/L (ref 22–32)
Calcium: 8.1 mg/dL — ABNORMAL LOW (ref 8.9–10.3)
Chloride: 108 mmol/L (ref 98–111)
Creatinine, Ser: 1.81 mg/dL — ABNORMAL HIGH (ref 0.44–1.00)
GFR, Estimated: 28 mL/min — ABNORMAL LOW (ref >60)
Glucose, Bld: 125 mg/dL — ABNORMAL HIGH (ref 70–99)
Potassium: 4.4 mmol/L (ref 3.5–5.1)
Sodium: 137 mmol/L (ref 135–145)
Total Bilirubin: 2.4 mg/dL — ABNORMAL HIGH (ref 0.3–1.2)
Total Protein: 4.1 g/dL — ABNORMAL LOW (ref 6.5–8.1)

## 2022-06-07 LAB — CBC
HCT: 37.6 % (ref 36.0–46.0)
Hemoglobin: 11.8 g/dL — ABNORMAL LOW (ref 12.0–15.0)
MCH: 29.6 pg (ref 26.0–34.0)
MCHC: 31.4 g/dL (ref 30.0–36.0)
MCV: 94.5 fL (ref 80.0–100.0)
Platelets: 121 10*3/uL — ABNORMAL LOW (ref 150–400)
RBC: 3.98 MIL/uL (ref 3.87–5.11)
RDW: 13.2 % (ref 11.5–15.5)
WBC: 25.2 10*3/uL — ABNORMAL HIGH (ref 4.0–10.5)
nRBC: 0.3 % — ABNORMAL HIGH (ref 0.0–0.2)

## 2022-06-07 LAB — ECHOCARDIOGRAM COMPLETE
AR max vel: 1.92 cm2
AV Area VTI: 2.02 cm2
AV Area mean vel: 1.84 cm2
AV Mean grad: 5 mmHg
AV Peak grad: 10.3 mmHg
Ao pk vel: 1.61 m/s
Area-P 1/2: 6.02 cm2
Calc EF: 58.1 %
Height: 61 in
MV VTI: 2.25 cm2
S' Lateral: 3 cm
Single Plane A2C EF: 60.4 %
Single Plane A4C EF: 54.8 %
Weight: 1795.43 oz

## 2022-06-07 LAB — GLUCOSE, CAPILLARY
Glucose-Capillary: 101 mg/dL — ABNORMAL HIGH (ref 70–99)
Glucose-Capillary: 105 mg/dL — ABNORMAL HIGH (ref 70–99)
Glucose-Capillary: 84 mg/dL (ref 70–99)
Glucose-Capillary: 98 mg/dL (ref 70–99)

## 2022-06-07 NOTE — Progress Notes (Addendum)
ID Brief Note   Chart reviewed, patient not seen in person   Remains afebrile  WBC up to 25 , plts down to 121  TB bili up to 2.4, AST and ALT wnl , albumin <1.5 CR down to 1.8 On levophed, central line seems to have removed yesterday ( less likely CVC the source as blood cx positive from 7/1 and CVC seems to have placed 7/2)  Repeat blood cx 7/4 NG in less than 12 hrs. Sensi of candida glabrata has been sent per ID pharm  TTE 06/06/22 findings noted.   Recommendations - Continue Micafungin and Pip/tazo - Fu repeat blood cx and OR cx  - Recommend TEE to r/o fungal endocarditis although candidemia could be due to abdominal source given multiple valvular abnormalities  - Ophthalmology exam is indicated in candidemia to r/o endopthalmitis, can be done in non urgent basis unless acute visual changes - Hold off on PICC line placement pending clearance of repeat blood cultures on 7/4 - Monitor CBC, CMP   Secure chatted Dr Constance Haw   Rosiland Oz, MD Infectious Disease Physician [facility] [address] [place], [place] [address] Phone: [phone]  Fax: [phone]

## 2022-06-07 NOTE — Progress Notes (Signed)
Nutritoin   DOCUMENTATION CODES:      INTERVENTION:  TPN- on hold for line holiday  When pt is cleared for diet will start ONS  NUTRITION DIAGNOSIS:   Increased nutrient needs related to post-op healing as evidenced by estimated needs.  GOAL:  Patient will meet greater than or equal to 90% of their needs  MONITOR:  Labs, I & O's, Skin, Weight trends  ASSESSMENT:  Patient is a 76 yo female with gastric ulcer perforation with peritonitis, septic shock. Status post exploratory laparotomy and omental patch. J/P drain.   7/1 blood cultures - C. glabrata 7/3 extubated  7/5 Patient NPO with NGT (18 Fr-right nare) external length of tube 57 cm. TPN on hold for line holiday.Talked with nursing - patient expressed feeling hungry today. Multiple family member are bedside today. Nutrition needs re-assessed.  Stable weight history 49-50 kg x 6 months.   Medications: insulin, protonix.  IVF-D5-NS @ 100 ml/hr- discontinued at 1714 today.  Intake/Output Summary (Last 24 hours) at 06/07/2022 1624 Last data filed at 06/07/2022 1000 Gross per 24 hour  Intake 1930.31 ml  Output 890 ml  Net 1040.31 ml   Since admission-+13.284 Liters     Latest Ref Rng & Units 06/07/2022    4:12 AM 06/06/2022    3:52 AM 06/05/2022    3:57 AM  BMP  Glucose 70 - 99 mg/dL 125  125  187   BUN 8 - 23 mg/dL 43  50  48   Creatinine 0.44 - 1.00 mg/dL 1.81  2.05  2.28   Sodium 135 - 145 mmol/L 137  134  131   Potassium 3.5 - 5.1 mmol/L 4.4  4.6  4.6   Chloride 98 - 111 mmol/L 108  105  103   CO2 22 - 32 mmol/L '24  24  22   '$ Calcium 8.9 - 10.3 mg/dL 8.1  7.8  7.6       NUTRITION - FOCUSED PHYSICAL EXAM: Partial Nutrition-Focused physical exam completed. Findings are no fat depletion, mild clavicle muscle depletion, and mild edema.     Diet Order:   Diet Order             Diet NPO time specified Except for: Ice Chips  Diet effective now                   EDUCATION NEEDS:  Not appropriate for  education at this time  Skin:  Skin Assessment: Skin Integrity Issues: Skin Integrity Issues:: Incisions Incisions: abdomen with honeycomb dressing  Last BM:  unknown  Height:   Ht Readings from Last 1 Encounters:  06/04/22 '5\' 1"'$  (1.549 m)    Weight:   Wt Readings from Last 1 Encounters:  06/04/22 50.9 kg    Ideal Body Weight:   48 kg  BMI:  Body mass index is 21.2 kg/m.  Estimated Nutritional Needs:   Kcal:  1400-1600  Protein:  75-80  Fluid:  1600 ml daily  Colman Cater MS,RD,CSG,LDN Contact: Shea Evans

## 2022-06-07 NOTE — Telephone Encounter (Signed)
Left message to call back  

## 2022-06-07 NOTE — Progress Notes (Signed)
[facility]  Discussed gastric perforation, UGI and request for TEE from ID, Dr. Alphonsa Gin, with Cardiology Dr. Johnsie Cancel and Dr. Wynetta Emery.   Dr. Johnsie Cancel says part of their procedure is transgastric and the scope is blind. Given this prob is best to hold on the TEE. Dr. Alphonsa Gin made aware of this issue, and is fine with holding on the TEE For a few weeks at this time.  Curlene Labrum, MD [facility] [address] [place], [place] [address] [phone] (office)

## 2022-06-07 NOTE — Progress Notes (Signed)
Carolyn Erickson:[id number] DOB: 1946/09/08 DOA: 06/03/2022 PCP: Dettinger, Fransisca Kaufmann, MD  Brief History:  76 year old female with history of major neurocognitive disorder, hypertension, hyperlipidemia, depression, left breast cancer presenting with generalized weakness and abdominal pain.  Apparently, the patient has been having abdominal pain since April 2023.  The patient had been started on pantoprazole at that time.  There was no improvement.  She followed up with her PCP on 06/02/2022.  Referral was given to see gastroenterology.  Abdominal pain had worsened over the past 2 days prior to admission with worsening generalized weakness.  There is no fevers, chills, chest pain, shortness breath, vomiting, diarrhea.  There is no hematochezia or melena noted. In the emergency department, the patient was hypotensive with a blood pressure of 71/54.  She was somnolent.  CT of the abdomen and pelvis was performed and showed changes consistent with a perforated gastric ulcer.  Dr. Constance Haw was consulted, and the patient was taken to the OR for exploratory laparotomy, gastrorrhaphy, omental patch.  A left IJ central line was also placed.  TRH is consulted for medical management In the ED, the patient had low-grade fever 99.5 F.  She was tachycardic and hypotensive with blood pressure 68/53.  WBC 4.1, hemoglobin 14.6, platelets 308,000.  BMP showed sodium 137, potassium 4.1, bicarbonate 22, serum creatinine 2.14.  Postoperatively, the patient was started on Zosyn and fluconazole.  She remained on the ventilator.  Preoperative chest x-ray showed bibasilar atelectasis.  There was free air under the diaphragm.     Assessment and Plan: * Septic shock ([place]) Presented with hypotension, tachycardia, and lactic acid peaking at 6.8 Currently on Levophed>> wean as tolerated for SBP greater 90 ---currently at 2 mcg/kg/min Secondary to gastric perforation with peritonitis 06/03/22 blood  cultures--C. glabrata UA 11-20 WBC>>urine culture unrevealing Continue Zosyn disContinued fluconazole per ID, continue echinocandin (micafungin) WBC up>>partly demargination from vasopressor vs spurious CVP 7-8>>12-14  Fungemia 06/03/22 blood cultures--Candida glabrata -d/c fluconazole per ID consult -continue echinocandin -discussed with general surgery>>removed central line 7/4 for line holiday -will need at least 24 hours central line holiday -repeat blood culture on 7/4 with no growth to date   Hypomagnesemia Repleting  Acute respiratory failure with hypoxia (HCC) Currently PRVC, RR 20, Vt 380, FiO2 0.4 -7/3 personally reviewed CXR--increased interstitial marking -7/3 ABG--7.35/36/107/20 (0.4) -7/3--extubated -7/4-5--stable on 4L [place]  Major neurocognitive disorder [facility]) Patient had MoCa 17/30 on 03/14/22 -Restart Aricept once stable and clinically -At risk for hospital delirium  Acute renal failure superimposed on stage 3a chronic kidney disease ([place]) Baseline creatinine 1.0-1.1 -Serum creatinine peaking 2.28 -Secondary to hemodynamic changes/sepsis and hypovolemia -Monitor serial BMPs  GERD (gastroesophageal reflux disease) Continue pantoprazole IV twice daily  Gastric perforation ([place]) Suspect this may be due to chronic NSAID use -Continue PPI twice daily -06/04/2022--ex laparotomy with omental patch -Postoperative care per Dr. Constance Haw -TNA therapy on hold temporarily due to need for CVL holiday -planning UGI 7/6  Essential hypertension Holding amlodipine secondary to hypotension  Family Communication:   Family at bedside 7/4, 7/5  Consultants:  general surgery  Code Status:  FULL   DVT Prophylaxis:  SCDs  Procedures: As Listed in Carolyn Note Above  Antibiotics: Fluconazole 7/2>>7/4 Micafungin 7/4>> Zosyn 7/2>>7/6  Subjective: Very hard of hearing, no specific complaints today.   Objective: Vitals:   06/07/22 1000 06/07/22 1015 06/07/22 1030  06/07/22 1117  BP: 138/66 (!) 119/57 (!) 119/56 Carolyn Erickson)  121/59  Pulse: (!) 109 (!) 102 94 (!) 101  Resp: '17 20 16 19  '$ Temp:    98.1 F (36.7 C)  TempSrc:    Oral  SpO2: 95% 100% 100% 99%  Weight:      Height:        Intake/Output Summary (Last 24 hours) at 06/07/2022 1207 Last data filed at 06/07/2022 1000 Gross per 24 hour  Intake 2241.46 ml  Output 890 ml  Net 1351.46 ml   Weight change:  Exam:  General:  remains somnolent, hard of hearing, NAD, cooperative. HEENT: No icterus, No thrush, No neck mass, [place]/AT Cardiovascular: normal s1,s2 sounds, no m/r/g heard. Respiratory: bilateral bS clear, good air movement, no rales, wheezing heard. Abdomen: soft, nondistended, no masses palpated. Normal BS Extremities: no clubbing or cyanosis, trace pretibial edema.  Data Reviewed: I have personally reviewed following labs and imaging studies Basic Metabolic Panel: Recent Labs  Lab 06/03/22 2045 06/03/22 2153 06/04/22 0444 06/05/22 0357 06/06/22 0352 06/07/22 0412  NA 137 138 137 131* 134* 137  K 4.1 3.9 4.0 4.6 4.6 4.4  CL 100 101 108 103 105 108  CO2 22  --  '23 22 24 24  '$ GLUCOSE 130* 117* 124* 187* 125* 125*  BUN 36* 33* 36* 48* 50* 43*  CREATININE 2.14* 2.10* 1.64* 2.28* 2.05* 1.81*  CALCIUM 9.6  --  8.3* 7.6* 7.8* 8.1*  MG  --   --  1.3* 2.1 2.1  --   PHOS  --   --  3.5 4.5 4.5  --    Liver Function Tests: Recent Labs  Lab 06/03/22 2045 06/05/22 0357 06/06/22 0352 06/07/22 0412  AST '27 20 17 17  '$ ALT '25 19 15 14  '$ ALKPHOS 62 46 56 74  BILITOT 1.8* 1.7* 1.8* 2.4*  PROT 6.9 4.0* 3.9* 4.1*  ALBUMIN 3.2* 1.5* <1.5* <1.5*   Recent Labs  Lab 06/03/22 2133  LIPASE 24   No results for input(s): "AMMONIA" in the last 168 hours. Coagulation Profile: Recent Labs  Lab 06/03/22 2045  INR 1.3*   CBC: Recent Labs  Lab 06/03/22 2045 06/03/22 2153 06/04/22 0444 06/05/22 0357 06/06/22 0352 06/07/22 0412  WBC 4.1  --  4.8 22.2* 24.0* 25.2*  NEUTROABS 2.9  --  2.5   --  20.5*  --   HGB 14.6 13.6 12.4 12.0 10.9* 11.8*  HCT 46.4* 40.0 38.4 36.1 33.7* 37.6  MCV 95.3  --  94.1 92.3 94.7 94.5  PLT 308  --  249 175 137* 121*   Cardiac Enzymes: No results for input(s): "CKTOTAL", "CKMB", "CKMBINDEX", "TROPONINI" in the last 168 hours. BNP: Invalid input(s): "POCBNP" CBG: Recent Labs  Lab 06/06/22 0744 06/06/22 1612 06/06/22 1633 06/07/22 0028 06/07/22 0756  GLUCAP 135* 68* 106* 105* 101*   HbA1C: No results for input(s): "HGBA1C" in the last 72 hours. Urine analysis:    Component Value Date/Time   COLORURINE AMBER (A) 06/04/2022 0520   APPEARANCEUR CLOUDY (A) 06/04/2022 0520   APPEARANCEUR Cloudy (A) 04/13/2021 1530   LABSPEC 1.027 06/04/2022 0520   PHURINE 5.0 06/04/2022 0520   GLUCOSEU NEGATIVE 06/04/2022 0520   HGBUR NEGATIVE 06/04/2022 0520   BILIRUBINUR NEGATIVE 06/04/2022 0520   BILIRUBINUR Negative 04/13/2021 1530   KETONESUR NEGATIVE 06/04/2022 0520   PROTEINUR 100 (A) 06/04/2022 0520   NITRITE NEGATIVE 06/04/2022 0520   LEUKOCYTESUR SMALL (A) 06/04/2022 0520    Recent Results (from the past 240 hour(s))  Cath Tip Culture  Status: None (Preliminary result)   Collection Time: 06/03/22  8:07 PM   Specimen: Catheter Tip  Result Value Ref Range Status   Specimen Description CATH TIP  Final   Special Requests NONE  Final   Culture   Final    NO GROWTH < 24 HOURS Performed at [facility], [place] [address] [place], [place] [address]    Report Status PENDING  Incomplete  Culture, blood (Routine x 2)     Status: Abnormal (Preliminary result)   Collection Time: 06/03/22  8:45 PM   Specimen: BLOOD  Result Value Ref Range Status   Specimen Description   Final    BLOOD LEFT ANTECUBITAL Performed at [facility], [place] [address], [place], [place] [address]    Special Requests   Final    BOTTLES DRAWN AEROBIC AND ANAEROBIC Blood Culture adequate volume Performed at [facility], [address], [place], [place]  [address]    Culture  Setup Time   Final    YEAST BOTH Gram Stain Report Called to,Read Back By and Verified With: JACOBS, U'@0745'$  BY MATTHEWS, B 7.4.2023 CRITICAL RESULT CALLED TO, READ BACK BY AND VERIFIED WITH: PHARMD S.HURTZ AT 1130 ON 06/06/2022 BY T.SAAD.    Culture (A)  Final    CANDIDA GLABRATA CULTURE REINCUBATED FOR BETTER GROWTH Performed at [facility], [place] [address] [place], [place] [address]    Report Status PENDING  Incomplete  Blood Culture ID Panel (Reflexed)     Status: Abnormal   Collection Time: 06/03/22  8:45 PM  Result Value Ref Range Status   Enterococcus faecalis NOT DETECTED NOT DETECTED Final   Enterococcus Faecium NOT DETECTED NOT DETECTED Final   Listeria monocytogenes NOT DETECTED NOT DETECTED Final   Staphylococcus species NOT DETECTED NOT DETECTED Final   Staphylococcus aureus (BCID) NOT DETECTED NOT DETECTED Final   Staphylococcus epidermidis NOT DETECTED NOT DETECTED Final   Staphylococcus lugdunensis NOT DETECTED NOT DETECTED Final   Streptococcus species NOT DETECTED NOT DETECTED Final   Streptococcus agalactiae NOT DETECTED NOT DETECTED Final   Streptococcus pneumoniae NOT DETECTED NOT DETECTED Final   Streptococcus pyogenes NOT DETECTED NOT DETECTED Final   A.calcoaceticus-baumannii NOT DETECTED NOT DETECTED Final   Bacteroides fragilis NOT DETECTED NOT DETECTED Final   Enterobacterales NOT DETECTED NOT DETECTED Final   Enterobacter cloacae complex NOT DETECTED NOT DETECTED Final   Escherichia coli NOT DETECTED NOT DETECTED Final   Klebsiella aerogenes NOT DETECTED NOT DETECTED Final   Klebsiella oxytoca NOT DETECTED NOT DETECTED Final   Klebsiella pneumoniae NOT DETECTED NOT DETECTED Final   Proteus species NOT DETECTED NOT DETECTED Final   Salmonella species NOT DETECTED NOT DETECTED Final   Serratia marcescens NOT DETECTED NOT DETECTED Final   Haemophilus influenzae NOT DETECTED NOT DETECTED Final   Neisseria meningitidis NOT DETECTED  NOT DETECTED Final   Pseudomonas aeruginosa NOT DETECTED NOT DETECTED Final   Stenotrophomonas maltophilia NOT DETECTED NOT DETECTED Final   Candida albicans NOT DETECTED NOT DETECTED Final   Candida auris NOT DETECTED NOT DETECTED Final   Candida glabrata DETECTED (A) NOT DETECTED Final    Comment: CRITICAL RESULT CALLED TO, READ BACK BY AND VERIFIED WITH: PHARMD S.HURTZ AT 1130 ON 06/06/2022 BY T.SAAD.    Candida krusei NOT DETECTED NOT DETECTED Final   Candida parapsilosis NOT DETECTED NOT DETECTED Final   Candida tropicalis NOT DETECTED NOT DETECTED Final   Cryptococcus neoformans/gattii NOT DETECTED NOT DETECTED Final  Comment: Performed at [facility], [place] [address], [place], [place] [address]  Culture, blood (Routine x 2)     Status: None (Preliminary result)   Collection Time: 06/03/22  9:32 PM   Specimen: Left Antecubital; Blood  Result Value Ref Range Status   Specimen Description   Final    LEFT ANTECUBITAL Performed at [facility], [address], [place], [place] [address]    Special Requests   Final    BOTTLES DRAWN AEROBIC AND ANAEROBIC Blood Culture adequate volume Performed at [facility], [address] [place], [place] [address]    Culture  Setup Time   Final    AEROBIC BOTTLE ONLY YEAST Gram Stain Report Called to,Read Back By and Verified With: UTE JACOBS @ 1200 ON 06/06/22 C VARNER CRITICAL VALUE NOTED.  VALUE IS CONSISTENT WITH PREVIOUSLY REPORTED AND CALLED VALUE.    Culture   Final    YEAST CULTURE REINCUBATED FOR BETTER GROWTH Performed at [facility], [place] [address], [place], [place] [address]    Report Status PENDING  Incomplete  MRSA Next Gen by PCR, Nasal     Status: None   Collection Time: 06/04/22  2:18 AM   Specimen: Nasal Mucosa; Nasal Swab  Result Value Ref Range Status   MRSA by PCR Next Gen NOT DETECTED NOT DETECTED Final    Comment: (NOTE) The GeneXpert MRSA Assay (FDA approved for NASAL specimens only), is one  component of a comprehensive MRSA colonization surveillance program. It is not intended to diagnose MRSA infection nor to guide or monitor treatment for MRSA infections. Test performance is not FDA approved in patients less than 27 years old. Performed at [facility], [address] [place], [place] [address]   Urine Culture     Status: Abnormal   Collection Time: 06/04/22  2:28 AM   Specimen: PATH Cytology Urine  Result Value Ref Range Status   Specimen Description   Final    URINE, CLEAN CATCH Performed at [facility], [address], [place], [place] [address]    Special Requests   Final    NONE Performed at [facility], [address], [place], [place] [address]    Culture (A)  Final    <10,000 COLONIES/mL INSIGNIFICANT GROWTH Performed at [place] [address] [place], [place] [address]    Report Status 06/05/2022 FINAL  Final  Aerobic/Anaerobic Culture w Gram Stain (surgical/deep wound)     Status: None (Preliminary result)   Collection Time: 06/04/22  3:03 AM   Specimen: PATH Cytology FNA; Body Fluid  Result Value Ref Range Status   Specimen Description   Final    ANAL Performed at [facility], [address] [place], [place] [address]    Special Requests   Final    NONE Performed at [facility], [address] [place], [place] [address]    Gram Stain   Final    MODERATE WBC PRESENT,BOTH PMN AND MONONUCLEAR FEW GRAM POSITIVE COCCI IN PAIRS MODERATE GRAM POSITIVE RODS Performed at [facility], [place] [address], [place], [place] [address]    Culture   Final    CULTURE REINCUBATED FOR BETTER GROWTH NO ANAEROBES ISOLATED; CULTURE IN Carolyn FOR 5 DAYS    Report Status PENDING  Incomplete  Culture, blood (Routine X 2) w Reflex to ID Panel     Status: None (Preliminary result)   Collection Time: 06/06/22  9:32 AM   Specimen: BLOOD LEFT  WRIST  Result Value Ref Range Status   Specimen Description BLOOD LEFT WRIST  Final   Special Requests    Final    BOTTLES DRAWN AEROBIC ONLY Blood Culture results may not be optimal due to an inadequate volume of blood received in culture bottles   Culture   Final    NO GROWTH < 24 HOURS Performed at [facility], [address], [place], [place] [address]    Report Status PENDING  Incomplete  Culture, blood (Routine X 2) w Reflex to ID Panel     Status: None (Preliminary result)   Collection Time: 06/06/22  9:33 AM   Specimen: Left Antecubital; Blood  Result Value Ref Range Status   Specimen Description LEFT ANTECUBITAL  Final   Special Requests   Final    BOTTLES DRAWN AEROBIC AND ANAEROBIC Blood Culture adequate volume   Culture   Final    NO GROWTH < 24 HOURS Performed at [facility], [address] [place], [place] [address]    Report Status PENDING  Incomplete     Scheduled Meds:  Chlorhexidine Gluconate Cloth  6 each Topical Daily   heparin  5,000 Units Subcutaneous Q8H   insulin aspart  0-9 Units Subcutaneous Q8H   mouth rinse  15 mL Mouth Rinse Q4H   pantoprazole (PROTONIX) IV  40 mg Intravenous Q12H   Continuous Infusions:  sodium chloride     dextrose 5 % and 0.9% NaCl 100 mL/hr at 06/07/22 1114   micafungin (MYCAMINE) 100 mg in sodium chloride 0.9 % 100 mL IVPB 100 mg (06/07/22 1205)   norepinephrine (LEVOPHED) Adult infusion Stopped (06/07/22 0815)   piperacillin-tazobactam (ZOSYN)  IV Stopped (06/07/22 0911)   propofol (DIPRIVAN) infusion Stopped (06/05/22 0833)    Procedures/Studies: ECHOCARDIOGRAM COMPLETE  Result Date: 06/07/2022    ECHOCARDIOGRAM REPORT   Patient Name:   NYELI HOLTMEYER Date of Exam: 06/07/2022 Medical Rec #:  [id number]       Height:       61.0 in Accession #:    [id number]      Weight:       112.2 lb Date of Birth:  1946-09-27        BSA:          1.477 m Patient Age:    31 years        BP:           116/46 mmHg Patient Gender: F               HR:           104 bpm. Exam Location:  [facility] Procedure: 2D Echo, Cardiac Doppler and Color Doppler  Indications:    Bacteremia  History:        Patient has no prior history of Echocardiogram examinations.                 Signs/Symptoms:Bacteremia; Risk Factors:Hypertension and                 Dyslipidemia. Breast CA.  Sonographer:    Wenda Low Referring Phys: [id number] [place]  1. Abnormal septal motion . Left ventricular ejection fraction, by estimation, is 55 to 60%. The left ventricle has normal function. The left ventricle has no regional wall motion abnormalities. Left ventricular diastolic parameters were normal.  2. Right ventricular systolic function is normal. The right ventricular size is normal. There is mildly elevated pulmonary artery systolic pressure.  3. Moderate pleural  effusion in the left lateral region.  4. The mitral valve is abnormal. Trivial mitral valve regurgitation. No evidence of mitral stenosis.  5. Thickening and calcification noted . The tricuspid valve is abnormal.  6. Some thickening in the LVOT near intervalvular fibrosa Given this and nodular calcification of PV suggest TEE if suspicion for SBE high. The aortic valve is tricuspid. There is moderate calcification of the aortic valve. There is moderate thickening of the aortic valve. Aortic valve regurgitation is mild. Aortic valve sclerosis/calcification is present, without any evidence of aortic stenosis.  7. Nodular calcification seen on PV. The pulmonic valve was abnormal.  8. The inferior vena cava is normal in size with greater than 50% respiratory variability, suggesting right atrial pressure of 3 mmHg. FINDINGS  Left Ventricle: Abnormal septal motion. Left ventricular ejection fraction, by estimation, is 55 to 60%. The left ventricle has normal function. The left ventricle has no regional wall motion abnormalities. The left ventricular internal cavity size was normal in size. There is no left ventricular hypertrophy. Left ventricular diastolic parameters were normal. Right Ventricle: The right ventricular  size is normal. No increase in right ventricular wall thickness. Right ventricular systolic function is normal. There is mildly elevated pulmonary artery systolic pressure. The tricuspid regurgitant velocity is 3.21  m/s, and with an assumed right atrial pressure of 3 mmHg, the estimated right ventricular systolic pressure is 72.5 mmHg. Left Atrium: Left atrial size was normal in size. Right Atrium: Right atrial size was normal in size. Pericardium: There is no evidence of pericardial effusion. Mitral Valve: The mitral valve is abnormal. There is mild thickening of the mitral valve leaflet(s). There is mild calcification of the mitral valve leaflet(s). Mild mitral annular calcification. Trivial mitral valve regurgitation. No evidence of mitral valve stenosis. MV peak gradient, 3.9 mmHg. The mean mitral valve gradient is 1.0 mmHg. Tricuspid Valve: Thickening and calcification noted. The tricuspid valve is abnormal. Tricuspid valve regurgitation is trivial. No evidence of tricuspid stenosis. Aortic Valve: Some thickening in the LVOT near intervalvular fibrosa Given this and nodular calcification of PV suggest TEE if suspicion for SBE high. The aortic valve is tricuspid. There is moderate calcification of the aortic valve. There is moderate thickening of the aortic valve. Aortic valve regurgitation is mild. Aortic valve sclerosis/calcification is present, without any evidence of aortic stenosis. Aortic valve mean gradient measures 5.0 mmHg. Aortic valve peak gradient measures 10.3 mmHg. Aortic valve area, by VTI measures 2.02 cm. Pulmonic Valve: Nodular calcification seen on PV. The pulmonic valve was abnormal. Pulmonic valve regurgitation is trivial. No evidence of pulmonic stenosis. Aorta: The aortic root is normal in size and structure. Venous: The inferior vena cava is normal in size with greater than 50% respiratory variability, suggesting right atrial pressure of 3 mmHg. IAS/Shunts: No atrial level shunt  detected by color flow Doppler. Additional Comments: There is a moderate pleural effusion in the left lateral region.  LEFT VENTRICLE PLAX 2D LVIDd:         4.50 cm     Diastology LVIDs:         3.00 cm     LV e' medial:    7.29 cm/s LV PW:         0.80 cm     LV E/e' medial:  11.8 LV IVS:        0.80 cm     LV e' lateral:   9.79 cm/s LVOT diam:     1.80 cm  LV E/e' lateral: 8.8 LV SV:         55 LV SV Index:   37 LVOT Area:     2.54 cm  LV Volumes (MOD) LV vol d, MOD A2C: 43.7 ml LV vol d, MOD A4C: 52.7 ml LV vol s, MOD A2C: 17.3 ml LV vol s, MOD A4C: 23.8 ml LV SV MOD A2C:     26.4 ml LV SV MOD A4C:     52.7 ml LV SV MOD BP:      28.6 ml RIGHT VENTRICLE RV Basal diam:  3.10 cm RV Mid diam:    2.90 cm RV S prime:     14.50 cm/s TAPSE (M-mode): 3.0 cm LEFT ATRIUM             Index        RIGHT ATRIUM           Index LA diam:        3.60 cm 2.44 cm/m   RA Area:     11.50 cm LA Vol (A2C):   50.9 ml 34.45 ml/m  RA Volume:   21.80 ml  14.76 ml/m LA Vol (A4C):   43.7 ml 29.58 ml/m LA Biplane Vol: 49.8 ml 33.71 ml/m  AORTIC VALVE                     PULMONIC VALVE AV Area (Vmax):    1.92 cm      PV Vmax:       0.98 m/s AV Area (Vmean):   1.84 cm      PV Peak grad:  3.9 mmHg AV Area (VTI):     2.02 cm AV Vmax:           160.50 cm/s AV Vmean:          103.550 cm/s AV VTI:            0.274 m AV Peak Grad:      10.3 mmHg AV Mean Grad:      5.0 mmHg LVOT Vmax:         121.00 cm/s LVOT Vmean:        74.800 cm/s LVOT VTI:          0.218 m LVOT/AV VTI ratio: 0.79  AORTA Ao Root diam: 3.30 cm MITRAL VALVE               TRICUSPID VALVE MV Area (PHT): 6.02 cm    TR Peak grad:   41.2 mmHg MV Area VTI:   2.25 cm    TR Vmax:        321.00 cm/s MV Peak grad:  3.9 mmHg MV Mean grad:  1.0 mmHg    SHUNTS MV Vmax:       0.99 m/s    Systemic VTI:  0.22 m MV Vmean:      48.8 cm/s   Systemic Diam: 1.80 cm MV Decel Time: 126 msec MV E velocity: 86.00 cm/s MV A velocity: 72.70 cm/s MV E/A ratio:  1.18 Jenkins Rouge MD  Electronically signed by Jenkins Rouge MD Signature Date/Time: 06/07/2022/10:28:36 AM    Final    DG CHEST PORT 1 VIEW  Result Date: 06/05/2022 CLINICAL DATA:  76 year old female with respiratory failure. Intubated. EXAM: PORTABLE CHEST 1 VIEW COMPARISON:  Portable chest 06/04/2022 and earlier. FINDINGS: Portable AP semi upright view at 0527 hours. Endotracheal tube tip in good position between the clavicles and carina. Stable left IJ central line and visible enteric  tube. Increased left lung base and retrocardiac opacity now mostly obscuring the left hemidiaphragm. Slightly lower lung volumes overall. Mediastinal contours remain normal. No pneumothorax, pulmonary edema, or definite effusion. Negative right lung. Partially visible midline abdominal skin staples. Paucity of bowel gas in the upper abdomen. Stable left chest and axillary surgical clips. IMPRESSION: 1. Stable lines and tubes. 2. Increased left lower lobe collapse or consolidation since yesterday. Electronically Signed   By: Genevie Ann M.D.   On: 06/05/2022 08:26   DG Chest Port 1 View  Result Date: 06/04/2022 CLINICAL DATA:  Endotracheal tube, nasogastric tube and central line placement. EXAM: PORTABLE CHEST 1 VIEW COMPARISON:  June 03, 2022 FINDINGS: Since the prior study there has been interval placement of an endotracheal tube. Its distal tip is seen approximately 3.8 cm from the carina. Interval nasogastric tube placement is also seen. Its distal and extends into the gastric lumen. There is a left internal jugular venous catheter with its distal tip overlying the distal superior vena cava. This is approximately 6 mm proximal to the junction of the superior vena cava and right atrium. The heart size and mediastinal contours are within normal limits. Mild to moderate severity atelectasis and/or infiltrate is seen within the left lung base. This represents a new finding when compared to the prior study. There is no evidence of a pleural effusion or  pneumothorax. Radiopaque surgical clips are seen along the left axilla. The air seen below the right hemidiaphragm on the prior study is no longer visualized. The visualized skeletal structures are unremarkable. IMPRESSION: 1. Interval endotracheal tube, nasogastric tube and left internal jugular venous catheter placement positioning, as described above. 2. Mild to moderate severity left basilar atelectasis and/or infiltrate. Electronically Signed   By: Virgina Norfolk M.D.   On: 06/04/2022 02:47   CT Abdomen Pelvis Wo Contrast  Result Date: 06/03/2022 CLINICAL DATA:  Severe weakness for 2 days with acute abdominal pain, initial encounter EXAM: CT ABDOMEN AND PELVIS WITHOUT CONTRAST TECHNIQUE: Multidetector CT imaging of the abdomen and pelvis was performed following the standard protocol without IV contrast. RADIATION DOSE REDUCTION: This exam was performed according to the departmental dose-optimization program which includes automated exposure control, adjustment of the mA and/or kV according to patient size and/or use of iterative reconstruction technique. COMPARISON:  None Available. FINDINGS: Lower chest: Basilar atelectatic changes are noted. Hepatobiliary: Liver is within normal limits. Gallbladder demonstrates a normal appearance. Pancreas: Unremarkable. No pancreatic ductal dilatation or surrounding inflammatory changes. Spleen: Normal in size without focal abnormality. Adrenals/Urinary Tract: Adrenal glands are within normal limits. Kidneys demonstrate tiny nonobstructing stones on right in the lower pole. No ureteral obstruction is seen. The bladder is decompressed. Stomach/Bowel: Considerable fecal material is noted within the rectal vault which may represent a focal impaction. Diverticular change of the colon is noted as well as some generalized wall thickening throughout the colon. The appendix is within normal limits. Small bowel is within normal limits. Stomach demonstrates a defect in the  anterior gastric wall consistent with a perforated ulcer. There is a considerable amount of air and fluid throughout the abdomen with apparent direct communication with the gastric best seen on image number 36 of series 2 and image number 78 of series 6. Vascular/Lymphatic: Aortic atherosclerosis. No enlarged abdominal or pelvic lymph nodes. Reproductive: Status post hysterectomy. No adnexal masses. Other: Free air and free fluid similar to that described above throughout the abdomen. The bowel demonstrates some reactive wall thickening related to the fluid and air.  Musculoskeletal: Degenerative changes of the lumbar spine are seen. No other bony abnormality is noted. IMPRESSION: Changes consistent with a perforated gastric ulcer anteriorly with considerable spillage of fluid and air into the abdominal cavity. Some reactive wall thickening is noted within the bowel loops secondary to these changes. Critical Value/emergent results were called by telephone at the time of interpretation on 06/03/2022 at 10:43 pm to Dr. Aletta Edouard , who verbally acknowledged these results. Electronically Signed   By: Inez Catalina M.D.   On: 06/03/2022 22:45   DG Chest Port 1 View  Result Date: 06/03/2022 CLINICAL DATA:  Weakness. EXAM: PORTABLE CHEST 1 VIEW COMPARISON:  None Available. FINDINGS: Multiple overlying radiopaque cardiac lead wires are seen. The heart size and mediastinal contours are within normal limits. Mild, diffuse, chronic appearing increased lung markings are seen. There is no evidence of an acute infiltrate, pleural effusion or pneumothorax. Radiopaque surgical clips are seen along the lateral aspect of the left chest wall. A crescentic area of air is seen just below the right hemidiaphragm. Degenerative changes seen throughout the thoracic spine. IMPRESSION: 1. Chronic appearing increased lung markings without evidence of acute or active cardiopulmonary disease. 2. Air seen just below the right hemidiaphragm  which may represent an air filled loop of colon. Further evaluation with abdomen pelvis CT is recommended, as the presence of intra-abdominal free air cannot be excluded. Electronically Signed   By: Virgina Norfolk M.D.   On: 06/03/2022 21:22    Critical Care Procedure Note Authorized and Performed by: Murvin Natal MD  Total Critical Care time:  55 mins Due to a high probability of clinically significant, life threatening deterioration, the patient required my highest level of preparedness to intervene emergently and I personally spent this critical care time directly and personally managing the patient.  This critical care time included obtaining a history; examining the patient, pulse oximetry; ordering and review of studies; arranging urgent treatment with development of a management plan; evaluation of patient's response of treatment; frequent reassessment; and discussions with other providers.  This critical care time was performed to assess and manage the high probability of imminent and life threatening deterioration that could result in multi-organ failure.  It was exclusive of separately billable procedures and treating other patients and teaching time.    Irwin Brakeman, MD How to contact the [facility] Attending or Consulting provider [place] or covering provider during after hours [place], for this patient?  Check the care team in [facility] and look for a) attending/consulting [facility] provider listed and b) the [facility] team listed Log into [email or web address] and use [facility]'s universal password to access. If you do not have the password, please contact the hospital operator. Locate the [facility], [facility], [facility] provider you are looking for under [facility] and page to a number that you can be directly reached. If you still have difficulty reaching the provider, please page the [facility] (Director on Call) for the Hospitalists listed on amion for assistance.   [facility]  If 7PM-7AM, please contact  night-coverage [email or web address] Password [email or web address] 06/07/2022, 12:07 PM   LOS: 3 days

## 2022-06-07 NOTE — Progress Notes (Addendum)
I was present with the medical student for this service. I personally verified the history of present illness, performed the physical exam, and made the plan for this encounter. I have verified the medical student's documentation and made modifications where appropriately. I have personally documented in my own words a brief history, physical, and plan below.     Fungemia on blood cultures from 06/03/22 20:45 Intubated in the OR @ 06/05/11:44AM  CVL placed in OR after intubation   THIS BACTEREMIA AND FUNGEMIA PREDATES THE CENTRAL LINE. THIS IS NOT A CENTRAL LINE INFECTION.  CVL and arterial line removed 7/4 when fungemia came back positive.  ID wants TEE, sent cardiology a consult and message. PRN for pain Dc the propofol and levophed  NPO, NG can have ICE UGI Tomorrow BID PPI I am fine with TEE if they only have probe in the esophagus and do not do a lot of air in the esophagus (inflate). I would not want an EGD right now due to the perforation, but I would think TEE Is ok given that this is a distal stomach anterior ulcer repair.  Hold TPN as central line is out.  [employer] for now as do not need now with PIV, if needs more TPN will consider or if ID needs prolonged IV treatment  Fluids @ 100 Labs In AM SCDs, heparin sq   Discussed with son, Gerald Stabs, Hospitalist, ID, RN, and Cardiology.   Curlene Labrum, MD [facility] [place], [place] [address] [phone] (office)   [facility] Progress Note  3 Days Post-Op  Subjective: Patient appears comfortable, not able to converse much due to fatigue. No significant concerns from overnight.  Objective: Vital signs in last 24 hours: Temp:  [98.1 F (36.7 C)-98.6 F (37 C)] 98.6 F (37 C) (07/05 0000) Pulse Rate:  [54-121] 98 (07/05 0645) Resp:  [11-29] 14 (07/05 0645) BP: (104-155)/(45-65) 116/46 (07/05 0645) SpO2:  [89 %-100 %] 97 % (07/05 0645) Last BM Date :   (None during admission)  Intake/Output from previous day: 07/04 0701 - 07/05 0700 In: 2225.8 [I.V.:2011.2; IV Piggyback:214.6] Out: 1100 [Urine:1050; Drains:50] Intake/Output this shift: No intake/output data recorded.  General appearance: alert, cooperative, and fatigued. Patient is in and out of sleep. Cardio: warm and well perfused GI: incision c/d/ with honeycomb, JP with serous fluid   Lab Results:  Recent Labs    06/06/22 0352 06/07/22 0412  WBC 24.0* 25.2*  HGB 10.9* 11.8*  HCT 33.7* 37.6  PLT 137* 121*   BMET Recent Labs    06/06/22 0352 06/07/22 0412  NA 134* 137  K 4.6 4.4  CL 105 108  CO2 24 24  GLUCOSE 125* 125*  BUN 50* 43*  CREATININE 2.05* 1.81*  CALCIUM 7.8* 8.1*   PT/INR No results for input(s): "LABPROT", "INR" in the last 72 hours.  Studies/Results: No results found.  Anti-infectives: Anti-infectives (From admission, onward)    Start     Dose/Rate Route Frequency Ordered Stop   06/07/22 0200  fluconazole (DIFLUCAN) IVPB 200 mg  Status:  Discontinued        200 mg 100 mL/hr over 60 Minutes Intravenous Every 24 hours 06/06/22 0901 06/06/22 1031   06/06/22 1200  micafungin (MYCAMINE) 100 mg in sodium chloride 0.9 % 100 mL IVPB        100 mg 105 mL/hr over 1 Hours Intravenous Every 24 hours 06/06/22 1031     06/05/22 1800  piperacillin-tazobactam (ZOSYN) IVPB  3.375 g        3.375 g 12.5 mL/hr over 240 Minutes Intravenous Every 12 hours 06/05/22 0825 06/08/22 2359   06/04/22 0600  piperacillin-tazobactam (ZOSYN) IVPB 3.375 g  Status:  Discontinued        3.375 g 12.5 mL/hr over 240 Minutes Intravenous Every 8 hours 06/04/22 0219 06/05/22 0825   06/04/22 0315  fluconazole (DIFLUCAN) IVPB 200 mg  Status:  Discontinued        200 mg 100 mL/hr over 60 Minutes Intravenous Every 24 hours 06/04/22 0217 06/04/22 0219   06/04/22 0315  fluconazole (DIFLUCAN) IVPB 200 mg  Status:  Discontinued        200 mg 100 mL/hr over 60 Minutes Intravenous Every  24 hours 06/04/22 0219 06/06/22 0901   06/03/22 2130  ceFEPIme (MAXIPIME) 2 g in sodium chloride 0.9 % 100 mL IVPB        2 g 200 mL/hr over 30 Minutes Intravenous  Once 06/03/22 2127 06/03/22 2209   06/03/22 2130  metroNIDAZOLE (FLAGYL) IVPB 500 mg  Status:  Discontinued        500 mg 100 mL/hr over 60 Minutes Intravenous Every 12 hours 06/03/22 2127 06/04/22 0217       Assessment/Plan: s/p Procedure(s): EXPLORATORY LAPAROTOMY, omental patch Impression: Postoperative day 3. Patient on Levophed at 5 mcg. Central line was removed yesterday due to fungemia (patient's blood culture at time of admission prior to surgery returned positive for fungus). Repeat blood cultures pending.    LOS: 3 days    [place], Medical Student 06/07/2022

## 2022-06-07 NOTE — Inpatient Diabetes Management (Signed)
Inpatient Diabetes Program Recommendations  AACE/ADA: New Consensus Statement on Inpatient Glycemic Control (2015)  Target Ranges:  Prepandial:   less than 140 mg/dL      Peak postprandial:   less than 180 mg/dL (1-2 hours)      Critically ill patients:  140 - 180 mg/dL   Lab Results  Component Value Date   GLUCAP 101 (H) 06/07/2022    Review of Glycemic Control  Latest Reference Range & Units 06/06/22 07:44 06/06/22 16:12 06/06/22 16:33 06/07/22 00:28 06/07/22 07:56  Glucose-Capillary 70 - 99 mg/dL 135 (H) 68 (L) 106 (H) 105 (H) 101 (H)   Diabetes history: none  Current orders for Inpatient glycemic control:  Novolog 0-9 units Q8 hours  On TPN  Inpatient Diabetes Program Recommendations:    -  Consider decreasing the Novolog Correction scale to "very sensitive" scale 0-6 units Q8. Due to renal function.  Thanks,  Tama Headings RN, MSN, BC-ADM Inpatient Diabetes Coordinator Team Pager [phone] (8a-5p)

## 2022-06-07 NOTE — Evaluation (Signed)
Physical Therapy Evaluation Patient Details Name: Carolyn Erickson MRN: [id number] DOB: 1946-05-04 Today's Date: 06/07/2022  History of Present Illness  Ms. Yust is a 76 yo with a history of HTN and depression who comes in with weakness and abdominal pain. She has been having some pain generalized for months being treated for gastritis/ ulcer with PPI by PCP and referred to GI. She has not seen GI. She has a history of diarrhea and constipation. She says over the last few days she was feeling weaker and her pain because worse and more severe acutely. She denies any chest pain or SOB. She has no fever. She has been hypotensive and required fluid bolus and is on levophed at 2 in the ED.   Clinical Impression  Patient presents supine in bed with family present. Patient partially lethargic impacting subjective history in which information gather from family. Patient is max with bed mobility with slow labored movement and extended time needed. Able to initiate LE movement but needs assist to reach EOB. Poor trunk control with general weakness of B UE requiring trunk assist and HHA to get patient into sitting position. Min/mod assist with sit to stand and use of RW. Able to initiate LE push-off into standing position but unable to maintain standing upright without trunk stability provided by PT and reliance on RW. Verbal and tactile cueing needed. Mod/max assist with bed to chair transfer with RW. Poor balance observed with fair coordination but decreases due to fatigue resulting in B LE buckling during transfer. Patient limited to few steps with ambulation in room with mod/max assist and use of RW. Limited by fatigue and lethargic presentation. Patient left in chair with family present and nursing staff notified of mobility status. Patient will benefit from continued skilled physical therapy in hospital and recommended venue below to increase strength, balance, endurance for safe ADLs and gait.       Recommendations for follow up therapy are one component of a multi-disciplinary discharge planning process, led by the attending physician.  Recommendations may be updated based on patient status, additional functional criteria and insurance authorization.  Follow Up Recommendations Skilled nursing-short term rehab (<3 hours/day) Can patient physically be transported by private vehicle: Yes    Assistance Recommended at Discharge Set up Supervision/Assistance  Patient can return home with the following  A little help with walking and/or transfers;Help with stairs or ramp for entrance;Assistance with cooking/housework;Assist for transportation    Equipment Recommendations None recommended by PT  Recommendations for Other Services       Functional Status Assessment Patient has had a recent decline in their functional status and demonstrates the ability to make significant improvements in function in a reasonable and predictable amount of time.     Precautions / Restrictions Precautions Precautions: Fall Restrictions Weight Bearing Restrictions: No      Mobility  Bed Mobility Overal bed mobility: Needs Assistance Bed Mobility: Supine to Sit     Supine to sit: Max assist     General bed mobility comments: Max assist with supine to sit. Able to initiate LE movement but needs assist to reach EOB. Poor trunk control with general weakness of UE requiring trunk assist and HHA to get patient in sitting position. Extended time and slow labored movement    Transfers Overall transfer level: Needs assistance Equipment used: Rolling walker (2 wheels) Transfers: Sit to/from Stand, Bed to chair/wheelchair/BSC Sit to Stand: Mod assist, Min assist   Step pivot transfers: Mod assist, Max assist  General transfer comment: Min/mod assist with STS and RW. Able to initiate LE push-off with some assist but needs trunk stability to maintain upright position. Verbal and tactile cueing  needed. Mod/max assist with bed to chair transfer with RW. Poor balance with coordination decreasing during transfer due to fatigue.    Ambulation/Gait Ambulation/Gait assistance: Mod assist, Max assist Gait Distance (Feet): 3 Feet Assistive device: Rolling walker (2 wheels) Gait Pattern/deviations: Step-to pattern, Decreased step length - right, Decreased step length - left, Decreased stride length, Narrow base of support, Trunk flexed, Knees buckling Gait velocity: decreased     General Gait Details: Limited to a few steps ambulating in room with mod/max assist with RW. Limited by fatigue and poor balance/trunk control.  Stairs            Wheelchair Mobility    Modified Rankin (Stroke Patients Only)       Balance Overall balance assessment: Needs assistance Sitting-balance support: Bilateral upper extremity supported, Feet supported Sitting balance-Leahy Scale: Poor Sitting balance - Comments: Seated EOB with limited UE support- requires trunk stabilization Postural control: Posterior lean Standing balance support: Bilateral upper extremity supported, During functional activity, Reliant on assistive device for balance Standing balance-Leahy Scale: Poor Standing balance comment: Poor to fair in standing with trunk stabilization needed and reliant on RW.                             Pertinent Vitals/Pain Pain Assessment Pain Assessment: Faces Faces Pain Scale: Hurts a little bit Pain Intervention(s): Limited activity within patient's tolerance, Monitored during session, Repositioned    Home Living Family/patient expects to be discharged to:: Private residence Living Arrangements: Spouse/significant other;Children Available Help at Discharge: Family;Available PRN/intermittently;Available 24 hours/day Type of Home: House Home Access: Stairs to enter Entrance Stairs-Rails: None     Home Layout: Two level Home Equipment: Cane - single point;Rolling Walker  (2 wheels);Grab bars - tub/shower;Shower seat      Prior Function Prior Level of Function : Needs assist       Physical Assist : ADLs (physical)   ADLs (physical): IADLs Mobility Comments: Family reports patient was able to ambulate at home with intermittent SPC/RW use. Limited community ambulator. Not driving. ADLs Comments: Family reports patient needs assist with ADL's recently after recent fall but was independent before.     Hand Dominance        Extremity/Trunk Assessment   Upper Extremity Assessment Upper Extremity Assessment: Generalized weakness    Lower Extremity Assessment Lower Extremity Assessment: Generalized weakness    Cervical / Trunk Assessment Cervical / Trunk Assessment: Normal  Communication   Communication: Expressive difficulties  Cognition Arousal/Alertness: Lethargic Behavior During Therapy: WFL for tasks assessed/performed Overall Cognitive Status: Within Functional Limits for tasks assessed                                          General Comments      Exercises     Assessment/Plan    PT Assessment Patient needs continued PT services;All further PT needs can be met in the next venue of care  PT Problem List Decreased strength;Decreased activity tolerance;Decreased balance;Decreased mobility;Decreased coordination       PT Treatment Interventions DME instruction;Gait training;Functional mobility training;Therapeutic activities;Therapeutic exercise;Balance training    PT Goals (Current goals can be found in the Care Plan section)  Acute Rehab PT Goals Patient Stated Goal: return home PT Goal Formulation: With patient Time For Goal Achievement: 06/07/22 Potential to Achieve Goals: Fair    Frequency Min 3X/week     Co-evaluation               AM-PAC PT "6 Clicks" Mobility  Outcome Measure Help needed turning from your back to your side while in a flat bed without using bedrails?: A Lot Help needed  moving from lying on your back to sitting on the side of a flat bed without using bedrails?: A Lot Help needed moving to and from a bed to a chair (including a wheelchair)?: A Lot Help needed standing up from a chair using your arms (e.g., wheelchair or bedside chair)?: A Lot Help needed to walk in hospital room?: Total Help needed climbing 3-5 steps with a railing? : Total 6 Click Score: 10    End of Session Equipment Utilized During Treatment: Oxygen Activity Tolerance: Patient tolerated treatment well;Patient limited by fatigue;Patient limited by lethargy;No increased pain Patient left: in chair;with call bell/phone within reach;with family/visitor present Nurse Communication: Mobility status PT Visit Diagnosis: Unsteadiness on feet (R26.81);Other abnormalities of gait and mobility (R26.89);Muscle weakness (generalized) (M62.81)    Time: 6503-5465 PT Time Calculation (min) (ACUTE ONLY): 26 min   Charges:   PT Evaluation $PT Eval Moderate Complexity: 1 Mod PT Treatments $Therapeutic Activity: 23-37 mins        4:14 PM, 06/07/22 Lestine Box, S/PT

## 2022-06-07 NOTE — Progress Notes (Signed)
*  PRELIMINARY RESULTS* Echocardiogram 2D Echocardiogram has been performed.  Carolyn Erickson 06/07/2022, 9:27 AM

## 2022-06-07 NOTE — Progress Notes (Signed)
Pharmacy Antibiotic Note  Carolyn Erickson is a 76 y.o. female admitted on 06/03/2022 with  gastric perforation s/p OR .  Pharmacy has been consulted for Zosyn dosing for 5 day duration. WBC ok, noted renal dysfunction.   Plan: Zosyn 3.375G IV q12h to be infused over 4 hours x 5 days  Trend WBC, temp, renal function  Height: '5\' 1"'$  (154.9 cm) Weight: 50.9 kg (112 lb 3.4 oz) IBW/kg (Calculated) : 47.8  Temp (24hrs), Avg:98.2 F (36.8 C), Min:97.8 F (36.6 C), Max:98.6 F (37 C)  Recent Labs  Lab 06/03/22 2045 06/03/22 2145 06/03/22 2153 06/03/22 2318 06/04/22 0444 06/04/22 0624 06/04/22 0924 06/04/22 1210 06/05/22 0357 06/05/22 0955 06/06/22 0352 06/07/22 0412  WBC 4.1  --   --   --  4.8  --   --   --  22.2*  --  24.0* 25.2*  CREATININE 2.14*  --  2.10*  --  1.64*  --   --   --  2.28*  --  2.05* 1.81*  LATICACIDVEN 5.9*   < >  --    < > 3.4* 2.8* 2.9* 2.8*  --  1.3  --   --    < > = values in this interval not displayed.     Estimated Creatinine Clearance: 19.3 mL/min (A) (by C-G formula based on SCr of 1.81 mg/dL (H)).    No Known Allergies  Thomasenia Sales, PharmD, [facility] Clinical Pharmacist

## 2022-06-07 NOTE — Plan of Care (Signed)
  Problem: Acute Rehab PT Goals(only PT should resolve) Goal: Pt Will Go Supine/Side To Sit Outcome: Progressing Flowsheets (Taken 06/07/2022 1614) Pt will go Supine/Side to Sit:  with moderate assist  with minimal assist Goal: Patient Will Transfer Sit To/From Stand Outcome: Progressing Flowsheets (Taken 06/07/2022 1614) Patient will transfer sit to/from stand:  with minimal assist  with moderate assist Goal: Pt Will Transfer Bed To Chair/Chair To Bed Outcome: Progressing Flowsheets (Taken 06/07/2022 1614) Pt will Transfer Bed to Chair/Chair to Bed:  with mod assist  with max assist Goal: Pt Will Perform Standing Balance Or Pre-Gait Outcome: Progressing Flowsheets (Taken 06/07/2022 1614) Pt will perform standing balance or pre-gait:  1-2 min  with bilateral UE support  with maximum assist  with moderate assist Goal: Pt Will Ambulate Outcome: Progressing Flowsheets (Taken 06/07/2022 1614) Pt will Ambulate:  25 feet  with moderate assist  with maximum assist  with rolling walker   4:15 PM, 06/07/22 Lestine Box, S/PT

## 2022-06-08 ENCOUNTER — Inpatient Hospital Stay (HOSPITAL_COMMUNITY): Payer: Medicare PPO

## 2022-06-08 DIAGNOSIS — A419 Sepsis, unspecified organism: Secondary | ICD-10-CM | POA: Diagnosis not present

## 2022-06-08 DIAGNOSIS — B49 Unspecified mycosis: Secondary | ICD-10-CM | POA: Diagnosis not present

## 2022-06-08 DIAGNOSIS — K255 Chronic or unspecified gastric ulcer with perforation: Secondary | ICD-10-CM | POA: Diagnosis not present

## 2022-06-08 DIAGNOSIS — N179 Acute kidney failure, unspecified: Secondary | ICD-10-CM | POA: Diagnosis not present

## 2022-06-08 LAB — CBC WITH DIFFERENTIAL/PLATELET
Abs Immature Granulocytes: 1.28 10*3/uL — ABNORMAL HIGH (ref 0.00–0.07)
Basophils Absolute: 0.1 10*3/uL (ref 0.0–0.1)
Basophils Relative: 1 %
Eosinophils Absolute: 0.4 10*3/uL (ref 0.0–0.5)
Eosinophils Relative: 2 %
HCT: 37.9 % (ref 36.0–46.0)
Hemoglobin: 11.7 g/dL — ABNORMAL LOW (ref 12.0–15.0)
Immature Granulocytes: 6 %
Lymphocytes Relative: 8 %
Lymphs Abs: 1.8 10*3/uL (ref 0.7–4.0)
MCH: 29.5 pg (ref 26.0–34.0)
MCHC: 30.9 g/dL (ref 30.0–36.0)
MCV: 95.5 fL (ref 80.0–100.0)
Monocytes Absolute: 1.5 10*3/uL — ABNORMAL HIGH (ref 0.1–1.0)
Monocytes Relative: 7 %
Neutro Abs: 17.9 10*3/uL — ABNORMAL HIGH (ref 1.7–7.7)
Neutrophils Relative %: 76 %
Platelets: 132 10*3/uL — ABNORMAL LOW (ref 150–400)
RBC: 3.97 MIL/uL (ref 3.87–5.11)
RDW: 13.7 % (ref 11.5–15.5)
WBC: 23 10*3/uL — ABNORMAL HIGH (ref 4.0–10.5)
nRBC: 0.1 % (ref 0.0–0.2)

## 2022-06-08 LAB — GLUCOSE, CAPILLARY
Glucose-Capillary: 148 mg/dL — ABNORMAL HIGH (ref 70–99)
Glucose-Capillary: 66 mg/dL — ABNORMAL LOW (ref 70–99)
Glucose-Capillary: 70 mg/dL (ref 70–99)
Glucose-Capillary: 76 mg/dL (ref 70–99)
Glucose-Capillary: 84 mg/dL (ref 70–99)
Glucose-Capillary: 84 mg/dL (ref 70–99)
Glucose-Capillary: 88 mg/dL (ref 70–99)
Glucose-Capillary: 89 mg/dL (ref 70–99)
Glucose-Capillary: 99 mg/dL (ref 70–99)

## 2022-06-08 LAB — COMPREHENSIVE METABOLIC PANEL
ALT: 13 U/L (ref 0–44)
AST: 16 U/L (ref 15–41)
Albumin: <1.5 g/dL — ABNORMAL LOW (ref 3.5–5.0)
Alkaline Phosphatase: 79 U/L (ref 38–126)
Anion gap: 6 (ref 5–15)
BUN: 47 mg/dL — ABNORMAL HIGH (ref 8–23)
CO2: 23 mmol/L (ref 22–32)
Calcium: 8.4 mg/dL — ABNORMAL LOW (ref 8.9–10.3)
Chloride: 111 mmol/L (ref 98–111)
Creatinine, Ser: 1.82 mg/dL — ABNORMAL HIGH (ref 0.44–1.00)
GFR, Estimated: 28 mL/min — ABNORMAL LOW (ref >60)
Glucose, Bld: 72 mg/dL (ref 70–99)
Potassium: 4.7 mmol/L (ref 3.5–5.1)
Sodium: 140 mmol/L (ref 135–145)
Total Bilirubin: 2.7 mg/dL — ABNORMAL HIGH (ref 0.3–1.2)
Total Protein: 4.4 g/dL — ABNORMAL LOW (ref 6.5–8.1)

## 2022-06-08 LAB — PHOSPHORUS: Phosphorus: 4.8 mg/dL — ABNORMAL HIGH (ref 2.5–4.6)

## 2022-06-08 LAB — MAGNESIUM: Magnesium: 2.3 mg/dL (ref 1.7–2.4)

## 2022-06-08 MED ORDER — DEXTROSE 50 % IV SOLN
12.5000 g | INTRAVENOUS | Status: AC
  Administered 2022-06-08: 12.5 g via INTRAVENOUS
  Filled 2022-06-08: qty 50

## 2022-06-08 MED ORDER — IOHEXOL 300 MG/ML  SOLN
100.0000 mL | Freq: Once | INTRAMUSCULAR | Status: AC | PRN
  Administered 2022-06-19: 80 mL

## 2022-06-08 MED ORDER — DEXTROSE IN LACTATED RINGERS 5 % IV SOLN: INTRAVENOUS | Status: DC

## 2022-06-08 MED ORDER — DIATRIZOATE MEGLUMINE & SODIUM 66-10 % PO SOLN
ORAL | Status: AC
  Filled 2022-06-08: qty 30

## 2022-06-08 MED ORDER — MORPHINE SULFATE (PF) 2 MG/ML IV SOLN
1.0000 mg | INTRAVENOUS | Status: DC | PRN
  Administered 2022-06-08 – 2022-06-23 (×16): 1 mg via INTRAVENOUS
  Filled 2022-06-08 (×16): qty 1

## 2022-06-08 MED ORDER — INSULIN ASPART 100 UNIT/ML IJ SOLN
0.0000 [IU] | Freq: Three times a day (TID) | INTRAMUSCULAR | Status: DC
  Administered 2022-06-09 (×2): 2 [IU] via SUBCUTANEOUS
  Administered 2022-06-09 – 2022-06-11 (×3): 1 [IU] via SUBCUTANEOUS

## 2022-06-08 MED ORDER — INSULIN ASPART 100 UNIT/ML IJ SOLN: 0.0000 [IU] | INTRAMUSCULAR | Status: DC

## 2022-06-08 NOTE — Progress Notes (Signed)
PROGRESS NOTE  Carolyn Erickson UMP:[id number] DOB: 10-13-1946 DOA: 06/03/2022 PCP: Dettinger, Fransisca Kaufmann, MD  Brief History:  76 year old female with history of major neurocognitive disorder, hypertension, hyperlipidemia, depression, left breast cancer presenting with generalized weakness and abdominal pain.  Apparently, the patient has been having abdominal pain since April 2023.  The patient had been started on pantoprazole at that time.  There was no improvement.  She followed up with her PCP on 06/02/2022.  Referral was given to see gastroenterology.  Abdominal pain had worsened over the past 2 days prior to admission with worsening generalized weakness.  There is no fevers, chills, chest pain, shortness breath, vomiting, diarrhea.  There is no hematochezia or melena noted. In the emergency department, the patient was hypotensive with a blood pressure of 71/54.  She was somnolent.  CT of the abdomen and pelvis was performed and showed changes consistent with a perforated gastric ulcer.  Dr. Constance Haw was consulted, and the patient was taken to the OR for exploratory laparotomy, gastrorrhaphy, omental patch.  A left IJ central line was also placed.  TRH is consulted for medical management In the ED, the patient had low-grade fever 99.5 F.  She was tachycardic and hypotensive with blood pressure 68/53.  WBC 4.1, hemoglobin 14.6, platelets 308,000.  BMP showed sodium 137, potassium 4.1, bicarbonate 22, serum creatinine 2.14.  Postoperatively, the patient was started on Zosyn and fluconazole.  She remained on the ventilator.  Preoperative chest x-ray showed bibasilar atelectasis.  There was free air under the diaphragm.     Assessment and Plan: * Septic shock ([place]) Presented with hypotension, tachycardia, and lactic acid peaking at 6.8 Weaned off Levophed>> wean as tolerated for SBP greater 90 Secondary to gastric perforation with peritonitis 06/03/22 blood cultures--C. glabrata UA 11-20  WBC>>urine culture unrevealing Completed Zosyn 7/6 disContinued fluconazole per ID, continue echinocandin (micafungin)  Fungemia 06/03/22 blood cultures--Candida glabrata -d/c fluconazole per ID consult -continue echinocandin -discussed with general surgery>>removed central line 7/4 for line holiday -will need at least 24 hours central line holiday -repeat blood culture on 7/4 with no growth to date   Hypomagnesemia Repleted  Acute respiratory failure with hypoxia (HCC) Currently PRVC, RR 20, Vt 380, FiO2 0.4 -7/3 personally reviewed CXR--increased interstitial marking -7/3 ABG--7.35/36/107/20 (0.4) -7/3--extubated -7/4-5--stable on 4L [place] -7/6 - down to 2L/min [place]  Major neurocognitive disorder [facility]) Patient had MoCa 17/30 on 03/14/22 -Restart Aricept once stable and clinically -At risk for hospital delirium  Acute renal failure superimposed on stage 3a chronic kidney disease ([place]) -Serum creatinine peaking 2.28 but trending down  -Secondary to hemodynamic changes/sepsis and hypovolemia -Monitor serial BMPs  GERD (gastroesophageal reflux disease) Continue pantoprazole IV twice daily  Gastric perforation ([place]) Suspect this may be due to chronic NSAID use -Continue PPI twice daily -06/04/2022--ex laparotomy with omental patch -Postoperative care per Dr. Constance Haw -TNA therapy on hold temporarily due to need for CVL holiday -UGI 7/6 - no leak seen.  - clears started by surgery team 7/6  Essential hypertension Holding amlodipine secondary to hypotension  Family Communication:   Family at bedside 7/4, 7/5  Consultants:  general surgery  Code Status:  FULL   DVT Prophylaxis:  SCDs  Procedures: As Listed in Progress Note Above  Antibiotics: Fluconazole 7/2>>7/4 Micafungin 7/4>> Zosyn 7/2>>7/6  Subjective: no specific complaints.   Objective: Vitals:   06/08/22 1400 06/08/22 1500 06/08/22 1600 06/08/22 1642  BP: 137/66 (!) 130/59 (!) 135/58 139/69  Pulse: (!)  103 98 99 97  Resp: '19 20 20 15  '$ Temp:    (!) 97.5 F (36.4 C)  TempSrc:    Oral  SpO2: 95% 100% 98% 96%  Weight:      Height:        Intake/Output Summary (Last 24 hours) at 06/08/2022 1804 Last data filed at 06/08/2022 1652 Gross per 24 hour  Intake 697.68 ml  Output 600 ml  Net 97.68 ml   Weight change:  Exam:  General:  remains somnolent, hard of hearing, NAD, cooperative. HEENT: No icterus, No thrush, No neck mass, [place]/AT Cardiovascular: normal s1,s2 sounds, no m/r/g heard. Respiratory: bilateral bS clear, good air movement, no rales, wheezing heard. Abdomen: soft, nondistended, no masses palpated. Normal BS Extremities: no clubbing or cyanosis, trace pretibial edema.  Data Reviewed: I have personally reviewed following labs and imaging studies Basic Metabolic Panel: Recent Labs  Lab 06/04/22 0444 06/05/22 0357 06/06/22 0352 06/07/22 0412 06/08/22 0344  NA 137 131* 134* 137 140  K 4.0 4.6 4.6 4.4 4.7  CL 108 103 105 108 111  CO2 '23 22 24 24 23  '$ GLUCOSE 124* 187* 125* 125* 72  BUN 36* 48* 50* 43* 47*  CREATININE 1.64* 2.28* 2.05* 1.81* 1.82*  CALCIUM 8.3* 7.6* 7.8* 8.1* 8.4*  MG 1.3* 2.1 2.1  --  2.3  PHOS 3.5 4.5 4.5  --  4.8*   Liver Function Tests: Recent Labs  Lab 06/03/22 2045 06/05/22 0357 06/06/22 0352 06/07/22 0412 06/08/22 0344  AST '27 20 17 17 16  '$ ALT '25 19 15 14 13  '$ ALKPHOS 62 46 56 74 79  BILITOT 1.8* 1.7* 1.8* 2.4* 2.7*  PROT 6.9 4.0* 3.9* 4.1* 4.4*  ALBUMIN 3.2* 1.5* <1.5* <1.5* <1.5*   Recent Labs  Lab 06/03/22 2133  LIPASE 24   No results for input(s): "AMMONIA" in the last 168 hours. Coagulation Profile: Recent Labs  Lab 06/03/22 2045  INR 1.3*   CBC: Recent Labs  Lab 06/03/22 2045 06/03/22 2153 06/04/22 0444 06/05/22 0357 06/06/22 0352 06/07/22 0412 06/08/22 0344  WBC 4.1  --  4.8 22.2* 24.0* 25.2* 23.0*  NEUTROABS 2.9  --  2.5  --  20.5*  --  17.9*  HGB 14.6   < > 12.4 12.0 10.9* 11.8* 11.7*  HCT 46.4*   < >  38.4 36.1 33.7* 37.6 37.9  MCV 95.3  --  94.1 92.3 94.7 94.5 95.5  PLT 308  --  249 175 137* 121* 132*   < > = values in this interval not displayed.   Cardiac Enzymes: No results for input(s): "CKTOTAL", "CKMB", "CKMBINDEX", "TROPONINI" in the last 168 hours. BNP: Invalid input(s): "POCBNP" CBG: Recent Labs  Lab 06/08/22 0653 06/08/22 0757 06/08/22 1206 06/08/22 1549 06/08/22 1713  GLUCAP 89 88 99 84 84   HbA1C: No results for input(s): "HGBA1C" in the last 72 hours. Urine analysis:    Component Value Date/Time   COLORURINE AMBER (A) 06/04/2022 0520   APPEARANCEUR CLOUDY (A) 06/04/2022 0520   APPEARANCEUR Cloudy (A) 04/13/2021 1530   LABSPEC 1.027 06/04/2022 0520   PHURINE 5.0 06/04/2022 0520   GLUCOSEU NEGATIVE 06/04/2022 0520   HGBUR NEGATIVE 06/04/2022 0520   BILIRUBINUR NEGATIVE 06/04/2022 0520   BILIRUBINUR Negative 04/13/2021 1530   KETONESUR NEGATIVE 06/04/2022 0520   PROTEINUR 100 (A) 06/04/2022 0520   NITRITE NEGATIVE 06/04/2022 0520   LEUKOCYTESUR SMALL (A) 06/04/2022 0520    Recent Results (from the past 240 hour(s))  Cath Tip Culture     Status: None (Preliminary result)   Collection Time: 06/03/22  8:07 PM   Specimen: Catheter Tip  Result Value Ref Range Status   Specimen Description CATH TIP  Final   Special Requests NONE  Final   Culture   Final    NO GROWTH 2 DAYS Performed at [facility], [address], [place], [place] [address]    Report Status PENDING  Incomplete  Culture, blood (Routine x 2)     Status: Abnormal (Preliminary result)   Collection Time: 06/03/22  8:45 PM   Specimen: BLOOD  Result Value Ref Range Status   Specimen Description   Final    BLOOD LEFT ANTECUBITAL Performed at [facility], [place] [address], [place], [place] [address]    Special Requests   Final    BOTTLES DRAWN AEROBIC AND ANAEROBIC Blood Culture adequate volume Performed at [facility], [address], [place], [place] [address]    Culture   Setup Time   Final    YEAST BOTH Gram Stain Report Called to,Read Back By and Verified With: JACOBS, U'@0745'$  BY MATTHEWS, B 7.4.2023 CRITICAL RESULT CALLED TO, READ BACK BY AND VERIFIED WITH: PHARMD S.HURTZ AT 1130 ON 06/06/2022 BY T.SAAD.    Culture (A)  Final    CANDIDA GLABRATA Sent to [facility] for further susceptibility testing. Performed at [facility], [place] [address], [place], [place] [address]    Report Status PENDING  Incomplete  Blood Culture ID Panel (Reflexed)     Status: Abnormal   Collection Time: 06/03/22  8:45 PM  Result Value Ref Range Status   Enterococcus faecalis NOT DETECTED NOT DETECTED Final   Enterococcus Faecium NOT DETECTED NOT DETECTED Final   Listeria monocytogenes NOT DETECTED NOT DETECTED Final   Staphylococcus species NOT DETECTED NOT DETECTED Final   Staphylococcus aureus (BCID) NOT DETECTED NOT DETECTED Final   Staphylococcus epidermidis NOT DETECTED NOT DETECTED Final   Staphylococcus lugdunensis NOT DETECTED NOT DETECTED Final   Streptococcus species NOT DETECTED NOT DETECTED Final   Streptococcus agalactiae NOT DETECTED NOT DETECTED Final   Streptococcus pneumoniae NOT DETECTED NOT DETECTED Final   Streptococcus pyogenes NOT DETECTED NOT DETECTED Final   A.calcoaceticus-baumannii NOT DETECTED NOT DETECTED Final   Bacteroides fragilis NOT DETECTED NOT DETECTED Final   Enterobacterales NOT DETECTED NOT DETECTED Final   Enterobacter cloacae complex NOT DETECTED NOT DETECTED Final   Escherichia coli NOT DETECTED NOT DETECTED Final   Klebsiella aerogenes NOT DETECTED NOT DETECTED Final   Klebsiella oxytoca NOT DETECTED NOT DETECTED Final   Klebsiella pneumoniae NOT DETECTED NOT DETECTED Final   Proteus species NOT DETECTED NOT DETECTED Final   Salmonella species NOT DETECTED NOT DETECTED Final   Serratia marcescens NOT DETECTED NOT DETECTED Final   Haemophilus influenzae NOT DETECTED NOT DETECTED Final   Neisseria meningitidis NOT DETECTED NOT  DETECTED Final   Pseudomonas aeruginosa NOT DETECTED NOT DETECTED Final   Stenotrophomonas maltophilia NOT DETECTED NOT DETECTED Final   Candida albicans NOT DETECTED NOT DETECTED Final   Candida auris NOT DETECTED NOT DETECTED Final   Candida glabrata DETECTED (A) NOT DETECTED Final    Comment: CRITICAL RESULT CALLED TO, READ BACK BY AND VERIFIED WITH: PHARMD S.HURTZ AT 1130 ON 06/06/2022 BY T.SAAD.    Candida krusei NOT DETECTED NOT DETECTED Final   Candida parapsilosis NOT DETECTED NOT DETECTED Final   Candida tropicalis NOT DETECTED NOT DETECTED Final   Cryptococcus neoformans/gattii  NOT DETECTED NOT DETECTED Final    Comment: Performed at [facility], [place] [address], [place], [place] [address]  Culture, blood (Routine x 2)     Status: Abnormal (Preliminary result)   Collection Time: 06/03/22  9:32 PM   Specimen: Left Antecubital; Blood  Result Value Ref Range Status   Specimen Description   Final    LEFT ANTECUBITAL Performed at [facility], [address] [place], [place] [address]    Special Requests   Final    BOTTLES DRAWN AEROBIC AND ANAEROBIC Blood Culture adequate volume Performed at [facility], [address], [place], [place] [address]    Culture  Setup Time   Final    AEROBIC BOTTLE ONLY YEAST Gram Stain Report Called to,Read Back By and Verified With: UTE JACOBS @ 1200 ON 06/06/22 C [facility] CRITICAL VALUE NOTED.  VALUE IS CONSISTENT WITH PREVIOUSLY REPORTED AND CALLED VALUE. Performed at [facility], [place] [address], [place], [place] [address]    Culture CANDIDA GLABRATA (A)  Final   Report Status PENDING  Incomplete  MRSA Next Gen by PCR, Nasal     Status: None   Collection Time: 06/04/22  2:18 AM   Specimen: Nasal Mucosa; Nasal Swab  Result Value Ref Range Status   MRSA by PCR Next Gen NOT DETECTED NOT DETECTED Final    Comment: (NOTE) The GeneXpert MRSA Assay (FDA approved for NASAL specimens only), is one component of a comprehensive MRSA  colonization surveillance program. It is not intended to diagnose MRSA infection nor to guide or monitor treatment for MRSA infections. Test performance is not FDA approved in patients less than 36 years old. Performed at [facility], [address] [place], [place] [address]   Urine Culture     Status: Abnormal   Collection Time: 06/04/22  2:28 AM   Specimen: PATH Cytology Urine  Result Value Ref Range Status   Specimen Description   Final    URINE, CLEAN CATCH Performed at [facility], [address] [place], [place] [address]    Special Requests   Final    NONE Performed at [facility], [address] [place], [place] [address]    Culture (A)  Final    <10,000 COLONIES/mL INSIGNIFICANT GROWTH Performed at [facility] [address] [place], [place] [address]    Report Status 06/05/2022 FINAL  Final  Aerobic/Anaerobic Culture w Gram Stain (surgical/deep wound)     Status: None (Preliminary result)   Collection Time: 06/04/22  3:03 AM   Specimen: PATH Cytology FNA; Body Fluid  Result Value Ref Range Status   Specimen Description   Final    ANAL Performed at [facility], [address], [place],  [address]    Special Requests   Final    NONE Performed at [facility], [address], [place],  [address]    Gram Stain   Final    MODERATE WBC PRESENT,BOTH PMN AND MONONUCLEAR FEW GRAM POSITIVE COCCI IN PAIRS MODERATE GRAM POSITIVE RODS Performed at [facility], [place] [address], [place],  [address]    Culture   Final    FEW CANDIDA ALBICANS NO ANAEROBES ISOLATED; CULTURE IN PROGRESS FOR 5 DAYS    Report Status PENDING  Incomplete  Culture, blood (Routine X 2) w Reflex to ID Panel     Status: None (Preliminary result)   Collection Time: 06/06/22  9:32 AM   Specimen: BLOOD LEFT WRIST  Result Value Ref Range Status   Specimen Description BLOOD LEFT WRIST  Final   Special Requests   Final    BOTTLES DRAWN AEROBIC ONLY Blood Culture  results may not be optimal due to an inadequate volume of blood received in culture bottles   Culture   Final    NO GROWTH 2 DAYS Performed at [facility], [address], [place], [place] [address]    Report Status PENDING  Incomplete  Culture, blood (Routine X 2) w Reflex to ID Panel     Status: None (Preliminary result)   Collection Time: 06/06/22  9:33 AM   Specimen: Left Antecubital; Blood  Result Value Ref Range Status   Specimen Description LEFT ANTECUBITAL  Final   Special Requests   Final    BOTTLES DRAWN AEROBIC AND ANAEROBIC Blood Culture adequate volume   Culture   Final    NO GROWTH 2 DAYS Performed at [facility], [address], [place], [place] [address]    Report Status PENDING  Incomplete     Scheduled Meds:  Chlorhexidine Gluconate Cloth  6 each Topical Daily   heparin  5,000 Units Subcutaneous Q8H   insulin aspart  0-9 Units Subcutaneous TID WC   mouth rinse  15 mL Mouth Rinse Q4H   pantoprazole (PROTONIX) IV  40 mg Intravenous Q12H   Continuous Infusions:  dextrose 5% lactated ringers Stopped (06/08/22 1621)   micafungin (MYCAMINE) 100 mg in sodium chloride 0.9 % 100 mL IVPB Stopped (06/08/22 1221)   piperacillin-tazobactam (ZOSYN)  IV 3.375 g (06/08/22 1757)    Procedures/Studies: DG UGI W SMALL BOWEL  Result Date: 06/08/2022 CLINICAL DATA:  Post repair of 1.5 cm diameter perforated anterior wall gastric antral ulcer EXAM: WATER SOLUBLE UPPER GI SERIES TECHNIQUE: Single-column upper GI series was performed using water soluble contrast. Radiation Exposure Index (as provided by the fluoroscopic device): 43.1 mGy Kerma CONTRAST:  100 cc Omnipaque 300 via NG tube COMPARISON:  CT abdomen and pelvis 06/03/2022 FLUOROSCOPY: Fluoroscopy Time:  2 minutes 36 seconds Radiation Exposure Index (if provided by the fluoroscopic device): 43.1 Number of Acquired Spot Images: Multiple fluoroscopic screen captures and cine series FINDINGS: Contrast administered via NG tube  opacifies the gastric lumen. Patent pylorus with passage of contrast through normal appearing duodenal C loop into proximal jejunum with incidentally noted duodenal diverticulum at second portion. Irregularity of rugal folds with thickening and distortion at anterior wall of gastric antrum at site of perforation repair. The third portion of the duodenum is superimposed with the inferior wall of the gastric antrum, mildly limiting exam. A blush of contrast is seen inferior to the antrum, superimposed with the third portion of the duodenum throughout the study. This appears to move in concert with the duodenum with respiration. No contrast opacification of the JP drain. No gross evidence of contrast leak identified though it is difficult to completely exclude a small leak at the site of repair. IMPRESSION: Distortion and thickening of rugal folds at site of antral gastric ulcer repair. No gross evidence of leak at the site of ulcer repair as discussed above. Electronically Signed   By: Lavonia Dana M.D.   On: 06/08/2022 15:32   ECHOCARDIOGRAM COMPLETE  Result Date: 06/07/2022    ECHOCARDIOGRAM REPORT   Patient Name:   Carolyn Erickson Date of Exam: 06/07/2022 Medical Rec #:  [id number]       Height:       61.0 in Accession #:    [id number]  Weight:       112.2 lb Date of Birth:  1946-07-29        BSA:          1.477 m Patient Age:    77 years        BP:           116/46 mmHg Patient Gender: F               HR:           104 bpm. Exam Location:  [facility] Procedure: 2D Echo, Cardiac Doppler and Color Doppler Indications:    Bacteremia  History:        Patient has no prior history of Echocardiogram examinations.                 Signs/Symptoms:Bacteremia; Risk Factors:Hypertension and                 Dyslipidemia. Breast CA.  Sonographer:    Wenda Low Referring Phys: [id number] [place]  1. Abnormal septal motion . Left ventricular ejection fraction, by estimation, is 55 to 60%. The left ventricle has  normal function. The left ventricle has no regional wall motion abnormalities. Left ventricular diastolic parameters were normal.  2. Right ventricular systolic function is normal. The right ventricular size is normal. There is mildly elevated pulmonary artery systolic pressure.  3. Moderate pleural effusion in the left lateral region.  4. The mitral valve is abnormal. Trivial mitral valve regurgitation. No evidence of mitral stenosis.  5. Thickening and calcification noted . The tricuspid valve is abnormal.  6. Some thickening in the LVOT near intervalvular fibrosa Given this and nodular calcification of PV suggest TEE if suspicion for SBE high. The aortic valve is tricuspid. There is moderate calcification of the aortic valve. There is moderate thickening of the aortic valve. Aortic valve regurgitation is mild. Aortic valve sclerosis/calcification is present, without any evidence of aortic stenosis.  7. Nodular calcification seen on PV. The pulmonic valve was abnormal.  8. The inferior vena cava is normal in size with greater than 50% respiratory variability, suggesting right atrial pressure of 3 mmHg. FINDINGS  Left Ventricle: Abnormal septal motion. Left ventricular ejection fraction, by estimation, is 55 to 60%. The left ventricle has normal function. The left ventricle has no regional wall motion abnormalities. The left ventricular internal cavity size was normal in size. There is no left ventricular hypertrophy. Left ventricular diastolic parameters were normal. Right Ventricle: The right ventricular size is normal. No increase in right ventricular wall thickness. Right ventricular systolic function is normal. There is mildly elevated pulmonary artery systolic pressure. The tricuspid regurgitant velocity is 3.21  m/s, and with an assumed right atrial pressure of 3 mmHg, the estimated right ventricular systolic pressure is 50.2 mmHg. Left Atrium: Left atrial size was normal in size. Right Atrium: Right atrial  size was normal in size. Pericardium: There is no evidence of pericardial effusion. Mitral Valve: The mitral valve is abnormal. There is mild thickening of the mitral valve leaflet(s). There is mild calcification of the mitral valve leaflet(s). Mild mitral annular calcification. Trivial mitral valve regurgitation. No evidence of mitral valve stenosis. MV peak gradient, 3.9 mmHg. The mean mitral valve gradient is 1.0 mmHg. Tricuspid Valve: Thickening and calcification noted. The tricuspid valve is abnormal. Tricuspid valve regurgitation is trivial. No evidence of tricuspid stenosis. Aortic Valve: Some thickening in the LVOT near intervalvular fibrosa Given this and nodular calcification of PV suggest  TEE if suspicion for SBE high. The aortic valve is tricuspid. There is moderate calcification of the aortic valve. There is moderate thickening of the aortic valve. Aortic valve regurgitation is mild. Aortic valve sclerosis/calcification is present, without any evidence of aortic stenosis. Aortic valve mean gradient measures 5.0 mmHg. Aortic valve peak gradient measures 10.3 mmHg. Aortic valve area, by VTI measures 2.02 cm. Pulmonic Valve: Nodular calcification seen on PV. The pulmonic valve was abnormal. Pulmonic valve regurgitation is trivial. No evidence of pulmonic stenosis. Aorta: The aortic root is normal in size and structure. Venous: The inferior vena cava is normal in size with greater than 50% respiratory variability, suggesting right atrial pressure of 3 mmHg. IAS/Shunts: No atrial level shunt detected by color flow Doppler. Additional Comments: There is a moderate pleural effusion in the left lateral region.  LEFT VENTRICLE PLAX 2D LVIDd:         4.50 cm     Diastology LVIDs:         3.00 cm     LV e' medial:    7.29 cm/s LV PW:         0.80 cm     LV E/e' medial:  11.8 LV IVS:        0.80 cm     LV e' lateral:   9.79 cm/s LVOT diam:     1.80 cm     LV E/e' lateral: 8.8 LV SV:         55 LV SV Index:   37  LVOT Area:     2.54 cm  LV Volumes (MOD) LV vol d, MOD A2C: 43.7 ml LV vol d, MOD A4C: 52.7 ml LV vol s, MOD A2C: 17.3 ml LV vol s, MOD A4C: 23.8 ml LV SV MOD A2C:     26.4 ml LV SV MOD A4C:     52.7 ml LV SV MOD BP:      28.6 ml RIGHT VENTRICLE RV Basal diam:  3.10 cm RV Mid diam:    2.90 cm RV S prime:     14.50 cm/s TAPSE (M-mode): 3.0 cm LEFT ATRIUM             Index        RIGHT ATRIUM           Index LA diam:        3.60 cm 2.44 cm/m   RA Area:     11.50 cm LA Vol (A2C):   50.9 ml 34.45 ml/m  RA Volume:   21.80 ml  14.76 ml/m LA Vol (A4C):   43.7 ml 29.58 ml/m LA Biplane Vol: 49.8 ml 33.71 ml/m  AORTIC VALVE                     PULMONIC VALVE AV Area (Vmax):    1.92 cm      PV Vmax:       0.98 m/s AV Area (Vmean):   1.84 cm      PV Peak grad:  3.9 mmHg AV Area (VTI):     2.02 cm AV Vmax:           160.50 cm/s AV Vmean:          103.550 cm/s AV VTI:            0.274 m AV Peak Grad:      10.3 mmHg AV Mean Grad:      5.0 mmHg LVOT Vmax:  121.00 cm/s LVOT Vmean:        74.800 cm/s LVOT VTI:          0.218 m LVOT/AV VTI ratio: 0.79  AORTA Ao Root diam: 3.30 cm MITRAL VALVE               TRICUSPID VALVE MV Area (PHT): 6.02 cm    TR Peak grad:   41.2 mmHg MV Area VTI:   2.25 cm    TR Vmax:        321.00 cm/s MV Peak grad:  3.9 mmHg MV Mean grad:  1.0 mmHg    SHUNTS MV Vmax:       0.99 m/s    Systemic VTI:  0.22 m MV Vmean:      48.8 cm/s   Systemic Diam: 1.80 cm MV Decel Time: 126 msec MV E velocity: 86.00 cm/s MV A velocity: 72.70 cm/s MV E/A ratio:  1.18 Jenkins Rouge MD Electronically signed by Jenkins Rouge MD Signature Date/Time: 06/07/2022/10:28:36 AM    Final    DG CHEST PORT 1 VIEW  Result Date: 06/05/2022 CLINICAL DATA:  76 year old female with respiratory failure. Intubated. EXAM: PORTABLE CHEST 1 VIEW COMPARISON:  Portable chest 06/04/2022 and earlier. FINDINGS: Portable AP semi upright view at 0527 hours. Endotracheal tube tip in good position between the clavicles and carina. Stable  left IJ central line and visible enteric tube. Increased left lung base and retrocardiac opacity now mostly obscuring the left hemidiaphragm. Slightly lower lung volumes overall. Mediastinal contours remain normal. No pneumothorax, pulmonary edema, or definite effusion. Negative right lung. Partially visible midline abdominal skin staples. Paucity of bowel gas in the upper abdomen. Stable left chest and axillary surgical clips. IMPRESSION: 1. Stable lines and tubes. 2. Increased left lower lobe collapse or consolidation since yesterday. Electronically Signed   By: Genevie Ann M.D.   On: 06/05/2022 08:26   DG Chest Port 1 View  Result Date: 06/04/2022 CLINICAL DATA:  Endotracheal tube, nasogastric tube and central line placement. EXAM: PORTABLE CHEST 1 VIEW COMPARISON:  June 03, 2022 FINDINGS: Since the prior study there has been interval placement of an endotracheal tube. Its distal tip is seen approximately 3.8 cm from the carina. Interval nasogastric tube placement is also seen. Its distal and extends into the gastric lumen. There is a left internal jugular venous catheter with its distal tip overlying the distal superior vena cava. This is approximately 6 mm proximal to the junction of the superior vena cava and right atrium. The heart size and mediastinal contours are within normal limits. Mild to moderate severity atelectasis and/or infiltrate is seen within the left lung base. This represents a new finding when compared to the prior study. There is no evidence of a pleural effusion or pneumothorax. Radiopaque surgical clips are seen along the left axilla. The air seen below the right hemidiaphragm on the prior study is no longer visualized. The visualized skeletal structures are unremarkable. IMPRESSION: 1. Interval endotracheal tube, nasogastric tube and left internal jugular venous catheter placement positioning, as described above. 2. Mild to moderate severity left basilar atelectasis and/or infiltrate.  Electronically Signed   By: Virgina Norfolk M.D.   On: 06/04/2022 02:47   CT Abdomen Pelvis Wo Contrast  Result Date: 06/03/2022 CLINICAL DATA:  Severe weakness for 2 days with acute abdominal pain, initial encounter EXAM: CT ABDOMEN AND PELVIS WITHOUT CONTRAST TECHNIQUE: Multidetector CT imaging of the abdomen and pelvis was performed following the standard protocol without IV contrast. RADIATION DOSE  REDUCTION: This exam was performed according to the departmental dose-optimization program which includes automated exposure control, adjustment of the mA and/or kV according to patient size and/or use of iterative reconstruction technique. COMPARISON:  None Available. FINDINGS: Lower chest: Basilar atelectatic changes are noted. Hepatobiliary: Liver is within normal limits. Gallbladder demonstrates a normal appearance. Pancreas: Unremarkable. No pancreatic ductal dilatation or surrounding inflammatory changes. Spleen: Normal in size without focal abnormality. Adrenals/Urinary Tract: Adrenal glands are within normal limits. Kidneys demonstrate tiny nonobstructing stones on right in the lower pole. No ureteral obstruction is seen. The bladder is decompressed. Stomach/Bowel: Considerable fecal material is noted within the rectal vault which may represent a focal impaction. Diverticular change of the colon is noted as well as some generalized wall thickening throughout the colon. The appendix is within normal limits. Small bowel is within normal limits. Stomach demonstrates a defect in the anterior gastric wall consistent with a perforated ulcer. There is a considerable amount of air and fluid throughout the abdomen with apparent direct communication with the gastric best seen on image number 36 of series 2 and image number 78 of series 6. Vascular/Lymphatic: Aortic atherosclerosis. No enlarged abdominal or pelvic lymph nodes. Reproductive: Status post hysterectomy. No adnexal masses. Other: Free air and free fluid  similar to that described above throughout the abdomen. The bowel demonstrates some reactive wall thickening related to the fluid and air. Musculoskeletal: Degenerative changes of the lumbar spine are seen. No other bony abnormality is noted. IMPRESSION: Changes consistent with a perforated gastric ulcer anteriorly with considerable spillage of fluid and air into the abdominal cavity. Some reactive wall thickening is noted within the bowel loops secondary to these changes. Critical Value/emergent results were called by telephone at the time of interpretation on 06/03/2022 at 10:43 pm to Dr. Aletta Edouard , who verbally acknowledged these results. Electronically Signed   By: Inez Catalina M.D.   On: 06/03/2022 22:45   DG Chest Port 1 View  Result Date: 06/03/2022 CLINICAL DATA:  Weakness. EXAM: PORTABLE CHEST 1 VIEW COMPARISON:  None Available. FINDINGS: Multiple overlying radiopaque cardiac lead wires are seen. The heart size and mediastinal contours are within normal limits. Mild, diffuse, chronic appearing increased lung markings are seen. There is no evidence of an acute infiltrate, pleural effusion or pneumothorax. Radiopaque surgical clips are seen along the lateral aspect of the left chest wall. A crescentic area of air is seen just below the right hemidiaphragm. Degenerative changes seen throughout the thoracic spine. IMPRESSION: 1. Chronic appearing increased lung markings without evidence of acute or active cardiopulmonary disease. 2. Air seen just below the right hemidiaphragm which may represent an air filled loop of colon. Further evaluation with abdomen pelvis CT is recommended, as the presence of intra-abdominal free air cannot be excluded. Electronically Signed   By: Virgina Norfolk M.D.   On: 06/03/2022 21:22    Time spent: 38 mins   Jaydalynn Olivero Wynetta Emery, MD How to contact the [facility] Attending or Consulting provider [place] or covering provider during after hours [place], for this patient?  Check  the care team in [facility] and look for a) attending/consulting [facility] provider listed and b) the [facility] team listed Log into [email or web address] and use [place]'s universal password to access. If you do not have the password, please contact the hospital operator. Locate the [facility] provider you are looking for under [facility] and page to a number that you can be directly reached. If you still have difficulty reaching the provider,  please page the [facility] (Director on Call) for the Hospitalists listed on amion for assistance.   [facility]  If 7PM-7AM, please contact night-coverage [email or web address] Password [email or web address] 06/08/2022, 6:04 PM   LOS: 4 days

## 2022-06-08 NOTE — Progress Notes (Addendum)
[facility] Infectious Diseases Follow Up Note  Patient Identification: Patient Name: Carolyn Erickson MRN: [id number] [place] Date: 06/03/2022  8:08 PM Age: 76 y.o.Today's Date: 06/08/2022  Reason for Visit: fungemia and gastric perforation   Principal Problem:   Septic shock ([place]) Active Problems:   Mixed hyperlipidemia   Essential hypertension   Gastric perforation (HCC)   GERD (gastroesophageal reflux disease)   Acute renal failure superimposed on stage 3a chronic kidney disease (HCC)   Major neurocognitive disorder ([place])   Acute respiratory failure with hypoxia (HCC)   Hypomagnesemia   Fungemia  Antibiotics:  Cefepime 7/1 Metronidazole 7/1 Fluconazole 7/1-7/3, Micafungin 7/4-c  Lines/Hardwares: RT abdominal drain  Interval Events: afebrile, WBC 25>23. Poor TEE candidate   Assessment Candida glabrata fungemia 2/2 below  - repeat blood cx 7/4 NGTD - TTE 7/6 with thickening in the LVOT,  nodular calcification of PV, moderate calcification of AV, moderate thickening of A, Aortic sclerosis/calcification. No prior TTE to compare  - Poor TEE candidate 2/2 below  Intrabadominal abscess 2/2 gastric perforation/Peritonitis - s/p Exploratory laparotomy, gastrorrhaphy, omental patch, central line placement (left IJ). CVC appears to have been removed 7/4. Cx with candida albicans  - UGI without leak and plan for DC NGT and starting CLD noted   3. AKI on CKD- Cr seems to be down trending 4. Thrombocytopenia - improving  5. Encephalopathy with neurocognitive d/o- seems to have resolved  6. Acute hypoxic respiratory failure: down to 2L [place]  Recommendations Continue Micafungin as is pending sensi of C glabrata  Continue pip/tazo  Fu blood cultures and OR cultures  Hold off on PICC line if hemodynamics allows to ensure clearance of fungemia  If candida glabrata S to Fluconazole, we may be able to switch micafungin to high dose  fluconazole Monitor CBC and BMP  D/w primary   Rest of the management as per the primary team. Thank you for the consult. Please page with pertinent questions or concerns.  ______________________________________________________________________ Subjective Patient not seen, chart reviewed.  Past Medical History:  Diagnosis Date   Cancer [facility]) 2001   breast left   Depression    Hypertension    Past Surgical History:  Procedure Laterality Date   ABDOMINAL HYSTERECTOMY     BACK SURGERY     LAPAROTOMY N/A 06/04/2022   Procedure: EXPLORATORY LAPAROTOMY, omental patch;  Surgeon: Virl Cagey, MD;  Location: [facility];  Service: General;  Laterality: N/A;    Vitals BP (!) 135/58   Pulse 99   Temp 98.6 F (37 C) (Axillary)   Resp 20   Ht '5\' 1"'$  (1.549 m)   Wt 67.8 kg   SpO2 98%   BMI 28.24 kg/m   Pertinent Microbiology Results for orders placed or performed during the hospital encounter of 06/03/22  Cath Tip Culture     Status: None (Preliminary result)   Collection Time: 06/03/22  8:07 PM   Specimen: Catheter Tip  Result Value Ref Range Status   Specimen Description CATH TIP  Final   Special Requests NONE  Final   Culture   Final    NO GROWTH 2 DAYS Performed at [facility], [place] [address] [place], [place] [address]    Report Status PENDING  Incomplete  Culture, blood (Routine x 2)     Status: Abnormal (Preliminary result)   Collection Time: 06/03/22  8:45 PM   Specimen: BLOOD  Result Value Ref Range Status   Specimen Description   Final  BLOOD LEFT ANTECUBITAL Performed at [facility], [place] [address] [place], [place] [address]    Special Requests   Final    BOTTLES DRAWN AEROBIC AND ANAEROBIC Blood Culture adequate volume Performed at [facility], [address] [place], [place] [address]    Culture  Setup Time   Final    YEAST BOTH Gram Stain Report Called to,Read Back By and Verified With: JACOBS, U'@0745'$  BY MATTHEWS, B 7.4.2023 CRITICAL  RESULT CALLED TO, READ BACK BY AND VERIFIED WITH: PHARMD S.HURTZ AT 1130 ON 06/06/2022 BY T.SAAD.    Culture (A)  Final    CANDIDA GLABRATA Sent to [place] for further susceptibility testing. Performed at [facility], [place] [address], [place], [place] [address]    Report Status PENDING  Incomplete  Blood Culture ID Panel (Reflexed)     Status: Abnormal   Collection Time: 06/03/22  8:45 PM  Result Value Ref Range Status   Enterococcus faecalis NOT DETECTED NOT DETECTED Final   Enterococcus Faecium NOT DETECTED NOT DETECTED Final   Listeria monocytogenes NOT DETECTED NOT DETECTED Final   Staphylococcus species NOT DETECTED NOT DETECTED Final   Staphylococcus aureus (BCID) NOT DETECTED NOT DETECTED Final   Staphylococcus epidermidis NOT DETECTED NOT DETECTED Final   Staphylococcus lugdunensis NOT DETECTED NOT DETECTED Final   Streptococcus species NOT DETECTED NOT DETECTED Final   Streptococcus agalactiae NOT DETECTED NOT DETECTED Final   Streptococcus pneumoniae NOT DETECTED NOT DETECTED Final   Streptococcus pyogenes NOT DETECTED NOT DETECTED Final   A.calcoaceticus-baumannii NOT DETECTED NOT DETECTED Final   Bacteroides fragilis NOT DETECTED NOT DETECTED Final   Enterobacterales NOT DETECTED NOT DETECTED Final   Enterobacter cloacae complex NOT DETECTED NOT DETECTED Final   Escherichia coli NOT DETECTED NOT DETECTED Final   Klebsiella aerogenes NOT DETECTED NOT DETECTED Final   Klebsiella oxytoca NOT DETECTED NOT DETECTED Final   Klebsiella pneumoniae NOT DETECTED NOT DETECTED Final   Proteus species NOT DETECTED NOT DETECTED Final   Salmonella species NOT DETECTED NOT DETECTED Final   Serratia marcescens NOT DETECTED NOT DETECTED Final   Haemophilus influenzae NOT DETECTED NOT DETECTED Final   Neisseria meningitidis NOT DETECTED NOT DETECTED Final   Pseudomonas aeruginosa NOT DETECTED NOT DETECTED Final   Stenotrophomonas maltophilia NOT DETECTED NOT DETECTED Final    Candida albicans NOT DETECTED NOT DETECTED Final   Candida auris NOT DETECTED NOT DETECTED Final   Candida glabrata DETECTED (A) NOT DETECTED Final    Comment: CRITICAL RESULT CALLED TO, READ BACK BY AND VERIFIED WITH: PHARMD S.HURTZ AT 1130 ON 06/06/2022 BY T.SAAD.    Candida krusei NOT DETECTED NOT DETECTED Final   Candida parapsilosis NOT DETECTED NOT DETECTED Final   Candida tropicalis NOT DETECTED NOT DETECTED Final   Cryptococcus neoformans/gattii NOT DETECTED NOT DETECTED Final    Comment: Performed at [facility], [place] [address], [place], [place] [address]  Culture, blood (Routine x 2)     Status: Abnormal (Preliminary result)   Collection Time: 06/03/22  9:32 PM   Specimen: Left Antecubital; Blood  Result Value Ref Range Status   Specimen Description   Final    LEFT ANTECUBITAL Performed at [facility], [address] [place], [place] [address]    Special Requests   Final    BOTTLES DRAWN AEROBIC AND ANAEROBIC Blood Culture adequate volume Performed at [facility], [address] [place], [place] [address]    Culture  Setup Time   Final  AEROBIC BOTTLE ONLY YEAST Gram Stain Report Called to,Read Back By and Verified With: UTE JACOBS @ 1200 ON 06/06/22 C [facility] CRITICAL VALUE NOTED.  VALUE IS CONSISTENT WITH PREVIOUSLY REPORTED AND CALLED VALUE. Performed at [facility], [place] [address] [place], [place] [address]    Culture CANDIDA GLABRATA (A)  Final   Report Status PENDING  Incomplete  MRSA Next Gen by PCR, Nasal     Status: None   Collection Time: 06/04/22  2:18 AM   Specimen: Nasal Mucosa; Nasal Swab  Result Value Ref Range Status   MRSA by PCR Next Gen NOT DETECTED NOT DETECTED Final    Comment: (NOTE) The GeneXpert MRSA Assay (FDA approved for NASAL specimens only), is one component of a comprehensive MRSA colonization surveillance program. It is not intended to diagnose MRSA infection nor to guide or monitor treatment for MRSA infections. Test  performance is not FDA approved in patients less than 43 years old. Performed at [facility], [address] [place], [place] [address]   Urine Culture     Status: Abnormal   Collection Time: 06/04/22  2:28 AM   Specimen: PATH Cytology Urine  Result Value Ref Range Status   Specimen Description   Final    URINE, CLEAN CATCH Performed at [facility], [address] [place], [place] [address]    Special Requests   Final    NONE Performed at [facility], [address] [place], [place] [address]    Culture (A)  Final    <10,000 COLONIES/mL INSIGNIFICANT GROWTH Performed at [place] [address] [place], [place] [address]    Report Status 06/05/2022 FINAL  Final  Aerobic/Anaerobic Culture w Gram Stain (surgical/deep wound)     Status: None (Preliminary result)   Collection Time: 06/04/22  3:03 AM   Specimen: PATH Cytology FNA; Body Fluid  Result Value Ref Range Status   Specimen Description   Final    ANAL Performed at [facility], [address] [place], [place] [address]    Special Requests   Final    NONE Performed at [facility], [address], [place], [place] [address]    Gram Stain   Final    MODERATE WBC PRESENT,BOTH PMN AND MONONUCLEAR FEW GRAM POSITIVE COCCI IN PAIRS MODERATE GRAM POSITIVE RODS Performed at [facility], [place] [address], [place], [place] [address]    Culture   Final    FEW CANDIDA ALBICANS NO ANAEROBES ISOLATED; CULTURE IN PROGRESS FOR 5 DAYS    Report Status PENDING  Incomplete  Culture, blood (Routine X 2) w Reflex to ID Panel     Status: None (Preliminary result)   Collection Time: 06/06/22  9:32 AM   Specimen: BLOOD LEFT WRIST  Result Value Ref Range Status   Specimen Description BLOOD LEFT WRIST  Final   Special Requests   Final    BOTTLES DRAWN AEROBIC ONLY Blood Culture results may not be optimal due to an inadequate volume of blood received in culture bottles   Culture   Final    NO GROWTH 2 DAYS Performed at  [facility], [address], [place],  [address]    Report Status PENDING  Incomplete  Culture, blood (Routine X 2) w Reflex to ID Panel     Status: None (Preliminary result)   Collection Time: 06/06/22  9:33 AM   Specimen: Left Antecubital; Blood  Result Value Ref Range Status   Specimen Description LEFT  ANTECUBITAL  Final   Special Requests   Final    BOTTLES DRAWN AEROBIC AND ANAEROBIC Blood Culture adequate volume   Culture   Final    NO GROWTH 2 DAYS Performed at [facility], [address] [place], [place] [address]    Report Status PENDING  Incomplete   Pertinent Lab.    Latest Ref Rng & Units 06/08/2022    3:44 AM 06/07/2022    4:12 AM 06/06/2022    3:52 AM  CBC  WBC 4.0 - 10.5 K/uL 23.0  25.2  24.0   Hemoglobin 12.0 - 15.0 g/dL 11.7  11.8  10.9   Hematocrit 36.0 - 46.0 % 37.9  37.6  33.7   Platelets 150 - 400 K/uL 132  121  137       Latest Ref Rng & Units 06/08/2022    3:44 AM 06/07/2022    4:12 AM 06/06/2022    3:52 AM  CMP  Glucose 70 - 99 mg/dL 72  125  125   BUN 8 - 23 mg/dL 47  43  50   Creatinine 0.44 - 1.00 mg/dL 1.82  1.81  2.05   Sodium 135 - 145 mmol/L 140  137  134   Potassium 3.5 - 5.1 mmol/L 4.7  4.4  4.6   Chloride 98 - 111 mmol/L 111  108  105   CO2 22 - 32 mmol/L '23  24  24   '$ Calcium 8.9 - 10.3 mg/dL 8.4  8.1  7.8   Total Protein 6.5 - 8.1 g/dL 4.4  4.1  3.9   Total Bilirubin 0.3 - 1.2 mg/dL 2.7  2.4  1.8   Alkaline Phos 38 - 126 U/L 79  74  56   AST 15 - 41 U/L '16  17  17   '$ ALT 0 - 44 U/L '13  14  15      '$ Pertinent Imaging today Plain films and CT images have been personally visualized and interpreted; radiology reports have been reviewed. Decision making incorporated into the Impression / Recommendations.  DG UGI W SMALL BOWEL  Result Date: 06/08/2022 CLINICAL DATA:  Post repair of 1.5 cm diameter perforated anterior wall gastric antral ulcer EXAM: WATER SOLUBLE UPPER GI SERIES TECHNIQUE: Single-column upper GI series was performed using  water soluble contrast. Radiation Exposure Index (as provided by the fluoroscopic device): 43.1 mGy Kerma CONTRAST:  100 cc Omnipaque 300 via NG tube COMPARISON:  CT abdomen and pelvis 06/03/2022 FLUOROSCOPY: Fluoroscopy Time:  2 minutes 36 seconds Radiation Exposure Index (if provided by the fluoroscopic device): 43.1 Number of Acquired Spot Images: Multiple fluoroscopic screen captures and cine series FINDINGS: Contrast administered via NG tube opacifies the gastric lumen. Patent pylorus with passage of contrast through normal appearing duodenal C loop into proximal jejunum with incidentally noted duodenal diverticulum at second portion. Irregularity of rugal folds with thickening and distortion at anterior wall of gastric antrum at site of perforation repair. The third portion of the duodenum is superimposed with the inferior wall of the gastric antrum, mildly limiting exam. A blush of contrast is seen inferior to the antrum, superimposed with the third portion of the duodenum throughout the study. This appears to move in concert with the duodenum with respiration. No contrast opacification of the JP drain. No gross evidence of contrast leak identified though it is difficult to completely exclude a small leak at the site of repair. IMPRESSION: Distortion and thickening of rugal folds at site of antral gastric ulcer  repair. No gross evidence of leak at the site of ulcer repair as discussed above. Electronically Signed   By: Lavonia Dana M.D.   On: 06/08/2022 15:32   ECHOCARDIOGRAM COMPLETE  Result Date: 06/07/2022    ECHOCARDIOGRAM REPORT   Patient Name:   JAID QUIRION Date of Exam: 06/07/2022 Medical Rec #:  [id number]       Height:       61.0 in Accession #:    [id number]      Weight:       112.2 lb Date of Birth:  11/27/1946        BSA:          1.477 m Patient Age:    14 years        BP:           116/46 mmHg Patient Gender: F               HR:           104 bpm. Exam Location:  [facility] Procedure: 2D Echo,  Cardiac Doppler and Color Doppler Indications:    Bacteremia  History:        Patient has no prior history of Echocardiogram examinations.                 Signs/Symptoms:Bacteremia; Risk Factors:Hypertension and                 Dyslipidemia. Breast CA.  Sonographer:    Wenda Low Referring Phys: [id number] [place]  1. Abnormal septal motion . Left ventricular ejection fraction, by estimation, is 55 to 60%. The left ventricle has normal function. The left ventricle has no regional wall motion abnormalities. Left ventricular diastolic parameters were normal.  2. Right ventricular systolic function is normal. The right ventricular size is normal. There is mildly elevated pulmonary artery systolic pressure.  3. Moderate pleural effusion in the left lateral region.  4. The mitral valve is abnormal. Trivial mitral valve regurgitation. No evidence of mitral stenosis.  5. Thickening and calcification noted . The tricuspid valve is abnormal.  6. Some thickening in the LVOT near intervalvular fibrosa Given this and nodular calcification of PV suggest TEE if suspicion for SBE high. The aortic valve is tricuspid. There is moderate calcification of the aortic valve. There is moderate thickening of the aortic valve. Aortic valve regurgitation is mild. Aortic valve sclerosis/calcification is present, without any evidence of aortic stenosis.  7. Nodular calcification seen on PV. The pulmonic valve was abnormal.  8. The inferior vena cava is normal in size with greater than 50% respiratory variability, suggesting right atrial pressure of 3 mmHg. FINDINGS  Left Ventricle: Abnormal septal motion. Left ventricular ejection fraction, by estimation, is 55 to 60%. The left ventricle has normal function. The left ventricle has no regional wall motion abnormalities. The left ventricular internal cavity size was normal in size. There is no left ventricular hypertrophy. Left ventricular diastolic parameters were normal.  Right Ventricle: The right ventricular size is normal. No increase in right ventricular wall thickness. Right ventricular systolic function is normal. There is mildly elevated pulmonary artery systolic pressure. The tricuspid regurgitant velocity is 3.21  m/s, and with an assumed right atrial pressure of 3 mmHg, the estimated right ventricular systolic pressure is 90.2 mmHg. Left Atrium: Left atrial size was normal in size. Right Atrium: Right atrial size was normal in size. Pericardium: There is no evidence of pericardial effusion. Mitral Valve: The mitral valve  is abnormal. There is mild thickening of the mitral valve leaflet(s). There is mild calcification of the mitral valve leaflet(s). Mild mitral annular calcification. Trivial mitral valve regurgitation. No evidence of mitral valve stenosis. MV peak gradient, 3.9 mmHg. The mean mitral valve gradient is 1.0 mmHg. Tricuspid Valve: Thickening and calcification noted. The tricuspid valve is abnormal. Tricuspid valve regurgitation is trivial. No evidence of tricuspid stenosis. Aortic Valve: Some thickening in the LVOT near intervalvular fibrosa Given this and nodular calcification of PV suggest TEE if suspicion for SBE high. The aortic valve is tricuspid. There is moderate calcification of the aortic valve. There is moderate thickening of the aortic valve. Aortic valve regurgitation is mild. Aortic valve sclerosis/calcification is present, without any evidence of aortic stenosis. Aortic valve mean gradient measures 5.0 mmHg. Aortic valve peak gradient measures 10.3 mmHg. Aortic valve area, by VTI measures 2.02 cm. Pulmonic Valve: Nodular calcification seen on PV. The pulmonic valve was abnormal. Pulmonic valve regurgitation is trivial. No evidence of pulmonic stenosis. Aorta: The aortic root is normal in size and structure. Venous: The inferior vena cava is normal in size with greater than 50% respiratory variability, suggesting right atrial pressure of 3 mmHg.  IAS/Shunts: No atrial level shunt detected by color flow Doppler. Additional Comments: There is a moderate pleural effusion in the left lateral region.  LEFT VENTRICLE PLAX 2D LVIDd:         4.50 cm     Diastology LVIDs:         3.00 cm     LV e' medial:    7.29 cm/s LV PW:         0.80 cm     LV E/e' medial:  11.8 LV IVS:        0.80 cm     LV e' lateral:   9.79 cm/s LVOT diam:     1.80 cm     LV E/e' lateral: 8.8 LV SV:         55 LV SV Index:   37 LVOT Area:     2.54 cm  LV Volumes (MOD) LV vol d, MOD A2C: 43.7 ml LV vol d, MOD A4C: 52.7 ml LV vol s, MOD A2C: 17.3 ml LV vol s, MOD A4C: 23.8 ml LV SV MOD A2C:     26.4 ml LV SV MOD A4C:     52.7 ml LV SV MOD BP:      28.6 ml RIGHT VENTRICLE RV Basal diam:  3.10 cm RV Mid diam:    2.90 cm RV S prime:     14.50 cm/s TAPSE (M-mode): 3.0 cm LEFT ATRIUM             Index        RIGHT ATRIUM           Index LA diam:        3.60 cm 2.44 cm/m   RA Area:     11.50 cm LA Vol (A2C):   50.9 ml 34.45 ml/m  RA Volume:   21.80 ml  14.76 ml/m LA Vol (A4C):   43.7 ml 29.58 ml/m LA Biplane Vol: 49.8 ml 33.71 ml/m  AORTIC VALVE                     PULMONIC VALVE AV Area (Vmax):    1.92 cm      PV Vmax:       0.98 m/s AV Area (Vmean):   1.84 cm  PV Peak grad:  3.9 mmHg AV Area (VTI):     2.02 cm AV Vmax:           160.50 cm/s AV Vmean:          103.550 cm/s AV VTI:            0.274 m AV Peak Grad:      10.3 mmHg AV Mean Grad:      5.0 mmHg LVOT Vmax:         121.00 cm/s LVOT Vmean:        74.800 cm/s LVOT VTI:          0.218 m LVOT/AV VTI ratio: 0.79  AORTA Ao Root diam: 3.30 cm MITRAL VALVE               TRICUSPID VALVE MV Area (PHT): 6.02 cm    TR Peak grad:   41.2 mmHg MV Area VTI:   2.25 cm    TR Vmax:        321.00 cm/s MV Peak grad:  3.9 mmHg MV Mean grad:  1.0 mmHg    SHUNTS MV Vmax:       0.99 m/s    Systemic VTI:  0.22 m MV Vmean:      48.8 cm/s   Systemic Diam: 1.80 cm MV Decel Time: 126 msec MV E velocity: 86.00 cm/s MV A velocity: 72.70 cm/s MV E/A ratio:   1.18 Jenkins Rouge MD Electronically signed by Jenkins Rouge MD Signature Date/Time: 06/07/2022/10:28:36 AM    Final    DG CHEST PORT 1 VIEW  Result Date: 06/05/2022 CLINICAL DATA:  76 year old female with respiratory failure. Intubated. EXAM: PORTABLE CHEST 1 VIEW COMPARISON:  Portable chest 06/04/2022 and earlier. FINDINGS: Portable AP semi upright view at 0527 hours. Endotracheal tube tip in good position between the clavicles and carina. Stable left IJ central line and visible enteric tube. Increased left lung base and retrocardiac opacity now mostly obscuring the left hemidiaphragm. Slightly lower lung volumes overall. Mediastinal contours remain normal. No pneumothorax, pulmonary edema, or definite effusion. Negative right lung. Partially visible midline abdominal skin staples. Paucity of bowel gas in the upper abdomen. Stable left chest and axillary surgical clips. IMPRESSION: 1. Stable lines and tubes. 2. Increased left lower lobe collapse or consolidation since yesterday. Electronically Signed   By: Genevie Ann M.D.   On: 06/05/2022 08:26   DG Chest Port 1 View  Result Date: 06/04/2022 CLINICAL DATA:  Endotracheal tube, nasogastric tube and central line placement. EXAM: PORTABLE CHEST 1 VIEW COMPARISON:  June 03, 2022 FINDINGS: Since the prior study there has been interval placement of an endotracheal tube. Its distal tip is seen approximately 3.8 cm from the carina. Interval nasogastric tube placement is also seen. Its distal and extends into the gastric lumen. There is a left internal jugular venous catheter with its distal tip overlying the distal superior vena cava. This is approximately 6 mm proximal to the junction of the superior vena cava and right atrium. The heart size and mediastinal contours are within normal limits. Mild to moderate severity atelectasis and/or infiltrate is seen within the left lung base. This represents a new finding when compared to the prior study. There is no evidence of a  pleural effusion or pneumothorax. Radiopaque surgical clips are seen along the left axilla. The air seen below the right hemidiaphragm on the prior study is no longer visualized. The visualized skeletal structures are unremarkable. IMPRESSION: 1. Interval endotracheal tube, nasogastric tube  and left internal jugular venous catheter placement positioning, as described above. 2. Mild to moderate severity left basilar atelectasis and/or infiltrate. Electronically Signed   By: Virgina Norfolk M.D.   On: 06/04/2022 02:47   CT Abdomen Pelvis Wo Contrast  Result Date: 06/03/2022 CLINICAL DATA:  Severe weakness for 2 days with acute abdominal pain, initial encounter EXAM: CT ABDOMEN AND PELVIS WITHOUT CONTRAST TECHNIQUE: Multidetector CT imaging of the abdomen and pelvis was performed following the standard protocol without IV contrast. RADIATION DOSE REDUCTION: This exam was performed according to the departmental dose-optimization program which includes automated exposure control, adjustment of the mA and/or kV according to patient size and/or use of iterative reconstruction technique. COMPARISON:  None Available. FINDINGS: Lower chest: Basilar atelectatic changes are noted. Hepatobiliary: Liver is within normal limits. Gallbladder demonstrates a normal appearance. Pancreas: Unremarkable. No pancreatic ductal dilatation or surrounding inflammatory changes. Spleen: Normal in size without focal abnormality. Adrenals/Urinary Tract: Adrenal glands are within normal limits. Kidneys demonstrate tiny nonobstructing stones on right in the lower pole. No ureteral obstruction is seen. The bladder is decompressed. Stomach/Bowel: Considerable fecal material is noted within the rectal vault which may represent a focal impaction. Diverticular change of the colon is noted as well as some generalized wall thickening throughout the colon. The appendix is within normal limits. Small bowel is within normal limits. Stomach demonstrates  a defect in the anterior gastric wall consistent with a perforated ulcer. There is a considerable amount of air and fluid throughout the abdomen with apparent direct communication with the gastric best seen on image number 36 of series 2 and image number 78 of series 6. Vascular/Lymphatic: Aortic atherosclerosis. No enlarged abdominal or pelvic lymph nodes. Reproductive: Status post hysterectomy. No adnexal masses. Other: Free air and free fluid similar to that described above throughout the abdomen. The bowel demonstrates some reactive wall thickening related to the fluid and air. Musculoskeletal: Degenerative changes of the lumbar spine are seen. No other bony abnormality is noted. IMPRESSION: Changes consistent with a perforated gastric ulcer anteriorly with considerable spillage of fluid and air into the abdominal cavity. Some reactive wall thickening is noted within the bowel loops secondary to these changes. Critical Value/emergent results were called by telephone at the time of interpretation on 06/03/2022 at 10:43 pm to Dr. Aletta Edouard , who verbally acknowledged these results. Electronically Signed   By: Inez Catalina M.D.   On: 06/03/2022 22:45   DG Chest Port 1 View  Result Date: 06/03/2022 CLINICAL DATA:  Weakness. EXAM: PORTABLE CHEST 1 VIEW COMPARISON:  None Available. FINDINGS: Multiple overlying radiopaque cardiac lead wires are seen. The heart size and mediastinal contours are within normal limits. Mild, diffuse, chronic appearing increased lung markings are seen. There is no evidence of an acute infiltrate, pleural effusion or pneumothorax. Radiopaque surgical clips are seen along the lateral aspect of the left chest wall. A crescentic area of air is seen just below the right hemidiaphragm. Degenerative changes seen throughout the thoracic spine. IMPRESSION: 1. Chronic appearing increased lung markings without evidence of acute or active cardiopulmonary disease. 2. Air seen just below the right  hemidiaphragm which may represent an air filled loop of colon. Further evaluation with abdomen pelvis CT is recommended, as the presence of intra-abdominal free air cannot be excluded. Electronically Signed   By: Virgina Norfolk M.D.   On: 06/03/2022 21:22    I spent more than 10 minutes for this patient encounter including review of prior medical records, coordination of care  with primary/other specialist   Electronically signed by:   Rosiland Oz, MD Infectious Disease Physician [facility] for Infectious Disease Pager: [phone]

## 2022-06-08 NOTE — NC FL2 (Signed)
[place] LEVEL OF CARE SCREENING TOOL     IDENTIFICATION  Patient Name: Carolyn Erickson Birthdate: 1946/07/12 Sex: female Admission Date (Current Location): 06/03/2022  [facility] and [place] Number:  [employer] and Address:  [place] [address] [place]      Provider Number: [id number]  Attending Physician Name and Address:  Virl Cagey, MD  Relative Name and Phone Number:  Carolyn Erickson Daughter     [phone]    Current Level of Care: Hospital Recommended Level of Care: [place] Prior Approval Number:    Date Approved/Denied:   PASRR Number: [id number]  Discharge Plan: SNF    Current Diagnoses: Patient Active Problem List   Diagnosis Date Noted   Fungemia 06/06/2022   Gastric perforation ([place]) 06/04/2022   GERD (gastroesophageal reflux disease) 06/04/2022   Acute renal failure superimposed on stage 3a chronic kidney disease ([place]) 06/04/2022   Major neurocognitive disorder ([place]) 06/04/2022   Acute respiratory failure with hypoxia (HCC) 06/04/2022   Hypomagnesemia 06/04/2022   Acute gastric ulcer with perforation (HCC)    Septic shock (HCC)    Thyroid nodule 02/10/2021   Affective disorder ([place]) 11/26/2019   BMI 22.0-22.9, adult 11/26/2019   Hx of adenomatous polyp of colon 11/26/2019   Personal history of breast cancer 11/26/2019   DDD (degenerative disc disease), lumbar 06/20/2019   Depression, recurrent ([place]) 05/29/2019   Mixed hyperlipidemia 05/29/2019   Essential hypertension 05/29/2019   Cervical radiculopathy 01/11/2018    Orientation RESPIRATION BLADDER Height & Weight     Self, Place  O2 (see dc summary) Incontinent Weight: 149 lb 7.6 oz (67.8 kg) Height:  '5\' 1"'$  (154.9 cm)  BEHAVIORAL SYMPTOMS/MOOD NEUROLOGICAL BOWEL NUTRITION STATUS      Incontinent Diet (see dc summary)  AMBULATORY STATUS COMMUNICATION OF NEEDS Skin   Extensive Assist Verbally Normal                        Personal Care Assistance Level of Assistance  Bathing, Feeding, Dressing Bathing Assistance: Limited assistance Feeding assistance: Independent Dressing Assistance: Limited assistance     Functional Limitations Info  Sight, Hearing, Speech Sight Info: Adequate Hearing Info: Impaired Speech Info: Impaired    SPECIAL CARE FACTORS FREQUENCY  PT (By licensed PT), OT (By licensed OT)     PT Frequency: 5x week OT Frequency: 3x week            Contractures Contractures Info: Not present    Additional Factors Info  Code Status, Allergies Code Status Info: Full Allergies Info: NKA           Current Medications (06/08/2022):  This is the current hospital active medication list Current Facility-Administered Medications  Medication Dose Route Frequency Provider Last Rate Last Admin   Chlorhexidine Gluconate Cloth 2 % PADS 6 each  6 each Topical Daily Virl Cagey, MD   6 each at 06/08/22 1036   dextrose 5 % in lactated ringers infusion   Intravenous Continuous Wynetta Emery, Clanford L, MD 60 mL/hr at 06/08/22 0755 New Bag at 06/08/22 0755   diphenhydrAMINE (BENADRYL) 12.5 MG/5ML elixir 12.5 mg  12.5 mg Oral Q6H PRN Virl Cagey, MD       Or   diphenhydrAMINE (BENADRYL) injection 12.5 mg  12.5 mg Intravenous Q6H PRN Virl Cagey, MD   12.5 mg at 06/07/22 2200   heparin injection 5,000 Units  5,000 Units Subcutaneous Q8H Curlene Labrum  C, MD   5,000 Units at 06/08/22 1508   insulin aspart (novoLOG) injection 0-9 Units  0-9 Units Subcutaneous Q8H Virl Cagey, MD   1 Units at 06/06/22 0852   iohexol (OMNIPAQUE) 300 MG/ML solution 100 mL  100 mL Per Tube Once PRN Virl Cagey, MD       micafungin [facility]) 100 mg in sodium chloride 0.9 % 100 mL IVPB  100 mg Intravenous Q24H Vu, Trung T, MD 105 mL/hr at 06/08/22 1116 100 mg at 06/08/22 1116   morphine (PF) 2 MG/ML injection 2 mg  2 mg Intravenous Q2H PRN Virl Cagey, MD   2 mg at 06/07/22  2200   ondansetron (ZOFRAN-ODT) disintegrating tablet 4 mg  4 mg Oral Q6H PRN Virl Cagey, MD       Or   ondansetron [facility]) injection 4 mg  4 mg Intravenous Q6H PRN Virl Cagey, MD   4 mg at 06/06/22 1151   Oral care mouth rinse  15 mL Mouth Rinse PRN Virl Cagey, MD       Oral care mouth rinse  15 mL Mouth Rinse Q4H Shah, Pratik D, DO   15 mL at 06/08/22 1119   pantoprazole (PROTONIX) injection 40 mg  40 mg Intravenous Q12H Virl Cagey, MD   40 mg at 06/08/22 1035   piperacillin-tazobactam (ZOSYN) IVPB 3.375 g  3.375 g Intravenous Q12H Virl Cagey, MD 12.5 mL/hr at 06/08/22 0602 3.375 g at 06/08/22 0602     Discharge Medications: Please see discharge summary for a list of discharge medications.  Relevant Imaging Results:  Relevant Lab Results:   Additional Information SSN [id number] [place]

## 2022-06-08 NOTE — Progress Notes (Signed)
Pt's glucose 76 around midnight. Recheck done around 2AM. BS 70 currently. Order for D50 placed. Will recheck glucose and administer if below 70 per policy.

## 2022-06-08 NOTE — TOC Initial Note (Signed)
Transition of Care [facility]) - Initial/Assessment Note    Patient Details  Name: Carolyn Erickson MRN: [id number] Date of Birth: [birth year]  Transition of Care [facility]) CM/SW Contact:    Shade Flood, LCSW Phone Number: 06/08/2022, 3:14 PM  Clinical Narrative:                  Pt admitted from home. Per MD, pt will need IV anbx at dc and PT recommending SNF rehab. Met with pt at bedside today to review dc planning. Pt asked TOC to contact her family. Attempted to reach pt's husband though unable to connect. Spoke with pt's son to review needs for dc. Son states family agreeable to SNF referrals. CMS provider options reviewed and will refer as requested.  Per MD, pt will require hospitalization through the weekend. TOC will start insurance auth request when pt more medically stable closer to dc.  Will follow.  Expected Discharge Plan: Skilled Nursing Facility Barriers to Discharge: Continued Medical Work up   Patient Goals and CMS Choice Patient states their goals for this hospitalization and ongoing recovery are:: rehab CMS Medicare.gov Compare Post Acute Care list provided to:: Patient Represenative (must comment) Choice offered to / list presented to : Adult Children  Expected Discharge Plan and Services Expected Discharge Plan: [place] In-house Referral: Clinical Social Work   Post Acute Care Choice: [place] Living arrangements for the past 2 months: [place]                                      Prior Living Arrangements/Services Living arrangements for the past 2 months: Single Family Home Lives with:: Spouse Patient language and need for interpreter reviewed:: Yes Do you feel safe going back to the place where you live?: Yes      Need for Family Participation in Patient Care: Yes (Comment) Care giver support system in place?: Yes (comment)   Criminal Activity/Legal Involvement Pertinent to Current  Situation/Hospitalization: No - Comment as needed  Activities of Daily Living Home Assistive Devices/Equipment: Other (Comment)    Permission Sought/Granted Permission sought to share information with : Facility Art therapist granted to share information with : Yes, Verbal Permission Granted     Permission granted to share info w AGENCY: snfs        Emotional Assessment Appearance:: Appears stated age Attitude/Demeanor/Rapport: Engaged Affect (typically observed): Pleasant Orientation: : Oriented to Self, Oriented to Place, Oriented to  Time, Oriented to Situation Alcohol / Substance Use: Not Applicable Psych Involvement: No (comment)  Admission diagnosis:  Shock ([place]) [R57.9] Acute gastric ulcer with perforation ([place]) [K25.1] Gastric perforation ([place]) [K25.5] Patient Active Problem List   Diagnosis Date Noted   Fungemia 06/06/2022   Gastric perforation ([place]) 06/04/2022   GERD (gastroesophageal reflux disease) 06/04/2022   Acute renal failure superimposed on stage 3a chronic kidney disease ([place]) 06/04/2022   Major neurocognitive disorder ([place]) 06/04/2022   Acute respiratory failure with hypoxia ([place]) 06/04/2022   Hypomagnesemia 06/04/2022   Acute gastric ulcer with perforation ([place])    Septic shock ([place])    Thyroid nodule 02/10/2021   Affective disorder ([place]) 11/26/2019   BMI 22.0-22.9, adult 11/26/2019   Hx of adenomatous polyp of colon 11/26/2019   Personal history of breast cancer 11/26/2019   DDD (degenerative disc disease), lumbar 06/20/2019   Depression, recurrent ([place]) 05/29/2019   Mixed hyperlipidemia 05/29/2019  Essential hypertension 05/29/2019   Cervical radiculopathy 01/11/2018   PCP:  Dettinger, Fransisca Kaufmann, MD Pharmacy:   [facility] [address] [place] [address] [place] [address] Phone: [phone] Fax: [phone]  [facility] [place], [place] [address] Phone: [phone] Fax: [phone]     Social Determinants of Health (SDOH) Interventions    Readmission Risk Interventions     No data to display

## 2022-06-08 NOTE — Progress Notes (Addendum)
I was present with the medical student for this service. I personally verified the history of present illness, performed the physical exam, and made the plan for this encounter. I have verified the medical student's documentation and made modifications where appropriately. I have personally documented in my own words a brief history, physical, and plan below.     Wakes up and follows commands. UGI today.  PRN for pain IS, OOB PT ordered Holding on TEE due to the gastric perf and the gastric component of TEE. ID was ok with this yesterday. IV antibiotics antifungals per ID.Positive cultures from the ED PRIOR to any CVL or arterial line.  Foley out  Updated son.   Curlene Labrum, MD [facility] [place], [place] [address] [phone] (office)    [facility] Progress Note  4 Days Post-Op  Subjective: Patient is not able to converse much due to fatigue, but is awake and appears to be improving. RN reported patient stated she was experiencing some throat soreness, potentially from NG tube adjustment this morning as NG tube was slipping. Otherwise, no acute concerns from overnight.  Objective: Vital signs in last 24 hours: Temp:  [97.6 F (36.4 C)-99.4 F (37.4 C)] 99.4 F (37.4 C) (07/06 0700) Pulse Rate:  [29-112] 101 (07/06 0900) Resp:  [15-31] 17 (07/06 0900) BP: (102-141)/(51-69) 111/58 (07/06 0900) SpO2:  [91 %-99 %] 94 % (07/06 0900) Weight:  [67.8 kg] 67.8 kg (07/06 0700) Last BM Date :  (None during admission)  Intake/Output from previous day: 07/05 0701 - 07/06 0700 In: 1569.1 [I.V.:1474.4; IV Piggyback:94.7] Out: 600 [Urine:600] Intake/Output this shift: No intake/output data recorded.  General appearance: alert, cooperative, and fatigued. Resp: CTAB, patient is breathing comfortably on 2L [place] Cardio: Tachycardic, no murmurs Skin: warm, dry Incision/Wound: Clean, dry, no bleeding  Lab Results:   Recent Labs    06/07/22 0412 06/08/22 0344  WBC 25.2* 23.0*  HGB 11.8* 11.7*  HCT 37.6 37.9  PLT 121* 132*   BMET Recent Labs    06/07/22 0412 06/08/22 0344  NA 137 140  K 4.4 4.7  CL 108 111  CO2 24 23  GLUCOSE 125* 72  BUN 43* 47*  CREATININE 1.81* 1.82*  CALCIUM 8.1* 8.4*   PT/INR No results for input(s): "LABPROT", "INR" in the last 72 hours.  Studies/Results: ECHOCARDIOGRAM COMPLETE  Result Date: 06/07/2022    ECHOCARDIOGRAM REPORT   Patient Name:   Carolyn Erickson Date of Exam: 06/07/2022 Medical Rec #:  [id number]       Height:       61.0 in Accession #:    [id number]      Weight:       112.2 lb Date of Birth:  21-Apr-1946        BSA:          1.477 m Patient Age:    76 years        BP:           116/46 mmHg Patient Gender: F               HR:           104 bpm. Exam Location:  [facility] Procedure: 2D Echo, Cardiac Doppler and Color Doppler Indications:    Bacteremia  History:        Patient has no prior history of Echocardiogram examinations.  Signs/Symptoms:Bacteremia; Risk Factors:Hypertension and                 Dyslipidemia. Breast CA.  Sonographer:    Wenda Low Referring Phys: [id number] [place]  1. Abnormal septal motion . Left ventricular ejection fraction, by estimation, is 55 to 60%. The left ventricle has normal function. The left ventricle has no regional wall motion abnormalities. Left ventricular diastolic parameters were normal.  2. Right ventricular systolic function is normal. The right ventricular size is normal. There is mildly elevated pulmonary artery systolic pressure.  3. Moderate pleural effusion in the left lateral region.  4. The mitral valve is abnormal. Trivial mitral valve regurgitation. No evidence of mitral stenosis.  5. Thickening and calcification noted . The tricuspid valve is abnormal.  6. Some thickening in the LVOT near intervalvular fibrosa Given this and nodular calcification of PV suggest TEE if suspicion for  SBE high. The aortic valve is tricuspid. There is moderate calcification of the aortic valve. There is moderate thickening of the aortic valve. Aortic valve regurgitation is mild. Aortic valve sclerosis/calcification is present, without any evidence of aortic stenosis.  7. Nodular calcification seen on PV. The pulmonic valve was abnormal.  8. The inferior vena cava is normal in size with greater than 50% respiratory variability, suggesting right atrial pressure of 3 mmHg. FINDINGS  Left Ventricle: Abnormal septal motion. Left ventricular ejection fraction, by estimation, is 55 to 60%. The left ventricle has normal function. The left ventricle has no regional wall motion abnormalities. The left ventricular internal cavity size was normal in size. There is no left ventricular hypertrophy. Left ventricular diastolic parameters were normal. Right Ventricle: The right ventricular size is normal. No increase in right ventricular wall thickness. Right ventricular systolic function is normal. There is mildly elevated pulmonary artery systolic pressure. The tricuspid regurgitant velocity is 3.21  m/s, and with an assumed right atrial pressure of 3 mmHg, the estimated right ventricular systolic pressure is 22.0 mmHg. Left Atrium: Left atrial size was normal in size. Right Atrium: Right atrial size was normal in size. Pericardium: There is no evidence of pericardial effusion. Mitral Valve: The mitral valve is abnormal. There is mild thickening of the mitral valve leaflet(s). There is mild calcification of the mitral valve leaflet(s). Mild mitral annular calcification. Trivial mitral valve regurgitation. No evidence of mitral valve stenosis. MV peak gradient, 3.9 mmHg. The mean mitral valve gradient is 1.0 mmHg. Tricuspid Valve: Thickening and calcification noted. The tricuspid valve is abnormal. Tricuspid valve regurgitation is trivial. No evidence of tricuspid stenosis. Aortic Valve: Some thickening in the LVOT near  intervalvular fibrosa Given this and nodular calcification of PV suggest TEE if suspicion for SBE high. The aortic valve is tricuspid. There is moderate calcification of the aortic valve. There is moderate thickening of the aortic valve. Aortic valve regurgitation is mild. Aortic valve sclerosis/calcification is present, without any evidence of aortic stenosis. Aortic valve mean gradient measures 5.0 mmHg. Aortic valve peak gradient measures 10.3 mmHg. Aortic valve area, by VTI measures 2.02 cm. Pulmonic Valve: Nodular calcification seen on PV. The pulmonic valve was abnormal. Pulmonic valve regurgitation is trivial. No evidence of pulmonic stenosis. Aorta: The aortic root is normal in size and structure. Venous: The inferior vena cava is normal in size with greater than 50% respiratory variability, suggesting right atrial pressure of 3 mmHg. IAS/Shunts: No atrial level shunt detected by color flow Doppler. Additional Comments: There is a moderate pleural effusion in the left lateral  region.  LEFT VENTRICLE PLAX 2D LVIDd:         4.50 cm     Diastology LVIDs:         3.00 cm     LV e' medial:    7.29 cm/s LV PW:         0.80 cm     LV E/e' medial:  11.8 LV IVS:        0.80 cm     LV e' lateral:   9.79 cm/s LVOT diam:     1.80 cm     LV E/e' lateral: 8.8 LV SV:         55 LV SV Index:   37 LVOT Area:     2.54 cm  LV Volumes (MOD) LV vol d, MOD A2C: 43.7 ml LV vol d, MOD A4C: 52.7 ml LV vol s, MOD A2C: 17.3 ml LV vol s, MOD A4C: 23.8 ml LV SV MOD A2C:     26.4 ml LV SV MOD A4C:     52.7 ml LV SV MOD BP:      28.6 ml RIGHT VENTRICLE RV Basal diam:  3.10 cm RV Mid diam:    2.90 cm RV S prime:     14.50 cm/s TAPSE (M-mode): 3.0 cm LEFT ATRIUM             Index        RIGHT ATRIUM           Index LA diam:        3.60 cm 2.44 cm/m   RA Area:     11.50 cm LA Vol (A2C):   50.9 ml 34.45 ml/m  RA Volume:   21.80 ml  14.76 ml/m LA Vol (A4C):   43.7 ml 29.58 ml/m LA Biplane Vol: 49.8 ml 33.71 ml/m  AORTIC VALVE                      PULMONIC VALVE AV Area (Vmax):    1.92 cm      PV Vmax:       0.98 m/s AV Area (Vmean):   1.84 cm      PV Peak grad:  3.9 mmHg AV Area (VTI):     2.02 cm AV Vmax:           160.50 cm/s AV Vmean:          103.550 cm/s AV VTI:            0.274 m AV Peak Grad:      10.3 mmHg AV Mean Grad:      5.0 mmHg LVOT Vmax:         121.00 cm/s LVOT Vmean:        74.800 cm/s LVOT VTI:          0.218 m LVOT/AV VTI ratio: 0.79  AORTA Ao Root diam: 3.30 cm MITRAL VALVE               TRICUSPID VALVE MV Area (PHT): 6.02 cm    TR Peak grad:   41.2 mmHg MV Area VTI:   2.25 cm    TR Vmax:        321.00 cm/s MV Peak grad:  3.9 mmHg MV Mean grad:  1.0 mmHg    SHUNTS MV Vmax:       0.99 m/s    Systemic VTI:  0.22 m MV Vmean:      48.8 cm/s   Systemic Diam:  1.80 cm MV Decel Time: 126 msec MV E velocity: 86.00 cm/s MV A velocity: 72.70 cm/s MV E/A ratio:  1.18 Jenkins Rouge MD Electronically signed by Jenkins Rouge MD Signature Date/Time: 06/07/2022/10:28:36 AM    Final     Anti-infectives: Anti-infectives (From admission, onward)    Start     Dose/Rate Route Frequency Ordered Stop   06/07/22 0200  fluconazole (DIFLUCAN) IVPB 200 mg  Status:  Discontinued        200 mg 100 mL/hr over 60 Minutes Intravenous Every 24 hours 06/06/22 0901 06/06/22 1031   06/06/22 1200  micafungin (MYCAMINE) 100 mg in sodium chloride 0.9 % 100 mL IVPB        100 mg 105 mL/hr over 1 Hours Intravenous Every 24 hours 06/06/22 1031     06/05/22 1800  piperacillin-tazobactam (ZOSYN) IVPB 3.375 g        3.375 g 12.5 mL/hr over 240 Minutes Intravenous Every 12 hours 06/05/22 0825 06/08/22 2359   06/04/22 0600  piperacillin-tazobactam (ZOSYN) IVPB 3.375 g  Status:  Discontinued        3.375 g 12.5 mL/hr over 240 Minutes Intravenous Every 8 hours 06/04/22 0219 06/05/22 0825   06/04/22 0315  fluconazole (DIFLUCAN) IVPB 200 mg  Status:  Discontinued        200 mg 100 mL/hr over 60 Minutes Intravenous Every 24 hours 06/04/22 0217 06/04/22 0219    06/04/22 0315  fluconazole (DIFLUCAN) IVPB 200 mg  Status:  Discontinued        200 mg 100 mL/hr over 60 Minutes Intravenous Every 24 hours 06/04/22 0219 06/06/22 0901   06/03/22 2130  ceFEPIme (MAXIPIME) 2 g in sodium chloride 0.9 % 100 mL IVPB        2 g 200 mL/hr over 30 Minutes Intravenous  Once 06/03/22 2127 06/03/22 2209   06/03/22 2130  metroNIDAZOLE (FLAGYL) IVPB 500 mg  Status:  Discontinued        500 mg 100 mL/hr over 60 Minutes Intravenous Every 12 hours 06/03/22 2127 06/04/22 0217       Assessment/Plan: s/p Procedure(s): POD 4; EXPLORATORY LAPAROTOMY with omental patch  Patient is a 76 y.o. F who underwent an EXPLORATORY LAPAROTOMY with omental patch and presented with septic shock.  - PRN for pain - Patient is breathing comfortably on 2L [place] - NG tube was re-adjusted this morning by RN due to some reported displacement. No complications with adjustment. Patient receiving UGI study today - if study reveals repair is stable, can D/C NG tube. Continue PPI BID. - Continue line holiday and can reassess if patient needs more TPN or ID team requires prolonged IV treatment - Continue SCDs and heparin sq - Labs in am    LOS: 4 days    Pirapat Rerk[place], Medical Student 06/08/2022

## 2022-06-08 NOTE — Progress Notes (Signed)
[facility]  UGI without leak. Dc Ng and start clears.  Curlene Labrum, MD [facility] [address] [place], [place] [address] [phone] (office)

## 2022-06-09 ENCOUNTER — Inpatient Hospital Stay (HOSPITAL_COMMUNITY): Payer: Medicare PPO

## 2022-06-09 DIAGNOSIS — K255 Chronic or unspecified gastric ulcer with perforation: Secondary | ICD-10-CM | POA: Diagnosis not present

## 2022-06-09 DIAGNOSIS — A419 Sepsis, unspecified organism: Secondary | ICD-10-CM | POA: Diagnosis not present

## 2022-06-09 DIAGNOSIS — B49 Unspecified mycosis: Secondary | ICD-10-CM | POA: Diagnosis not present

## 2022-06-09 DIAGNOSIS — N179 Acute kidney failure, unspecified: Secondary | ICD-10-CM | POA: Diagnosis not present

## 2022-06-09 DIAGNOSIS — E8770 Fluid overload, unspecified: Secondary | ICD-10-CM | POA: Diagnosis not present

## 2022-06-09 LAB — COMPREHENSIVE METABOLIC PANEL
ALT: 14 U/L (ref 0–44)
AST: 19 U/L (ref 15–41)
Albumin: <1.5 g/dL — ABNORMAL LOW (ref 3.5–5.0)
Alkaline Phosphatase: 90 U/L (ref 38–126)
Anion gap: 6 (ref 5–15)
BUN: 54 mg/dL — ABNORMAL HIGH (ref 8–23)
CO2: 26 mmol/L (ref 22–32)
Calcium: 8.9 mg/dL (ref 8.9–10.3)
Chloride: 112 mmol/L — ABNORMAL HIGH (ref 98–111)
Creatinine, Ser: 1.88 mg/dL — ABNORMAL HIGH (ref 0.44–1.00)
GFR, Estimated: 27 mL/min — ABNORMAL LOW (ref >60)
Glucose, Bld: 140 mg/dL — ABNORMAL HIGH (ref 70–99)
Potassium: 4.1 mmol/L (ref 3.5–5.1)
Sodium: 144 mmol/L (ref 135–145)
Total Bilirubin: 3.3 mg/dL — ABNORMAL HIGH (ref 0.3–1.2)
Total Protein: 4.6 g/dL — ABNORMAL LOW (ref 6.5–8.1)

## 2022-06-09 LAB — CBC WITH DIFFERENTIAL/PLATELET
Abs Immature Granulocytes: 0.97 10*3/uL — ABNORMAL HIGH (ref 0.00–0.07)
Basophils Absolute: 0.2 10*3/uL — ABNORMAL HIGH (ref 0.0–0.1)
Basophils Relative: 1 %
Eosinophils Absolute: 0.4 10*3/uL (ref 0.0–0.5)
Eosinophils Relative: 2 %
HCT: 38.3 % (ref 36.0–46.0)
Hemoglobin: 12 g/dL (ref 12.0–15.0)
Immature Granulocytes: 4 %
Lymphocytes Relative: 8 %
Lymphs Abs: 1.9 10*3/uL (ref 0.7–4.0)
MCH: 29.4 pg (ref 26.0–34.0)
MCHC: 31.3 g/dL (ref 30.0–36.0)
MCV: 93.9 fL (ref 80.0–100.0)
Monocytes Absolute: 1.5 10*3/uL — ABNORMAL HIGH (ref 0.1–1.0)
Monocytes Relative: 7 %
Neutro Abs: 18.6 10*3/uL — ABNORMAL HIGH (ref 1.7–7.7)
Neutrophils Relative %: 78 %
Platelets: 122 10*3/uL — ABNORMAL LOW (ref 150–400)
RBC: 4.08 MIL/uL (ref 3.87–5.11)
RDW: 13.9 % (ref 11.5–15.5)
WBC: 23.5 10*3/uL — ABNORMAL HIGH (ref 4.0–10.5)
nRBC: 0.1 % (ref 0.0–0.2)

## 2022-06-09 LAB — GLUCOSE, CAPILLARY
Glucose-Capillary: 155 mg/dL — ABNORMAL HIGH (ref 70–99)
Glucose-Capillary: 144 mg/dL — ABNORMAL HIGH (ref 70–99)
Glucose-Capillary: 125 mg/dL — ABNORMAL HIGH (ref 70–99)
Glucose-Capillary: 158 mg/dL — ABNORMAL HIGH (ref 70–99)
Glucose-Capillary: 154 mg/dL — ABNORMAL HIGH (ref 70–99)
Glucose-Capillary: 106 mg/dL — ABNORMAL HIGH (ref 70–99)

## 2022-06-09 LAB — CULTURE, BLOOD (ROUTINE X 2): Special Requests: ADEQUATE

## 2022-06-09 LAB — HEPATIC FUNCTION PANEL
ALT: 14 U/L (ref 0–44)
AST: 21 U/L (ref 15–41)
Albumin: <1.5 g/dL — ABNORMAL LOW (ref 3.5–5.0)
Alkaline Phosphatase: 89 U/L (ref 38–126)
Bilirubin, Direct: 2.3 mg/dL — ABNORMAL HIGH (ref 0.0–0.2)
Indirect Bilirubin: 0.9 mg/dL (ref 0.3–0.9)
Total Bilirubin: 3.2 mg/dL — ABNORMAL HIGH (ref 0.3–1.2)
Total Protein: 4.7 g/dL — ABNORMAL LOW (ref 6.5–8.1)

## 2022-06-09 LAB — AEROBIC/ANAEROBIC CULTURE W GRAM STAIN (SURGICAL/DEEP WOUND)

## 2022-06-09 LAB — MAGNESIUM: Magnesium: 2.3 mg/dL (ref 1.7–2.4)

## 2022-06-09 LAB — CATH TIP CULTURE: Culture: NO GROWTH

## 2022-06-09 MED ORDER — POTASSIUM CHLORIDE 20 MEQ PO PACK
40.0000 meq | PACK | Freq: Once | ORAL | Status: AC
  Administered 2022-06-09: 40 meq via ORAL
  Filled 2022-06-09: qty 2

## 2022-06-09 MED ORDER — FUROSEMIDE 10 MG/ML IJ SOLN
40.0000 mg | Freq: Two times a day (BID) | INTRAMUSCULAR | Status: AC
  Administered 2022-06-09 – 2022-06-10 (×2): 40 mg via INTRAVENOUS
  Filled 2022-06-09 (×2): qty 4

## 2022-06-09 MED ORDER — AMLODIPINE BESYLATE 5 MG PO TABS
2.5000 mg | ORAL_TABLET | Freq: Every day | ORAL | Status: DC
  Administered 2022-06-10 – 2022-06-24 (×14): 2.5 mg via ORAL
  Filled 2022-06-09 (×15): qty 1

## 2022-06-09 NOTE — Progress Notes (Signed)
Patient c/o SOB, increase in respirations and exp wheezing noted on reassessment.  MD notified of change in patient condition.  Orders received.

## 2022-06-09 NOTE — Progress Notes (Signed)
[facility] Infectious Diseases Follow Up Note  Patient Identification: Patient Name: Carolyn Erickson MRN: [id number] [employer] Date: 06/03/2022  8:08 PM Age: 76 y.o.Today's Date: 06/09/2022  Reason for Visit: fungemia and gastric perforation   Principal Problem:   Septic shock ([place]) Active Problems:   Mixed hyperlipidemia   Essential hypertension   Gastric perforation (HCC)   GERD (gastroesophageal reflux disease)   Acute renal failure superimposed on stage 3a chronic kidney disease (HCC)   Major neurocognitive disorder ([place])   Acute respiratory failure with hypoxia ([place])   Hypomagnesemia   Fungemia  Antibiotics:  Cefepime 7/1, zosyn 7/1-c  Metronidazole 7/1 Fluconazole 7/1-7/3, Micafungin 7/4-c  Lines/Hardwares: RT abdominal drain  Interval Events: afebrile, WBC 25>23. Poor TEE candidate. JP drain with dark bloody output- old blood vs small leak. D. Bili slightly up    Assessment Candida glabrata fungemia 2/2 below  - repeat blood cx 7/4 NGTD - TTE 7/6 with thickening in the LVOT,  nodular calcification of PV, moderate calcification of AV, moderate thickening of A, Aortic sclerosis/calcification. No prior TTE to compare  - Poor TEE candidate 2/2 below  Intrabadominal abscess 2/2 gastric perforation/Peritonitis - s/p Exploratory laparotomy, gastrorrhaphy, omental patch, central line placement (left IJ). CVC appears to have been removed 7/4. Cx with candida albicans  - UGI without leak and plan for DC NGT and starting CLD  3. AKI on CKD- Cr stable  4. Thrombocytopenia 5. Acute hypoxic respiratory failure: down to 2L [place] 6. On TPN  7. Hyperbilirubinemia/Hypoalbuminemia  Recommendations Continue Micafungin as is pending sensi of C glabrata  Pip/tazo stopped from today after completing 5 days course from source control  Fu blood cultures and OR cultures  Hold off on PICC line until blood cultures 7/4 negative (  finalized) If candida glabrata S to Fluconazole, we may be able to switch micafungin to high dose fluconazole Monitor CBC and CMP   Dr Gale Journey covering this weekend.   Rest of the management as per the primary team. Thank you for the consult. Please page with pertinent questions or concerns.  ______________________________________________________________________ Subjective Patient not seen, chart reviewed.  Past Medical History:  Diagnosis Date   Cancer [facility]) 2001   breast left   Depression    Hypertension    Past Surgical History:  Procedure Laterality Date   ABDOMINAL HYSTERECTOMY     BACK SURGERY     LAPAROTOMY N/A 06/04/2022   Procedure: EXPLORATORY LAPAROTOMY, omental patch;  Surgeon: Virl Cagey, MD;  Location: [facility];  Service: General;  Laterality: N/A;    Vitals BP (!) 146/79 (BP Location: Left Arm)   Pulse 98   Temp 98.6 F (37 C) (Oral)   Resp 17   Ht '5\' 1"'$  (1.549 m)   Wt 67.8 kg   SpO2 90% Comment: recheck  BMI 28.24 kg/m   Pertinent Microbiology Results for orders placed or performed during the hospital encounter of 06/03/22  Cath Tip Culture     Status: None   Collection Time: 06/03/22  8:07 PM   Specimen: Catheter Tip  Result Value Ref Range Status   Specimen Description CATH TIP  Final   Special Requests NONE  Final   Culture   Final    NO GROWTH 3 DAYS Performed at [facility], [place] [address] [place], [place] [address]    Report Status 06/09/2022 FINAL  Final  Culture, blood (Routine x 2)     Status: Abnormal   Collection Time: 06/03/22  8:45  PM   Specimen: BLOOD  Result Value Ref Range Status   Specimen Description   Final    BLOOD LEFT ANTECUBITAL Performed at [facility], [place] [address], [place], [place] [address]    Special Requests   Final    BOTTLES DRAWN AEROBIC AND ANAEROBIC Blood Culture adequate volume Performed at [facility], [address], [place], [place] [address]    Culture  Setup Time   Final    YEAST  BOTH Gram Stain Report Called to,Read Back By and Verified With: JACOBS, U'@0745'$  BY MATTHEWS, B 7.4.2023 CRITICAL RESULT CALLED TO, READ BACK BY AND VERIFIED WITH: PHARMD S.HURTZ AT 1130 ON 06/06/2022 BY T.SAAD.    Culture (A)  Final    CANDIDA GLABRATA Sent to [facility] for further susceptibility testing. Performed at [facility], [place] [address] [place], [place] [address]    Report Status 06/09/2022 FINAL  Final  Blood Culture ID Panel (Reflexed)     Status: Abnormal   Collection Time: 06/03/22  8:45 PM  Result Value Ref Range Status   Enterococcus faecalis NOT DETECTED NOT DETECTED Final   Enterococcus Faecium NOT DETECTED NOT DETECTED Final   Listeria monocytogenes NOT DETECTED NOT DETECTED Final   Staphylococcus species NOT DETECTED NOT DETECTED Final   Staphylococcus aureus (BCID) NOT DETECTED NOT DETECTED Final   Staphylococcus epidermidis NOT DETECTED NOT DETECTED Final   Staphylococcus lugdunensis NOT DETECTED NOT DETECTED Final   Streptococcus species NOT DETECTED NOT DETECTED Final   Streptococcus agalactiae NOT DETECTED NOT DETECTED Final   Streptococcus pneumoniae NOT DETECTED NOT DETECTED Final   Streptococcus pyogenes NOT DETECTED NOT DETECTED Final   A.calcoaceticus-baumannii NOT DETECTED NOT DETECTED Final   Bacteroides fragilis NOT DETECTED NOT DETECTED Final   Enterobacterales NOT DETECTED NOT DETECTED Final   Enterobacter cloacae complex NOT DETECTED NOT DETECTED Final   Escherichia coli NOT DETECTED NOT DETECTED Final   Klebsiella aerogenes NOT DETECTED NOT DETECTED Final   Klebsiella oxytoca NOT DETECTED NOT DETECTED Final   Klebsiella pneumoniae NOT DETECTED NOT DETECTED Final   Proteus species NOT DETECTED NOT DETECTED Final   Salmonella species NOT DETECTED NOT DETECTED Final   Serratia marcescens NOT DETECTED NOT DETECTED Final   Haemophilus influenzae NOT DETECTED NOT DETECTED Final   Neisseria meningitidis NOT DETECTED NOT DETECTED Final    Pseudomonas aeruginosa NOT DETECTED NOT DETECTED Final   Stenotrophomonas maltophilia NOT DETECTED NOT DETECTED Final   Candida albicans NOT DETECTED NOT DETECTED Final   Candida auris NOT DETECTED NOT DETECTED Final   Candida glabrata DETECTED (A) NOT DETECTED Final    Comment: CRITICAL RESULT CALLED TO, READ BACK BY AND VERIFIED WITH: PHARMD S.HURTZ AT 1130 ON 06/06/2022 BY T.SAAD.    Candida krusei NOT DETECTED NOT DETECTED Final   Candida parapsilosis NOT DETECTED NOT DETECTED Final   Candida tropicalis NOT DETECTED NOT DETECTED Final   Cryptococcus neoformans/gattii NOT DETECTED NOT DETECTED Final    Comment: Performed at [facility], [place] [address] [place], [place] [address]  Antifungal AST 9 Drug Panel     Status: None (Preliminary result)   Collection Time: 06/03/22  8:45 PM  Result Value Ref Range Status   Organism ID, Yeast Preliminary report  Final    Comment: (NOTE) Specimen has been received and testing has been initiated. Performed At: [facility] [place], [place] [id number] Rush Farmer MD IO:[id number]    Amphotericin B MIC PENDING  Incomplete  Anidulafungin MIC PENDING  Incomplete   Caspofungin MIC PENDING  Incomplete   Micafungin MIC PENDING  Incomplete   Posaconazole MIC PENDING  Incomplete   Fluconazole Islt MIC PENDING  Incomplete   Flucytosine MIC PENDING  Incomplete   Itraconazole MIC PENDING  Incomplete   Voriconazole MIC PENDING  Incomplete   Source [id number] SRC BLD SENSI C GLABRATA  Final    Comment: Performed at [facility], [place] [address], [place], [place] [address]  Culture, blood (Routine x 2)     Status: Abnormal (Preliminary result)   Collection Time: 06/03/22  9:32 PM   Specimen: BLOOD  Result Value Ref Range Status   Specimen Description   Final    BLOOD LEFT ANTECUBITAL Performed at [facility], [place] [address], [place], [place] [address]    Special Requests   Final    BOTTLES DRAWN AEROBIC AND  ANAEROBIC Blood Culture adequate volume Performed at [facility], [address], [place], [place] [address]    Culture  Setup Time   Final    AEROBIC BOTTLE ONLY YEAST Gram Stain Report Called to,Read Back By and Verified With: UTE JACOBS @ 1200 ON 06/06/22 C VARNER CRITICAL VALUE NOTED.  VALUE IS CONSISTENT WITH PREVIOUSLY REPORTED AND CALLED VALUE. Performed at [facility], [place] [address], [place], [place] [address]    Culture CANDIDA GLABRATA (A)  Final   Report Status PENDING  Incomplete  MRSA Next Gen by PCR, Nasal     Status: None   Collection Time: 06/04/22  2:18 AM   Specimen: Nasal Mucosa; Nasal Swab  Result Value Ref Range Status   MRSA by PCR Next Gen NOT DETECTED NOT DETECTED Final    Comment: (NOTE) The GeneXpert MRSA Assay (FDA approved for NASAL specimens only), is one component of a comprehensive MRSA colonization surveillance program. It is not intended to diagnose MRSA infection nor to guide or monitor treatment for MRSA infections. Test performance is not FDA approved in patients less than 79 years old. Performed at [facility], [address], [place], [place] [address]   Urine Culture     Status: Abnormal   Collection Time: 06/04/22  2:28 AM   Specimen: PATH Cytology Urine  Result Value Ref Range Status   Specimen Description   Final    URINE, CLEAN CATCH Performed at [facility], [address], [place], [place] [address]    Special Requests   Final    NONE Performed at [facility], [address], [place], [place] [address]    Culture (A)  Final    <10,000 COLONIES/mL INSIGNIFICANT GROWTH Performed at [place] [address], [place], [place] [address]    Report Status 06/05/2022 FINAL  Final  Aerobic/Anaerobic Culture w Gram Stain (surgical/deep wound)     Status: None   Collection Time: 06/04/22  3:03 AM   Specimen: PATH Cytology FNA; Body Fluid  Result Value Ref Range Status   Specimen Description   Final    ANAL Performed at  [facility], [address] [place], [place] [address]    Special Requests   Final    NONE Performed at [facility], [address], [place],  [address]    Gram Stain   Final    MODERATE WBC PRESENT,BOTH PMN AND MONONUCLEAR FEW GRAM POSITIVE COCCI IN PAIRS MODERATE GRAM POSITIVE RODS    Culture   Final    FEW CANDIDA ALBICANS NO ANAEROBES ISOLATED  Performed at [facility], [place] [address] [place], [place] [address]    Report Status 06/09/2022 FINAL  Final  Culture, blood (Routine X 2) w Reflex to ID Panel     Status: None (Preliminary result)   Collection Time: 06/06/22  9:32 AM   Specimen: BLOOD LEFT WRIST  Result Value Ref Range Status   Specimen Description BLOOD LEFT WRIST  Final   Special Requests   Final    BOTTLES DRAWN AEROBIC ONLY Blood Culture results may not be optimal due to an inadequate volume of blood received in culture bottles   Culture   Final    NO GROWTH 3 DAYS Performed at [facility], [address] [place], [place] [address]    Report Status PENDING  Incomplete  Culture, blood (Routine X 2) w Reflex to ID Panel     Status: None (Preliminary result)   Collection Time: 06/06/22  9:33 AM   Specimen: Left Antecubital; Blood  Result Value Ref Range Status   Specimen Description LEFT ANTECUBITAL  Final   Special Requests   Final    BOTTLES DRAWN AEROBIC AND ANAEROBIC Blood Culture adequate volume   Culture   Final    NO GROWTH 3 DAYS Performed at [facility], [address] [place], [place] [address]    Report Status PENDING  Incomplete     Pertinent Lab.    Latest Ref Rng & Units 06/09/2022    5:03 AM 06/08/2022    3:44 AM 06/07/2022    4:12 AM  CBC  WBC 4.0 - 10.5 K/uL 23.5  23.0  25.2   Hemoglobin 12.0 - 15.0 g/dL 12.0  11.7  11.8   Hematocrit 36.0 - 46.0 % 38.3  37.9  37.6   Platelets 150 - 400 K/uL 122  132  121       Latest Ref Rng & Units 06/09/2022    9:05 AM 06/09/2022    5:03 AM 06/08/2022    3:44 AM  CMP  Glucose 70 - 99  mg/dL  140  72   BUN 8 - 23 mg/dL  54  47   Creatinine 0.44 - 1.00 mg/dL  1.88  1.82   Sodium 135 - 145 mmol/L  144  140   Potassium 3.5 - 5.1 mmol/L  4.1  4.7   Chloride 98 - 111 mmol/L  112  111   CO2 22 - 32 mmol/L  26  23   Calcium 8.9 - 10.3 mg/dL  8.9  8.4   Total Protein 6.5 - 8.1 g/dL 4.7  4.6  4.4   Total Bilirubin 0.3 - 1.2 mg/dL 3.2  3.3  2.7   Alkaline Phos 38 - 126 U/L 89  90  79   AST 15 - 41 U/L '21  19  16   '$ ALT 0 - 44 U/L '14  14  13      '$ Pertinent Imaging today Plain films and CT images have been personally visualized and interpreted; radiology reports have been reviewed. Decision making incorporated into the Impression / Recommendations.  DG CHEST PORT 1 VIEW  Result Date: 06/09/2022 CLINICAL DATA:  Weakness, wheezing EXAM: PORTABLE CHEST 1 VIEW COMPARISON:  06/05/2022 FINDINGS: Interval endotracheal and esophagogastric extubation. Small to moderate bilateral pleural effusions, increased compared to prior examination, with associated atelectasis or consolidation and diffuse bilateral interstitial pulmonary opacity. Cardiomegaly. IMPRESSION: 1. Small to moderate bilateral pleural effusions, increased compared to prior examination, with associated atelectasis or consolidation and diffuse bilateral  interstitial pulmonary opacity. Findings are most consistent with worsened edema. 2. Interval endotracheal and esophagogastric extubation. 3. Cardiomegaly. Electronically Signed   By: Delanna Ahmadi M.D.   On: 06/09/2022 12:25     I spent more than 10 minutes for this patient encounter including review of prior medical records, coordination of care with primary/other specialist   Electronically signed by:   Rosiland Oz, MD Infectious Disease Physician [facility] for Infectious Disease Pager: [phone]

## 2022-06-09 NOTE — Assessment & Plan Note (Addendum)
-   treated with IV lasix 40 mg x 2 on 7/7 and resolved since then. -Continue to be judicious about volume status and fluid resuscitation if needed.

## 2022-06-09 NOTE — Progress Notes (Signed)
Physical Therapy Treatment Patient Details Name: Carolyn Erickson MRN: [id number] DOB: Feb 28, 1946 Today's Date: 06/09/2022   History of Present Illness Ms. Vanhook is a 76 yo with a history of HTN and depression who comes in with weakness and abdominal pain. She has been having some pain generalized for months being treated for gastritis/ ulcer with PPI by PCP and referred to GI. She has not seen GI. She has a history of diarrhea and constipation. She says over the last few days she was feeling weaker and her pain because worse and more severe acutely. She denies any chest pain or SOB. She has no fever. She has been hypotensive and required fluid bolus and is on levophed at 2 in the ED.    PT Comments    Patient demonstrates slow labored movement for sitting up at bedside with most difficulty attempting to move legs due to weakness, frequent leaning/falling forward once seated requiring Min/mod assist to prevent forward leaning, required active assistance to complete most BLE due to weakness with limited ability to lift legs against gravity and limited to a few unsteady labored side steps requiring Max assist using RW before having to sit due to fall risk.  Patient tolerated sitting up in chair after therapy - nursing staff notified.  Patient will benefit from continued skilled physical therapy in hospital and recommended venue below to increase strength, balance, endurance for safe ADLs and gait.     Recommendations for follow up therapy are one component of a multi-disciplinary discharge planning process, led by the attending physician.  Recommendations may be updated based on patient status, additional functional criteria and insurance authorization.  Follow Up Recommendations  Skilled nursing-short term rehab (<3 hours/day) Can patient physically be transported by private vehicle: Yes   Assistance Recommended at Discharge Intermittent Supervision/Assistance  Patient can return home with the  following A lot of help with walking and/or transfers;Help with stairs or ramp for entrance;A lot of help with bathing/dressing/bathroom;Assistance with cooking/housework   Equipment Recommendations  None recommended by PT    Recommendations for Other Services       Precautions / Restrictions Precautions Precautions: Fall Restrictions Weight Bearing Restrictions: No     Mobility  Bed Mobility Overal bed mobility: Needs Assistance Bed Mobility: Supine to Sit     Supine to sit: Max assist     General bed mobility comments: slow labored movement with poor carryover for moving BLE due to weakness    Transfers Overall transfer level: Needs assistance Equipment used: Rolling walker (2 wheels) Transfers: Sit to/from Stand, Bed to chair/wheelchair/BSC Sit to Stand: Mod assist   Step pivot transfers: Mod assist, Max assist       General transfer comment: unsteady labored movement with near loss of balance using RW    Ambulation/Gait     Assistive device: Rolling walker (2 wheels) Gait Pattern/deviations: Step-to pattern, Decreased step length - right, Decreased step length - left, Decreased stride length, Narrow base of support, Trunk flexed, Knees buckling Gait velocity: decreased     General Gait Details: limited to a few slow labored unsteady side steps with near loss of balance due to BLE weakness   Stairs             Wheelchair Mobility    Modified Rankin (Stroke Patients Only)       Balance Overall balance assessment: Needs assistance Sitting-balance support: Feet supported, No upper extremity supported Sitting balance-Leahy Scale: Poor Sitting balance - Comments: fair/poor seated at EOB with  frequent leaning/falling forward Postural control:  (forward leaning) Standing balance support: Reliant on assistive device for balance, During functional activity, Bilateral upper extremity supported Standing balance-Leahy Scale: Poor Standing balance  comment: using RW                            Cognition Arousal/Alertness: Awake/alert Behavior During Therapy: WFL for tasks assessed/performed Overall Cognitive Status: Within Functional Limits for tasks assessed                                          Exercises General Exercises - Lower Extremity Long Arc Quad: Seated, AAROM, Strengthening, Both, 10 reps Hip Flexion/Marching: Seated, AAROM, Strengthening, Both, 10 reps Heel Raises: Seated, AROM, Strengthening, Both, 5 reps    General Comments        Pertinent Vitals/Pain Pain Assessment Pain Assessment: Faces Faces Pain Scale: Hurts a little bit Pain Location: abdomen at surgical site Pain Descriptors / Indicators: Sore, Discomfort, Grimacing Pain Intervention(s): Limited activity within patient's tolerance, Monitored during session, Repositioned    Home Living                          Prior Function            PT Goals (current goals can now be found in the care plan section) Acute Rehab PT Goals Patient Stated Goal: return home PT Goal Formulation: With patient Time For Goal Achievement: 06/21/22 Potential to Achieve Goals: Good Progress towards PT goals: Progressing toward goals    Frequency    Min 3X/week      PT Plan Current plan remains appropriate    Co-evaluation              AM-PAC PT "6 Clicks" Mobility   Outcome Measure  Help needed turning from your back to your side while in a flat bed without using bedrails?: A Lot Help needed moving from lying on your back to sitting on the side of a flat bed without using bedrails?: A Lot Help needed moving to and from a bed to a chair (including a wheelchair)?: A Lot Help needed standing up from a chair using your arms (e.g., wheelchair or bedside chair)?: A Lot Help needed to walk in hospital room?: A Lot Help needed climbing 3-5 steps with a railing? : Total 6 Click Score: 11    End of Session  Equipment Utilized During Treatment: Oxygen Activity Tolerance: Patient tolerated treatment well;Patient limited by fatigue Patient left: in bed;with call bell/phone within reach;with chair alarm set Nurse Communication: Mobility status PT Visit Diagnosis: Unsteadiness on feet (R26.81);Other abnormalities of gait and mobility (R26.89);Muscle weakness (generalized) (M62.81)     Time: 3716-9678 PT Time Calculation (min) (ACUTE ONLY): 27 min  Charges:  $Therapeutic Exercise: 8-22 mins $Therapeutic Activity: 8-22 mins                     12:25 PM, 06/09/22 Lonell Grandchild, MPT Physical Therapist with [facility] [phone] office [phone] mobile phone

## 2022-06-09 NOTE — TOC Progression Note (Signed)
Transition of Care [facility]) - Progression Note    Patient Details  Name: MARYORI WEIDE MRN: [id number] Date of Birth: 1946/06/18  Transition of Care [facility]) CM/SW [place], [place] Phone Number: 06/09/2022, 11:24 AM  Clinical Narrative:    CSW spoke with MD who states pt will be here through the weekend. SNF facility is pending at this time and insurance will be started. TOC to follow.   Expected Discharge Plan: [place] Barriers to Discharge: Continued Medical Work up  Expected Discharge Plan and Services Expected Discharge Plan: [place] In-house Referral: Clinical Social Work   Post Acute Care Choice: [place] Living arrangements for the past 2 months: Single Family Home                                       Social Determinants of Health (SDOH) Interventions    Readmission Risk Interventions     No data to display

## 2022-06-09 NOTE — Progress Notes (Addendum)
PROGRESS NOTE  Carolyn Erickson WSF:[id number] DOB: 03-03-1946 DOA: 06/03/2022 PCP: Dettinger, Fransisca Kaufmann, MD  Brief History:  76 year old female with history of major neurocognitive disorder, hypertension, hyperlipidemia, depression, left breast cancer presenting with generalized weakness and abdominal pain.  Apparently, the patient has been having abdominal pain since April 2023.  The patient had been started on pantoprazole at that time.  There was no improvement.  She followed up with her PCP on 06/02/2022.  Referral was given to see gastroenterology.  Abdominal pain had worsened over the past 2 days prior to admission with worsening generalized weakness.  There is no fevers, chills, chest pain, shortness breath, vomiting, diarrhea.  There is no hematochezia or melena noted. In the emergency department, the patient was hypotensive with a blood pressure of 71/54.  She was somnolent.  CT of the abdomen and pelvis was performed and showed changes consistent with a perforated gastric ulcer.  Dr. Constance Haw was consulted, and the patient was taken to the OR for exploratory laparotomy, gastrorrhaphy, omental patch.  A left IJ central line was also placed.  TRH is consulted for medical management In the ED, the patient had low-grade fever 99.5 F.  She was tachycardic and hypotensive with blood pressure 68/53.  WBC 4.1, hemoglobin 14.6, platelets 308,000.  BMP showed sodium 137, potassium 4.1, bicarbonate 22, serum creatinine 2.14.  Postoperatively, the patient was started on Zosyn and fluconazole.  She remained on the ventilator.  Preoperative chest x-ray showed bibasilar atelectasis.  There was free air under the diaphragm.     Assessment and Plan: * Septic shock ([place]) Presented with hypotension, tachycardia, and lactic acid peaking at 6.8 Weaned off Levophed>> wean as tolerated for SBP greater 90 Secondary to gastric perforation with peritonitis 06/03/22 blood cultures--C. glabrata UA 11-20  WBC>>urine culture unrevealing Completed Zosyn 7/6  disContinued fluconazole per ID, continue echinocandin (micafungin)  Volume overload -- DC IV fluids and IV lasix 40 mg x 2 doses, recheck basic metabolic panel in AM   Fungemia 06/03/22 blood cultures--Candida glabrata -d/c fluconazole per ID consult -continue echinocandin -discussed with general surgery>>removed central line 7/4 for line holiday -will need at least 24 hours central line holiday -repeat blood culture on 7/4 with no growth to date   Hypomagnesemia Repleted  Acute respiratory failure with hypoxia (HCC) Currently PRVC, RR 20, Vt 380, FiO2 0.4 -7/3 personally reviewed CXR--increased interstitial marking -7/3 ABG--7.35/36/107/20 (0.4) -7/3--extubated -7/4-5--stable on 4L [place] -7/6 - down to 2L/min [place]  Major neurocognitive disorder [facility]) Patient had MoCa 17/30 on 03/14/22 -Restart Aricept once stable and clinically -At risk for hospital delirium  Acute renal failure superimposed on stage 3a chronic kidney disease ([place]) -Serum creatinine peaking 2.28 but trending down  -Secondary to hemodynamic changes/sepsis and hypovolemia -Monitor serial BMPs  GERD (gastroesophageal reflux disease) Continue pantoprazole IV twice daily  Gastric perforation ([place]) Suspect this may be due to chronic NSAID use -Continue PPI twice daily -06/04/2022--ex laparotomy with omental patch -Postoperative care per Dr. Constance Haw -TNA therapy on hold temporarily due to need for CVL holiday -UGI 7/6 - no leak seen.  - clears started by surgery team 7/6  Essential hypertension Resume amlodipine 2.5 mg daily  Family Communication:   Family at bedside 7/4, 7/5, 7/6, 7/7  Consultants:  general surgery  Code Status:  FULL   DVT Prophylaxis:  SCDs  Procedures: As Listed in Progress Note Above  Antibiotics: Fluconazole 7/2>>7/4 Micafungin 7/4>> Zosyn 7/2>>7/6  Subjective:  no specific complaints.   Objective: Vitals:   06/08/22  1642 06/08/22 2015 06/09/22 0058 06/09/22 1443  BP: 139/69 135/69 (!) 141/74 (!) 146/79  Pulse: 97 (!) 102 (!) 101 98  Resp: '15 19 17   '$ Temp: (!) 97.5 F (36.4 C) 98.9 F (37.2 C) 98.3 F (36.8 C) 98.6 F (37 C)  TempSrc: Oral Oral Oral Oral  SpO2: 96% 91% 90%   Weight:      Height:        Intake/Output Summary (Last 24 hours) at 06/09/2022 1544 Last data filed at 06/09/2022 0547 Gross per 24 hour  Intake 1448.55 ml  Output 550 ml  Net 898.55 ml   Weight change:  Exam:  General:  remains somnolent but arousable, hard of hearing, NAD, cooperative. HEENT: No icterus, No thrush, No neck mass, [place]/AT Cardiovascular: normal s1,s2 sounds, no m/r/g heard. Respiratory: bilateral bS clear, crackles heard, good air movement, no rales, wheezing heard. Abdomen: soft, nondistended, no masses palpated. Normal BS Extremities: no clubbing or cyanosis, trace pretibial edema.  Data Reviewed: I have personally reviewed following labs and imaging studies Basic Metabolic Panel: Recent Labs  Lab 06/04/22 0444 06/05/22 0357 06/06/22 0352 06/07/22 0412 06/08/22 0344 06/09/22 0503  NA 137 131* 134* 137 140 144  K 4.0 4.6 4.6 4.4 4.7 4.1  CL 108 103 105 108 111 112*  CO2 '23 22 24 24 23 26  '$ GLUCOSE 124* 187* 125* 125* 72 140*  BUN 36* 48* 50* 43* 47* 54*  CREATININE 1.64* 2.28* 2.05* 1.81* 1.82* 1.88*  CALCIUM 8.3* 7.6* 7.8* 8.1* 8.4* 8.9  MG 1.3* 2.1 2.1  --  2.3 2.3  PHOS 3.5 4.5 4.5  --  4.8*  --    Liver Function Tests: Recent Labs  Lab 06/06/22 0352 06/07/22 0412 06/08/22 0344 06/09/22 0503 06/09/22 0905  AST '17 17 16 19 21  '$ ALT '15 14 13 14 14  '$ ALKPHOS 56 74 79 90 89  BILITOT 1.8* 2.4* 2.7* 3.3* 3.2*  PROT 3.9* 4.1* 4.4* 4.6* 4.7*  ALBUMIN <1.5* <1.5* <1.5* <1.5* <1.5*   Recent Labs  Lab 06/03/22 2133  LIPASE 24   No results for input(s): "AMMONIA" in the last 168 hours. Coagulation Profile: Recent Labs  Lab 06/03/22 2045  INR 1.3*   CBC: Recent Labs  Lab  06/03/22 2045 06/03/22 2153 06/04/22 0444 06/05/22 0357 06/06/22 0352 06/07/22 0412 06/08/22 0344 06/09/22 0503  WBC 4.1  --  4.8 22.2* 24.0* 25.2* 23.0* 23.5*  NEUTROABS 2.9  --  2.5  --  20.5*  --  17.9* 18.6*  HGB 14.6   < > 12.4 12.0 10.9* 11.8* 11.7* 12.0  HCT 46.4*   < > 38.4 36.1 33.7* 37.6 37.9 38.3  MCV 95.3  --  94.1 92.3 94.7 94.5 95.5 93.9  PLT 308  --  249 175 137* 121* 132* 122*   < > = values in this interval not displayed.   Cardiac Enzymes: No results for input(s): "CKTOTAL", "CKMB", "CKMBINDEX", "TROPONINI" in the last 168 hours. BNP: Invalid input(s): "POCBNP" CBG: Recent Labs  Lab 06/08/22 2154 06/09/22 0245 06/09/22 0623 06/09/22 0711 06/09/22 1118  GLUCAP 148* 155* 144* 125* 158*   HbA1C: No results for input(s): "HGBA1C" in the last 72 hours. Urine analysis:    Component Value Date/Time   COLORURINE AMBER (A) 06/04/2022 0520   APPEARANCEUR CLOUDY (A) 06/04/2022 0520   APPEARANCEUR Cloudy (A) 04/13/2021 1530   LABSPEC 1.027 06/04/2022 0520   PHURINE 5.0 06/04/2022  [place] 06/04/2022 0520   HGBUR NEGATIVE 06/04/2022 0520   BILIRUBINUR NEGATIVE 06/04/2022 0520   BILIRUBINUR Negative 04/13/2021 [place] 06/04/2022 0520   PROTEINUR 100 (A) 06/04/2022 0520   NITRITE NEGATIVE 06/04/2022 0520   LEUKOCYTESUR SMALL (A) 06/04/2022 0520    Recent Results (from the past 240 hour(s))  Cath Tip Culture     Status: None   Collection Time: 06/03/22  8:07 PM   Specimen: Catheter Tip  Result Value Ref Range Status   Specimen Description CATH TIP  Final   Special Requests NONE  Final   Culture   Final    NO GROWTH 3 DAYS Performed at [facility], [place] [address] [place], [place] [address]    Report Status 06/09/2022 FINAL  Final  Culture, blood (Routine x 2)     Status: Abnormal   Collection Time: 06/03/22  8:45 PM   Specimen: BLOOD  Result Value Ref Range Status   Specimen Description   Final    BLOOD  LEFT ANTECUBITAL Performed at [facility], [place] [address], [place], [place] [address]    Special Requests   Final    BOTTLES DRAWN AEROBIC AND ANAEROBIC Blood Culture adequate volume Performed at [facility], [address] [place], [place] [address]    Culture  Setup Time   Final    YEAST BOTH Gram Stain Report Called to,Read Back By and Verified With: JACOBS, U'@0745'$  BY MATTHEWS, B 7.4.2023 CRITICAL RESULT CALLED TO, READ BACK BY AND VERIFIED WITH: PHARMD S.HURTZ AT 1130 ON 06/06/2022 BY T.SAAD.    Culture (A)  Final    CANDIDA GLABRATA Sent to [place] for further susceptibility testing. Performed at [facility], [place] [address], [place],  [address]    Report Status 06/09/2022 FINAL  Final  Blood Culture ID Panel (Reflexed)     Status: Abnormal   Collection Time: 06/03/22  8:45 PM  Result Value Ref Range Status   Enterococcus faecalis NOT DETECTED NOT DETECTED Final   Enterococcus Faecium NOT DETECTED NOT DETECTED Final   Listeria monocytogenes NOT DETECTED NOT DETECTED Final   Staphylococcus species NOT DETECTED NOT DETECTED Final   Staphylococcus aureus (BCID) NOT DETECTED NOT DETECTED Final   Staphylococcus epidermidis NOT DETECTED NOT DETECTED Final   Staphylococcus lugdunensis NOT DETECTED NOT DETECTED Final   Streptococcus species NOT DETECTED NOT DETECTED Final   Streptococcus agalactiae NOT DETECTED NOT DETECTED Final   Streptococcus pneumoniae NOT DETECTED NOT DETECTED Final   Streptococcus pyogenes NOT DETECTED NOT DETECTED Final   A.calcoaceticus-baumannii NOT DETECTED NOT DETECTED Final   Bacteroides fragilis NOT DETECTED NOT DETECTED Final   Enterobacterales NOT DETECTED NOT DETECTED Final   Enterobacter cloacae complex NOT DETECTED NOT DETECTED Final   Escherichia coli NOT DETECTED NOT DETECTED Final   Klebsiella aerogenes NOT DETECTED NOT DETECTED Final   Klebsiella oxytoca NOT DETECTED NOT DETECTED Final   Klebsiella pneumoniae NOT DETECTED  NOT DETECTED Final   Proteus species NOT DETECTED NOT DETECTED Final   Salmonella species NOT DETECTED NOT DETECTED Final   Serratia marcescens NOT DETECTED NOT DETECTED Final   Haemophilus influenzae NOT DETECTED NOT DETECTED Final   Neisseria meningitidis NOT DETECTED NOT DETECTED Final   Pseudomonas aeruginosa NOT DETECTED NOT DETECTED Final   Stenotrophomonas maltophilia NOT DETECTED NOT DETECTED Final   Candida albicans NOT DETECTED NOT DETECTED Final   Candida auris NOT DETECTED NOT DETECTED Final   Candida  glabrata DETECTED (A) NOT DETECTED Final    Comment: CRITICAL RESULT CALLED TO, READ BACK BY AND VERIFIED WITH: PHARMD S.HURTZ AT 1130 ON 06/06/2022 BY T.SAAD.    Candida krusei NOT DETECTED NOT DETECTED Final   Candida parapsilosis NOT DETECTED NOT DETECTED Final   Candida tropicalis NOT DETECTED NOT DETECTED Final   Cryptococcus neoformans/gattii NOT DETECTED NOT DETECTED Final    Comment: Performed at [facility], [place] [address], [place], [place] [address]  Antifungal AST 9 Drug Panel     Status: None (Preliminary result)   Collection Time: 06/03/22  8:45 PM  Result Value Ref Range Status   Organism ID, Yeast Preliminary report  Final    Comment: (NOTE) Specimen has been received and testing has been initiated. Performed At: [facility] [place], [place] [id number] Rush Farmer MD AJ:[id number]    Amphotericin B MIC PENDING  Incomplete   Anidulafungin MIC PENDING  Incomplete   Caspofungin MIC PENDING  Incomplete   Micafungin MIC PENDING  Incomplete   Posaconazole MIC PENDING  Incomplete   Fluconazole Islt MIC PENDING  Incomplete   Flucytosine MIC PENDING  Incomplete   Itraconazole MIC PENDING  Incomplete   Voriconazole MIC PENDING  Incomplete   Source [id number] [facility] BLD SENSI C GLABRATA  Final    Comment: Performed at [facility], [place] [address], [place], [place] [address]  Culture, blood (Routine x 2)     Status: Abnormal  (Preliminary result)   Collection Time: 06/03/22  9:32 PM   Specimen: BLOOD  Result Value Ref Range Status   Specimen Description   Final    BLOOD LEFT ANTECUBITAL Performed at [facility], [place] [address] [place], [place] [address]    Special Requests   Final    BOTTLES DRAWN AEROBIC AND ANAEROBIC Blood Culture adequate volume Performed at [facility], [address], [place], [place] [address]    Culture  Setup Time   Final    AEROBIC BOTTLE ONLY YEAST Gram Stain Report Called to,Read Back By and Verified With: UTE JACOBS @ 1200 ON 06/06/22 C VARNER CRITICAL VALUE NOTED.  VALUE IS CONSISTENT WITH PREVIOUSLY REPORTED AND CALLED VALUE. Performed at [facility], [place] [address], [place], [place] [address]    Culture CANDIDA GLABRATA (A)  Final   Report Status PENDING  Incomplete  MRSA Next Gen by PCR, Nasal     Status: None   Collection Time: 06/04/22  2:18 AM   Specimen: Nasal Mucosa; Nasal Swab  Result Value Ref Range Status   MRSA by PCR Next Gen NOT DETECTED NOT DETECTED Final    Comment: (NOTE) The GeneXpert MRSA Assay (FDA approved for NASAL specimens only), is one component of a comprehensive MRSA colonization surveillance program. It is not intended to diagnose MRSA infection nor to guide or monitor treatment for MRSA infections. Test performance is not FDA approved in patients less than 72 years old. Performed at [facility], [address], [place], [place] [address]   Urine Culture     Status: Abnormal   Collection Time: 06/04/22  2:28 AM   Specimen: PATH Cytology Urine  Result Value Ref Range Status   Specimen Description   Final    URINE, CLEAN CATCH Performed at [facility], [address], [place], [place] [address]    Special Requests   Final    NONE Performed at [facility], [address] [place],  [address]  Culture (A)  Final    <10,000 COLONIES/mL INSIGNIFICANT GROWTH Performed at [place] [address],  [place], [place] [address]    Report Status 06/05/2022 FINAL  Final  Aerobic/Anaerobic Culture w Gram Stain (surgical/deep wound)     Status: None   Collection Time: 06/04/22  3:03 AM   Specimen: PATH Cytology FNA; Body Fluid  Result Value Ref Range Status   Specimen Description   Final    ANAL Performed at [facility], [address] [place], [place] [address]    Special Requests   Final    NONE Performed at [facility], [address], [place], [place] [address]    Gram Stain   Final    MODERATE WBC PRESENT,BOTH PMN AND MONONUCLEAR FEW GRAM POSITIVE COCCI IN PAIRS MODERATE GRAM POSITIVE RODS    Culture   Final    FEW CANDIDA ALBICANS NO ANAEROBES ISOLATED Performed at [facility], [place] [address] [place], [place] [address]    Report Status 06/09/2022 FINAL  Final  Culture, blood (Routine X 2) w Reflex to ID Panel     Status: None (Preliminary result)   Collection Time: 06/06/22  9:32 AM   Specimen: BLOOD LEFT WRIST  Result Value Ref Range Status   Specimen Description BLOOD LEFT WRIST  Final   Special Requests   Final    BOTTLES DRAWN AEROBIC ONLY Blood Culture results may not be optimal due to an inadequate volume of blood received in culture bottles   Culture   Final    NO GROWTH 3 DAYS Performed at [facility], [address] [place], [place] [address]    Report Status PENDING  Incomplete  Culture, blood (Routine X 2) w Reflex to ID Panel     Status: None (Preliminary result)   Collection Time: 06/06/22  9:33 AM   Specimen: Left Antecubital; Blood  Result Value Ref Range Status   Specimen Description LEFT ANTECUBITAL  Final   Special Requests   Final    BOTTLES DRAWN AEROBIC AND ANAEROBIC Blood Culture adequate volume   Culture   Final    NO GROWTH 3 DAYS Performed at [facility], [address] [place], [place] [address]    Report Status PENDING  Incomplete     Scheduled Meds:  amLODipine  2.5 mg Oral Daily   Chlorhexidine Gluconate Cloth  6 each  Topical Daily   furosemide  40 mg Intravenous Q12H   heparin  5,000 Units Subcutaneous Q8H   insulin aspart  0-9 Units Subcutaneous TID WC   mouth rinse  15 mL Mouth Rinse Q4H   pantoprazole (PROTONIX) IV  40 mg Intravenous Q12H   potassium chloride  40 mEq Oral Once   Continuous Infusions:  micafungin (MYCAMINE) 100 mg in sodium chloride 0.9 % 100 mL IVPB 100 mg (06/09/22 1441)    Procedures/Studies: DG CHEST PORT 1 VIEW  Result Date: 06/09/2022 CLINICAL DATA:  Weakness, wheezing EXAM: PORTABLE CHEST 1 VIEW COMPARISON:  06/05/2022 FINDINGS: Interval endotracheal and esophagogastric extubation. Small to moderate bilateral pleural effusions, increased compared to prior examination, with associated atelectasis or consolidation and diffuse bilateral interstitial pulmonary opacity. Cardiomegaly. IMPRESSION: 1. Small to moderate bilateral pleural effusions, increased compared to prior examination, with associated atelectasis or consolidation and diffuse bilateral interstitial pulmonary opacity. Findings are most consistent with worsened edema. 2. Interval endotracheal and esophagogastric extubation. 3. Cardiomegaly. Electronically Signed   By: Delanna Ahmadi M.D.   On: 06/09/2022 12:25  DG UGI W SMALL BOWEL  Result Date: 06/08/2022 CLINICAL DATA:  Post repair of 1.5 cm diameter perforated anterior wall gastric antral ulcer EXAM: WATER SOLUBLE UPPER GI SERIES TECHNIQUE: Single-column upper GI series was performed using water soluble contrast. Radiation Exposure Index (as provided by the fluoroscopic device): 43.1 mGy Kerma CONTRAST:  100 cc Omnipaque 300 via NG tube COMPARISON:  CT abdomen and pelvis 06/03/2022 FLUOROSCOPY: Fluoroscopy Time:  2 minutes 36 seconds Radiation Exposure Index (if provided by the fluoroscopic device): 43.1 Number of Acquired Spot Images: Multiple fluoroscopic screen captures and cine series FINDINGS: Contrast administered via NG tube opacifies the gastric lumen. Patent pylorus  with passage of contrast through normal appearing duodenal C loop into proximal jejunum with incidentally noted duodenal diverticulum at second portion. Irregularity of rugal folds with thickening and distortion at anterior wall of gastric antrum at site of perforation repair. The third portion of the duodenum is superimposed with the inferior wall of the gastric antrum, mildly limiting exam. A blush of contrast is seen inferior to the antrum, superimposed with the third portion of the duodenum throughout the study. This appears to move in concert with the duodenum with respiration. No contrast opacification of the JP drain. No gross evidence of contrast leak identified though it is difficult to completely exclude a small leak at the site of repair. IMPRESSION: Distortion and thickening of rugal folds at site of antral gastric ulcer repair. No gross evidence of leak at the site of ulcer repair as discussed above. Electronically Signed   By: Lavonia Dana M.D.   On: 06/08/2022 15:32   ECHOCARDIOGRAM COMPLETE  Result Date: 06/07/2022    ECHOCARDIOGRAM REPORT   Patient Name:   Carolyn Erickson Date of Exam: 06/07/2022 Medical Rec #:  [id number]       Height:       61.0 in Accession #:    [id number]      Weight:       112.2 lb Date of Birth:  1946-04-25        BSA:          1.477 m Patient Age:    66 years        BP:           116/46 mmHg Patient Gender: F               HR:           104 bpm. Exam Location:  [facility] Procedure: 2D Echo, Cardiac Doppler and Color Doppler Indications:    Bacteremia  History:        Patient has no prior history of Echocardiogram examinations.                 Signs/Symptoms:Bacteremia; Risk Factors:Hypertension and                 Dyslipidemia. Breast CA.  Sonographer:    Wenda Low Referring Phys: [id number] [place]  1. Abnormal septal motion . Left ventricular ejection fraction, by estimation, is 55 to 60%. The left ventricle has normal function. The left ventricle has no  regional wall motion abnormalities. Left ventricular diastolic parameters were normal.  2. Right ventricular systolic function is normal. The right ventricular size is normal. There is mildly elevated pulmonary artery systolic pressure.  3. Moderate pleural effusion in the left lateral region.  4. The mitral valve is abnormal. Trivial mitral valve regurgitation. No evidence of mitral stenosis.  5. Thickening and calcification  noted . The tricuspid valve is abnormal.  6. Some thickening in the LVOT near intervalvular fibrosa Given this and nodular calcification of PV suggest TEE if suspicion for SBE high. The aortic valve is tricuspid. There is moderate calcification of the aortic valve. There is moderate thickening of the aortic valve. Aortic valve regurgitation is mild. Aortic valve sclerosis/calcification is present, without any evidence of aortic stenosis.  7. Nodular calcification seen on PV. The pulmonic valve was abnormal.  8. The inferior vena cava is normal in size with greater than 50% respiratory variability, suggesting right atrial pressure of 3 mmHg. FINDINGS  Left Ventricle: Abnormal septal motion. Left ventricular ejection fraction, by estimation, is 55 to 60%. The left ventricle has normal function. The left ventricle has no regional wall motion abnormalities. The left ventricular internal cavity size was normal in size. There is no left ventricular hypertrophy. Left ventricular diastolic parameters were normal. Right Ventricle: The right ventricular size is normal. No increase in right ventricular wall thickness. Right ventricular systolic function is normal. There is mildly elevated pulmonary artery systolic pressure. The tricuspid regurgitant velocity is 3.21  m/s, and with an assumed right atrial pressure of 3 mmHg, the estimated right ventricular systolic pressure is 72.5 mmHg. Left Atrium: Left atrial size was normal in size. Right Atrium: Right atrial size was normal in size. Pericardium: There  is no evidence of pericardial effusion. Mitral Valve: The mitral valve is abnormal. There is mild thickening of the mitral valve leaflet(s). There is mild calcification of the mitral valve leaflet(s). Mild mitral annular calcification. Trivial mitral valve regurgitation. No evidence of mitral valve stenosis. MV peak gradient, 3.9 mmHg. The mean mitral valve gradient is 1.0 mmHg. Tricuspid Valve: Thickening and calcification noted. The tricuspid valve is abnormal. Tricuspid valve regurgitation is trivial. No evidence of tricuspid stenosis. Aortic Valve: Some thickening in the LVOT near intervalvular fibrosa Given this and nodular calcification of PV suggest TEE if suspicion for SBE high. The aortic valve is tricuspid. There is moderate calcification of the aortic valve. There is moderate thickening of the aortic valve. Aortic valve regurgitation is mild. Aortic valve sclerosis/calcification is present, without any evidence of aortic stenosis. Aortic valve mean gradient measures 5.0 mmHg. Aortic valve peak gradient measures 10.3 mmHg. Aortic valve area, by VTI measures 2.02 cm. Pulmonic Valve: Nodular calcification seen on PV. The pulmonic valve was abnormal. Pulmonic valve regurgitation is trivial. No evidence of pulmonic stenosis. Aorta: The aortic root is normal in size and structure. Venous: The inferior vena cava is normal in size with greater than 50% respiratory variability, suggesting right atrial pressure of 3 mmHg. IAS/Shunts: No atrial level shunt detected by color flow Doppler. Additional Comments: There is a moderate pleural effusion in the left lateral region.  LEFT VENTRICLE PLAX 2D LVIDd:         4.50 cm     Diastology LVIDs:         3.00 cm     LV e' medial:    7.29 cm/s LV PW:         0.80 cm     LV E/e' medial:  11.8 LV IVS:        0.80 cm     LV e' lateral:   9.79 cm/s LVOT diam:     1.80 cm     LV E/e' lateral: 8.8 LV SV:         55 LV SV Index:   37 LVOT Area:     2.54  cm  LV Volumes (MOD) LV  vol d, MOD A2C: 43.7 ml LV vol d, MOD A4C: 52.7 ml LV vol s, MOD A2C: 17.3 ml LV vol s, MOD A4C: 23.8 ml LV SV MOD A2C:     26.4 ml LV SV MOD A4C:     52.7 ml LV SV MOD BP:      28.6 ml RIGHT VENTRICLE RV Basal diam:  3.10 cm RV Mid diam:    2.90 cm RV S prime:     14.50 cm/s TAPSE (M-mode): 3.0 cm LEFT ATRIUM             Index        RIGHT ATRIUM           Index LA diam:        3.60 cm 2.44 cm/m   RA Area:     11.50 cm LA Vol (A2C):   50.9 ml 34.45 ml/m  RA Volume:   21.80 ml  14.76 ml/m LA Vol (A4C):   43.7 ml 29.58 ml/m LA Biplane Vol: 49.8 ml 33.71 ml/m  AORTIC VALVE                     PULMONIC VALVE AV Area (Vmax):    1.92 cm      PV Vmax:       0.98 m/s AV Area (Vmean):   1.84 cm      PV Peak grad:  3.9 mmHg AV Area (VTI):     2.02 cm AV Vmax:           160.50 cm/s AV Vmean:          103.550 cm/s AV VTI:            0.274 m AV Peak Grad:      10.3 mmHg AV Mean Grad:      5.0 mmHg LVOT Vmax:         121.00 cm/s LVOT Vmean:        74.800 cm/s LVOT VTI:          0.218 m LVOT/AV VTI ratio: 0.79  AORTA Ao Root diam: 3.30 cm MITRAL VALVE               TRICUSPID VALVE MV Area (PHT): 6.02 cm    TR Peak grad:   41.2 mmHg MV Area VTI:   2.25 cm    TR Vmax:        321.00 cm/s MV Peak grad:  3.9 mmHg MV Mean grad:  1.0 mmHg    SHUNTS MV Vmax:       0.99 m/s    Systemic VTI:  0.22 m MV Vmean:      48.8 cm/s   Systemic Diam: 1.80 cm MV Decel Time: 126 msec MV E velocity: 86.00 cm/s MV A velocity: 72.70 cm/s MV E/A ratio:  1.18 Jenkins Rouge MD Electronically signed by Jenkins Rouge MD Signature Date/Time: 06/07/2022/10:28:36 AM    Final    DG CHEST PORT 1 VIEW  Result Date: 06/05/2022 CLINICAL DATA:  76 year old female with respiratory failure. Intubated. EXAM: PORTABLE CHEST 1 VIEW COMPARISON:  Portable chest 06/04/2022 and earlier. FINDINGS: Portable AP semi upright view at 0527 hours. Endotracheal tube tip in good position between the clavicles and carina. Stable left IJ central line and visible enteric tube.  Increased left lung base and retrocardiac opacity now mostly obscuring the left hemidiaphragm. Slightly lower lung volumes overall. Mediastinal contours remain normal. No pneumothorax, pulmonary edema,  or definite effusion. Negative right lung. Partially visible midline abdominal skin staples. Paucity of bowel gas in the upper abdomen. Stable left chest and axillary surgical clips. IMPRESSION: 1. Stable lines and tubes. 2. Increased left lower lobe collapse or consolidation since yesterday. Electronically Signed   By: Genevie Ann M.D.   On: 06/05/2022 08:26   DG Chest Port 1 View  Result Date: 06/04/2022 CLINICAL DATA:  Endotracheal tube, nasogastric tube and central line placement. EXAM: PORTABLE CHEST 1 VIEW COMPARISON:  June 03, 2022 FINDINGS: Since the prior study there has been interval placement of an endotracheal tube. Its distal tip is seen approximately 3.8 cm from the carina. Interval nasogastric tube placement is also seen. Its distal and extends into the gastric lumen. There is a left internal jugular venous catheter with its distal tip overlying the distal superior vena cava. This is approximately 6 mm proximal to the junction of the superior vena cava and right atrium. The heart size and mediastinal contours are within normal limits. Mild to moderate severity atelectasis and/or infiltrate is seen within the left lung base. This represents a new finding when compared to the prior study. There is no evidence of a pleural effusion or pneumothorax. Radiopaque surgical clips are seen along the left axilla. The air seen below the right hemidiaphragm on the prior study is no longer visualized. The visualized skeletal structures are unremarkable. IMPRESSION: 1. Interval endotracheal tube, nasogastric tube and left internal jugular venous catheter placement positioning, as described above. 2. Mild to moderate severity left basilar atelectasis and/or infiltrate. Electronically Signed   By: Virgina Norfolk M.D.    On: 06/04/2022 02:47   CT Abdomen Pelvis Wo Contrast  Result Date: 06/03/2022 CLINICAL DATA:  Severe weakness for 2 days with acute abdominal pain, initial encounter EXAM: CT ABDOMEN AND PELVIS WITHOUT CONTRAST TECHNIQUE: Multidetector CT imaging of the abdomen and pelvis was performed following the standard protocol without IV contrast. RADIATION DOSE REDUCTION: This exam was performed according to the departmental dose-optimization program which includes automated exposure control, adjustment of the mA and/or kV according to patient size and/or use of iterative reconstruction technique. COMPARISON:  None Available. FINDINGS: Lower chest: Basilar atelectatic changes are noted. Hepatobiliary: Liver is within normal limits. Gallbladder demonstrates a normal appearance. Pancreas: Unremarkable. No pancreatic ductal dilatation or surrounding inflammatory changes. Spleen: Normal in size without focal abnormality. Adrenals/Urinary Tract: Adrenal glands are within normal limits. Kidneys demonstrate tiny nonobstructing stones on right in the lower pole. No ureteral obstruction is seen. The bladder is decompressed. Stomach/Bowel: Considerable fecal material is noted within the rectal vault which may represent a focal impaction. Diverticular change of the colon is noted as well as some generalized wall thickening throughout the colon. The appendix is within normal limits. Small bowel is within normal limits. Stomach demonstrates a defect in the anterior gastric wall consistent with a perforated ulcer. There is a considerable amount of air and fluid throughout the abdomen with apparent direct communication with the gastric best seen on image number 36 of series 2 and image number 78 of series 6. Vascular/Lymphatic: Aortic atherosclerosis. No enlarged abdominal or pelvic lymph nodes. Reproductive: Status post hysterectomy. No adnexal masses. Other: Free air and free fluid similar to that described above throughout the  abdomen. The bowel demonstrates some reactive wall thickening related to the fluid and air. Musculoskeletal: Degenerative changes of the lumbar spine are seen. No other bony abnormality is noted. IMPRESSION: Changes consistent with a perforated gastric ulcer anteriorly with considerable spillage  of fluid and air into the abdominal cavity. Some reactive wall thickening is noted within the bowel loops secondary to these changes. Critical Value/emergent results were called by telephone at the time of interpretation on 06/03/2022 at 10:43 pm to Dr. Aletta Edouard , who verbally acknowledged these results. Electronically Signed   By: Inez Catalina M.D.   On: 06/03/2022 22:45   DG Chest Port 1 View  Result Date: 06/03/2022 CLINICAL DATA:  Weakness. EXAM: PORTABLE CHEST 1 VIEW COMPARISON:  None Available. FINDINGS: Multiple overlying radiopaque cardiac lead wires are seen. The heart size and mediastinal contours are within normal limits. Mild, diffuse, chronic appearing increased lung markings are seen. There is no evidence of an acute infiltrate, pleural effusion or pneumothorax. Radiopaque surgical clips are seen along the lateral aspect of the left chest wall. A crescentic area of air is seen just below the right hemidiaphragm. Degenerative changes seen throughout the thoracic spine. IMPRESSION: 1. Chronic appearing increased lung markings without evidence of acute or active cardiopulmonary disease. 2. Air seen just below the right hemidiaphragm which may represent an air filled loop of colon. Further evaluation with abdomen pelvis CT is recommended, as the presence of intra-abdominal free air cannot be excluded. Electronically Signed   By: Virgina Norfolk M.D.   On: 06/03/2022 21:22    Time spent: 35 mins   Cederick Broadnax Wynetta Emery, MD How to contact the [facility] Attending or Consulting provider [place] or covering provider during after hours [place], for this patient?  Check the care team in [facility] and look for a)  attending/consulting [facility] provider listed and b) the [facility] team listed Log into [email or web address] and use [place]'s universal password to access. If you do not have the password, please contact the hospital operator. Locate the [facility] provider you are looking for under [facility] and page to a number that you can be directly reached. If you still have difficulty reaching the provider, please page the [facility] (Director on Call) for the Hospitalists listed on amion for assistance.   [facility]  If 7PM-7AM, please contact night-coverage [email or web address] Password [email or web address] 06/09/2022, 3:44 PM   LOS: 5 days

## 2022-06-09 NOTE — Progress Notes (Addendum)
I was present with the medical student for this service. I personally verified the history of present illness, performed the physical exam, and made the plan for this encounter. I have verified the medical student's documentation and made modifications where appropriately. I have personally documented in my own words a brief history, physical, and plan below.     Up to chair and follows commands can answer questions. Some increased work of breathing. CXR pending. JP drain is now more dark bloody from the serous yesterday. UGI was negative for a leak so this could be old blood versus a small leak.  Son at bedside and explained this.  Bilirubin has been up and ordered hepatic function, it is th direct bilirubin. No idea why this would be up outside of TPN but may need imaging if continues to go up.   PRN for pain IS, OOB, PT working with her  Plan for [place],? JP drain with darker fluid,. Need to monitor for additional changes or higher output, could be a small leak not seeing in UGI, may need CT with oral contrast and prone position after drinking if continues  T bili up, direct when hepatic function done, ? TPN versus other etiology, may need additional imaging if worsens?  Antibiotics for intraperitoneal cultures with bacteria and fungus, finished the zosyn for 5 days post op, and fungemia on Blood cultures from the ED, on micafungin, ID following, holding on TEE given the gastric perf and manipulation into the stomach per cardiology  All lines out SCDs, heparin RN getting honeycomb out and changing JP dressing Dr. Arnoldo Morale aware of drain changes and concern for leak give change Labs tomorrow  Updated son at bedside.  Curlene Labrum, MD [facility] [address] [place], [place] [address] [phone] (office)   [facility] Progress Note  5 Days Post-Op  Subjective: Patient is sleeping, husband in room with patient. RN  reports patient has not drank fluids this morning yet, husband is unsure how much patient drank overnight. Patient takes deep breaths while sleeping on [place] O2.  Objective: Vital signs in last 24 hours: Temp:  [97.5 F (36.4 C)-98.9 F (37.2 C)] 98.3 F (36.8 C) (07/07 0058) Pulse Rate:  [97-103] 101 (07/07 0058) Resp:  [15-20] 17 (07/07 0058) BP: (125-141)/(58-74) 141/74 (07/07 0058) SpO2:  [90 %-100 %] 90 % (07/07 0058) Last BM Date :  (None during admission)  Intake/Output from previous day: 07/06 0701 - 07/07 0700 In: 1448.6 [I.V.:1130.3; IV Piggyback:318.2] Out: 550 [Urine:500; Drains:50] Intake/Output this shift: No intake/output data recorded.  General appearance: patient sleeping on exam Resp: deeper breathing while asleep on 2L [place], good air movement on auscultation Cardio: regular rhythm, no murmurs Skin: warm, dry Abd- Midline honeycomb in place, JP drain with browner fluid, old blood, thin   Lab Results:  Recent Labs    06/08/22 0344 06/09/22 0503  WBC 23.0* 23.5*  HGB 11.7* 12.0  HCT 37.9 38.3  PLT 132* 122*   BMET Recent Labs    06/08/22 0344 06/09/22 0503  NA 140 144  K 4.7 4.1  CL 111 112*  CO2 23 26  GLUCOSE 72 140*  BUN 47* 54*  CREATININE 1.82* 1.88*  CALCIUM 8.4* 8.9   PT/INR No results for input(s): "LABPROT", "INR" in the last 72 hours.  Studies/Results: DG UGI W SMALL BOWEL  Result Date: 06/08/2022 CLINICAL DATA:  Post repair of 1.5 cm diameter perforated anterior wall gastric antral ulcer EXAM: WATER SOLUBLE  UPPER GI SERIES TECHNIQUE: Single-column upper GI series was performed using water soluble contrast. Radiation Exposure Index (as provided by the fluoroscopic device): 43.1 mGy Kerma CONTRAST:  100 cc Omnipaque 300 via NG tube COMPARISON:  CT abdomen and pelvis 06/03/2022 FLUOROSCOPY: Fluoroscopy Time:  2 minutes 36 seconds Radiation Exposure Index (if provided by the fluoroscopic device): 43.1 Number of Acquired Spot Images: Multiple  fluoroscopic screen captures and cine series FINDINGS: Contrast administered via NG tube opacifies the gastric lumen. Patent pylorus with passage of contrast through normal appearing duodenal C loop into proximal jejunum with incidentally noted duodenal diverticulum at second portion. Irregularity of rugal folds with thickening and distortion at anterior wall of gastric antrum at site of perforation repair. The third portion of the duodenum is superimposed with the inferior wall of the gastric antrum, mildly limiting exam. A blush of contrast is seen inferior to the antrum, superimposed with the third portion of the duodenum throughout the study. This appears to move in concert with the duodenum with respiration. No contrast opacification of the JP drain. No gross evidence of contrast leak identified though it is difficult to completely exclude a small leak at the site of repair. IMPRESSION: Distortion and thickening of rugal folds at site of antral gastric ulcer repair. No gross evidence of leak at the site of ulcer repair as discussed above. Electronically Signed   By: Lavonia Dana M.D.   On: 06/08/2022 15:32    Anti-infectives: Anti-infectives (From admission, onward)    Start     Dose/Rate Route Frequency Ordered Stop   06/07/22 0200  fluconazole (DIFLUCAN) IVPB 200 mg  Status:  Discontinued        200 mg 100 mL/hr over 60 Minutes Intravenous Every 24 hours 06/06/22 0901 06/06/22 1031   06/06/22 1200  micafungin (MYCAMINE) 100 mg in sodium chloride 0.9 % 100 mL IVPB        100 mg 105 mL/hr over 1 Hours Intravenous Every 24 hours 06/06/22 1031     06/05/22 1800  piperacillin-tazobactam (ZOSYN) IVPB 3.375 g        3.375 g 12.5 mL/hr over 240 Minutes Intravenous Every 12 hours 06/05/22 0825 06/08/22 2157   06/04/22 0600  piperacillin-tazobactam (ZOSYN) IVPB 3.375 g  Status:  Discontinued        3.375 g 12.5 mL/hr over 240 Minutes Intravenous Every 8 hours 06/04/22 0219 06/05/22 0825   06/04/22 0315   fluconazole (DIFLUCAN) IVPB 200 mg  Status:  Discontinued        200 mg 100 mL/hr over 60 Minutes Intravenous Every 24 hours 06/04/22 0217 06/04/22 0219   06/04/22 0315  fluconazole (DIFLUCAN) IVPB 200 mg  Status:  Discontinued        200 mg 100 mL/hr over 60 Minutes Intravenous Every 24 hours 06/04/22 0219 06/06/22 0901   06/03/22 2130  ceFEPIme (MAXIPIME) 2 g in sodium chloride 0.9 % 100 mL IVPB        2 g 200 mL/hr over 30 Minutes Intravenous  Once 06/03/22 2127 06/03/22 2209   06/03/22 2130  metroNIDAZOLE (FLAGYL) IVPB 500 mg  Status:  Discontinued        500 mg 100 mL/hr over 60 Minutes Intravenous Every 12 hours 06/03/22 2127 06/04/22 0217       Assessment/Plan: s/p Procedure(s): POD 5; EXPLORATORY LAPAROTOMY with omental patch   Patient is a 76 y.o. F who underwent an EXPLORATORY LAPAROTOMY with omental patch and presented with septic shock.  - PRN for pain -  Patient on [facility], takes deep breaths while asleep with good air flow. - NG tube D/C yesterday (7/6) as UGI showed no leak. Continue PPI BID - Continue to monitor if patient tolerates fluid intake - Continue line holiday and can reassess if ID team requires prolonged IV treatment or patient requires more TPN - Positive fungal cultures during patient's ED presentation prior to any CVL or arterial line; 7/4 blood culture pending   LOS: 5 days    Pirapat [place], Medical Student 06/09/2022

## 2022-06-09 NOTE — Telephone Encounter (Signed)
Tried to contact patient again, received voicemail.  Three attempts have been made and encounter will be closed.

## 2022-06-10 DIAGNOSIS — N179 Acute kidney failure, unspecified: Secondary | ICD-10-CM | POA: Diagnosis not present

## 2022-06-10 DIAGNOSIS — A419 Sepsis, unspecified organism: Secondary | ICD-10-CM | POA: Diagnosis not present

## 2022-06-10 DIAGNOSIS — K255 Chronic or unspecified gastric ulcer with perforation: Secondary | ICD-10-CM | POA: Diagnosis not present

## 2022-06-10 DIAGNOSIS — B49 Unspecified mycosis: Secondary | ICD-10-CM | POA: Diagnosis not present

## 2022-06-10 LAB — CBC WITH DIFFERENTIAL/PLATELET
Abs Immature Granulocytes: 0.81 10*3/uL — ABNORMAL HIGH (ref 0.00–0.07)
Basophils Absolute: 0.2 10*3/uL — ABNORMAL HIGH (ref 0.0–0.1)
Basophils Relative: 1 %
Eosinophils Absolute: 0.4 10*3/uL (ref 0.0–0.5)
Eosinophils Relative: 2 %
HCT: 38.9 % (ref 36.0–46.0)
Hemoglobin: 12.4 g/dL (ref 12.0–15.0)
Immature Granulocytes: 3 %
Lymphocytes Relative: 9 %
Lymphs Abs: 2.2 10*3/uL (ref 0.7–4.0)
MCH: 29.8 pg (ref 26.0–34.0)
MCHC: 31.9 g/dL (ref 30.0–36.0)
MCV: 93.5 fL (ref 80.0–100.0)
Monocytes Absolute: 1.2 10*3/uL — ABNORMAL HIGH (ref 0.1–1.0)
Monocytes Relative: 5 %
Neutro Abs: 20.8 10*3/uL — ABNORMAL HIGH (ref 1.7–7.7)
Neutrophils Relative %: 80 %
Platelets: 156 10*3/uL (ref 150–400)
RBC: 4.16 MIL/uL (ref 3.87–5.11)
RDW: 14.1 % (ref 11.5–15.5)
WBC: 25.6 10*3/uL — ABNORMAL HIGH (ref 4.0–10.5)
nRBC: 0 % (ref 0.0–0.2)

## 2022-06-10 LAB — COMPREHENSIVE METABOLIC PANEL
ALT: 14 U/L (ref 0–44)
AST: 22 U/L (ref 15–41)
Albumin: <1.5 g/dL — ABNORMAL LOW (ref 3.5–5.0)
Alkaline Phosphatase: 91 U/L (ref 38–126)
Anion gap: 8 (ref 5–15)
BUN: 54 mg/dL — ABNORMAL HIGH (ref 8–23)
CO2: 29 mmol/L (ref 22–32)
Calcium: 9.2 mg/dL (ref 8.9–10.3)
Chloride: 109 mmol/L (ref 98–111)
Creatinine, Ser: 1.97 mg/dL — ABNORMAL HIGH (ref 0.44–1.00)
GFR, Estimated: 26 mL/min — ABNORMAL LOW (ref >60)
Glucose, Bld: 112 mg/dL — ABNORMAL HIGH (ref 70–99)
Potassium: 3.9 mmol/L (ref 3.5–5.1)
Sodium: 146 mmol/L — ABNORMAL HIGH (ref 135–145)
Total Bilirubin: 3.8 mg/dL — ABNORMAL HIGH (ref 0.3–1.2)
Total Protein: 4.7 g/dL — ABNORMAL LOW (ref 6.5–8.1)

## 2022-06-10 LAB — BILIRUBIN, DIRECT: Bilirubin, Direct: 2.6 mg/dL — ABNORMAL HIGH (ref 0.0–0.2)

## 2022-06-10 LAB — GLUCOSE, CAPILLARY
Glucose-Capillary: 76 mg/dL (ref 70–99)
Glucose-Capillary: 70 mg/dL (ref 70–99)
Glucose-Capillary: 141 mg/dL — ABNORMAL HIGH (ref 70–99)
Glucose-Capillary: 153 mg/dL — ABNORMAL HIGH (ref 70–99)

## 2022-06-10 LAB — MAGNESIUM: Magnesium: 2 mg/dL (ref 1.7–2.4)

## 2022-06-10 NOTE — Assessment & Plan Note (Addendum)
-  secondary to persistent leak. -Continue to monitor complete metabolic panel intermittently.  -Last bilirubin level 2.9.

## 2022-06-10 NOTE — Progress Notes (Signed)
PROGRESS NOTE  Carolyn Erickson:[id number] DOB: 16-Dec-1945 DOA: 06/03/2022 PCP: Dettinger, Fransisca Kaufmann, MD  Brief History:  76 year old female with history of major neurocognitive disorder, hypertension, hyperlipidemia, depression, left breast cancer presenting with generalized weakness and abdominal pain.  Apparently, the patient has been having abdominal pain since April 2023.  The patient had been started on pantoprazole at that time.  There was no improvement.  She followed up with her PCP on 06/02/2022.  Referral was given to see gastroenterology.  Abdominal pain had worsened over the past 2 days prior to admission with worsening generalized weakness.  There is no fevers, chills, chest pain, shortness breath, vomiting, diarrhea.  There is no hematochezia or melena noted.  In the emergency department, the patient was hypotensive with a blood pressure of 71/54.  She was somnolent.  CT of the abdomen and pelvis was performed and showed changes consistent with a perforated gastric ulcer.  Dr. Constance Haw was consulted, and the patient was taken to the OR for exploratory laparotomy, gastrorrhaphy, omental patch.  A left IJ central line was also placed.  TRH is consulted for medical management  In the ED, the patient had low-grade fever 99.5 F.  She was tachycardic and hypotensive with blood pressure 68/53.  WBC 4.1, hemoglobin 14.6, platelets 308,000.  BMP showed sodium 137, potassium 4.1, bicarbonate 22, serum creatinine 2.14.  Postoperatively, the patient was started on Zosyn and fluconazole.  She remained on the ventilator.  Preoperative chest x-ray showed bibasilar atelectasis.  There was free air under the diaphragm.   Assessment and Plan: * Septic shock ([place]) Presented with hypotension, tachycardia, and lactic acid peaking at 6.8 Weaned off Levophed>> wean as tolerated for SBP greater 90 Secondary to gastric perforation with peritonitis 06/03/22 blood cultures--C. glabrata UA 11-20 WBC>>urine  culture unrevealing Completed Zosyn 7/6  discontinued fluconazole per ID, continue echinocandin (micafungin)  Hyperbilirubinemia -- follow up direct bilirubin test that is still pending   Volume overload -- DC IV fluids and IV lasix 40 mg x 2 doses, recheck basic metabolic panel in AM   Fungemia 06/03/22 blood cultures--Candida glabrata -d/c fluconazole per ID consult -continue echinocandin -discussed with general surgery>>removed central line 7/4 for line holiday -will need at least 24 hours central line holiday -repeat blood culture on 7/4 with no growth to date   Hypomagnesemia Repleted  Acute respiratory failure with hypoxia (HCC) Currently PRVC, RR 20, Vt 380, FiO2 0.4 -7/3 personally reviewed CXR--increased interstitial marking -7/3 ABG--7.35/36/107/20 (0.4) -7/3--extubated -7/4-5--stable on 4L [place] -7/6 - down to 2L/min [place]  Major neurocognitive disorder [facility]) Patient had MoCa 17/30 on 03/14/22 -Restart Aricept once stable and clinically -At risk for hospital delirium but family remains close at bedside which is helping a lot  Acute renal failure superimposed on stage 3a chronic kidney disease ([place]) -Serum creatinine peaking 2.28 but trending down  -Secondary to hemodynamic changes/sepsis and hypovolemia -Monitor serial BMPs  GERD (gastroesophageal reflux disease) Continue pantoprazole IV twice daily  Gastric perforation ([place]) Suspect this may be due to chronic NSAID use -Continue PPI twice daily -06/04/2022--ex laparotomy with omental patch -Postoperative care per Dr. Constance Haw -TNA therapy on hold temporarily due to need for CVL holiday -UGI 7/6 - no leak seen.  - clears started by surgery team 7/6  Essential hypertension Resumed amlodipine 2.5 mg daily  Mixed hyperlipidemia -- holding home atorvastatin   Family Communication:   Family at bedside 7/4, 7/5, 7/6, 7/7, 7/8  Consultants:  general surgery  Code Status:  FULL   DVT Prophylaxis:   SCDs  Procedures: As Listed in Progress Note Above  Antibiotics: Fluconazole 7/2>>7/4 Micafungin 7/4>> Zosyn 7/2>>7/6  Subjective: Pt starting to vocalize more and starting to come back to herself.  She is tolerating clears, asking about advancing diet today.    Objective: Vitals:   06/09/22 0058 06/09/22 1443 06/09/22 2037 06/10/22 0558  BP: (!) 141/74 (!) 146/79 (!) 162/76 (!) 150/68  Pulse: (!) 101 98 82 62  Resp: '17  20 18  '$ Temp: 98.3 F (36.8 C) 98.6 F (37 C) 97.7 F (36.5 C) (!) 97.5 F (36.4 C)  TempSrc: Oral Oral Oral Oral  SpO2: 90%  94% 100%  Weight:      Height:        Intake/Output Summary (Last 24 hours) at 06/10/2022 1355 Last data filed at 06/10/2022 0900 Gross per 24 hour  Intake 200 ml  Output 2890 ml  Net -2690 ml   Weight change:  Exam:  General:  awake, vocalizing more, hard of hearing, NAD, cooperative. Some cognitive delay noted.  HEENT: No icterus, No thrush, No neck mass, [place]/AT Cardiovascular: normal s1,s2 sounds, no m/r/g heard. Respiratory: bilateral BS shallow but clear, no rales, rare exp wheezing heard. Abdomen: soft, nondistended, no masses palpated. Normal BS Extremities: no clubbing or cyanosis, trace pretibial edema.  Data Reviewed: I have personally reviewed following labs and imaging studies Basic Metabolic Panel: Recent Labs  Lab 06/04/22 0444 06/05/22 0357 06/06/22 0352 06/07/22 0412 06/08/22 0344 06/09/22 0503 06/10/22 0549  NA 137 131* 134* 137 140 144 146*  K 4.0 4.6 4.6 4.4 4.7 4.1 3.9  CL 108 103 105 108 111 112* 109  CO2 '23 22 24 24 23 26 29  '$ GLUCOSE 124* 187* 125* 125* 72 140* 112*  BUN 36* 48* 50* 43* 47* 54* 54*  CREATININE 1.64* 2.28* 2.05* 1.81* 1.82* 1.88* 1.97*  CALCIUM 8.3* 7.6* 7.8* 8.1* 8.4* 8.9 9.2  MG 1.3* 2.1 2.1  --  2.3 2.3 2.0  PHOS 3.5 4.5 4.5  --  4.8*  --   --    Liver Function Tests: Recent Labs  Lab 06/07/22 0412 06/08/22 0344 06/09/22 0503 06/09/22 0905 06/10/22 0549  AST '17 16  19 21 22  '$ ALT '14 13 14 14 14  '$ ALKPHOS 74 79 90 89 91  BILITOT 2.4* 2.7* 3.3* 3.2* 3.8*  PROT 4.1* 4.4* 4.6* 4.7* 4.7*  ALBUMIN <1.5* <1.5* <1.5* <1.5* <1.5*   Recent Labs  Lab 06/03/22 2133  LIPASE 24   No results for input(s): "AMMONIA" in the last 168 hours. Coagulation Profile: Recent Labs  Lab 06/03/22 2045  INR 1.3*   CBC: Recent Labs  Lab 06/04/22 0444 06/05/22 0357 06/06/22 0352 06/07/22 0412 06/08/22 0344 06/09/22 0503 06/10/22 0549  WBC 4.8   < > 24.0* 25.2* 23.0* 23.5* 25.6*  NEUTROABS 2.5  --  20.5*  --  17.9* 18.6* 20.8*  HGB 12.4   < > 10.9* 11.8* 11.7* 12.0 12.4  HCT 38.4   < > 33.7* 37.6 37.9 38.3 38.9  MCV 94.1   < > 94.7 94.5 95.5 93.9 93.5  PLT 249   < > 137* 121* 132* 122* 156   < > = values in this interval not displayed.   Cardiac Enzymes: No results for input(s): "CKTOTAL", "CKMB", "CKMBINDEX", "TROPONINI" in the last 168 hours. BNP: Invalid input(s): "POCBNP" CBG: Recent Labs  Lab 06/09/22 1118 06/09/22 1624 06/09/22 2057  06/10/22 0724 06/10/22 1149  GLUCAP 158* 154* 106* 76 70   HbA1C: No results for input(s): "HGBA1C" in the last 72 hours. Urine analysis:    Component Value Date/Time   COLORURINE AMBER (A) 06/04/2022 0520   APPEARANCEUR CLOUDY (A) 06/04/2022 0520   APPEARANCEUR Cloudy (A) 04/13/2021 1530   LABSPEC 1.027 06/04/2022 0520   PHURINE 5.0 06/04/2022 0520   GLUCOSEU NEGATIVE 06/04/2022 0520   HGBUR NEGATIVE 06/04/2022 0520   BILIRUBINUR NEGATIVE 06/04/2022 0520   BILIRUBINUR Negative 04/13/2021 1530   KETONESUR NEGATIVE 06/04/2022 0520   PROTEINUR 100 (A) 06/04/2022 0520   NITRITE NEGATIVE 06/04/2022 0520   LEUKOCYTESUR SMALL (A) 06/04/2022 0520    Recent Results (from the past 240 hour(s))  Cath Tip Culture     Status: None   Collection Time: 06/03/22  8:07 PM   Specimen: Catheter Tip  Result Value Ref Range Status   Specimen Description CATH TIP  Final   Special Requests NONE  Final   Culture   Final     NO GROWTH 3 DAYS Performed at [facility], [place] [address], [place], [place] [address]    Report Status 06/09/2022 FINAL  Final  Culture, blood (Routine x 2)     Status: Abnormal   Collection Time: 06/03/22  8:45 PM   Specimen: BLOOD  Result Value Ref Range Status   Specimen Description   Final    BLOOD LEFT ANTECUBITAL Performed at [facility], [place] [address] [place], [place] [address]    Special Requests   Final    BOTTLES DRAWN AEROBIC AND ANAEROBIC Blood Culture adequate volume Performed at [facility], [address], [place], [place] [address]    Culture  Setup Time   Final    YEAST BOTH Gram Stain Report Called to,Read Back By and Verified With: JACOBS, U'@0745'$  BY MATTHEWS, B 7.4.2023 CRITICAL RESULT CALLED TO, READ BACK BY AND VERIFIED WITH: PHARMD S.HURTZ AT 1130 ON 06/06/2022 BY T.SAAD.    Culture (A)  Final    CANDIDA GLABRATA Sent to [place] for further susceptibility testing. Performed at [facility], [place] [address] [place], [place] [address]    Report Status 06/09/2022 FINAL  Final  Blood Culture ID Panel (Reflexed)     Status: Abnormal   Collection Time: 06/03/22  8:45 PM  Result Value Ref Range Status   Enterococcus faecalis NOT DETECTED NOT DETECTED Final   Enterococcus Faecium NOT DETECTED NOT DETECTED Final   Listeria monocytogenes NOT DETECTED NOT DETECTED Final   Staphylococcus species NOT DETECTED NOT DETECTED Final   Staphylococcus aureus (BCID) NOT DETECTED NOT DETECTED Final   Staphylococcus epidermidis NOT DETECTED NOT DETECTED Final   Staphylococcus lugdunensis NOT DETECTED NOT DETECTED Final   Streptococcus species NOT DETECTED NOT DETECTED Final   Streptococcus agalactiae NOT DETECTED NOT DETECTED Final   Streptococcus pneumoniae NOT DETECTED NOT DETECTED Final   Streptococcus pyogenes NOT DETECTED NOT DETECTED Final   A.calcoaceticus-baumannii NOT DETECTED NOT DETECTED Final   Bacteroides fragilis NOT DETECTED NOT DETECTED  Final   Enterobacterales NOT DETECTED NOT DETECTED Final   Enterobacter cloacae complex NOT DETECTED NOT DETECTED Final   Escherichia coli NOT DETECTED NOT DETECTED Final   Klebsiella aerogenes NOT DETECTED NOT DETECTED Final   Klebsiella oxytoca NOT DETECTED NOT DETECTED Final   Klebsiella pneumoniae NOT DETECTED NOT DETECTED Final   Proteus species NOT DETECTED NOT DETECTED Final   Salmonella species NOT DETECTED NOT DETECTED Final  Serratia marcescens NOT DETECTED NOT DETECTED Final   Haemophilus influenzae NOT DETECTED NOT DETECTED Final   Neisseria meningitidis NOT DETECTED NOT DETECTED Final   Pseudomonas aeruginosa NOT DETECTED NOT DETECTED Final   Stenotrophomonas maltophilia NOT DETECTED NOT DETECTED Final   Candida albicans NOT DETECTED NOT DETECTED Final   Candida auris NOT DETECTED NOT DETECTED Final   Candida glabrata DETECTED (A) NOT DETECTED Final    Comment: CRITICAL RESULT CALLED TO, READ BACK BY AND VERIFIED WITH: PHARMD S.HURTZ AT 1130 ON 06/06/2022 BY T.SAAD.    Candida krusei NOT DETECTED NOT DETECTED Final   Candida parapsilosis NOT DETECTED NOT DETECTED Final   Candida tropicalis NOT DETECTED NOT DETECTED Final   Cryptococcus neoformans/gattii NOT DETECTED NOT DETECTED Final    Comment: Performed at [facility], [place] [address] [place], [place] [address]  Antifungal AST 9 Drug Panel     Status: None (Preliminary result)   Collection Time: 06/03/22  8:45 PM  Result Value Ref Range Status   Organism ID, Yeast Preliminary report  Final    Comment: (NOTE) Specimen has been received and testing has been initiated. Performed At: [facility] [place], [place] [id number] Rush Farmer MD NO:[id number]    Amphotericin B MIC PENDING  Incomplete   Anidulafungin MIC PENDING  Incomplete   Caspofungin MIC PENDING  Incomplete   Micafungin MIC PENDING  Incomplete   Posaconazole MIC PENDING  Incomplete   Fluconazole Islt MIC PENDING   Incomplete   Flucytosine MIC PENDING  Incomplete   Itraconazole MIC PENDING  Incomplete   Voriconazole MIC PENDING  Incomplete   Source [id number] [facility] BLD SENSI C GLABRATA  Final    Comment: Performed at [facility], [place] [address], [place], [place] [address]  Culture, blood (Routine x 2)     Status: Abnormal (Preliminary result)   Collection Time: 06/03/22  9:32 PM   Specimen: BLOOD  Result Value Ref Range Status   Specimen Description   Final    BLOOD LEFT ANTECUBITAL Performed at [facility], [place] [address] [place], [place] [address]    Special Requests   Final    BOTTLES DRAWN AEROBIC AND ANAEROBIC Blood Culture adequate volume Performed at [facility], [address] [place], [place] [address]    Culture  Setup Time   Final    AEROBIC BOTTLE ONLY YEAST Gram Stain Report Called to,Read Back By and Verified With: UTE JACOBS @ 1200 ON 06/06/22 C VARNER CRITICAL VALUE NOTED.  VALUE IS CONSISTENT WITH PREVIOUSLY REPORTED AND CALLED VALUE. Performed at [facility], [place] [address] [place], [place] [address]    Culture CANDIDA GLABRATA (A)  Final   Report Status PENDING  Incomplete  MRSA Next Gen by PCR, Nasal     Status: None   Collection Time: 06/04/22  2:18 AM   Specimen: Nasal Mucosa; Nasal Swab  Result Value Ref Range Status   MRSA by PCR Next Gen NOT DETECTED NOT DETECTED Final    Comment: (NOTE) The GeneXpert MRSA Assay (FDA approved for NASAL specimens only), is one component of a comprehensive MRSA colonization surveillance program. It is not intended to diagnose MRSA infection nor to guide or monitor treatment for MRSA infections. Test performance is not FDA approved in patients less than 26 years old. Performed at [facility], [address] [place], [place] [address]   Urine Culture     Status: Abnormal   Collection Time: 06/04/22  2:28 AM   Specimen: PATH Cytology Urine  Result Value Ref Range Status   Specimen Description   Final    URINE,  CLEAN CATCH Performed at [facility], [address] [place], [place] [address]    Special Requests   Final    NONE Performed at [facility], [address], [place], [place] [address]    Culture (A)  Final    <10,000 COLONIES/mL INSIGNIFICANT GROWTH Performed at [place] [address], [place], [place] [address]    Report Status 06/05/2022 FINAL  Final  Aerobic/Anaerobic Culture w Gram Stain (surgical/deep wound)     Status: None   Collection Time: 06/04/22  3:03 AM   Specimen: PATH Cytology FNA; Body Fluid  Result Value Ref Range Status   Specimen Description   Final    ANAL Performed at [facility], [address], [place], [place] [address]    Special Requests   Final    NONE Performed at [facility], [address], [place], [place] [address]    Gram Stain   Final    MODERATE WBC PRESENT,BOTH PMN AND MONONUCLEAR FEW GRAM POSITIVE COCCI IN PAIRS MODERATE GRAM POSITIVE RODS    Culture   Final    FEW CANDIDA ALBICANS NO ANAEROBES ISOLATED Performed at [facility], [place] [address] [place], [place] [address]    Report Status 06/09/2022 FINAL  Final  Culture, blood (Routine X 2) w Reflex to ID Panel     Status: None (Preliminary result)   Collection Time: 06/06/22  9:32 AM   Specimen: BLOOD LEFT WRIST  Result Value Ref Range Status   Specimen Description BLOOD LEFT WRIST  Final   Special Requests   Final    BOTTLES DRAWN AEROBIC ONLY Blood Culture results may not be optimal due to an inadequate volume of blood received in culture bottles   Culture   Final    NO GROWTH 4 DAYS Performed at [facility], [address], [place], [place] [address]    Report Status PENDING  Incomplete  Culture, blood (Routine X 2) w Reflex to ID Panel     Status: None (Preliminary result)   Collection Time: 06/06/22  9:33 AM   Specimen: Left Antecubital; Blood  Result Value Ref Range Status   Specimen Description LEFT ANTECUBITAL  Final   Special Requests   Final     BOTTLES DRAWN AEROBIC AND ANAEROBIC Blood Culture adequate volume   Culture   Final    NO GROWTH 4 DAYS Performed at [facility], [address], [place], [place] [address]    Report Status PENDING  Incomplete     Scheduled Meds:  amLODipine  2.5 mg Oral Daily   Chlorhexidine Gluconate Cloth  6 each Topical Daily   heparin  5,000 Units Subcutaneous Q8H   insulin aspart  0-9 Units Subcutaneous TID WC   mouth rinse  15 mL Mouth Rinse Q4H   pantoprazole (PROTONIX) IV  40 mg Intravenous Q12H   Continuous Infusions:  micafungin (MYCAMINE) 100 mg in sodium chloride 0.9 % 100 mL IVPB 100 mg (06/10/22 1257)    Procedures/Studies: DG CHEST PORT 1 VIEW  Result Date: 06/09/2022 CLINICAL DATA:  Weakness, wheezing EXAM: PORTABLE CHEST 1 VIEW COMPARISON:  06/05/2022 FINDINGS: Interval endotracheal and esophagogastric extubation. Small to moderate bilateral pleural effusions, increased compared to prior examination, with associated atelectasis or consolidation and diffuse bilateral interstitial pulmonary opacity. Cardiomegaly. IMPRESSION: 1. Small to moderate bilateral pleural effusions,  increased compared to prior examination, with associated atelectasis or consolidation and diffuse bilateral interstitial pulmonary opacity. Findings are most consistent with worsened edema. 2. Interval endotracheal and esophagogastric extubation. 3. Cardiomegaly. Electronically Signed   By: Delanna Ahmadi M.D.   On: 06/09/2022 12:25   DG UGI W SMALL BOWEL  Result Date: 06/08/2022 CLINICAL DATA:  Post repair of 1.5 cm diameter perforated anterior wall gastric antral ulcer EXAM: WATER SOLUBLE UPPER GI SERIES TECHNIQUE: Single-column upper GI series was performed using water soluble contrast. Radiation Exposure Index (as provided by the fluoroscopic device): 43.1 mGy Kerma CONTRAST:  100 cc Omnipaque 300 via NG tube COMPARISON:  CT abdomen and pelvis 06/03/2022 FLUOROSCOPY: Fluoroscopy Time:  2 minutes 36 seconds Radiation  Exposure Index (if provided by the fluoroscopic device): 43.1 Number of Acquired Spot Images: Multiple fluoroscopic screen captures and cine series FINDINGS: Contrast administered via NG tube opacifies the gastric lumen. Patent pylorus with passage of contrast through normal appearing duodenal C loop into proximal jejunum with incidentally noted duodenal diverticulum at second portion. Irregularity of rugal folds with thickening and distortion at anterior wall of gastric antrum at site of perforation repair. The third portion of the duodenum is superimposed with the inferior wall of the gastric antrum, mildly limiting exam. A blush of contrast is seen inferior to the antrum, superimposed with the third portion of the duodenum throughout the study. This appears to move in concert with the duodenum with respiration. No contrast opacification of the JP drain. No gross evidence of contrast leak identified though it is difficult to completely exclude a small leak at the site of repair. IMPRESSION: Distortion and thickening of rugal folds at site of antral gastric ulcer repair. No gross evidence of leak at the site of ulcer repair as discussed above. Electronically Signed   By: Lavonia Dana M.D.   On: 06/08/2022 15:32   ECHOCARDIOGRAM COMPLETE  Result Date: 06/07/2022    ECHOCARDIOGRAM REPORT   Patient Name:   Carolyn Erickson Date of Exam: 06/07/2022 Medical Rec #:  [id number]       Height:       61.0 in Accession #:    [id number]      Weight:       112.2 lb Date of Birth:  09-04-1946        BSA:          1.477 m Patient Age:    82 years        BP:           116/46 mmHg Patient Gender: F               HR:           104 bpm. Exam Location:  [facility] Procedure: 2D Echo, Cardiac Doppler and Color Doppler Indications:    Bacteremia  History:        Patient has no prior history of Echocardiogram examinations.                 Signs/Symptoms:Bacteremia; Risk Factors:Hypertension and                 Dyslipidemia. Breast CA.   Sonographer:    Wenda Low Referring Phys: [id number] [place]  1. Abnormal septal motion . Left ventricular ejection fraction, by estimation, is 55 to 60%. The left ventricle has normal function. The left ventricle has no regional wall motion abnormalities. Left ventricular diastolic parameters were normal.  2. Right ventricular systolic function  is normal. The right ventricular size is normal. There is mildly elevated pulmonary artery systolic pressure.  3. Moderate pleural effusion in the left lateral region.  4. The mitral valve is abnormal. Trivial mitral valve regurgitation. No evidence of mitral stenosis.  5. Thickening and calcification noted . The tricuspid valve is abnormal.  6. Some thickening in the LVOT near intervalvular fibrosa Given this and nodular calcification of PV suggest TEE if suspicion for SBE high. The aortic valve is tricuspid. There is moderate calcification of the aortic valve. There is moderate thickening of the aortic valve. Aortic valve regurgitation is mild. Aortic valve sclerosis/calcification is present, without any evidence of aortic stenosis.  7. Nodular calcification seen on PV. The pulmonic valve was abnormal.  8. The inferior vena cava is normal in size with greater than 50% respiratory variability, suggesting right atrial pressure of 3 mmHg. FINDINGS  Left Ventricle: Abnormal septal motion. Left ventricular ejection fraction, by estimation, is 55 to 60%. The left ventricle has normal function. The left ventricle has no regional wall motion abnormalities. The left ventricular internal cavity size was normal in size. There is no left ventricular hypertrophy. Left ventricular diastolic parameters were normal. Right Ventricle: The right ventricular size is normal. No increase in right ventricular wall thickness. Right ventricular systolic function is normal. There is mildly elevated pulmonary artery systolic pressure. The tricuspid regurgitant velocity is 3.21   m/s, and with an assumed right atrial pressure of 3 mmHg, the estimated right ventricular systolic pressure is 31.5 mmHg. Left Atrium: Left atrial size was normal in size. Right Atrium: Right atrial size was normal in size. Pericardium: There is no evidence of pericardial effusion. Mitral Valve: The mitral valve is abnormal. There is mild thickening of the mitral valve leaflet(s). There is mild calcification of the mitral valve leaflet(s). Mild mitral annular calcification. Trivial mitral valve regurgitation. No evidence of mitral valve stenosis. MV peak gradient, 3.9 mmHg. The mean mitral valve gradient is 1.0 mmHg. Tricuspid Valve: Thickening and calcification noted. The tricuspid valve is abnormal. Tricuspid valve regurgitation is trivial. No evidence of tricuspid stenosis. Aortic Valve: Some thickening in the LVOT near intervalvular fibrosa Given this and nodular calcification of PV suggest TEE if suspicion for SBE high. The aortic valve is tricuspid. There is moderate calcification of the aortic valve. There is moderate thickening of the aortic valve. Aortic valve regurgitation is mild. Aortic valve sclerosis/calcification is present, without any evidence of aortic stenosis. Aortic valve mean gradient measures 5.0 mmHg. Aortic valve peak gradient measures 10.3 mmHg. Aortic valve area, by VTI measures 2.02 cm. Pulmonic Valve: Nodular calcification seen on PV. The pulmonic valve was abnormal. Pulmonic valve regurgitation is trivial. No evidence of pulmonic stenosis. Aorta: The aortic root is normal in size and structure. Venous: The inferior vena cava is normal in size with greater than 50% respiratory variability, suggesting right atrial pressure of 3 mmHg. IAS/Shunts: No atrial level shunt detected by color flow Doppler. Additional Comments: There is a moderate pleural effusion in the left lateral region.  LEFT VENTRICLE PLAX 2D LVIDd:         4.50 cm     Diastology LVIDs:         3.00 cm     LV e' medial:     7.29 cm/s LV PW:         0.80 cm     LV E/e' medial:  11.8 LV IVS:        0.80 cm  LV e' lateral:   9.79 cm/s LVOT diam:     1.80 cm     LV E/e' lateral: 8.8 LV SV:         55 LV SV Index:   37 LVOT Area:     2.54 cm  LV Volumes (MOD) LV vol d, MOD A2C: 43.7 ml LV vol d, MOD A4C: 52.7 ml LV vol s, MOD A2C: 17.3 ml LV vol s, MOD A4C: 23.8 ml LV SV MOD A2C:     26.4 ml LV SV MOD A4C:     52.7 ml LV SV MOD BP:      28.6 ml RIGHT VENTRICLE RV Basal diam:  3.10 cm RV Mid diam:    2.90 cm RV S prime:     14.50 cm/s TAPSE (M-mode): 3.0 cm LEFT ATRIUM             Index        RIGHT ATRIUM           Index LA diam:        3.60 cm 2.44 cm/m   RA Area:     11.50 cm LA Vol (A2C):   50.9 ml 34.45 ml/m  RA Volume:   21.80 ml  14.76 ml/m LA Vol (A4C):   43.7 ml 29.58 ml/m LA Biplane Vol: 49.8 ml 33.71 ml/m  AORTIC VALVE                     PULMONIC VALVE AV Area (Vmax):    1.92 cm      PV Vmax:       0.98 m/s AV Area (Vmean):   1.84 cm      PV Peak grad:  3.9 mmHg AV Area (VTI):     2.02 cm AV Vmax:           160.50 cm/s AV Vmean:          103.550 cm/s AV VTI:            0.274 m AV Peak Grad:      10.3 mmHg AV Mean Grad:      5.0 mmHg LVOT Vmax:         121.00 cm/s LVOT Vmean:        74.800 cm/s LVOT VTI:          0.218 m LVOT/AV VTI ratio: 0.79  AORTA Ao Root diam: 3.30 cm MITRAL VALVE               TRICUSPID VALVE MV Area (PHT): 6.02 cm    TR Peak grad:   41.2 mmHg MV Area VTI:   2.25 cm    TR Vmax:        321.00 cm/s MV Peak grad:  3.9 mmHg MV Mean grad:  1.0 mmHg    SHUNTS MV Vmax:       0.99 m/s    Systemic VTI:  0.22 m MV Vmean:      48.8 cm/s   Systemic Diam: 1.80 cm MV Decel Time: 126 msec MV E velocity: 86.00 cm/s MV A velocity: 72.70 cm/s MV E/A ratio:  1.18 Jenkins Rouge MD Electronically signed by Jenkins Rouge MD Signature Date/Time: 06/07/2022/10:28:36 AM    Final    DG CHEST PORT 1 VIEW  Result Date: 06/05/2022 CLINICAL DATA:  76 year old female with respiratory failure. Intubated. EXAM: PORTABLE CHEST  1 VIEW COMPARISON:  Portable chest 06/04/2022 and earlier. FINDINGS: Portable AP semi upright view at 0527 hours.  Endotracheal tube tip in good position between the clavicles and carina. Stable left IJ central line and visible enteric tube. Increased left lung base and retrocardiac opacity now mostly obscuring the left hemidiaphragm. Slightly lower lung volumes overall. Mediastinal contours remain normal. No pneumothorax, pulmonary edema, or definite effusion. Negative right lung. Partially visible midline abdominal skin staples. Paucity of bowel gas in the upper abdomen. Stable left chest and axillary surgical clips. IMPRESSION: 1. Stable lines and tubes. 2. Increased left lower lobe collapse or consolidation since yesterday. Electronically Signed   By: Genevie Ann M.D.   On: 06/05/2022 08:26   DG Chest Port 1 View  Result Date: 06/04/2022 CLINICAL DATA:  Endotracheal tube, nasogastric tube and central line placement. EXAM: PORTABLE CHEST 1 VIEW COMPARISON:  June 03, 2022 FINDINGS: Since the prior study there has been interval placement of an endotracheal tube. Its distal tip is seen approximately 3.8 cm from the carina. Interval nasogastric tube placement is also seen. Its distal and extends into the gastric lumen. There is a left internal jugular venous catheter with its distal tip overlying the distal superior vena cava. This is approximately 6 mm proximal to the junction of the superior vena cava and right atrium. The heart size and mediastinal contours are within normal limits. Mild to moderate severity atelectasis and/or infiltrate is seen within the left lung base. This represents a new finding when compared to the prior study. There is no evidence of a pleural effusion or pneumothorax. Radiopaque surgical clips are seen along the left axilla. The air seen below the right hemidiaphragm on the prior study is no longer visualized. The visualized skeletal structures are unremarkable. IMPRESSION: 1. Interval  endotracheal tube, nasogastric tube and left internal jugular venous catheter placement positioning, as described above. 2. Mild to moderate severity left basilar atelectasis and/or infiltrate. Electronically Signed   By: Virgina Norfolk M.D.   On: 06/04/2022 02:47   CT Abdomen Pelvis Wo Contrast  Result Date: 06/03/2022 CLINICAL DATA:  Severe weakness for 2 days with acute abdominal pain, initial encounter EXAM: CT ABDOMEN AND PELVIS WITHOUT CONTRAST TECHNIQUE: Multidetector CT imaging of the abdomen and pelvis was performed following the standard protocol without IV contrast. RADIATION DOSE REDUCTION: This exam was performed according to the departmental dose-optimization program which includes automated exposure control, adjustment of the mA and/or kV according to patient size and/or use of iterative reconstruction technique. COMPARISON:  None Available. FINDINGS: Lower chest: Basilar atelectatic changes are noted. Hepatobiliary: Liver is within normal limits. Gallbladder demonstrates a normal appearance. Pancreas: Unremarkable. No pancreatic ductal dilatation or surrounding inflammatory changes. Spleen: Normal in size without focal abnormality. Adrenals/Urinary Tract: Adrenal glands are within normal limits. Kidneys demonstrate tiny nonobstructing stones on right in the lower pole. No ureteral obstruction is seen. The bladder is decompressed. Stomach/Bowel: Considerable fecal material is noted within the rectal vault which may represent a focal impaction. Diverticular change of the colon is noted as well as some generalized wall thickening throughout the colon. The appendix is within normal limits. Small bowel is within normal limits. Stomach demonstrates a defect in the anterior gastric wall consistent with a perforated ulcer. There is a considerable amount of air and fluid throughout the abdomen with apparent direct communication with the gastric best seen on image number 36 of series 2 and image number  78 of series 6. Vascular/Lymphatic: Aortic atherosclerosis. No enlarged abdominal or pelvic lymph nodes. Reproductive: Status post hysterectomy. No adnexal masses. Other: Free air and free fluid similar to  that described above throughout the abdomen. The bowel demonstrates some reactive wall thickening related to the fluid and air. Musculoskeletal: Degenerative changes of the lumbar spine are seen. No other bony abnormality is noted. IMPRESSION: Changes consistent with a perforated gastric ulcer anteriorly with considerable spillage of fluid and air into the abdominal cavity. Some reactive wall thickening is noted within the bowel loops secondary to these changes. Critical Value/emergent results were called by telephone at the time of interpretation on 06/03/2022 at 10:43 pm to Dr. Aletta Edouard , who verbally acknowledged these results. Electronically Signed   By: Inez Catalina M.D.   On: 06/03/2022 22:45   DG Chest Port 1 View  Result Date: 06/03/2022 CLINICAL DATA:  Weakness. EXAM: PORTABLE CHEST 1 VIEW COMPARISON:  None Available. FINDINGS: Multiple overlying radiopaque cardiac lead wires are seen. The heart size and mediastinal contours are within normal limits. Mild, diffuse, chronic appearing increased lung markings are seen. There is no evidence of an acute infiltrate, pleural effusion or pneumothorax. Radiopaque surgical clips are seen along the lateral aspect of the left chest wall. A crescentic area of air is seen just below the right hemidiaphragm. Degenerative changes seen throughout the thoracic spine. IMPRESSION: 1. Chronic appearing increased lung markings without evidence of acute or active cardiopulmonary disease. 2. Air seen just below the right hemidiaphragm which may represent an air filled loop of colon. Further evaluation with abdomen pelvis CT is recommended, as the presence of intra-abdominal free air cannot be excluded. Electronically Signed   By: Virgina Norfolk M.D.   On: 06/03/2022  21:22    Time spent: 35 mins   Jionni Helming Wynetta Emery, MD How to contact the [facility] Attending or Consulting provider [place] or covering provider during after hours [place], for this patient?  Check the care team in [facility] and look for a) attending/consulting [facility] provider listed and b) the [facility] team listed Log into [email or web address] and use [place]'s universal password to access. If you do not have the password, please contact the hospital operator. Locate the [facility] provider you are looking for under [facility] and page to a number that you can be directly reached. If you still have difficulty reaching the provider, please page the [facility] (Director on Call) for the Hospitalists listed on amion for assistance.   [facility]  If 7PM-7AM, please contact night-coverage [email or web address] Password [email or web address] 06/10/2022, 1:55 PM   LOS: 6 days

## 2022-06-10 NOTE — Assessment & Plan Note (Addendum)
-  Continue Lipitor °

## 2022-06-10 NOTE — Progress Notes (Signed)
6 Days Post-Op  Subjective: Patient resting comfortably.  She is arousable.  Objective: Vital signs in last 24 hours: Temp:  [97.5 F (36.4 C)-98.6 F (37 C)] 97.5 F (36.4 C) (07/08 0558) Pulse Rate:  [62-98] 62 (07/08 0558) Resp:  [18-20] 18 (07/08 0558) BP: (146-162)/(68-79) 150/68 (07/08 0558) SpO2:  [94 %-100 %] 100 % (07/08 0558) Last BM Date : 06/09/22  Intake/Output from previous day: 07/07 0701 - 07/08 0700 In: 120 [P.O.:120] Out: 2115 [Urine:2050; Drains:65] Intake/Output this shift: No intake/output data recorded.  General appearance: alert, cooperative, and no distress GI: Soft, incision healing well.  JP drainage serosanguineous in nature.  The bulb had already been drained, thus it was difficult to a certain whether any bile was present.  Lab Results:  Recent Labs    06/09/22 0503 06/10/22 0549  WBC 23.5* 25.6*  HGB 12.0 12.4  HCT 38.3 38.9  PLT 122* 156   BMET Recent Labs    06/09/22 0503 06/10/22 0549  NA 144 146*  K 4.1 3.9  CL 112* 109  CO2 26 29  GLUCOSE 140* 112*  BUN 54* 54*  CREATININE 1.88* 1.97*  CALCIUM 8.9 9.2   PT/INR No results for input(s): "LABPROT", "INR" in the last 72 hours.  Studies/Results: DG CHEST PORT 1 VIEW  Result Date: 06/09/2022 CLINICAL DATA:  Weakness, wheezing EXAM: PORTABLE CHEST 1 VIEW COMPARISON:  06/05/2022 FINDINGS: Interval endotracheal and esophagogastric extubation. Small to moderate bilateral pleural effusions, increased compared to prior examination, with associated atelectasis or consolidation and diffuse bilateral interstitial pulmonary opacity. Cardiomegaly. IMPRESSION: 1. Small to moderate bilateral pleural effusions, increased compared to prior examination, with associated atelectasis or consolidation and diffuse bilateral interstitial pulmonary opacity. Findings are most consistent with worsened edema. 2. Interval endotracheal and esophagogastric extubation. 3. Cardiomegaly. Electronically Signed   By:  Delanna Ahmadi M.D.   On: 06/09/2022 12:25   DG UGI W SMALL BOWEL  Result Date: 06/08/2022 CLINICAL DATA:  Post repair of 1.5 cm diameter perforated anterior wall gastric antral ulcer EXAM: WATER SOLUBLE UPPER GI SERIES TECHNIQUE: Single-column upper GI series was performed using water soluble contrast. Radiation Exposure Index (as provided by the fluoroscopic device): 43.1 mGy Kerma CONTRAST:  100 cc Omnipaque 300 via NG tube COMPARISON:  CT abdomen and pelvis 06/03/2022 FLUOROSCOPY: Fluoroscopy Time:  2 minutes 36 seconds Radiation Exposure Index (if provided by the fluoroscopic device): 43.1 Number of Acquired Spot Images: Multiple fluoroscopic screen captures and cine series FINDINGS: Contrast administered via NG tube opacifies the gastric lumen. Patent pylorus with passage of contrast through normal appearing duodenal C loop into proximal jejunum with incidentally noted duodenal diverticulum at second portion. Irregularity of rugal folds with thickening and distortion at anterior wall of gastric antrum at site of perforation repair. The third portion of the duodenum is superimposed with the inferior wall of the gastric antrum, mildly limiting exam. A blush of contrast is seen inferior to the antrum, superimposed with the third portion of the duodenum throughout the study. This appears to move in concert with the duodenum with respiration. No contrast opacification of the JP drain. No gross evidence of contrast leak identified though it is difficult to completely exclude a small leak at the site of repair. IMPRESSION: Distortion and thickening of rugal folds at site of antral gastric ulcer repair. No gross evidence of leak at the site of ulcer repair as discussed above. Electronically Signed   By: Lavonia Dana M.D.   On: 06/08/2022 15:32  Anti-infectives: Anti-infectives (From admission, onward)    Start     Dose/Rate Route Frequency Ordered Stop   06/07/22 0200  fluconazole (DIFLUCAN) IVPB 200 mg   Status:  Discontinued        200 mg 100 mL/hr over 60 Minutes Intravenous Every 24 hours 06/06/22 0901 06/06/22 1031   06/06/22 1200  micafungin (MYCAMINE) 100 mg in sodium chloride 0.9 % 100 mL IVPB        100 mg 105 mL/hr over 1 Hours Intravenous Every 24 hours 06/06/22 1031     06/05/22 1800  piperacillin-tazobactam (ZOSYN) IVPB 3.375 g        3.375 g 12.5 mL/hr over 240 Minutes Intravenous Every 12 hours 06/05/22 0825 06/09/22 2000   06/04/22 0600  piperacillin-tazobactam (ZOSYN) IVPB 3.375 g  Status:  Discontinued        3.375 g 12.5 mL/hr over 240 Minutes Intravenous Every 8 hours 06/04/22 0219 06/05/22 0825   06/04/22 0315  fluconazole (DIFLUCAN) IVPB 200 mg  Status:  Discontinued        200 mg 100 mL/hr over 60 Minutes Intravenous Every 24 hours 06/04/22 0217 06/04/22 0219   06/04/22 0315  fluconazole (DIFLUCAN) IVPB 200 mg  Status:  Discontinued        200 mg 100 mL/hr over 60 Minutes Intravenous Every 24 hours 06/04/22 0219 06/06/22 0901   06/03/22 2130  ceFEPIme (MAXIPIME) 2 g in sodium chloride 0.9 % 100 mL IVPB        2 g 200 mL/hr over 30 Minutes Intravenous  Once 06/03/22 2127 06/03/22 2209   06/03/22 2130  metroNIDAZOLE (FLAGYL) IVPB 500 mg  Status:  Discontinued        500 mg 100 mL/hr over 60 Minutes Intravenous Every 12 hours 06/03/22 2127 06/04/22 0217       Assessment/Plan: s/p Procedure(s): EXPLORATORY LAPAROTOMY, omental patch Impression: Status post [place] plication.  Her leukocytosis persists.  She does have a fungemia which is being addressed.  Total bilirubin is up to 3.8, direct pending.  Etiology is unknown.  JP drainage has been stable.  We will continue to monitor.  Should her leukocytosis continued to worsen, we will reimage to rule out leak.  LOS: 6 days    Aviva Signs 06/10/2022

## 2022-06-11 ENCOUNTER — Inpatient Hospital Stay (HOSPITAL_COMMUNITY): Payer: Medicare PPO

## 2022-06-11 ENCOUNTER — Inpatient Hospital Stay: Payer: Self-pay

## 2022-06-11 DIAGNOSIS — D72829 Elevated white blood cell count, unspecified: Secondary | ICD-10-CM | POA: Diagnosis present

## 2022-06-11 DIAGNOSIS — A419 Sepsis, unspecified organism: Secondary | ICD-10-CM | POA: Diagnosis not present

## 2022-06-11 DIAGNOSIS — N179 Acute kidney failure, unspecified: Secondary | ICD-10-CM | POA: Diagnosis not present

## 2022-06-11 DIAGNOSIS — K255 Chronic or unspecified gastric ulcer with perforation: Secondary | ICD-10-CM | POA: Diagnosis not present

## 2022-06-11 DIAGNOSIS — B49 Unspecified mycosis: Secondary | ICD-10-CM | POA: Diagnosis not present

## 2022-06-11 LAB — CULTURE, BLOOD (ROUTINE X 2)
Culture: NO GROWTH
Culture: NO GROWTH
Special Requests: ADEQUATE
Special Requests: ADEQUATE

## 2022-06-11 LAB — BASIC METABOLIC PANEL
Anion gap: 8 (ref 5–15)
BUN: 51 mg/dL — ABNORMAL HIGH (ref 8–23)
CO2: 30 mmol/L (ref 22–32)
Calcium: 9.6 mg/dL (ref 8.9–10.3)
Chloride: 107 mmol/L (ref 98–111)
Creatinine, Ser: 1.66 mg/dL — ABNORMAL HIGH (ref 0.44–1.00)
GFR, Estimated: 31 mL/min — ABNORMAL LOW (ref >60)
Glucose, Bld: 118 mg/dL — ABNORMAL HIGH (ref 70–99)
Potassium: 3.3 mmol/L — ABNORMAL LOW (ref 3.5–5.1)
Sodium: 145 mmol/L (ref 135–145)

## 2022-06-11 LAB — HEPATIC FUNCTION PANEL
ALT: 18 U/L (ref 0–44)
AST: 30 U/L (ref 15–41)
Albumin: 1.8 g/dL — ABNORMAL LOW (ref 3.5–5.0)
Alkaline Phosphatase: 122 U/L (ref 38–126)
Bilirubin, Direct: 3.2 mg/dL — ABNORMAL HIGH (ref 0.0–0.2)
Indirect Bilirubin: 1.6 mg/dL — ABNORMAL HIGH (ref 0.3–0.9)
Total Bilirubin: 4.8 mg/dL — ABNORMAL HIGH (ref 0.3–1.2)
Total Protein: 5.6 g/dL — ABNORMAL LOW (ref 6.5–8.1)

## 2022-06-11 LAB — CBC WITH DIFFERENTIAL/PLATELET
Abs Immature Granulocytes: 0.77 10*3/uL — ABNORMAL HIGH (ref 0.00–0.07)
Basophils Absolute: 0.1 10*3/uL (ref 0.0–0.1)
Basophils Relative: 0 %
Eosinophils Absolute: 0.3 10*3/uL (ref 0.0–0.5)
Eosinophils Relative: 1 %
HCT: 41.4 % (ref 36.0–46.0)
Hemoglobin: 13.4 g/dL (ref 12.0–15.0)
Immature Granulocytes: 3 %
Lymphocytes Relative: 9 %
Lymphs Abs: 2.8 10*3/uL (ref 0.7–4.0)
MCH: 29.7 pg (ref 26.0–34.0)
MCHC: 32.4 g/dL (ref 30.0–36.0)
MCV: 91.8 fL (ref 80.0–100.0)
Monocytes Absolute: 1.1 10*3/uL — ABNORMAL HIGH (ref 0.1–1.0)
Monocytes Relative: 4 %
Neutro Abs: 25.8 10*3/uL — ABNORMAL HIGH (ref 1.7–7.7)
Neutrophils Relative %: 83 %
Platelets: 228 10*3/uL (ref 150–400)
RBC: 4.51 MIL/uL (ref 3.87–5.11)
RDW: 13.6 % (ref 11.5–15.5)
WBC: 31 10*3/uL — ABNORMAL HIGH (ref 4.0–10.5)
nRBC: 0.1 % (ref 0.0–0.2)

## 2022-06-11 LAB — ANTIFUNGAL AST 9 DRUG PANEL
Amphotericin B MIC: 1
Fluconazole Islt MIC: 16
Flucytosine MIC: 0.06
Itraconazole MIC: 1
Posaconazole MIC: 2
Source: 183119
Voriconazole MIC: 0.5

## 2022-06-11 LAB — GLUCOSE, CAPILLARY
Glucose-Capillary: 117 mg/dL — ABNORMAL HIGH (ref 70–99)
Glucose-Capillary: 102 mg/dL — ABNORMAL HIGH (ref 70–99)
Glucose-Capillary: 117 mg/dL — ABNORMAL HIGH (ref 70–99)
Glucose-Capillary: 149 mg/dL — ABNORMAL HIGH (ref 70–99)

## 2022-06-11 MED ORDER — IOHEXOL 9 MG/ML PO SOLN
500.0000 mL | ORAL | Status: AC
  Administered 2022-06-11 (×2): 500 mL via ORAL

## 2022-06-11 MED ORDER — PIPERACILLIN-TAZOBACTAM 3.375 G IVPB
3.3750 g | Freq: Once | INTRAVENOUS | Status: AC
  Administered 2022-06-11: 3.375 g via INTRAVENOUS
  Filled 2022-06-11: qty 50

## 2022-06-11 MED ORDER — PIPERACILLIN-TAZOBACTAM 3.375 G IVPB
3.3750 g | Freq: Three times a day (TID) | INTRAVENOUS | Status: DC
  Administered 2022-06-11 – 2022-06-13 (×5): 3.375 g via INTRAVENOUS
  Filled 2022-06-11 (×5): qty 50

## 2022-06-11 MED ORDER — KCL IN DEXTROSE-NACL 10-5-0.45 MEQ/L-%-% IV SOLN
INTRAVENOUS | Status: AC
  Filled 2022-06-11 (×4): qty 1000

## 2022-06-11 NOTE — Progress Notes (Addendum)
PROGRESS NOTE  Carolyn Erickson FAO:[id number] DOB: 11-28-1946 DOA: 06/03/2022 PCP: Dettinger, Fransisca Kaufmann, MD  Brief History:  76 year old female with history of major neurocognitive disorder, hypertension, hyperlipidemia, depression, left breast cancer presenting with generalized weakness and abdominal pain.  Apparently, the patient has been having abdominal pain since April 2023.  The patient had been started on pantoprazole at that time.  There was no improvement.  She followed up with her PCP on 06/02/2022.  Referral was given to see gastroenterology.  Abdominal pain had worsened over the past 2 days prior to admission with worsening generalized weakness.  There is no fevers, chills, chest pain, shortness breath, vomiting, diarrhea.  There is no hematochezia or melena noted.  In the emergency department, the patient was hypotensive with a blood pressure of 71/54.  She was somnolent.  CT of the abdomen and pelvis was performed and showed changes consistent with a perforated gastric ulcer.  Dr. Constance Haw was consulted, and the patient was taken to the OR for exploratory laparotomy, gastrorrhaphy, omental patch.  A left IJ central line was also placed.  TRH is consulted for medical management  In the ED, the patient had low-grade fever 99.5 F.  She was tachycardic and hypotensive with blood pressure 68/53.  WBC 4.1, hemoglobin 14.6, platelets 308,000.  BMP showed sodium 137, potassium 4.1, bicarbonate 22, serum creatinine 2.14.  Postoperatively, the patient was started on Zosyn and fluconazole.  She remained on the ventilator.  Preoperative chest x-ray showed bibasilar atelectasis.  There was free air under the diaphragm.   Assessment and Plan: * Septic shock - RESOLVED Presented with hypotension, tachycardia, and lactic acid peaked at 6.8 Pt successfully weaned off norepinephrine infusion Secondary to gastric perforation with peritonitis 06/03/22 blood cultures--C. glabrata UA 11-20 WBC>>urine  culture unrevealing Completed Zosyn 7/6  discontinued fluconazole per ID, continue echinocandin (micafungin)  Leukocytosis -- WBC rising today, agree with surgery to getting a CT abdomen with contrast -- repeat labs tomorrow  Hyperbilirubinemia -- rising; discussed with surgery, planning to get CT abdomen today with contrast   Volume overload -- treated with IV lasix 40 mg x 2 on 7/7 and resolved  Fungemia 06/03/22 blood cultures--Candida glabrata -d/c fluconazole per ID consult -continue echinocandin -discussed with general surgery>>removed central line 7/4 for line holiday -will need at least 24 hours central line holiday -repeat blood culture on 7/4 with no growth to date   Hypomagnesemia Repleted  Acute respiratory failure with hypoxia (HCC) Currently PRVC, RR 20, Vt 380, FiO2 0.4 -7/3 personally reviewed CXR--increased interstitial marking -7/3 ABG--7.35/36/107/20 (0.4) -7/3--extubated -7/4-5--stable on 4L [place] -7/6 - down to 2L/[place]  Major neurocognitive disorder [facility]) Patient had MoCa 17/30 on 03/14/22 -Restart Aricept once stable and clinically -At risk for hospital delirium but family remains close at bedside which is helping a lot  Acute renal failure superimposed on stage 3a chronic kidney disease ([place]) -Serum creatinine peaking 2.28 but trending down  -Secondary to hemodynamic changes/sepsis and hypovolemia -Monitor BMPs  GERD (gastroesophageal reflux disease) Continue pantoprazole IV twice daily  Gastric perforation ([place]) Suspect this may be due to chronic NSAID use -Continue PPI twice daily -06/04/2022--ex laparotomy with omental patch -Postoperative care per Dr. Constance Haw -TNA therapy on hold temporarily due to need for CVL holiday -UGI 7/6 - no leak seen.  - clears started by surgery team 7/6  Essential hypertension Resumed amlodipine 2.5 mg daily  Mixed hyperlipidemia -- holding home atorvastatin  Family Communication:   Family at bedside 7/4,  7/5, 7/6, 7/7, 7/8, 7/9  Consultants:  general surgery  Code Status:  FULL   DVT Prophylaxis:  SCDs  Procedures: As Listed in Progress Note Above  Antibiotics: Fluconazole 7/2>>7/4 Micafungin 7/4>> Zosyn 7/2>>7/6  Subjective: Pt more somnolent today seen after pain medication   Objective: Vitals:   06/10/22 1403 06/10/22 2206 06/11/22 0627 06/11/22 0628  BP: (!) 158/79 116/62  137/73  Pulse: 81 (!) 101  76  Resp: '20 20  14  '$ Temp: 97.9 F (36.6 C) 98.2 F (36.8 C)  98.2 F (36.8 C)  TempSrc: Axillary Oral  Oral  SpO2:  95%  95%  Weight:   54.9 kg   Height:        Intake/Output Summary (Last 24 hours) at 06/11/2022 1223 Last data filed at 06/11/2022 0853 Gross per 24 hour  Intake 921.56 ml  Output 560 ml  Net 361.56 ml   Weight change:  Exam:  General:  somnolent, hard of hearing, NAD, cooperative. Dry mucus membranes.  HEENT: No icterus, No thrush, No neck mass, [place]/AT Cardiovascular: normal s1,s2 sounds, no m/r/g heard. Respiratory: bilateral BS shallow but clear, no rales, rare exp wheezing heard. Abdomen: soft, nondistended, no masses palpated. Normal BS Extremities: no clubbing or cyanosis, trace pretibial edema.  Data Reviewed: I have personally reviewed following labs and imaging studies Basic Metabolic Panel: Recent Labs  Lab 06/05/22 0357 06/06/22 0352 06/07/22 0412 06/08/22 0344 06/09/22 0503 06/10/22 0549 06/11/22 0707  NA 131* 134* 137 140 144 146* 145  K 4.6 4.6 4.4 4.7 4.1 3.9 3.3*  CL 103 105 108 111 112* 109 107  CO2 '22 24 24 23 26 29 30  '$ GLUCOSE 187* 125* 125* 72 140* 112* 118*  BUN 48* 50* 43* 47* 54* 54* 51*  CREATININE 2.28* 2.05* 1.81* 1.82* 1.88* 1.97* 1.66*  CALCIUM 7.6* 7.8* 8.1* 8.4* 8.9 9.2 9.6  MG 2.1 2.1  --  2.3 2.3 2.0  --   PHOS 4.5 4.5  --  4.8*  --   --   --    Liver Function Tests: Recent Labs  Lab 06/08/22 0344 06/09/22 0503 06/09/22 0905 06/10/22 0549 06/11/22 0707  AST '16 19 21 22 30  '$ ALT '13 14 14 14 18   '$ ALKPHOS 79 90 89 91 122  BILITOT 2.7* 3.3* 3.2* 3.8* 4.8*  PROT 4.4* 4.6* 4.7* 4.7* 5.6*  ALBUMIN <1.5* <1.5* <1.5* <1.5* 1.8*   No results for input(s): "LIPASE", "AMYLASE" in the last 168 hours.  No results for input(s): "AMMONIA" in the last 168 hours. Coagulation Profile: No results for input(s): "INR", "PROTIME" in the last 168 hours.  CBC: Recent Labs  Lab 06/06/22 0352 06/07/22 0412 06/08/22 0344 06/09/22 0503 06/10/22 0549 06/11/22 0707  WBC 24.0* 25.2* 23.0* 23.5* 25.6* 31.0*  NEUTROABS 20.5*  --  17.9* 18.6* 20.8* 25.8*  HGB 10.9* 11.8* 11.7* 12.0 12.4 13.4  HCT 33.7* 37.6 37.9 38.3 38.9 41.4  MCV 94.7 94.5 95.5 93.9 93.5 91.8  PLT 137* 121* 132* 122* 156 228   Cardiac Enzymes: No results for input(s): "CKTOTAL", "CKMB", "CKMBINDEX", "TROPONINI" in the last 168 hours. BNP: Invalid input(s): "POCBNP" CBG: Recent Labs  Lab 06/10/22 1709 06/10/22 2203 06/11/22 0250 06/11/22 0724 06/11/22 1124  GLUCAP 141* 153* 117* 102* 117*   HbA1C: No results for input(s): "HGBA1C" in the last 72 hours. Urine analysis:    Component Value Date/Time   COLORURINE AMBER (A) 06/04/2022 1740  APPEARANCEUR CLOUDY (A) 06/04/2022 0520   APPEARANCEUR Cloudy (A) 04/13/2021 1530   LABSPEC 1.027 06/04/2022 0520   PHURINE 5.0 06/04/2022 0520   GLUCOSEU NEGATIVE 06/04/2022 0520   HGBUR NEGATIVE 06/04/2022 0520   BILIRUBINUR NEGATIVE 06/04/2022 0520   BILIRUBINUR Negative 04/13/2021 [place] 06/04/2022 0520   PROTEINUR 100 (A) 06/04/2022 0520   NITRITE NEGATIVE 06/04/2022 0520   LEUKOCYTESUR SMALL (A) 06/04/2022 0520    Recent Results (from the past 240 hour(s))  Cath Tip Culture     Status: None   Collection Time: 06/03/22  8:07 PM   Specimen: Catheter Tip  Result Value Ref Range Status   Specimen Description CATH TIP  Final   Special Requests NONE  Final   Culture   Final    NO GROWTH 3 DAYS Performed at [facility], [place] [address],  [place], [place] [address]    Report Status 06/09/2022 FINAL  Final  Culture, blood (Routine x 2)     Status: Abnormal   Collection Time: 06/03/22  8:45 PM   Specimen: BLOOD  Result Value Ref Range Status   Specimen Description   Final    BLOOD LEFT ANTECUBITAL Performed at [facility], [place] [address], [place], [place] [address]    Special Requests   Final    BOTTLES DRAWN AEROBIC AND ANAEROBIC Blood Culture adequate volume Performed at [facility], [address] [place], [place] [address]    Culture  Setup Time   Final    YEAST BOTH Gram Stain Report Called to,Read Back By and Verified With: JACOBS, U'@0745'$  BY MATTHEWS, B 7.4.2023 CRITICAL RESULT CALLED TO, READ BACK BY AND VERIFIED WITH: PHARMD S.HURTZ AT 1130 ON 06/06/2022 BY T.SAAD.    Culture (A)  Final    CANDIDA GLABRATA Sent to [place] for further susceptibility testing. Performed at [facility], [place] [address] [place], [place] [address]    Report Status 06/09/2022 FINAL  Final  Blood Culture ID Panel (Reflexed)     Status: Abnormal   Collection Time: 06/03/22  8:45 PM  Result Value Ref Range Status   Enterococcus faecalis NOT DETECTED NOT DETECTED Final   Enterococcus Faecium NOT DETECTED NOT DETECTED Final   Listeria monocytogenes NOT DETECTED NOT DETECTED Final   Staphylococcus species NOT DETECTED NOT DETECTED Final   Staphylococcus aureus (BCID) NOT DETECTED NOT DETECTED Final   Staphylococcus epidermidis NOT DETECTED NOT DETECTED Final   Staphylococcus lugdunensis NOT DETECTED NOT DETECTED Final   Streptococcus species NOT DETECTED NOT DETECTED Final   Streptococcus agalactiae NOT DETECTED NOT DETECTED Final   Streptococcus pneumoniae NOT DETECTED NOT DETECTED Final   Streptococcus pyogenes NOT DETECTED NOT DETECTED Final   A.calcoaceticus-baumannii NOT DETECTED NOT DETECTED Final   Bacteroides fragilis NOT DETECTED NOT DETECTED Final   Enterobacterales NOT DETECTED NOT DETECTED Final   Enterobacter  cloacae complex NOT DETECTED NOT DETECTED Final   Escherichia coli NOT DETECTED NOT DETECTED Final   Klebsiella aerogenes NOT DETECTED NOT DETECTED Final   Klebsiella oxytoca NOT DETECTED NOT DETECTED Final   Klebsiella pneumoniae NOT DETECTED NOT DETECTED Final   Proteus species NOT DETECTED NOT DETECTED Final   Salmonella species NOT DETECTED NOT DETECTED Final   Serratia marcescens NOT DETECTED NOT DETECTED Final   Haemophilus influenzae NOT DETECTED NOT DETECTED Final   Neisseria meningitidis NOT DETECTED NOT DETECTED Final   Pseudomonas aeruginosa NOT DETECTED NOT DETECTED Final   Stenotrophomonas maltophilia NOT DETECTED NOT  DETECTED Final   Candida albicans NOT DETECTED NOT DETECTED Final   Candida auris NOT DETECTED NOT DETECTED Final   Candida glabrata DETECTED (A) NOT DETECTED Final    Comment: CRITICAL RESULT CALLED TO, READ BACK BY AND VERIFIED WITH: PHARMD S.HURTZ AT 1130 ON 06/06/2022 BY T.SAAD.    Candida krusei NOT DETECTED NOT DETECTED Final   Candida parapsilosis NOT DETECTED NOT DETECTED Final   Candida tropicalis NOT DETECTED NOT DETECTED Final   Cryptococcus neoformans/gattii NOT DETECTED NOT DETECTED Final    Comment: Performed at [facility], [place] [address], [place], [place] [address]  Antifungal AST 9 Drug Panel     Status: None (Preliminary result)   Collection Time: 06/03/22  8:45 PM  Result Value Ref Range Status   Organism ID, Yeast Preliminary report  Final    Comment: (NOTE) Specimen has been received and testing has been initiated. Performed At: [facility] [place], [place] [id number] Rush Farmer MD HL:[id number]    Amphotericin B MIC PENDING  Incomplete   Anidulafungin MIC PENDING  Incomplete   Caspofungin MIC PENDING  Incomplete   Micafungin MIC PENDING  Incomplete   Posaconazole MIC PENDING  Incomplete   Fluconazole Islt MIC PENDING  Incomplete   Flucytosine MIC PENDING  Incomplete   Itraconazole MIC PENDING   Incomplete   Voriconazole MIC PENDING  Incomplete   Source [id number] [facility] BLD SENSI C GLABRATA  Final    Comment: Performed at [facility], [place] [address] [place], [place] [address]  Culture, blood (Routine x 2)     Status: Abnormal (Preliminary result)   Collection Time: 06/03/22  9:32 PM   Specimen: BLOOD  Result Value Ref Range Status   Specimen Description   Final    BLOOD LEFT ANTECUBITAL Performed at [facility], [place] [address], [place], [place] [address]    Special Requests   Final    BOTTLES DRAWN AEROBIC AND ANAEROBIC Blood Culture adequate volume Performed at [facility], [address], [place], [place] [address]    Culture  Setup Time   Final    AEROBIC BOTTLE ONLY YEAST Gram Stain Report Called to,Read Back By and Verified With: UTE JACOBS @ 1200 ON 06/06/22 C [place] CRITICAL VALUE NOTED.  VALUE IS CONSISTENT WITH PREVIOUSLY REPORTED AND CALLED VALUE.    Culture (A)  Final    CANDIDA GLABRATA Sent to [place] for further susceptibility testing. Performed at [facility], [place] [address] [place], [place] [address]    Report Status PENDING  Incomplete  MRSA Next Gen by PCR, Nasal     Status: None   Collection Time: 06/04/22  2:18 AM   Specimen: Nasal Mucosa; Nasal Swab  Result Value Ref Range Status   MRSA by PCR Next Gen NOT DETECTED NOT DETECTED Final    Comment: (NOTE) The GeneXpert MRSA Assay (FDA approved for NASAL specimens only), is one component of a comprehensive MRSA colonization surveillance program. It is not intended to diagnose MRSA infection nor to guide or monitor treatment for MRSA infections. Test performance is not FDA approved in patients less than 64 years old. Performed at [facility], [address], [place], [place] [address]   Urine Culture     Status: Abnormal   Collection Time: 06/04/22  2:28 AM   Specimen: PATH Cytology Urine  Result Value Ref Range Status   Specimen Description   Final    URINE, CLEAN CATCH Performed  at [facility], [address] [place], [place] [address]    Special Requests   Final    NONE Performed at [facility], [address] [place], [place] [address]    Culture (A)  Final    <10,000 COLONIES/mL INSIGNIFICANT GROWTH Performed at [place] [address] [place], [place] [address]    Report Status 06/05/2022 FINAL  Final  Aerobic/Anaerobic Culture w Gram Stain (surgical/deep wound)     Status: None   Collection Time: 06/04/22  3:03 AM   Specimen: PATH Cytology FNA; Body Fluid  Result Value Ref Range Status   Specimen Description   Final    ANAL Performed at [facility], [address], [place], [place] [address]    Special Requests   Final    NONE Performed at [facility], [address] [place], [place] [address]    Gram Stain   Final    MODERATE WBC PRESENT,BOTH PMN AND MONONUCLEAR FEW GRAM POSITIVE COCCI IN PAIRS MODERATE GRAM POSITIVE RODS    Culture   Final    FEW CANDIDA ALBICANS NO ANAEROBES ISOLATED Performed at [facility], [place] [address], [place], [place] [address]    Report Status 06/09/2022 FINAL  Final  Culture, blood (Routine X 2) w Reflex to ID Panel     Status: None   Collection Time: 06/06/22  9:32 AM   Specimen: BLOOD LEFT WRIST  Result Value Ref Range Status   Specimen Description BLOOD LEFT WRIST  Final   Special Requests   Final    BOTTLES DRAWN AEROBIC ONLY Blood Culture results may not be optimal due to an inadequate volume of blood received in culture bottles   Culture   Final    NO GROWTH 5 DAYS Performed at [facility], [address], [place], [place] [address]    Report Status 06/11/2022 FINAL  Final  Culture, blood (Routine X 2) w Reflex to ID Panel     Status: None   Collection Time: 06/06/22  9:33 AM   Specimen: Left Antecubital; Blood  Result Value Ref Range Status   Specimen Description LEFT ANTECUBITAL  Final   Special Requests   Final    BOTTLES DRAWN AEROBIC AND ANAEROBIC Blood Culture adequate  volume   Culture   Final    NO GROWTH 5 DAYS Performed at [facility], [address] [place],  [address]    Report Status 06/11/2022 FINAL  Final     Scheduled Meds:  amLODipine  2.5 mg Oral Daily   Chlorhexidine Gluconate Cloth  6 each Topical Daily   heparin  5,000 Units Subcutaneous Q8H   insulin aspart  0-9 Units Subcutaneous TID WC   mouth rinse  15 mL Mouth Rinse Q4H   pantoprazole (PROTONIX) IV  40 mg Intravenous Q12H   Continuous Infusions:  micafungin (MYCAMINE) 100 mg in sodium chloride 0.9 % 100 mL IVPB 100 mg (06/11/22 1217)    Procedures/Studies: DG CHEST PORT 1 VIEW  Result Date: 06/09/2022 CLINICAL DATA:  Weakness, wheezing EXAM: PORTABLE CHEST 1 VIEW COMPARISON:  06/05/2022 FINDINGS: Interval endotracheal and esophagogastric extubation. Small to moderate bilateral pleural effusions, increased compared to prior examination, with associated atelectasis or consolidation and diffuse bilateral interstitial pulmonary opacity. Cardiomegaly. IMPRESSION: 1. Small to moderate bilateral pleural effusions, increased compared to prior examination, with associated atelectasis or consolidation and diffuse bilateral interstitial pulmonary opacity. Findings are most consistent with worsened edema. 2. Interval endotracheal and esophagogastric extubation. 3. Cardiomegaly.  Electronically Signed   By: Delanna Ahmadi M.D.   On: 06/09/2022 12:25   DG UGI W SMALL BOWEL  Result Date: 06/08/2022 CLINICAL DATA:  Post repair of 1.5 cm diameter perforated anterior wall gastric antral ulcer EXAM: WATER SOLUBLE UPPER GI SERIES TECHNIQUE: Single-column upper GI series was performed using water soluble contrast. Radiation Exposure Index (as provided by the fluoroscopic device): 43.1 mGy Kerma CONTRAST:  100 cc Omnipaque 300 via NG tube COMPARISON:  CT abdomen and pelvis 06/03/2022 FLUOROSCOPY: Fluoroscopy Time:  2 minutes 36 seconds Radiation Exposure Index (if provided by the fluoroscopic device):  43.1 Number of Acquired Spot Images: Multiple fluoroscopic screen captures and cine series FINDINGS: Contrast administered via NG tube opacifies the gastric lumen. Patent pylorus with passage of contrast through normal appearing duodenal C loop into proximal jejunum with incidentally noted duodenal diverticulum at second portion. Irregularity of rugal folds with thickening and distortion at anterior wall of gastric antrum at site of perforation repair. The third portion of the duodenum is superimposed with the inferior wall of the gastric antrum, mildly limiting exam. A blush of contrast is seen inferior to the antrum, superimposed with the third portion of the duodenum throughout the study. This appears to move in concert with the duodenum with respiration. No contrast opacification of the JP drain. No gross evidence of contrast leak identified though it is difficult to completely exclude a small leak at the site of repair. IMPRESSION: Distortion and thickening of rugal folds at site of antral gastric ulcer repair. No gross evidence of leak at the site of ulcer repair as discussed above. Electronically Signed   By: Lavonia Dana M.D.   On: 06/08/2022 15:32   ECHOCARDIOGRAM COMPLETE  Result Date: 06/07/2022    ECHOCARDIOGRAM REPORT   Patient Name:   ADAYAH AROCHO Date of Exam: 06/07/2022 Medical Rec #:  [id number]       Height:       61.0 in Accession #:    [id number]      Weight:       112.2 lb Date of Birth:  12/25/1945        BSA:          1.477 m Patient Age:    47 years        BP:           116/46 mmHg Patient Gender: F               HR:           104 bpm. Exam Location:  [facility] Procedure: 2D Echo, Cardiac Doppler and Color Doppler Indications:    Bacteremia  History:        Patient has no prior history of Echocardiogram examinations.                 Signs/Symptoms:Bacteremia; Risk Factors:Hypertension and                 Dyslipidemia. Breast CA.  Sonographer:    Wenda Low Referring Phys: [id number]  [place]  1. Abnormal septal motion . Left ventricular ejection fraction, by estimation, is 55 to 60%. The left ventricle has normal function. The left ventricle has no regional wall motion abnormalities. Left ventricular diastolic parameters were normal.  2. Right ventricular systolic function is normal. The right ventricular size is normal. There is mildly elevated pulmonary artery systolic pressure.  3. Moderate pleural effusion in the left lateral region.  4. The mitral valve  is abnormal. Trivial mitral valve regurgitation. No evidence of mitral stenosis.  5. Thickening and calcification noted . The tricuspid valve is abnormal.  6. Some thickening in the LVOT near intervalvular fibrosa Given this and nodular calcification of PV suggest TEE if suspicion for SBE high. The aortic valve is tricuspid. There is moderate calcification of the aortic valve. There is moderate thickening of the aortic valve. Aortic valve regurgitation is mild. Aortic valve sclerosis/calcification is present, without any evidence of aortic stenosis.  7. Nodular calcification seen on PV. The pulmonic valve was abnormal.  8. The inferior vena cava is normal in size with greater than 50% respiratory variability, suggesting right atrial pressure of 3 mmHg. FINDINGS  Left Ventricle: Abnormal septal motion. Left ventricular ejection fraction, by estimation, is 55 to 60%. The left ventricle has normal function. The left ventricle has no regional wall motion abnormalities. The left ventricular internal cavity size was normal in size. There is no left ventricular hypertrophy. Left ventricular diastolic parameters were normal. Right Ventricle: The right ventricular size is normal. No increase in right ventricular wall thickness. Right ventricular systolic function is normal. There is mildly elevated pulmonary artery systolic pressure. The tricuspid regurgitant velocity is 3.21  m/s, and with an assumed right atrial pressure of 3 mmHg,  the estimated right ventricular systolic pressure is 34.1 mmHg. Left Atrium: Left atrial size was normal in size. Right Atrium: Right atrial size was normal in size. Pericardium: There is no evidence of pericardial effusion. Mitral Valve: The mitral valve is abnormal. There is mild thickening of the mitral valve leaflet(s). There is mild calcification of the mitral valve leaflet(s). Mild mitral annular calcification. Trivial mitral valve regurgitation. No evidence of mitral valve stenosis. MV peak gradient, 3.9 mmHg. The mean mitral valve gradient is 1.0 mmHg. Tricuspid Valve: Thickening and calcification noted. The tricuspid valve is abnormal. Tricuspid valve regurgitation is trivial. No evidence of tricuspid stenosis. Aortic Valve: Some thickening in the LVOT near intervalvular fibrosa Given this and nodular calcification of PV suggest TEE if suspicion for SBE high. The aortic valve is tricuspid. There is moderate calcification of the aortic valve. There is moderate thickening of the aortic valve. Aortic valve regurgitation is mild. Aortic valve sclerosis/calcification is present, without any evidence of aortic stenosis. Aortic valve mean gradient measures 5.0 mmHg. Aortic valve peak gradient measures 10.3 mmHg. Aortic valve area, by VTI measures 2.02 cm. Pulmonic Valve: Nodular calcification seen on PV. The pulmonic valve was abnormal. Pulmonic valve regurgitation is trivial. No evidence of pulmonic stenosis. Aorta: The aortic root is normal in size and structure. Venous: The inferior vena cava is normal in size with greater than 50% respiratory variability, suggesting right atrial pressure of 3 mmHg. IAS/Shunts: No atrial level shunt detected by color flow Doppler. Additional Comments: There is a moderate pleural effusion in the left lateral region.  LEFT VENTRICLE PLAX 2D LVIDd:         4.50 cm     Diastology LVIDs:         3.00 cm     LV e' medial:    7.29 cm/s LV PW:         0.80 cm     LV E/e' medial:  11.8  LV IVS:        0.80 cm     LV e' lateral:   9.79 cm/s LVOT diam:     1.80 cm     LV E/e' lateral: 8.8 LV SV:  55 LV SV Index:   37 LVOT Area:     2.54 cm  LV Volumes (MOD) LV vol d, MOD A2C: 43.7 ml LV vol d, MOD A4C: 52.7 ml LV vol s, MOD A2C: 17.3 ml LV vol s, MOD A4C: 23.8 ml LV SV MOD A2C:     26.4 ml LV SV MOD A4C:     52.7 ml LV SV MOD BP:      28.6 ml RIGHT VENTRICLE RV Basal diam:  3.10 cm RV Mid diam:    2.90 cm RV S prime:     14.50 cm/s TAPSE (M-mode): 3.0 cm LEFT ATRIUM             Index        RIGHT ATRIUM           Index LA diam:        3.60 cm 2.44 cm/m   RA Area:     11.50 cm LA Vol (A2C):   50.9 ml 34.45 ml/m  RA Volume:   21.80 ml  14.76 ml/m LA Vol (A4C):   43.7 ml 29.58 ml/m LA Biplane Vol: 49.8 ml 33.71 ml/m  AORTIC VALVE                     PULMONIC VALVE AV Area (Vmax):    1.92 cm      PV Vmax:       0.98 m/s AV Area (Vmean):   1.84 cm      PV Peak grad:  3.9 mmHg AV Area (VTI):     2.02 cm AV Vmax:           160.50 cm/s AV Vmean:          103.550 cm/s AV VTI:            0.274 m AV Peak Grad:      10.3 mmHg AV Mean Grad:      5.0 mmHg LVOT Vmax:         121.00 cm/s LVOT Vmean:        74.800 cm/s LVOT VTI:          0.218 m LVOT/AV VTI ratio: 0.79  AORTA Ao Root diam: 3.30 cm MITRAL VALVE               TRICUSPID VALVE MV Area (PHT): 6.02 cm    TR Peak grad:   41.2 mmHg MV Area VTI:   2.25 cm    TR Vmax:        321.00 cm/s MV Peak grad:  3.9 mmHg MV Mean grad:  1.0 mmHg    SHUNTS MV Vmax:       0.99 m/s    Systemic VTI:  0.22 m MV Vmean:      48.8 cm/s   Systemic Diam: 1.80 cm MV Decel Time: 126 msec MV E velocity: 86.00 cm/s MV A velocity: 72.70 cm/s MV E/A ratio:  1.18 Jenkins Rouge MD Electronically signed by Jenkins Rouge MD Signature Date/Time: 06/07/2022/10:28:36 AM    Final    DG CHEST PORT 1 VIEW  Result Date: 06/05/2022 CLINICAL DATA:  76 year old female with respiratory failure. Intubated. EXAM: PORTABLE CHEST 1 VIEW COMPARISON:  Portable chest 06/04/2022 and earlier.  FINDINGS: Portable AP semi upright view at 0527 hours. Endotracheal tube tip in good position between the clavicles and carina. Stable left IJ central line and visible enteric tube. Increased left lung base and retrocardiac opacity now mostly obscuring the left hemidiaphragm.  Slightly lower lung volumes overall. Mediastinal contours remain normal. No pneumothorax, pulmonary edema, or definite effusion. Negative right lung. Partially visible midline abdominal skin staples. Paucity of bowel gas in the upper abdomen. Stable left chest and axillary surgical clips. IMPRESSION: 1. Stable lines and tubes. 2. Increased left lower lobe collapse or consolidation since yesterday. Electronically Signed   By: Genevie Ann M.D.   On: 06/05/2022 08:26   DG Chest Port 1 View  Result Date: 06/04/2022 CLINICAL DATA:  Endotracheal tube, nasogastric tube and central line placement. EXAM: PORTABLE CHEST 1 VIEW COMPARISON:  June 03, 2022 FINDINGS: Since the prior study there has been interval placement of an endotracheal tube. Its distal tip is seen approximately 3.8 cm from the carina. Interval nasogastric tube placement is also seen. Its distal and extends into the gastric lumen. There is a left internal jugular venous catheter with its distal tip overlying the distal superior vena cava. This is approximately 6 mm proximal to the junction of the superior vena cava and right atrium. The heart size and mediastinal contours are within normal limits. Mild to moderate severity atelectasis and/or infiltrate is seen within the left lung base. This represents a new finding when compared to the prior study. There is no evidence of a pleural effusion or pneumothorax. Radiopaque surgical clips are seen along the left axilla. The air seen below the right hemidiaphragm on the prior study is no longer visualized. The visualized skeletal structures are unremarkable. IMPRESSION: 1. Interval endotracheal tube, nasogastric tube and left internal jugular  venous catheter placement positioning, as described above. 2. Mild to moderate severity left basilar atelectasis and/or infiltrate. Electronically Signed   By: Virgina Norfolk M.D.   On: 06/04/2022 02:47   CT Abdomen Pelvis Wo Contrast  Result Date: 06/03/2022 CLINICAL DATA:  Severe weakness for 2 days with acute abdominal pain, initial encounter EXAM: CT ABDOMEN AND PELVIS WITHOUT CONTRAST TECHNIQUE: Multidetector CT imaging of the abdomen and pelvis was performed following the standard protocol without IV contrast. RADIATION DOSE REDUCTION: This exam was performed according to the departmental dose-optimization program which includes automated exposure control, adjustment of the mA and/or kV according to patient size and/or use of iterative reconstruction technique. COMPARISON:  None Available. FINDINGS: Lower chest: Basilar atelectatic changes are noted. Hepatobiliary: Liver is within normal limits. Gallbladder demonstrates a normal appearance. Pancreas: Unremarkable. No pancreatic ductal dilatation or surrounding inflammatory changes. Spleen: Normal in size without focal abnormality. Adrenals/Urinary Tract: Adrenal glands are within normal limits. Kidneys demonstrate tiny nonobstructing stones on right in the lower pole. No ureteral obstruction is seen. The bladder is decompressed. Stomach/Bowel: Considerable fecal material is noted within the rectal vault which may represent a focal impaction. Diverticular change of the colon is noted as well as some generalized wall thickening throughout the colon. The appendix is within normal limits. Small bowel is within normal limits. Stomach demonstrates a defect in the anterior gastric wall consistent with a perforated ulcer. There is a considerable amount of air and fluid throughout the abdomen with apparent direct communication with the gastric best seen on image number 36 of series 2 and image number 78 of series 6. Vascular/Lymphatic: Aortic atherosclerosis. No  enlarged abdominal or pelvic lymph nodes. Reproductive: Status post hysterectomy. No adnexal masses. Other: Free air and free fluid similar to that described above throughout the abdomen. The bowel demonstrates some reactive wall thickening related to the fluid and air. Musculoskeletal: Degenerative changes of the lumbar spine are seen. No other bony abnormality is  noted. IMPRESSION: Changes consistent with a perforated gastric ulcer anteriorly with considerable spillage of fluid and air into the abdominal cavity. Some reactive wall thickening is noted within the bowel loops secondary to these changes. Critical Value/emergent results were called by telephone at the time of interpretation on 06/03/2022 at 10:43 pm to Dr. Aletta Edouard , who verbally acknowledged these results. Electronically Signed   By: Inez Catalina M.D.   On: 06/03/2022 22:45   DG Chest Port 1 View  Result Date: 06/03/2022 CLINICAL DATA:  Weakness. EXAM: PORTABLE CHEST 1 VIEW COMPARISON:  None Available. FINDINGS: Multiple overlying radiopaque cardiac lead wires are seen. The heart size and mediastinal contours are within normal limits. Mild, diffuse, chronic appearing increased lung markings are seen. There is no evidence of an acute infiltrate, pleural effusion or pneumothorax. Radiopaque surgical clips are seen along the lateral aspect of the left chest wall. A crescentic area of air is seen just below the right hemidiaphragm. Degenerative changes seen throughout the thoracic spine. IMPRESSION: 1. Chronic appearing increased lung markings without evidence of acute or active cardiopulmonary disease. 2. Air seen just below the right hemidiaphragm which may represent an air filled loop of colon. Further evaluation with abdomen pelvis CT is recommended, as the presence of intra-abdominal free air cannot be excluded. Electronically Signed   By: Virgina Norfolk M.D.   On: 06/03/2022 21:22    Time spent: 35 mins   Maanya Hippert Wynetta Emery, MD How to  contact the [facility] Attending or Consulting provider [place] or covering provider during after hours [place], for this patient?  Check the care team in [facility] and look for a) attending/consulting [facility] provider listed and b) the [facility] team listed Log into [email or web address] and use [place]'s universal password to access. If you do not have the password, please contact the hospital operator. Locate the [facility] provider you are looking for under [facility] and page to a number that you can be directly reached. If you still have difficulty reaching the provider, please page the [facility] (Director on Call) for the Hospitalists listed on amion for assistance.   [facility]  If 7PM-7AM, please contact night-coverage [email or web address] Password [email or web address] 06/11/2022, 12:23 PM   LOS: 7 days

## 2022-06-11 NOTE — Progress Notes (Addendum)
Pharmacy Antibiotic Note  Carolyn Erickson is a 76 y.o. female admitted on 06/03/2022 with  gastric perforation s/p OR .  Pharmacy has been consulted for Zosyn dosing for 5 day duration. WBC ok, noted renal dysfunction.   Plan: Zosyn 3.375G IV q8h to be infused over 4 hours x 5 days  Trend WBC, temp, renal function  Height: '5\' 1"'$  (154.9 cm) Weight: 54.9 kg (121 lb 0.5 oz) (removed pillows except one/scd machine) IBW/kg (Calculated) : 47.8  Temp (24hrs), Avg:98.2 F (36.8 C), Min:98.2 F (36.8 C), Max:98.2 F (36.8 C)  Recent Labs  Lab 06/05/22 0955 06/06/22 0352 06/07/22 0412 06/08/22 0344 06/09/22 0503 06/10/22 0549 06/11/22 0707  WBC  --    < > 25.2* 23.0* 23.5* 25.6* 31.0*  CREATININE  --    < > 1.81* 1.82* 1.88* 1.97* 1.66*  LATICACIDVEN 1.3  --   --   --   --   --   --    < > = values in this interval not displayed.     Estimated Creatinine Clearance: 21.1 mL/min (A) (by C-G formula based on SCr of 1.66 mg/dL (H)).    Micafungin 7/4 >> Zosyn 7/2 x 5 days>> 7/6  Restart 7/9 >> Fluconazole 7/2 x 5 days  >> 7/4  Margot Ables, PharmD Clinical Pharmacist 06/11/2022 2:16 PM

## 2022-06-11 NOTE — Plan of Care (Signed)
Rising wbc Afebrile  Ct showed gastric leak   [facility] readded Surgery team following  Remains on micafungin   -agree with above -will await further surgery input on ct finding

## 2022-06-11 NOTE — Progress Notes (Signed)
Pt slept fairly well, with brief episodes of restlessness, anxiousness, and agitation. Family at bedside first few hours of shift, pt much calmer with their presence. Medicated x2 for generalized pain, pt unable to communicate specifics. Pt noted to be resting soundly within hour reassessment. Turned and repositioned throughout the night. Incontinent care provided throughout the night with rounding. Mouth care performed as well. VSS.

## 2022-06-11 NOTE — Assessment & Plan Note (Addendum)
-  WBC trending down appropriately; peak in the 30,000 range and most recently check down into the 16000.  -- appreciate ID and surgery recommendations, continue current antibiotic therapy as instructed. 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

## 2022-06-11 NOTE — Progress Notes (Signed)
Spoke with Hinton Dyer RN re PICC placement to be done 06/12/22 am.  Hinton Dyer RN to obtain consent from spouse.

## 2022-06-11 NOTE — Progress Notes (Signed)
7 Days Post-Op  Subjective: Patient more confused and somnolent this morning.  Was not oriented.  Objective: Vital signs in last 24 hours: Temp:  [97.9 F (36.6 C)-98.2 F (36.8 C)] 98.2 F (36.8 C) (07/09 0628) Pulse Rate:  [76-101] 76 (07/09 0628) Resp:  [14-20] 14 (07/09 0628) BP: (116-158)/(62-79) 137/73 (07/09 0628) SpO2:  [95 %] 95 % (07/09 0628) Weight:  [54.9 kg] 54.9 kg (07/09 0627) Last BM Date : 06/09/22  Intake/Output from previous day: 07/08 0701 - 07/09 0700 In: 801.6 [P.O.:600; IV Piggyback:201.6] Out: 1130 [Urine:1050; Drains:80] Intake/Output this shift: No intake/output data recorded.  General appearance: combative and no distress GI: Soft with increased distention noted.  Incision healing well.  JP drainage serous in nature.  No bile present.  Lab Results:  Recent Labs    06/10/22 0549 06/11/22 0707  WBC 25.6* 31.0*  HGB 12.4 13.4  HCT 38.9 41.4  PLT 156 228   BMET Recent Labs    06/09/22 0503 06/10/22 0549  NA 144 146*  K 4.1 3.9  CL 112* 109  CO2 26 29  GLUCOSE 140* 112*  BUN 54* 54*  CREATININE 1.88* 1.97*  CALCIUM 8.9 9.2   PT/INR No results for input(s): "LABPROT", "INR" in the last 72 hours.  Studies/Results: DG CHEST PORT 1 VIEW  Result Date: 06/09/2022 CLINICAL DATA:  Weakness, wheezing EXAM: PORTABLE CHEST 1 VIEW COMPARISON:  06/05/2022 FINDINGS: Interval endotracheal and esophagogastric extubation. Small to moderate bilateral pleural effusions, increased compared to prior examination, with associated atelectasis or consolidation and diffuse bilateral interstitial pulmonary opacity. Cardiomegaly. IMPRESSION: 1. Small to moderate bilateral pleural effusions, increased compared to prior examination, with associated atelectasis or consolidation and diffuse bilateral interstitial pulmonary opacity. Findings are most consistent with worsened edema. 2. Interval endotracheal and esophagogastric extubation. 3. Cardiomegaly. Electronically  Signed   By: Delanna Ahmadi M.D.   On: 06/09/2022 12:25    Anti-infectives: Anti-infectives (From admission, onward)    Start     Dose/Rate Route Frequency Ordered Stop   06/07/22 0200  fluconazole (DIFLUCAN) IVPB 200 mg  Status:  Discontinued        200 mg 100 mL/hr over 60 Minutes Intravenous Every 24 hours 06/06/22 0901 06/06/22 1031   06/06/22 1200  micafungin (MYCAMINE) 100 mg in sodium chloride 0.9 % 100 mL IVPB        100 mg 105 mL/hr over 1 Hours Intravenous Every 24 hours 06/06/22 1031     06/05/22 1800  piperacillin-tazobactam (ZOSYN) IVPB 3.375 g        3.375 g 12.5 mL/hr over 240 Minutes Intravenous Every 12 hours 06/05/22 0825 06/09/22 2000   06/04/22 0600  piperacillin-tazobactam (ZOSYN) IVPB 3.375 g  Status:  Discontinued        3.375 g 12.5 mL/hr over 240 Minutes Intravenous Every 8 hours 06/04/22 0219 06/05/22 0825   06/04/22 0315  fluconazole (DIFLUCAN) IVPB 200 mg  Status:  Discontinued        200 mg 100 mL/hr over 60 Minutes Intravenous Every 24 hours 06/04/22 0217 06/04/22 0219   06/04/22 0315  fluconazole (DIFLUCAN) IVPB 200 mg  Status:  Discontinued        200 mg 100 mL/hr over 60 Minutes Intravenous Every 24 hours 06/04/22 0219 06/06/22 0901   06/03/22 2130  ceFEPIme (MAXIPIME) 2 g in sodium chloride 0.9 % 100 mL IVPB        2 g 200 mL/hr over 30 Minutes Intravenous  Once 06/03/22 2127 06/03/22 2209  06/03/22 2130  metroNIDAZOLE (FLAGYL) IVPB 500 mg  Status:  Discontinued        500 mg 100 mL/hr over 60 Minutes Intravenous Every 12 hours 06/03/22 2127 06/04/22 0217       Assessment/Plan: s/p Procedure(s): EXPLORATORY LAPAROTOMY, omental patch Impression: Worsening leukocytosis.  C-Met pending.  We will go ahead and get CT scan with oral contrast to assess the abdomen.  Further management is pending those results.  Discussed with the husband.  LOS: 7 days    Aviva Signs 06/11/2022

## 2022-06-12 ENCOUNTER — Inpatient Hospital Stay (HOSPITAL_COMMUNITY): Payer: Medicare PPO

## 2022-06-12 DIAGNOSIS — B49 Unspecified mycosis: Secondary | ICD-10-CM | POA: Diagnosis not present

## 2022-06-12 DIAGNOSIS — A419 Sepsis, unspecified organism: Secondary | ICD-10-CM | POA: Diagnosis not present

## 2022-06-12 DIAGNOSIS — K255 Chronic or unspecified gastric ulcer with perforation: Secondary | ICD-10-CM | POA: Diagnosis not present

## 2022-06-12 DIAGNOSIS — N179 Acute kidney failure, unspecified: Secondary | ICD-10-CM | POA: Diagnosis not present

## 2022-06-12 LAB — CBC WITH DIFFERENTIAL/PLATELET
Abs Immature Granulocytes: 0.63 10*3/uL — ABNORMAL HIGH (ref 0.00–0.07)
Basophils Absolute: 0.1 10*3/uL (ref 0.0–0.1)
Basophils Relative: 0 %
Eosinophils Absolute: 0.5 10*3/uL (ref 0.0–0.5)
Eosinophils Relative: 2 %
HCT: 35.9 % — ABNORMAL LOW (ref 36.0–46.0)
Hemoglobin: 11.4 g/dL — ABNORMAL LOW (ref 12.0–15.0)
Immature Granulocytes: 2 %
Lymphocytes Relative: 6 %
Lymphs Abs: 1.9 10*3/uL (ref 0.7–4.0)
MCH: 29.3 pg (ref 26.0–34.0)
MCHC: 31.8 g/dL (ref 30.0–36.0)
MCV: 92.3 fL (ref 80.0–100.0)
Monocytes Absolute: 1 10*3/uL (ref 0.1–1.0)
Monocytes Relative: 3 %
Neutro Abs: 26.2 10*3/uL — ABNORMAL HIGH (ref 1.7–7.7)
Neutrophils Relative %: 87 %
Platelets: 247 10*3/uL (ref 150–400)
RBC: 3.89 MIL/uL (ref 3.87–5.11)
RDW: 13.8 % (ref 11.5–15.5)
WBC: 30.3 10*3/uL — ABNORMAL HIGH (ref 4.0–10.5)
nRBC: 0 % (ref 0.0–0.2)

## 2022-06-12 LAB — TRIGLYCERIDES: Triglycerides: 230 mg/dL — ABNORMAL HIGH (ref <150)

## 2022-06-12 LAB — COMPREHENSIVE METABOLIC PANEL
ALT: 18 U/L (ref 0–44)
AST: 27 U/L (ref 15–41)
Albumin: <1.5 g/dL — ABNORMAL LOW (ref 3.5–5.0)
Alkaline Phosphatase: 99 U/L (ref 38–126)
Anion gap: 4 — ABNORMAL LOW (ref 5–15)
BUN: 40 mg/dL — ABNORMAL HIGH (ref 8–23)
CO2: 31 mmol/L (ref 22–32)
Calcium: 9.2 mg/dL (ref 8.9–10.3)
Chloride: 107 mmol/L (ref 98–111)
Creatinine, Ser: 1.44 mg/dL — ABNORMAL HIGH (ref 0.44–1.00)
GFR, Estimated: 37 mL/min — ABNORMAL LOW (ref >60)
Glucose, Bld: 129 mg/dL — ABNORMAL HIGH (ref 70–99)
Potassium: 3 mmol/L — ABNORMAL LOW (ref 3.5–5.1)
Sodium: 142 mmol/L (ref 135–145)
Total Bilirubin: 4.6 mg/dL — ABNORMAL HIGH (ref 0.3–1.2)
Total Protein: 4.7 g/dL — ABNORMAL LOW (ref 6.5–8.1)

## 2022-06-12 LAB — PHOSPHORUS: Phosphorus: 3.5 mg/dL (ref 2.5–4.6)

## 2022-06-12 LAB — GLUCOSE, CAPILLARY
Glucose-Capillary: 114 mg/dL — ABNORMAL HIGH (ref 70–99)
Glucose-Capillary: 135 mg/dL — ABNORMAL HIGH (ref 70–99)

## 2022-06-12 LAB — MAGNESIUM: Magnesium: 1.6 mg/dL — ABNORMAL LOW (ref 1.7–2.4)

## 2022-06-12 MED ORDER — MAGNESIUM SULFATE 2 GM/50ML IV SOLN: 2.0000 g | Freq: Once | INTRAVENOUS | Status: DC

## 2022-06-12 MED ORDER — TRAVASOL 10 % IV SOLN
INTRAVENOUS | Status: AC
  Filled 2022-06-12: qty 352.8

## 2022-06-12 MED ORDER — KCL IN DEXTROSE-NACL 10-5-0.45 MEQ/L-%-% IV SOLN
INTRAVENOUS | Status: DC
  Filled 2022-06-12: qty 1000

## 2022-06-12 MED ORDER — DONEPEZIL HCL 5 MG PO TABS
10.0000 mg | ORAL_TABLET | Freq: Every day | ORAL | Status: DC
  Administered 2022-06-12 – 2022-06-22 (×8): 10 mg via ORAL
  Filled 2022-06-12 (×9): qty 2

## 2022-06-12 MED ORDER — SODIUM CHLORIDE 0.9% FLUSH
10.0000 mL | Freq: Two times a day (BID) | INTRAVENOUS | Status: DC
  Administered 2022-06-12 – 2022-06-24 (×23): 10 mL

## 2022-06-12 MED ORDER — VENLAFAXINE HCL 37.5 MG PO TABS
37.5000 mg | ORAL_TABLET | Freq: Two times a day (BID) | ORAL | Status: DC
  Administered 2022-06-12 – 2022-06-24 (×21): 37.5 mg via ORAL
  Filled 2022-06-12 (×22): qty 1

## 2022-06-12 MED ORDER — SODIUM CHLORIDE 0.9% FLUSH: 10.0000 mL | INTRAVENOUS | Status: DC | PRN

## 2022-06-12 MED ORDER — POTASSIUM CHLORIDE 10 MEQ/100ML IV SOLN
10.0000 meq | INTRAVENOUS | Status: AC
  Administered 2022-06-12 (×4): 10 meq via INTRAVENOUS
  Filled 2022-06-12: qty 100

## 2022-06-12 MED ORDER — ATORVASTATIN CALCIUM 10 MG PO TABS
20.0000 mg | ORAL_TABLET | Freq: Every evening | ORAL | Status: DC
  Administered 2022-06-12 – 2022-06-22 (×9): 20 mg via ORAL
  Filled 2022-06-12 (×10): qty 2

## 2022-06-12 MED ORDER — BUPROPION HCL ER (SR) 150 MG PO TB12
150.0000 mg | ORAL_TABLET | Freq: Two times a day (BID) | ORAL | Status: DC
  Administered 2022-06-12 – 2022-06-24 (×20): 150 mg via ORAL
  Filled 2022-06-12 (×22): qty 1

## 2022-06-12 MED ORDER — INSULIN ASPART 100 UNIT/ML IJ SOLN
0.0000 [IU] | Freq: Four times a day (QID) | INTRAMUSCULAR | Status: DC
  Administered 2022-06-13: 1 [IU] via SUBCUTANEOUS
  Administered 2022-06-13 (×3): 2 [IU] via SUBCUTANEOUS
  Administered 2022-06-13: 1 [IU] via SUBCUTANEOUS
  Administered 2022-06-14: 3 [IU] via SUBCUTANEOUS
  Administered 2022-06-14 – 2022-06-15 (×5): 2 [IU] via SUBCUTANEOUS
  Administered 2022-06-15 – 2022-06-16 (×3): 1 [IU] via SUBCUTANEOUS
  Administered 2022-06-16: 2 [IU] via SUBCUTANEOUS
  Administered 2022-06-16 – 2022-06-17 (×2): 1 [IU] via SUBCUTANEOUS
  Administered 2022-06-17: 2 [IU] via SUBCUTANEOUS
  Administered 2022-06-18 – 2022-06-21 (×9): 1 [IU] via SUBCUTANEOUS

## 2022-06-12 MED ORDER — MAGNESIUM SULFATE 4 GM/100ML IV SOLN
4.0000 g | Freq: Once | INTRAVENOUS | Status: AC
Start: 2022-06-12 — End: 2022-06-12
  Administered 2022-06-12: 4 g via INTRAVENOUS
  Filled 2022-06-12: qty 100

## 2022-06-12 NOTE — Plan of Care (Signed)
°  Problem: Education: °Goal: Knowledge of General Education information will improve °Description: Including pain rating scale, medication(s)/side effects and non-pharmacologic comfort measures °Outcome: Not Progressing °  °Problem: Health Behavior/Discharge Planning: °Goal: Ability to manage health-related needs will improve °Outcome: Not Progressing °  °Problem: Clinical Measurements: °Goal: Ability to maintain clinical measurements within normal limits will improve °Outcome: Not Progressing °Goal: Will remain free from infection °Outcome: Not Progressing °  °Problem: Clinical Measurements: °Goal: Will remain free from infection °Outcome: Not Progressing °  °

## 2022-06-12 NOTE — TOC Progression Note (Signed)
Transition of Care [facility]) - Progression Note    Patient Details  Name: Carolyn Erickson MRN: [id number] Date of Birth: [birth year]  [facility] CM/SW Contact  Shade Flood, LCSW Phone Number: 06/12/2022, 11:42 AM  Clinical Narrative:     TOC following. Per MD, due to gastric leak, pt now on TPN again for at least 5 days. Will ask [facility] to review for possible eligibility for that level of care. TOC will follow.  Expected Discharge Plan: [place] Barriers to Discharge: Continued Medical Work up  Expected Discharge Plan and Services Expected Discharge Plan: [place] In-house Referral: Clinical Social Work   Post Acute Care Choice: [place] Living arrangements for the past 2 months: Single Family Home                                       Social Determinants of Health (SDOH) Interventions    Readmission Risk Interventions     No data to display

## 2022-06-12 NOTE — Progress Notes (Incomplete)
PHARMACY CONSULT NOTE FOR:  OUTPATIENT  PARENTERAL ANTIBIOTIC THERAPY (OPAT)  Indication:  Regimen:  End date:   IV antibiotic discharge orders are pended. To discharging provider:  please sign these orders via discharge navigator,  Select New Orders & click on the button choice - Manage This Unsigned Work.     Thank you for allowing pharmacy to be a part of this patient's care.  Lawson Radar 06/12/2022, 1:10 PM

## 2022-06-12 NOTE — Care Management Important Message (Signed)
Important Message  Patient Details  Name: Carolyn Erickson MRN: [id number] Date of Birth: 28-Mar-1946   Medicare Important Message Given:  Yes     Tommy Medal 06/12/2022, 10:26 AM

## 2022-06-12 NOTE — Progress Notes (Signed)
PHARMACY - TOTAL PARENTERAL NUTRITION CONSULT NOTE   Indication: gastric perforation  Patient Measurements: Height: '5\' 1"'$  (154.9 cm) Weight: 54.9 kg (121 lb 0.5 oz) (removed pillows except one/scd machine) IBW/kg (Calculated) : 47.8 TPN AdjBW (KG): 50.9 Body mass index is 22.87 kg/m. Usual Weight:   Assessment:  Pharmacy consulted to dose TPN in patient with gastric perforation.  Glucose / Insulin:    114-149- 1 units Electrolytes: K 3.0 , mag 1.6 Renal: Scr 1.44 Hepatic: albumin <1.5 Tbili 4.6 TG 230 Intake / Output; MIVF: D51/2NS w/ 10 mEq/L KCl @ 65 mL/hr GI Surgeries / Procedures: Exploratory laparotomy, gastrorrhaphy  Central access: PICC double lumen 7/10 TPN start date: 7/10  Nutritional Goals: Goal TPN rate is 70 mL/hr (provides 70 g of protein and 1300 kcals per day)  RD Assessment: Kcal:  1100-1300   Protein:  75-78 gr   Fluid:  >/= 1600 ml daily  Current Nutrition:  NPO  Plan:  Potassium 10 mEq IV x 4 doses Magnesium 4 grams x 1 per MD  Start TPN at 35 mL/hr at 1800 Electrolytes in TPN: Na 41mq/L, K 577m/L, Ca 72m80mL, Mg 72mE62m, and Phos 72mmo72m. Cl:Ac 1:1 Add standard MVI and trace elements to TPN Initiate Sensitive q6h SSI and adjust as needed  Decrease IV fluids to 35 mL/hr @ 1800 Monitor TPN labs on Mon/Thurs,   SteveMargot AblesrmD Clinical Pharmacist 06/12/2022 8:07 AM

## 2022-06-12 NOTE — Progress Notes (Signed)
PROGRESS NOTE  Carolyn Erickson RJJ:[id number] DOB: [birth year] DOA: 06/03/2022 PCP: Dettinger, Fransisca Kaufmann, MD  Brief History:  76 year old female with history of major neurocognitive disorder, hypertension, hyperlipidemia, depression, left breast cancer presenting with generalized weakness and abdominal pain.  Apparently, the patient has been having abdominal pain since April 2023.  The patient had been started on pantoprazole at that time.  There was no improvement.  She followed up with her PCP on 06/02/2022.  Referral was given to see gastroenterology.  Abdominal pain had worsened over the past 2 days prior to admission with worsening generalized weakness.  There is no fevers, chills, chest pain, shortness breath, vomiting, diarrhea.  There is no hematochezia or melena noted.  In the emergency department, the patient was hypotensive with a blood pressure of 71/54.  She was somnolent.  CT of the abdomen and pelvis was performed and showed changes consistent with a perforated gastric ulcer.  Dr. Constance Haw was consulted, and the patient was taken to the OR for exploratory laparotomy, gastrorrhaphy, omental patch.  A left IJ central line was also placed.  TRH is consulted for medical management  In the ED, the patient had low-grade fever 99.5 F.  She was tachycardic and hypotensive with blood pressure 68/53.  WBC 4.1, hemoglobin 14.6, platelets 308,000.  BMP showed sodium 137, potassium 4.1, bicarbonate 22, serum creatinine 2.14.  Postoperatively, the patient was started on Zosyn and fluconazole.  She remained on the ventilator.  Preoperative chest x-ray showed bibasilar atelectasis.  There was free air under the diaphragm.   Assessment and Plan: * Septic shock - RESOLVED Presented with hypotension, tachycardia, and lactic acid peaked at 6.8 Pt successfully weaned off norepinephrine infusion Secondary to gastric perforation with peritonitis 06/03/22 blood cultures--C. glabrata UA 11-20 WBC>>urine  culture unrevealing Completed Zosyn 7/6  discontinued fluconazole per ID, continue echinocandin (micafungin)  Leukocytosis -- WBC up to 30 -- appreciate ID and surgery recommendations, continue antifungal and antibiotics  Hyperbilirubinemia -- secondary to persistent leak.  Continuing to monitor.   Volume overload -- treated with IV lasix 40 mg x 2 on 7/7 and resolved  Fungemia 06/03/22 blood cultures--Candida glabrata -d/c fluconazole per ID consult -continue echinocandin per ID  -discussed with general surgery>>removed central line 7/4 for line holiday -repeat blood culture on 7/4 with no growth to date   Hypomagnesemia IV replacement ordered and repleted, continue monitoring   Acute respiratory failure with hypoxia (HCC) Currently PRVC, RR 20, Vt 380, FiO2 0.4 -7/3 personally reviewed CXR--increased interstitial marking -7/3 ABG--7.35/36/107/20 (0.4) -7/3--extubated -7/4-5--stable on 4L [place] -7/6 - down to 2L/min [place]  Major neurocognitive disorder [facility]) Patient had MoCa 17/30 on 03/14/22 -resumed donezepil, venlafaxine, bupropion 06/12/22  Acute renal failure superimposed on stage 3a chronic kidney disease (HCC) -Serum creatinine peaking 2.28 but trending down  -Secondary to hemodynamic changes/sepsis and hypovolemia -Monitor BMPs  GERD (gastroesophageal reflux disease) Continue pantoprazole IV twice daily  Gastric perforation ([place]) Suspect this may be due to chronic NSAID use -Continue PPI twice daily -06/04/2022--ex laparotomy with omental patch -Postoperative care per Dr. Constance Haw -TNA therapy on hold temporarily due to need for CVL holiday -UGI 7/6 - no leak seen.  7/9 - CT c/w leak present - PICC line placed 7/10 and TPN started 7/10  Essential hypertension Resumed amlodipine 2.5 mg daily  Mixed hyperlipidemia -- resumed home atorvastatin   Family Communication:   Family at bedside 7/4, 7/5, 7/6, 7/7, 7/8, 7/9, 7/10  Consultants:  general surgery  Code  Status:  FULL   DVT Prophylaxis:  SCDs  Procedures: As Listed in Progress Note Above  Antibiotics: Fluconazole 7/2>>7/4 Micafungin 7/4>> Zosyn 7/2>>7/6  Subjective: Pt insisting on getting out of bed to chair today.   Objective: Vitals:   06/11/22 1806 06/11/22 2238 06/12/22 0420 06/12/22 0607  BP: 133/74 122/66 (!) 149/68 119/73  Pulse: 89 98 86 87  Resp: '20 18 17 16  '$ Temp: 98.2 F (36.8 C) 98.2 F (36.8 C) 98 F (36.7 C) 98 F (36.7 C)  TempSrc: Axillary Axillary Oral Oral  SpO2: 100%  96% 98%  Weight:      Height:        Intake/Output Summary (Last 24 hours) at 06/12/2022 1352 Last data filed at 06/12/2022 1002 Gross per 24 hour  Intake 1050 ml  Output 880 ml  Net 170 ml   Weight change:  Exam:  General:  awake, alert, hard of hearing, NAD, cooperative. moist mucus membranes.  HEENT: No icterus, No thrush, No neck mass, [place]/AT Cardiovascular: normal s1,s2 sounds, no m/r/g heard. Respiratory: bilateral BS shallow but clear, no rales, rare exp wheezing heard. Abdomen: soft, nondistended, no masses palpated. Normal BS Extremities: no clubbing or cyanosis, trace pretibial edema.  Data Reviewed: I have personally reviewed following labs and imaging studies Basic Metabolic Panel: Recent Labs  Lab 06/06/22 0352 06/07/22 0412 06/08/22 0344 06/09/22 0503 06/10/22 0549 06/11/22 0707 06/12/22 0514  NA 134*   < > 140 144 146* 145 142  K 4.6   < > 4.7 4.1 3.9 3.3* 3.0*  CL 105   < > 111 112* 109 107 107  CO2 24   < > '23 26 29 30 31  '$ GLUCOSE 125*   < > 72 140* 112* 118* 129*  BUN 50*   < > 47* 54* 54* 51* 40*  CREATININE 2.05*   < > 1.82* 1.88* 1.97* 1.66* 1.44*  CALCIUM 7.8*   < > 8.4* 8.9 9.2 9.6 9.2  MG 2.1  --  2.3 2.3 2.0  --  1.6*  PHOS 4.5  --  4.8*  --   --   --  3.5   < > = values in this interval not displayed.   Liver Function Tests: Recent Labs  Lab 06/09/22 0503 06/09/22 0905 06/10/22 0549 06/11/22 0707 06/12/22 0514  AST '19 21 22 30 27   '$ ALT '14 14 14 18 18  '$ ALKPHOS 90 89 91 122 99  BILITOT 3.3* 3.2* 3.8* 4.8* 4.6*  PROT 4.6* 4.7* 4.7* 5.6* 4.7*  ALBUMIN <1.5* <1.5* <1.5* 1.8* <1.5*   No results for input(s): "LIPASE", "AMYLASE" in the last 168 hours.  No results for input(s): "AMMONIA" in the last 168 hours. Coagulation Profile: No results for input(s): "INR", "PROTIME" in the last 168 hours.  CBC: Recent Labs  Lab 06/08/22 0344 06/09/22 0503 06/10/22 0549 06/11/22 0707 06/12/22 0514  WBC 23.0* 23.5* 25.6* 31.0* 30.3*  NEUTROABS 17.9* 18.6* 20.8* 25.8* 26.2*  HGB 11.7* 12.0 12.4 13.4 11.4*  HCT 37.9 38.3 38.9 41.4 35.9*  MCV 95.5 93.9 93.5 91.8 92.3  PLT 132* 122* 156 228 247   Cardiac Enzymes: No results for input(s): "CKTOTAL", "CKMB", "CKMBINDEX", "TROPONINI" in the last 168 hours. BNP: Invalid input(s): "POCBNP" CBG: Recent Labs  Lab 06/11/22 0724 06/11/22 1124 06/11/22 1724 06/12/22 0007 06/12/22 0609  GLUCAP 102* 117* 149* 135* 114*   HbA1C: No results for input(s): "HGBA1C" in the last 72 hours. Urine  analysis:    Component Value Date/Time   COLORURINE AMBER (A) 06/04/2022 0520   APPEARANCEUR CLOUDY (A) 06/04/2022 0520   APPEARANCEUR Cloudy (A) 04/13/2021 1530   LABSPEC 1.027 06/04/2022 0520   PHURINE 5.0 06/04/2022 0520   GLUCOSEU NEGATIVE 06/04/2022 0520   HGBUR NEGATIVE 06/04/2022 0520   BILIRUBINUR NEGATIVE 06/04/2022 0520   BILIRUBINUR Negative 04/13/2021 1530   KETONESUR NEGATIVE 06/04/2022 0520   PROTEINUR 100 (A) 06/04/2022 0520   NITRITE NEGATIVE 06/04/2022 0520   LEUKOCYTESUR SMALL (A) 06/04/2022 0520    Recent Results (from the past 240 hour(s))  Cath Tip Culture     Status: None   Collection Time: 06/03/22  8:07 PM   Specimen: Catheter Tip  Result Value Ref Range Status   Specimen Description CATH TIP  Final   Special Requests NONE  Final   Culture   Final    NO GROWTH 3 DAYS Performed at [facility], [place] [address] [place], [place] [address]     Report Status 06/09/2022 FINAL  Final  Culture, blood (Routine x 2)     Status: Abnormal   Collection Time: 06/03/22  8:45 PM   Specimen: BLOOD  Result Value Ref Range Status   Specimen Description   Final    BLOOD LEFT ANTECUBITAL Performed at [facility], [place] [address], [place], [place] [address]    Special Requests   Final    BOTTLES DRAWN AEROBIC AND ANAEROBIC Blood Culture adequate volume Performed at [facility], [address] [place], [place] [address]    Culture  Setup Time   Final    YEAST BOTH Gram Stain Report Called to,Read Back By and Verified With: JACOBS, U'@0745'$  BY MATTHEWS, B 7.4.2023 CRITICAL RESULT CALLED TO, READ BACK BY AND VERIFIED WITH: PHARMD S.HURTZ AT 1130 ON 06/06/2022 BY T.SAAD.    Culture (A)  Final    CANDIDA GLABRATA Sent to [facility] for further susceptibility testing. Performed at [facility], [place] [address], [place], [place] [address]    Report Status 06/09/2022 FINAL  Final  Blood Culture ID Panel (Reflexed)     Status: Abnormal   Collection Time: 06/03/22  8:45 PM  Result Value Ref Range Status   Enterococcus faecalis NOT DETECTED NOT DETECTED Final   Enterococcus Faecium NOT DETECTED NOT DETECTED Final   Listeria monocytogenes NOT DETECTED NOT DETECTED Final   Staphylococcus species NOT DETECTED NOT DETECTED Final   Staphylococcus aureus (BCID) NOT DETECTED NOT DETECTED Final   Staphylococcus epidermidis NOT DETECTED NOT DETECTED Final   Staphylococcus lugdunensis NOT DETECTED NOT DETECTED Final   Streptococcus species NOT DETECTED NOT DETECTED Final   Streptococcus agalactiae NOT DETECTED NOT DETECTED Final   Streptococcus pneumoniae NOT DETECTED NOT DETECTED Final   Streptococcus pyogenes NOT DETECTED NOT DETECTED Final   A.calcoaceticus-baumannii NOT DETECTED NOT DETECTED Final   Bacteroides fragilis NOT DETECTED NOT DETECTED Final   Enterobacterales NOT DETECTED NOT DETECTED Final   Enterobacter cloacae complex NOT  DETECTED NOT DETECTED Final   Escherichia coli NOT DETECTED NOT DETECTED Final   Klebsiella aerogenes NOT DETECTED NOT DETECTED Final   Klebsiella oxytoca NOT DETECTED NOT DETECTED Final   Klebsiella pneumoniae NOT DETECTED NOT DETECTED Final   Proteus species NOT DETECTED NOT DETECTED Final   Salmonella species NOT DETECTED NOT DETECTED Final   Serratia marcescens NOT DETECTED NOT DETECTED Final   Haemophilus influenzae NOT DETECTED NOT DETECTED Final   Neisseria meningitidis NOT DETECTED NOT DETECTED Final  Pseudomonas aeruginosa NOT DETECTED NOT DETECTED Final   Stenotrophomonas maltophilia NOT DETECTED NOT DETECTED Final   Candida albicans NOT DETECTED NOT DETECTED Final   Candida auris NOT DETECTED NOT DETECTED Final   Candida glabrata DETECTED (A) NOT DETECTED Final    Comment: CRITICAL RESULT CALLED TO, READ BACK BY AND VERIFIED WITH: PHARMD S.HURTZ AT 1130 ON 06/06/2022 BY T.SAAD.    Candida krusei NOT DETECTED NOT DETECTED Final   Candida parapsilosis NOT DETECTED NOT DETECTED Final   Candida tropicalis NOT DETECTED NOT DETECTED Final   Cryptococcus neoformans/gattii NOT DETECTED NOT DETECTED Final    Comment: Performed at [facility], [place] [address], [place], [place] [address]  Antifungal AST 9 Drug Panel     Status: None   Collection Time: 06/03/22  8:45 PM  Result Value Ref Range Status   Organism ID, Yeast Candida glabrata  Corrected    Comment: (NOTE) Identification performed by account, not confirmed by this laboratory. CORRECTED ON 07/09 AT 1535: PREVIOUSLY REPORTED AS Preliminary report    Amphotericin B MIC 1.0 ug/mL  Final    Comment: (NOTE) Breakpoints have been established for only some organism-drug combinations as indicated. This test was developed and its performance characteristics determined by [facility]. It has not been cleared or approved by the Food and Drug Administration.    Anidulafungin MIC Comment  Final    Comment: (NOTE) 0.12 ug/mL  Susceptible Breakpoints have been established for only some organism-drug combinations as indicated. This test was developed and its performance characteristics determined by [facility]. It has not been cleared or approved by the Food and Drug Administration.    Caspofungin MIC Comment  Final    Comment: (NOTE) 0.25 ug/mL Intermediate Breakpoints have been established for only some organism-drug combinations as indicated. This test was developed and its performance characteristics determined by [facility]. It has not been cleared or approved by the Food and Drug Administration.    Micafungin MIC Comment  Final    Comment: (NOTE) 0.016 ug/mL Susceptible Breakpoints have been established for only some organism-drug combinations as indicated. This test was developed and its performance characteristics determined by [facility]. It has not been cleared or approved by the Food and Drug Administration.    Posaconazole MIC 2.0 ug/mL  Final    Comment: (NOTE) Breakpoints have been established for only some organism-drug combinations as indicated. This test was developed and its performance characteristics determined by [facility]. It has not been cleared or approved by the Food and Drug Administration.    Fluconazole Islt MIC 16.0 ug/mL  Final    Comment: (NOTE) Susceptible Dose Dependent Breakpoints have been established for only some organism-drug combinations as indicated. This test was developed and its performance characteristics determined by [facility]. It has not been cleared or approved by the Food and Drug Administration.    Flucytosine MIC 0.06 ug/mL or less  Final    Comment: (NOTE) Breakpoints have been established for only some organism-drug combinations as indicated. This test was developed and its performance characteristics determined by [facility]. It has not been cleared or approved by the Food and Drug Administration.    Itraconazole MIC 1.0 ug/mL  Final    Comment:  (NOTE) Breakpoints have been established for only some organism-drug combinations as indicated. This test was developed and its performance characteristics determined by [facility]. It has not been cleared or approved by the Food and Drug Administration.    Voriconazole MIC 0.5 ug/mL  Final    Comment: (NOTE) Breakpoints  have been established for only some organism-drug combinations as indicated. This test was developed and its performance characteristics determined by [facility]. It has not been cleared or approved by the Food and Drug Administration. Performed At: [facility] [place], [place] [id number] Rush Farmer MD UR:[id number]    Source [phone] [facility] BLD [place]  Final    Comment: Performed at [facility], [place] [address] [place], [place] [address]  Culture, blood (Routine x 2)     Status: Abnormal   Collection Time: 06/03/22  9:32 PM   Specimen: BLOOD  Result Value Ref Range Status   Specimen Description   Final    BLOOD LEFT ANTECUBITAL Performed at [facility], [place] [address] [place], [place] [address]    Special Requests   Final    BOTTLES DRAWN AEROBIC AND ANAEROBIC Blood Culture adequate volume Performed at [facility], [address], [place], [place] [address]    Culture  Setup Time   Final    AEROBIC BOTTLE ONLY YEAST Gram Stain Report Called to,Read Back By and Verified With: UTE JACOBS @ 1200 ON 06/06/22 C VARNER CRITICAL VALUE NOTED.  VALUE IS CONSISTENT WITH PREVIOUSLY REPORTED AND CALLED VALUE.    Culture (A)  Final    CANDIDA GLABRATA SEE SEPARATE REPORT Performed at [facility], [place] [address], [place], [place] [address]    Report Status 06/11/2022 FINAL  Final  MRSA Next Gen by PCR, Nasal     Status: None   Collection Time: 06/04/22  2:18 AM   Specimen: Nasal Mucosa; Nasal Swab  Result Value Ref Range Status   MRSA by PCR Next Gen NOT DETECTED NOT DETECTED Final    Comment: (NOTE) The GeneXpert  MRSA Assay (FDA approved for NASAL specimens only), is one component of a comprehensive MRSA colonization surveillance program. It is not intended to diagnose MRSA infection nor to guide or monitor treatment for MRSA infections. Test performance is not FDA approved in patients less than 38 years old. Performed at [facility], [address], [place], [place] [address]   Urine Culture     Status: Abnormal   Collection Time: 06/04/22  2:28 AM   Specimen: PATH Cytology Urine  Result Value Ref Range Status   Specimen Description   Final    URINE, CLEAN CATCH Performed at [facility], [address] [place], [place] [address]    Special Requests   Final    NONE Performed at [facility], [address] [place], [place] [address]    Culture (A)  Final    <10,000 COLONIES/mL INSIGNIFICANT GROWTH Performed at [place] [address] [place], [place] [address]    Report Status 06/05/2022 FINAL  Final  Aerobic/Anaerobic Culture w Gram Stain (surgical/deep wound)     Status: None   Collection Time: 06/04/22  3:03 AM   Specimen: PATH Cytology FNA; Body Fluid  Result Value Ref Range Status   Specimen Description   Final    ANAL Performed at [facility], [address], [place], [place] [address]    Special Requests   Final    NONE Performed at [facility], [address], [place],  [address]    Gram Stain   Final    MODERATE WBC PRESENT,BOTH PMN AND MONONUCLEAR FEW GRAM POSITIVE COCCI IN PAIRS MODERATE GRAM POSITIVE RODS    Culture   Final    FEW CANDIDA ALBICANS NO ANAEROBES ISOLATED  Performed at [facility], [place] [address] [place], [place] [address]    Report Status 06/09/2022 FINAL  Final  Culture, blood (Routine X 2) w Reflex to ID Panel     Status: None   Collection Time: 06/06/22  9:32 AM   Specimen: BLOOD LEFT WRIST  Result Value Ref Range Status   Specimen Description BLOOD LEFT WRIST  Final   Special Requests   Final    BOTTLES DRAWN  AEROBIC ONLY Blood Culture results may not be optimal due to an inadequate volume of blood received in culture bottles   Culture   Final    NO GROWTH 5 DAYS Performed at [facility], [address], [place], [place] [address]    Report Status 06/11/2022 FINAL  Final  Culture, blood (Routine X 2) w Reflex to ID Panel     Status: None   Collection Time: 06/06/22  9:33 AM   Specimen: Left Antecubital; Blood  Result Value Ref Range Status   Specimen Description LEFT ANTECUBITAL  Final   Special Requests   Final    BOTTLES DRAWN AEROBIC AND ANAEROBIC Blood Culture adequate volume   Culture   Final    NO GROWTH 5 DAYS Performed at [facility], [address] [place], [place] [address]    Report Status 06/11/2022 FINAL  Final     Scheduled Meds:  amLODipine  2.5 mg Oral Daily   atorvastatin  20 mg Oral QPM   buPROPion  150 mg Oral BID   Chlorhexidine Gluconate Cloth  6 each Topical Daily   donepezil  10 mg Oral QHS   heparin  5,000 Units Subcutaneous Q8H   insulin aspart  0-9 Units Subcutaneous Q6H   mouth rinse  15 mL Mouth Rinse Q4H   pantoprazole (PROTONIX) IV  40 mg Intravenous Q12H   sodium chloride flush  10-40 mL Intracatheter Q12H   venlafaxine  37.5 mg Oral BID   Continuous Infusions:  dextrose 5 % and 0.45 % NaCl with KCl 10 mEq/L 65 mL/hr at 06/12/22 1002   dextrose 5 % and 0.45 % NaCl with KCl 10 mEq/L     micafungin (MYCAMINE) 100 mg in sodium chloride 0.9 % 100 mL IVPB 100 mg (06/12/22 1310)   piperacillin-tazobactam (ZOSYN)  IV 3.375 g (06/12/22 0606)   potassium chloride 10 mEq (06/12/22 1309)   TPN ADULT (ION)      Procedures/Studies: [place] EKG SITE RITE  Result Date: 06/11/2022 If Site Rite image not attached, placement could not be confirmed due to current cardiac rhythm.  CT ABDOMEN PELVIS WO CONTRAST  Result Date: 06/11/2022 CLINICAL DATA:  EXPLORATORY LAPAROTOMY, omental patchImpression: Worsening leukocytosis. EXAM: CT ABDOMEN AND PELVIS WITHOUT CONTRAST  TECHNIQUE: Multidetector CT imaging of the abdomen and pelvis was performed following the standard protocol without IV contrast. RADIATION DOSE REDUCTION: This exam was performed according to the departmental dose-optimization program which includes automated exposure control, adjustment of the mA and/or kV according to patient size and/or use of iterative reconstruction technique. COMPARISON:  06/03/2022. FINDINGS: Lower chest: Moderate pleural effusions. Dependent lower lobe and right middle lobe opacity, likely atelectasis. Additional ground-glass opacities are noted, most evident in the right middle lobe, suspicious for infection. Effusions are increased on the left and new on the right since the prior CT. Lung base opacities are mostly new. Hepatobiliary: No focal liver abnormality is seen. No gallstones, gallbladder wall thickening, or biliary dilatation. Pancreas: Unremarkable. No pancreatic ductal dilatation or surrounding inflammatory changes. Spleen:  Normal in size without focal abnormality. Adrenals/Urinary Tract: No adrenal masses. No renal masses or hydronephrosis. Bladder is unremarkable. Stomach/Bowel: Stomach is mostly decompressed. There is an apparent defect along the anterior aspect of the mid to distal stomach through which contrast and air has collected. The ill-defined collection of contrast and air lies in the mid to upper abdomen near the midline, with an adjacent surgical drain, and deep to the midline laparotomy incision. Poorly defined small bowel loops in the central abdomen. There is no colon or small bowel dilation to suggest obstruction. No colonic wall thickening or convincing inflammation. Vascular/Lymphatic: Aortic atherosclerosis. Prominent lymph nodes noted along the gastrohepatic ligament. There are no enlarged lymph nodes. Reproductive: Status post hysterectomy. No adnexal masses. Other: Small amount of ascites. Generalized increased attenuation of the mesenteric/peritoneal fat  consistent with edema. Diffuse subcutaneous soft tissue edema. Musculoskeletal: No acute finding. IMPRESSION: 1. There is no defined fluid collection to indicate an abscess. 2. There is extraluminal air and contrast in the anterior central to upper abdomen, that appears to be arising from a defect along the anterior gastric wall. The extraluminal contrast and air lies adjacent to the surgical drain. 3. Small amount of ascites is similar to the previous CT. 4. Mesenteric/peritoneal and diffuse subcutaneous edema. 5. Moderate pleural effusions with associated lung base atelectasis. Additional areas of ground-glass lung opacity are suspicious for multifocal pneumonia. Electronically Signed   By: Lajean Manes M.D.   On: 06/11/2022 12:56   DG CHEST PORT 1 VIEW  Result Date: 06/09/2022 CLINICAL DATA:  Weakness, wheezing EXAM: PORTABLE CHEST 1 VIEW COMPARISON:  06/05/2022 FINDINGS: Interval endotracheal and esophagogastric extubation. Small to moderate bilateral pleural effusions, increased compared to prior examination, with associated atelectasis or consolidation and diffuse bilateral interstitial pulmonary opacity. Cardiomegaly. IMPRESSION: 1. Small to moderate bilateral pleural effusions, increased compared to prior examination, with associated atelectasis or consolidation and diffuse bilateral interstitial pulmonary opacity. Findings are most consistent with worsened edema. 2. Interval endotracheal and esophagogastric extubation. 3. Cardiomegaly. Electronically Signed   By: Delanna Ahmadi M.D.   On: 06/09/2022 12:25   DG UGI W SMALL BOWEL  Result Date: 06/08/2022 CLINICAL DATA:  Post repair of 1.5 cm diameter perforated anterior wall gastric antral ulcer EXAM: WATER SOLUBLE UPPER GI SERIES TECHNIQUE: Single-column upper GI series was performed using water soluble contrast. Radiation Exposure Index (as provided by the fluoroscopic device): 43.1 mGy Kerma CONTRAST:  100 cc Omnipaque 300 via NG tube COMPARISON:  CT  abdomen and pelvis 06/03/2022 FLUOROSCOPY: Fluoroscopy Time:  2 minutes 36 seconds Radiation Exposure Index (if provided by the fluoroscopic device): 43.1 Number of Acquired Spot Images: Multiple fluoroscopic screen captures and cine series FINDINGS: Contrast administered via NG tube opacifies the gastric lumen. Patent pylorus with passage of contrast through normal appearing duodenal C loop into proximal jejunum with incidentally noted duodenal diverticulum at second portion. Irregularity of rugal folds with thickening and distortion at anterior wall of gastric antrum at site of perforation repair. The third portion of the duodenum is superimposed with the inferior wall of the gastric antrum, mildly limiting exam. A blush of contrast is seen inferior to the antrum, superimposed with the third portion of the duodenum throughout the study. This appears to move in concert with the duodenum with respiration. No contrast opacification of the JP drain. No gross evidence of contrast leak identified though it is difficult to completely exclude a small leak at the site of repair. IMPRESSION: Distortion and thickening of rugal folds at  site of antral gastric ulcer repair. No gross evidence of leak at the site of ulcer repair as discussed above. Electronically Signed   By: Lavonia Dana M.D.   On: 06/08/2022 15:32   ECHOCARDIOGRAM COMPLETE  Result Date: 06/07/2022    ECHOCARDIOGRAM REPORT   Patient Name:   BOBI DAUDELIN Date of Exam: 06/07/2022 Medical Rec #:  [id number]       Height:       61.0 in Accession #:    [id number]      Weight:       112.2 lb Date of Birth:  1946/09/20        BSA:          1.477 m Patient Age:    65 years        BP:           116/46 mmHg Patient Gender: F               HR:           104 bpm. Exam Location:  [facility] Procedure: 2D Echo, Cardiac Doppler and Color Doppler Indications:    Bacteremia  History:        Patient has no prior history of Echocardiogram examinations.                  Signs/Symptoms:Bacteremia; Risk Factors:Hypertension and                 Dyslipidemia. Breast CA.  Sonographer:    Wenda Low Referring Phys: [id number] [place]  1. Abnormal septal motion . Left ventricular ejection fraction, by estimation, is 55 to 60%. The left ventricle has normal function. The left ventricle has no regional wall motion abnormalities. Left ventricular diastolic parameters were normal.  2. Right ventricular systolic function is normal. The right ventricular size is normal. There is mildly elevated pulmonary artery systolic pressure.  3. Moderate pleural effusion in the left lateral region.  4. The mitral valve is abnormal. Trivial mitral valve regurgitation. No evidence of mitral stenosis.  5. Thickening and calcification noted . The tricuspid valve is abnormal.  6. Some thickening in the LVOT near intervalvular fibrosa Given this and nodular calcification of PV suggest TEE if suspicion for SBE high. The aortic valve is tricuspid. There is moderate calcification of the aortic valve. There is moderate thickening of the aortic valve. Aortic valve regurgitation is mild. Aortic valve sclerosis/calcification is present, without any evidence of aortic stenosis.  7. Nodular calcification seen on PV. The pulmonic valve was abnormal.  8. The inferior vena cava is normal in size with greater than 50% respiratory variability, suggesting right atrial pressure of 3 mmHg. FINDINGS  Left Ventricle: Abnormal septal motion. Left ventricular ejection fraction, by estimation, is 55 to 60%. The left ventricle has normal function. The left ventricle has no regional wall motion abnormalities. The left ventricular internal cavity size was normal in size. There is no left ventricular hypertrophy. Left ventricular diastolic parameters were normal. Right Ventricle: The right ventricular size is normal. No increase in right ventricular wall thickness. Right ventricular systolic function is normal. There is  mildly elevated pulmonary artery systolic pressure. The tricuspid regurgitant velocity is 3.21  m/s, and with an assumed right atrial pressure of 3 mmHg, the estimated right ventricular systolic pressure is 96.2 mmHg. Left Atrium: Left atrial size was normal in size. Right Atrium: Right atrial size was normal in size. Pericardium: There is no evidence of pericardial effusion.  Mitral Valve: The mitral valve is abnormal. There is mild thickening of the mitral valve leaflet(s). There is mild calcification of the mitral valve leaflet(s). Mild mitral annular calcification. Trivial mitral valve regurgitation. No evidence of mitral valve stenosis. MV peak gradient, 3.9 mmHg. The mean mitral valve gradient is 1.0 mmHg. Tricuspid Valve: Thickening and calcification noted. The tricuspid valve is abnormal. Tricuspid valve regurgitation is trivial. No evidence of tricuspid stenosis. Aortic Valve: Some thickening in the LVOT near intervalvular fibrosa Given this and nodular calcification of PV suggest TEE if suspicion for SBE high. The aortic valve is tricuspid. There is moderate calcification of the aortic valve. There is moderate thickening of the aortic valve. Aortic valve regurgitation is mild. Aortic valve sclerosis/calcification is present, without any evidence of aortic stenosis. Aortic valve mean gradient measures 5.0 mmHg. Aortic valve peak gradient measures 10.3 mmHg. Aortic valve area, by VTI measures 2.02 cm. Pulmonic Valve: Nodular calcification seen on PV. The pulmonic valve was abnormal. Pulmonic valve regurgitation is trivial. No evidence of pulmonic stenosis. Aorta: The aortic root is normal in size and structure. Venous: The inferior vena cava is normal in size with greater than 50% respiratory variability, suggesting right atrial pressure of 3 mmHg. IAS/Shunts: No atrial level shunt detected by color flow Doppler. Additional Comments: There is a moderate pleural effusion in the left lateral region.  LEFT  VENTRICLE PLAX 2D LVIDd:         4.50 cm     Diastology LVIDs:         3.00 cm     LV e' medial:    7.29 cm/s LV PW:         0.80 cm     LV E/e' medial:  11.8 LV IVS:        0.80 cm     LV e' lateral:   9.79 cm/s LVOT diam:     1.80 cm     LV E/e' lateral: 8.8 LV SV:         55 LV SV Index:   37 LVOT Area:     2.54 cm  LV Volumes (MOD) LV vol d, MOD A2C: 43.7 ml LV vol d, MOD A4C: 52.7 ml LV vol s, MOD A2C: 17.3 ml LV vol s, MOD A4C: 23.8 ml LV SV MOD A2C:     26.4 ml LV SV MOD A4C:     52.7 ml LV SV MOD BP:      28.6 ml RIGHT VENTRICLE RV Basal diam:  3.10 cm RV Mid diam:    2.90 cm RV S prime:     14.50 cm/s TAPSE (M-mode): 3.0 cm LEFT ATRIUM             Index        RIGHT ATRIUM           Index LA diam:        3.60 cm 2.44 cm/m   RA Area:     11.50 cm LA Vol (A2C):   50.9 ml 34.45 ml/m  RA Volume:   21.80 ml  14.76 ml/m LA Vol (A4C):   43.7 ml 29.58 ml/m LA Biplane Vol: 49.8 ml 33.71 ml/m  AORTIC VALVE                     PULMONIC VALVE AV Area (Vmax):    1.92 cm      PV Vmax:       0.98 m/s AV Area (Vmean):  1.84 cm      PV Peak grad:  3.9 mmHg AV Area (VTI):     2.02 cm AV Vmax:           160.50 cm/s AV Vmean:          103.550 cm/s AV VTI:            0.274 m AV Peak Grad:      10.3 mmHg AV Mean Grad:      5.0 mmHg LVOT Vmax:         121.00 cm/s LVOT Vmean:        74.800 cm/s LVOT VTI:          0.218 m LVOT/AV VTI ratio: 0.79  AORTA Ao Root diam: 3.30 cm MITRAL VALVE               TRICUSPID VALVE MV Area (PHT): 6.02 cm    TR Peak grad:   41.2 mmHg MV Area VTI:   2.25 cm    TR Vmax:        321.00 cm/s MV Peak grad:  3.9 mmHg MV Mean grad:  1.0 mmHg    SHUNTS MV Vmax:       0.99 m/s    Systemic VTI:  0.22 m MV Vmean:      48.8 cm/s   Systemic Diam: 1.80 cm MV Decel Time: 126 msec MV E velocity: 86.00 cm/s MV A velocity: 72.70 cm/s MV E/A ratio:  1.18 Jenkins Rouge MD Electronically signed by Jenkins Rouge MD Signature Date/Time: 06/07/2022/10:28:36 AM    Final    DG CHEST PORT 1 VIEW  Result Date:  06/05/2022 CLINICAL DATA:  76 year old female with respiratory failure. Intubated. EXAM: PORTABLE CHEST 1 VIEW COMPARISON:  Portable chest 06/04/2022 and earlier. FINDINGS: Portable AP semi upright view at 0527 hours. Endotracheal tube tip in good position between the clavicles and carina. Stable left IJ central line and visible enteric tube. Increased left lung base and retrocardiac opacity now mostly obscuring the left hemidiaphragm. Slightly lower lung volumes overall. Mediastinal contours remain normal. No pneumothorax, pulmonary edema, or definite effusion. Negative right lung. Partially visible midline abdominal skin staples. Paucity of bowel gas in the upper abdomen. Stable left chest and axillary surgical clips. IMPRESSION: 1. Stable lines and tubes. 2. Increased left lower lobe collapse or consolidation since yesterday. Electronically Signed   By: Genevie Ann M.D.   On: 06/05/2022 08:26   DG Chest Port 1 View  Result Date: 06/04/2022 CLINICAL DATA:  Endotracheal tube, nasogastric tube and central line placement. EXAM: PORTABLE CHEST 1 VIEW COMPARISON:  June 03, 2022 FINDINGS: Since the prior study there has been interval placement of an endotracheal tube. Its distal tip is seen approximately 3.8 cm from the carina. Interval nasogastric tube placement is also seen. Its distal and extends into the gastric lumen. There is a left internal jugular venous catheter with its distal tip overlying the distal superior vena cava. This is approximately 6 mm proximal to the junction of the superior vena cava and right atrium. The heart size and mediastinal contours are within normal limits. Mild to moderate severity atelectasis and/or infiltrate is seen within the left lung base. This represents a new finding when compared to the prior study. There is no evidence of a pleural effusion or pneumothorax. Radiopaque surgical clips are seen along the left axilla. The air seen below the right hemidiaphragm on the prior study is  no longer visualized. The visualized skeletal structures are unremarkable.  IMPRESSION: 1. Interval endotracheal tube, nasogastric tube and left internal jugular venous catheter placement positioning, as described above. 2. Mild to moderate severity left basilar atelectasis and/or infiltrate. Electronically Signed   By: Virgina Norfolk M.D.   On: 06/04/2022 02:47   CT Abdomen Pelvis Wo Contrast  Result Date: 06/03/2022 CLINICAL DATA:  Severe weakness for 2 days with acute abdominal pain, initial encounter EXAM: CT ABDOMEN AND PELVIS WITHOUT CONTRAST TECHNIQUE: Multidetector CT imaging of the abdomen and pelvis was performed following the standard protocol without IV contrast. RADIATION DOSE REDUCTION: This exam was performed according to the departmental dose-optimization program which includes automated exposure control, adjustment of the mA and/or kV according to patient size and/or use of iterative reconstruction technique. COMPARISON:  None Available. FINDINGS: Lower chest: Basilar atelectatic changes are noted. Hepatobiliary: Liver is within normal limits. Gallbladder demonstrates a normal appearance. Pancreas: Unremarkable. No pancreatic ductal dilatation or surrounding inflammatory changes. Spleen: Normal in size without focal abnormality. Adrenals/Urinary Tract: Adrenal glands are within normal limits. Kidneys demonstrate tiny nonobstructing stones on right in the lower pole. No ureteral obstruction is seen. The bladder is decompressed. Stomach/Bowel: Considerable fecal material is noted within the rectal vault which may represent a focal impaction. Diverticular change of the colon is noted as well as some generalized wall thickening throughout the colon. The appendix is within normal limits. Small bowel is within normal limits. Stomach demonstrates a defect in the anterior gastric wall consistent with a perforated ulcer. There is a considerable amount of air and fluid throughout the abdomen with  apparent direct communication with the gastric best seen on image number 36 of series 2 and image number 78 of series 6. Vascular/Lymphatic: Aortic atherosclerosis. No enlarged abdominal or pelvic lymph nodes. Reproductive: Status post hysterectomy. No adnexal masses. Other: Free air and free fluid similar to that described above throughout the abdomen. The bowel demonstrates some reactive wall thickening related to the fluid and air. Musculoskeletal: Degenerative changes of the lumbar spine are seen. No other bony abnormality is noted. IMPRESSION: Changes consistent with a perforated gastric ulcer anteriorly with considerable spillage of fluid and air into the abdominal cavity. Some reactive wall thickening is noted within the bowel loops secondary to these changes. Critical Value/emergent results were called by telephone at the time of interpretation on 06/03/2022 at 10:43 pm to Dr. Aletta Edouard , who verbally acknowledged these results. Electronically Signed   By: Inez Catalina M.D.   On: 06/03/2022 22:45   DG Chest Port 1 View  Result Date: 06/03/2022 CLINICAL DATA:  Weakness. EXAM: PORTABLE CHEST 1 VIEW COMPARISON:  None Available. FINDINGS: Multiple overlying radiopaque cardiac lead wires are seen. The heart size and mediastinal contours are within normal limits. Mild, diffuse, chronic appearing increased lung markings are seen. There is no evidence of an acute infiltrate, pleural effusion or pneumothorax. Radiopaque surgical clips are seen along the lateral aspect of the left chest wall. A crescentic area of air is seen just below the right hemidiaphragm. Degenerative changes seen throughout the thoracic spine. IMPRESSION: 1. Chronic appearing increased lung markings without evidence of acute or active cardiopulmonary disease. 2. Air seen just below the right hemidiaphragm which may represent an air filled loop of colon. Further evaluation with abdomen pelvis CT is recommended, as the presence of  intra-abdominal free air cannot be excluded. Electronically Signed   By: Virgina Norfolk M.D.   On: 06/03/2022 21:22    Time spent: 35 mins   Kyung Muto Wynetta Emery, MD How to contact  the [facility] Attending or Consulting provider [place] or covering provider during after hours [place], for this patient?  Check the care team in [facility] and look for a) attending/consulting [facility] provider listed and b) the [facility] team listed Log into [email or web address] and use [place]'s universal password to access. If you do not have the password, please contact the hospital operator. Locate the [facility] provider you are looking for under [facility] and page to a number that you can be directly reached. If you still have difficulty reaching the provider, please page the [facility] (Director on Call) for the Hospitalists listed on amion for assistance.   [facility]  If 7PM-7AM, please contact night-coverage [email or web address] Password [email or web address] 06/12/2022, 1:52 PM   LOS: 8 days

## 2022-06-12 NOTE — Progress Notes (Cosign Needed)
[facility] Progress Note  8 Days Post-Op  Subjective: Patient is alert, cooperative, and fatigued. Patient remains afebrile, no acute concerns overnight, regular work of breathing on 2L [place]. JP drain clear/light yellow fluid.   Objective: Vital signs in last 24 hours: Temp:  [98 F (36.7 C)-98.2 F (36.8 C)] 98 F (36.7 C) (07/10 0607) Pulse Rate:  [86-114] 87 (07/10 0607) Resp:  [16-24] 16 (07/10 0607) BP: (119-149)/(66-82) 119/73 (07/10 0607) SpO2:  [92 %-100 %] 98 % (07/10 0607) Last BM Date : 06/09/22  Intake/Output from previous day: 07/09 0701 - 07/10 0700 In: 783.9 [P.O.:150; I.V.:583.9; IV Piggyback:50] Out: 960 [Urine:850; Drains:110] Intake/Output this shift: Total I/O In: 416.1 [I.V.:416.1] Out: -   General appearance: No acute distress, cooperative, patient has difficulty hearing. Resp: Regular work of breathing on 2L [place]. Cardio: Warm and well perfused. Incision/Wound: Surgical incision clean, dry. JP drain clear/light yellow fluid.  Lab Results:  Recent Labs    06/11/22 0707 06/12/22 0514  WBC 31.0* 30.3*  HGB 13.4 11.4*  HCT 41.4 35.9*  PLT 228 247   BMET Recent Labs    06/11/22 0707 06/12/22 0514  NA 145 142  K 3.3* 3.0*  CL 107 107  CO2 30 31  GLUCOSE 118* 129*  BUN 51* 40*  CREATININE 1.66* 1.44*  CALCIUM 9.6 9.2   PT/INR No results for input(s): "LABPROT", "INR" in the last 72 hours.  Studies/Results: [place] EKG SITE RITE  Result Date: 06/11/2022 If Site Rite image not attached, placement could not be confirmed due to current cardiac rhythm.  CT ABDOMEN PELVIS WO CONTRAST  Result Date: 06/11/2022 CLINICAL DATA:  EXPLORATORY LAPAROTOMY, omental patchImpression: Worsening leukocytosis. EXAM: CT ABDOMEN AND PELVIS WITHOUT CONTRAST TECHNIQUE: Multidetector CT imaging of the abdomen and pelvis was performed following the standard protocol without IV contrast. RADIATION DOSE REDUCTION: This exam was performed according to the  departmental dose-optimization program which includes automated exposure control, adjustment of the mA and/or kV according to patient size and/or use of iterative reconstruction technique. COMPARISON:  06/03/2022. FINDINGS: Lower chest: Moderate pleural effusions. Dependent lower lobe and right middle lobe opacity, likely atelectasis. Additional ground-glass opacities are noted, most evident in the right middle lobe, suspicious for infection. Effusions are increased on the left and new on the right since the prior CT. Lung base opacities are mostly new. Hepatobiliary: No focal liver abnormality is seen. No gallstones, gallbladder wall thickening, or biliary dilatation. Pancreas: Unremarkable. No pancreatic ductal dilatation or surrounding inflammatory changes. Spleen: Normal in size without focal abnormality. Adrenals/Urinary Tract: No adrenal masses. No renal masses or hydronephrosis. Bladder is unremarkable. Stomach/Bowel: Stomach is mostly decompressed. There is an apparent defect along the anterior aspect of the mid to distal stomach through which contrast and air has collected. The ill-defined collection of contrast and air lies in the mid to upper abdomen near the midline, with an adjacent surgical drain, and deep to the midline laparotomy incision. Poorly defined small bowel loops in the central abdomen. There is no colon or small bowel dilation to suggest obstruction. No colonic wall thickening or convincing inflammation. Vascular/Lymphatic: Aortic atherosclerosis. Prominent lymph nodes noted along the gastrohepatic ligament. There are no enlarged lymph nodes. Reproductive: Status post hysterectomy. No adnexal masses. Other: Small amount of ascites. Generalized increased attenuation of the mesenteric/peritoneal fat consistent with edema. Diffuse subcutaneous soft tissue edema. Musculoskeletal: No acute finding. IMPRESSION: 1. There is no defined fluid collection to indicate an abscess. 2. There is  extraluminal air and contrast  in the anterior central to upper abdomen, that appears to be arising from a defect along the anterior gastric wall. The extraluminal contrast and air lies adjacent to the surgical drain. 3. Small amount of ascites is similar to the previous CT. 4. Mesenteric/peritoneal and diffuse subcutaneous edema. 5. Moderate pleural effusions with associated lung base atelectasis. Additional areas of ground-glass lung opacity are suspicious for multifocal pneumonia. Electronically Signed   By: Lajean Manes M.D.   On: 06/11/2022 12:56    Anti-infectives: Anti-infectives (From admission, onward)    Start     Dose/Rate Route Frequency Ordered Stop   06/11/22 2200  piperacillin-tazobactam (ZOSYN) IVPB 3.375 g        3.375 g 12.5 mL/hr over 240 Minutes Intravenous Every 8 hours 06/11/22 1415     06/11/22 1500  piperacillin-tazobactam (ZOSYN) IVPB 3.375 g        3.375 g 100 mL/hr over 30 Minutes Intravenous  Once 06/11/22 1410 06/12/22 0940   06/07/22 0200  fluconazole (DIFLUCAN) IVPB 200 mg  Status:  Discontinued        200 mg 100 mL/hr over 60 Minutes Intravenous Every 24 hours 06/06/22 0901 06/06/22 1031   06/06/22 1200  micafungin (MYCAMINE) 100 mg in sodium chloride 0.9 % 100 mL IVPB        100 mg 105 mL/hr over 1 Hours Intravenous Every 24 hours 06/06/22 1031     06/05/22 1800  piperacillin-tazobactam (ZOSYN) IVPB 3.375 g        3.375 g 12.5 mL/hr over 240 Minutes Intravenous Every 12 hours 06/05/22 0825 06/09/22 2000   06/04/22 0600  piperacillin-tazobactam (ZOSYN) IVPB 3.375 g  Status:  Discontinued        3.375 g 12.5 mL/hr over 240 Minutes Intravenous Every 8 hours 06/04/22 0219 06/05/22 0825   06/04/22 0315  fluconazole (DIFLUCAN) IVPB 200 mg  Status:  Discontinued        200 mg 100 mL/hr over 60 Minutes Intravenous Every 24 hours 06/04/22 0217 06/04/22 0219   06/04/22 0315  fluconazole (DIFLUCAN) IVPB 200 mg  Status:  Discontinued        200 mg 100 mL/hr over 60  Minutes Intravenous Every 24 hours 06/04/22 0219 06/06/22 0901   06/03/22 2130  ceFEPIme (MAXIPIME) 2 g in sodium chloride 0.9 % 100 mL IVPB        2 g 200 mL/hr over 30 Minutes Intravenous  Once 06/03/22 2127 06/03/22 2209   06/03/22 2130  metroNIDAZOLE (FLAGYL) IVPB 500 mg  Status:  Discontinued        500 mg 100 mL/hr over 60 Minutes Intravenous Every 12 hours 06/03/22 2127 06/04/22 0217       Assessment/Plan: s/p Procedure(s): POD 8; EXPLORATORY LAPAROTOMY with omental patch   Patient is a 76 y.o. F who underwent an EXPLORATORY LAPAROTOMY with omental patch and presented with septic shock.  - PRN for pain - Patient on 2L [place], regular work of breathing - Patient's abdominal CT showed extraluminal air and contrast in the anterior central to upper abdomen and lies adjacent to surgical drain. Plan to hold off on NG tube for now and re-image in 1 week to re-evaluate for free air - Patient has elevated total bilirubin; patient to receive ultrasound to evaluate for acalculous cholecystitis  - Patient received PICC line insertion this morning and will be receiving TPN - CT suspicious for multifocal pneumonia - patient receiving antibiotics. 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

## 2022-06-12 NOTE — Progress Notes (Signed)
Physical Therapy Treatment Patient Details Name: Carolyn Erickson MRN: [id number] DOB: 08/01/1946 Today's Date: 06/12/2022   History of Present Illness Carolyn Erickson is a 76 yo with a history of HTN and depression who comes in with weakness and abdominal pain. She has been having some pain generalized for months being treated for gastritis/ ulcer with PPI by PCP and referred to GI. She has not seen GI. She has a history of diarrhea and constipation. She says over the last few days she was feeling weaker and her pain because worse and more severe acutely. She denies any chest pain or SOB. She has no fever. She has been hypotensive and required fluid bolus and is on levophed at 2 in the ED.    PT Comments    Patient presents seated in chair (assisted by nursing staff) and agreeable for therapy.  Patient demonstrates fair return for completing BLE ROM/strengthening exercises requiring frequent verbal/tactile cueing and active assistance for competing most exercises, slightly increased endurance for taking steps forward/backwards at bedside with Mod assist, but limited mostly due to fatigue and BLE weakness.  Patient tolerated staying up in chair after therapy - nurse aware.  Patient will benefit from continued skilled physical therapy in hospital and recommended venue below to increase strength, balance, endurance for safe ADLs and gait.     Recommendations for follow up therapy are one component of a multi-disciplinary discharge planning process, led by the attending physician.  Recommendations may be updated based on patient status, additional functional criteria and insurance authorization.  Follow Up Recommendations  Skilled nursing-short term rehab (<3 hours/day) Can patient physically be transported by private vehicle: Yes   Assistance Recommended at Discharge Intermittent Supervision/Assistance  Patient can return home with the following A lot of help with walking and/or transfers;Help with  stairs or ramp for entrance;A lot of help with bathing/dressing/bathroom;Assistance with cooking/housework   Equipment Recommendations  None recommended by PT    Recommendations for Other Services       Precautions / Restrictions Precautions Precautions: Fall Restrictions Weight Bearing Restrictions: No     Mobility  Bed Mobility               General bed mobility comments: Patient presents up in chair (assisted by nursing staff)    Transfers Overall transfer level: Needs assistance Equipment used: Rolling walker (2 wheels) Transfers: Sit to/from Stand Sit to Stand: Min assist, Mod assist           General transfer comment: slightly increased strength for completing sit to stands from chair    Ambulation/Gait Ambulation/Gait assistance: Mod assist Gait Distance (Feet): 6 Feet Assistive device: Rolling walker (2 wheels) Gait Pattern/deviations: Decreased step length - right, Decreased step length - left, Decreased stride length Gait velocity: decreased     General Gait Details: limited to a few steps forward/backwards before having to sit due to fatigue and BLE weakness   Stairs             Wheelchair Mobility    Modified Rankin (Stroke Patients Only)       Balance Overall balance assessment: Needs assistance Sitting-balance support: Feet supported, No upper extremity supported Sitting balance-Leahy Scale: Fair Sitting balance - Comments: seated in chair   Standing balance support: Reliant on assistive device for balance, During functional activity, Bilateral upper extremity supported Standing balance-Leahy Scale: Poor Standing balance comment: fair/poor using RW  Cognition Arousal/Alertness: Awake/alert Behavior During Therapy: WFL for tasks assessed/performed Overall Cognitive Status: Within Functional Limits for tasks assessed                                          Exercises  General Exercises - Lower Extremity Long Arc Quad: Seated, AAROM, Strengthening, Both, 10 reps Hip Flexion/Marching: Seated, AAROM, Strengthening, Both, 10 reps Toe Raises: Seated, AROM, AAROM, Strengthening, Both, 10 reps Heel Raises: Seated, AAROM, AROM, Strengthening, Both, 10 reps    General Comments        Pertinent Vitals/Pain Pain Assessment Pain Assessment: Faces Faces Pain Scale: Hurts a little bit Pain Location: abdomen at surgical site Pain Descriptors / Indicators: Sore Pain Intervention(s): Limited activity within patient's tolerance, Monitored during session, Repositioned    Home Living                          Prior Function            PT Goals (current goals can now be found in the care plan section) Acute Rehab PT Goals Patient Stated Goal: return home PT Goal Formulation: With patient Time For Goal Achievement: 06/21/22 Potential to Achieve Goals: Good Progress towards PT goals: Progressing toward goals    Frequency    Min 3X/week      PT Plan Current plan remains appropriate    Co-evaluation              AM-PAC PT "6 Clicks" Mobility   Outcome Measure  Help needed turning from your back to your side while in a flat bed without using bedrails?: A Lot Help needed moving from lying on your back to sitting on the side of a flat bed without using bedrails?: A Lot Help needed moving to and from a bed to a chair (including a wheelchair)?: A Lot Help needed standing up from a chair using your arms (e.g., wheelchair or bedside chair)?: A Lot Help needed to walk in hospital room?: A Lot Help needed climbing 3-5 steps with a railing? : Total 6 Click Score: 11    End of Session Equipment Utilized During Treatment: Oxygen Activity Tolerance: Patient tolerated treatment well;Patient limited by fatigue Patient left: in chair;with call bell/phone within reach;with nursing/sitter in room Nurse Communication: Mobility status PT Visit  Diagnosis: Unsteadiness on feet (R26.81);Other abnormalities of gait and mobility (R26.89);Muscle weakness (generalized) (M62.81)     Time: 1308-6578 PT Time Calculation (min) (ACUTE ONLY): 30 min  Charges:  $Therapeutic Exercise: 8-22 mins $Therapeutic Activity: 8-22 mins                     12:26 PM, 06/12/22 Lonell Grandchild, MPT Physical Therapist with [facility] [phone] office [phone] mobile phone

## 2022-06-12 NOTE — Progress Notes (Addendum)
Nutrition Follow-up  DOCUMENTATION CODES:      INTERVENTION:  TPN per pharmacy  NUTRITION DIAGNOSIS:   Increased nutrient needs related to post-op healing as evidenced by estimated needs. -continues  GOAL:   Patient will meet greater than or equal to 90% of their needs -ongoing  MONITOR:   Labs, I & O's, Skin, Weight trends  REASON FOR ASSESSMENT:   Consult TPN/TNA  ASSESSMENT:  Patient is a 76 yo female with gastric ulcer perforation. Status post exploratory laparotomy and omental patch. J/P drain. Septic shock.  PMH: Neurocognitive disorder, HTN, HLD, HTN and left breast cancer. Stable weight history 49-50 kg x 6 months.   7/6 clear liquids started- requires assistance with feeding at this time 7/8-20%, 25%, 20%- < 400 kcal x 24hr 7/9-50%, 50% 0%- <400 kcal x 24 hr  7/9 CT -gastric leak, worsening leukocytosis. 7/10 PICC placement  Patient to resume TPN @ 35 ml/hrat 1800. Patient expected to require at least 5 d per MD. TOC-investigating possible LTAC transition. Nutrition needs were re-assessed after extubation. Phos and standard MVI with trace elements with TPN.  Patient up in chair and daughter is bedside. Patient is wanting to stand up and daughter it trying to console her and keep her from attempting to get up.   Fungemia addressed. Mild BLE and right hand edema. Patient is 4 kg above admission weight. I/O's reviewed.   Intake/Output Summary (Last 24 hours) at 06/12/2022 1304 Last data filed at 06/12/2022 1002 Gross per 24 hour  Intake 1050 ml  Output 680 ml  Net 370 ml   Since admission-+11.7 liters  Medications reviewed and include: insulin protonix.   Total protein 4.7 (L), total billirubin 4.6 (H), WBC-30.3 (H). Potassium and Mag replaced.    Latest Ref Rng & Units 06/12/2022    5:14 AM 06/11/2022    7:07 AM 06/10/2022    5:49 AM  BMP  Glucose 70 - 99 mg/dL 129  118  112   BUN 8 - 23 mg/dL 40  51  54   Creatinine 0.44 - 1.00 mg/dL 1.44  1.66  1.97    Sodium 135 - 145 mmol/L 142  145  146   Potassium 3.5 - 5.1 mmol/L 3.0  3.3  3.9   Chloride 98 - 111 mmol/L 107  107  109   CO2 22 - 32 mmol/L '31  30  29   '$ Calcium 8.9 - 10.3 mg/dL 9.2  9.6  9.2      Diet Order:   Diet Order             Diet NPO time specified Except for: Sips with Meds  Diet effective now                   EDUCATION NEEDS:   Not appropriate for education at this time  Skin:  Skin Assessment: Skin Integrity Issues: Skin Integrity Issues:: Incisions Incisions: abdomen with honeycomb dressing-healing well per MD.  Last BM:  7/7  Height:   Ht Readings from Last 1 Encounters:  06/04/22 '5\' 1"'$  (1.549 m)    Weight:   Wt Readings from Last 1 Encounters:  06/11/22 54.9 kg  Admit weight- 50.9 kg  Ideal Body Weight:   48 kg  BMI:  Body mass index is 22.87 kg/m.  Estimated Nutritional Needs:   Kcal:  1400-1600  Protein:  75-80  Fluid:  1600 ml daily  Colman Cater MS,RD,CSG,LDN Contact: Shea [place]

## 2022-06-12 NOTE — Progress Notes (Signed)
ID Brief Note   Remains afebrile WBC went up to 30.3, Zosyn added back 7/9 Labs remarkable for potassium 3, creatinine 1.44, magnesium 1.6, albumin less than 1.5, T. bili 4.6 JP drain with clear yellow fluid   Repeat blood culture 7/4 no growth in 5 days.  Candida glabrata sensitivity reviewed (fluconazole MIC 16)  CT abdomen pelvis 7/10  No defined fluid collection to indicate an abscess.  Extraluminal air and contrast in the anterior central to upper abdomen that appears to be arising from a defect along the anterior gastric wall.  Small amount of ascites.  Moderate pleural effusion with associated lung base atelectasis.  Additional areas of groundglass lung opacity are suspicious for multifocal pneumonia.   Started on TPN 7/10  Recommendations  Okay to place PICC as repeat blood cultures are negative ( finalized) Plan for micafungin for 2 weeks from date of negative blood cx 7/4 given Fluconazole MIC 16 and difficulty with dosing esp with high dosing of Fluconazole for SDD in the setting of AKI. D/w ID pharmacist Fu surgery recommendations - plan for repeat CT in a week , [place] RUQ given LFT abnormality Continue Zosyn Monitor CBC, CMP D/w Primary   Rosiland Oz, MD Infectious Disease Physician [facility] [address] [place], [place] [address] Phone: [phone]  Fax: [phone]

## 2022-06-13 DIAGNOSIS — N179 Acute kidney failure, unspecified: Secondary | ICD-10-CM | POA: Diagnosis not present

## 2022-06-13 DIAGNOSIS — K255 Chronic or unspecified gastric ulcer with perforation: Secondary | ICD-10-CM | POA: Diagnosis not present

## 2022-06-13 DIAGNOSIS — A419 Sepsis, unspecified organism: Secondary | ICD-10-CM | POA: Diagnosis not present

## 2022-06-13 DIAGNOSIS — B49 Unspecified mycosis: Secondary | ICD-10-CM | POA: Diagnosis not present

## 2022-06-13 LAB — BASIC METABOLIC PANEL
Anion gap: 5 (ref 5–15)
BUN: 37 mg/dL — ABNORMAL HIGH (ref 8–23)
CO2: 29 mmol/L (ref 22–32)
Calcium: 9.2 mg/dL (ref 8.9–10.3)
Chloride: 107 mmol/L (ref 98–111)
Creatinine, Ser: 1.37 mg/dL — ABNORMAL HIGH (ref 0.44–1.00)
GFR, Estimated: 40 mL/min — ABNORMAL LOW (ref >60)
Glucose, Bld: 174 mg/dL — ABNORMAL HIGH (ref 70–99)
Potassium: 3.5 mmol/L (ref 3.5–5.1)
Sodium: 141 mmol/L (ref 135–145)

## 2022-06-13 LAB — HEPATIC FUNCTION PANEL
ALT: 18 U/L (ref 0–44)
AST: 27 U/L (ref 15–41)
Albumin: <1.5 g/dL — ABNORMAL LOW (ref 3.5–5.0)
Alkaline Phosphatase: 92 U/L (ref 38–126)
Bilirubin, Direct: 3.1 mg/dL — ABNORMAL HIGH (ref 0.0–0.2)
Indirect Bilirubin: 1.5 mg/dL — ABNORMAL HIGH (ref 0.3–0.9)
Total Bilirubin: 4.6 mg/dL — ABNORMAL HIGH (ref 0.3–1.2)
Total Protein: 4.7 g/dL — ABNORMAL LOW (ref 6.5–8.1)

## 2022-06-13 LAB — CBC WITH DIFFERENTIAL/PLATELET
Abs Immature Granulocytes: 0.4 10*3/uL — ABNORMAL HIGH (ref 0.00–0.07)
Basophils Absolute: 0.1 10*3/uL (ref 0.0–0.1)
Basophils Relative: 0 %
Eosinophils Absolute: 0.5 10*3/uL (ref 0.0–0.5)
Eosinophils Relative: 2 %
HCT: 35.4 % — ABNORMAL LOW (ref 36.0–46.0)
Hemoglobin: 11.4 g/dL — ABNORMAL LOW (ref 12.0–15.0)
Immature Granulocytes: 1 %
Lymphocytes Relative: 5 %
Lymphs Abs: 1.5 10*3/uL (ref 0.7–4.0)
MCH: 29.8 pg (ref 26.0–34.0)
MCHC: 32.2 g/dL (ref 30.0–36.0)
MCV: 92.4 fL (ref 80.0–100.0)
Monocytes Absolute: 1 10*3/uL (ref 0.1–1.0)
Monocytes Relative: 4 %
Neutro Abs: 24.7 10*3/uL — ABNORMAL HIGH (ref 1.7–7.7)
Neutrophils Relative %: 88 %
Platelets: 304 10*3/uL (ref 150–400)
RBC: 3.83 MIL/uL — ABNORMAL LOW (ref 3.87–5.11)
RDW: 14.2 % (ref 11.5–15.5)
WBC: 28.3 10*3/uL — ABNORMAL HIGH (ref 4.0–10.5)
nRBC: 0 % (ref 0.0–0.2)

## 2022-06-13 LAB — GLUCOSE, CAPILLARY
Glucose-Capillary: 143 mg/dL — ABNORMAL HIGH (ref 70–99)
Glucose-Capillary: 148 mg/dL — ABNORMAL HIGH (ref 70–99)
Glucose-Capillary: 156 mg/dL — ABNORMAL HIGH (ref 70–99)
Glucose-Capillary: 174 mg/dL — ABNORMAL HIGH (ref 70–99)
Glucose-Capillary: 177 mg/dL — ABNORMAL HIGH (ref 70–99)

## 2022-06-13 LAB — PHOSPHORUS: Phosphorus: 3.5 mg/dL (ref 2.5–4.6)

## 2022-06-13 LAB — MAGNESIUM: Magnesium: 2.3 mg/dL (ref 1.7–2.4)

## 2022-06-13 MED ORDER — KCL IN DEXTROSE-NACL 10-5-0.45 MEQ/L-%-% IV SOLN
INTRAVENOUS | Status: DC
  Filled 2022-06-13: qty 1000

## 2022-06-13 MED ORDER — SODIUM CHLORIDE 0.9 % IV SOLN: 3.0000 g | Freq: Three times a day (TID) | INTRAVENOUS | Status: DC

## 2022-06-13 MED ORDER — TRAVASOL 10 % IV SOLN
INTRAVENOUS | Status: AC
  Filled 2022-06-13: qty 705.6

## 2022-06-13 MED ORDER — POTASSIUM CHLORIDE 10 MEQ/100ML IV SOLN
10.0000 meq | INTRAVENOUS | Status: AC
  Administered 2022-06-13 (×2): 10 meq via INTRAVENOUS
  Filled 2022-06-13: qty 100

## 2022-06-13 MED ORDER — SODIUM CHLORIDE 0.9 % IV SOLN
3.0000 g | Freq: Two times a day (BID) | INTRAVENOUS | Status: DC
  Administered 2022-06-13 – 2022-06-15 (×6): 3 g via INTRAVENOUS
  Filled 2022-06-13 (×8): qty 8

## 2022-06-13 NOTE — Progress Notes (Incomplete)
PHARMACY CONSULT NOTE FOR:  OUTPATIENT  PARENTERAL ANTIBIOTIC THERAPY (OPAT)  Indication:  Regimen:  End date:   IV antibiotic discharge orders are pended. To discharging provider:  please sign these orders via discharge navigator,  Select New Orders & click on the button choice - Manage This Unsigned Work.     Thank you for allowing pharmacy to be a part of this patient's care.  Carolyn Erickson 06/13/2022, 8:36 AM

## 2022-06-13 NOTE — Progress Notes (Signed)
ID brief note  Chart reviewed, patient not seen in person  Remains afebrile, WBC down from 30>28 Cr down trending to 1.37 T bili at 4.6, D bili 3.1, I bili 1.5  [place] RUQ 7/10 no acute abnormality   Recommendations  Switch zosyn to unasyn, low concerns for PsA Continue Micafungin as previously stated  Ophthalmology exam can be done on non urgent basis Monitor CBC for leukocytosis and CMP  Fu surgical plans, plan for repeat CT ? 7/17 D/w primary   Rosiland Oz, MD Infectious Disease Physician [facility] [address] [place], [place] [address] Phone: [phone]  Fax: [phone]

## 2022-06-13 NOTE — Progress Notes (Addendum)
Patient took medications with not issue. Patient watched TV and slept the rest of shift after 0100 only waking up with CBG checks and this morning to change dressing and  drain JP. 38m output JP Drain.

## 2022-06-13 NOTE — Progress Notes (Signed)
Physical Therapy Treatment Patient Details Name: Carolyn Erickson MRN: [id number] DOB: 1946/07/12 Today's Date: 06/13/2022   History of Present Illness Ms. Burkhammer is a 76 yo with a history of HTN and depression who comes in with weakness and abdominal pain. She has been having some pain generalized for months being treated for gastritis/ ulcer with PPI by PCP and referred to GI. She has not seen GI. She has a history of diarrhea and constipation. She says over the last few days she was feeling weaker and her pain because worse and more severe acutely. She denies any chest pain or SOB. She has no fever. She has been hypotensive and required fluid bolus and is on levophed at 2 in the ED.    PT Comments    Patient supine upon therapist arrival and agreeable to participating in therapy session today. Patient reported she was feeling as if she was going to get sick and bag was given to patient as therapist arranged room for transfer out of bed to chair. Patient was assisted supine to sit with min assist and HOB elevated. After sitting for 2-3 minutes, patient reported she was experiencing blurry vision and patient proceeded to slump forward.  Patient secured safely before a fall to floor. Patient returned sitting to supine in bed with moderate to maximal assistance, mod assist to boost up in bed and positioned comfortably. Nursing notified. Patient would continue to benefit from skilled physical therapy in current environment and next venue to continue return to prior function and increase strength, endurance, balance, coordination, and functional mobility and gait skills.    Recommendations for follow up therapy are one component of a multi-disciplinary discharge planning process, led by the attending physician.  Recommendations may be updated based on patient status, additional functional criteria and insurance authorization.  Follow Up Recommendations  Skilled nursing-short term rehab (<3  hours/day) Can patient physically be transported by private vehicle: Yes   Assistance Recommended at Discharge Intermittent Supervision/Assistance  Patient can return home with the following A lot of help with walking and/or transfers;Help with stairs or ramp for entrance;A lot of help with bathing/dressing/bathroom;Assistance with cooking/housework   Equipment Recommendations  None recommended by PT    Recommendations for Other Services       Precautions / Restrictions Precautions Precautions: Fall Restrictions Weight Bearing Restrictions: No     Mobility  Bed Mobility   Bed Mobility: Supine to Sit, Sit to Supine     Supine to sit: Min assist, HOB elevated Sit to supine: Max assist, Mod assist   General bed mobility comments: attempted to get patient out of bed. Patient c/o blurry vision when sitting at edge of bed and then crumpled forward. Patient secured safely before a fall to floor. Patient returned to supine in bed and positioned comfortably. Nursing notified.    Transfers     General transfer comment: not attempted    Ambulation/Gait     General Gait Details: not attempted   Stairs       Wheelchair Mobility    Modified Rankin (Stroke Patients Only)       Balance Overall balance assessment: Needs assistance Sitting-balance support: Feet supported, Bilateral upper extremity supported Sitting balance-Leahy Scale: Poor Sitting balance - Comments: seated at edge of bed; complain of blurry vision and crumpled forward; patient secured safely before a fall to the floor       Standing balance comment: not attempted        Cognition Arousal/Alertness: Awake/alert Behavior During  Therapy: WFL for tasks assessed/performed Overall Cognitive Status: Within Functional Limits for tasks assessed        Exercises      General Comments        Pertinent Vitals/Pain Pain Assessment Pain Assessment: Faces Faces Pain Scale: Hurts little more Pain  Location: abdominal surgical site Pain Intervention(s): Limited activity within patient's tolerance, Monitored during session, Repositioned    Home Living         Prior Function            PT Goals (current goals can now be found in the care plan section) Acute Rehab PT Goals Patient Stated Goal: return home PT Goal Formulation: With patient Time For Goal Achievement: 06/21/22 Potential to Achieve Goals: Good Progress towards PT goals: Progressing toward goals    Frequency    Min 3X/week      PT Plan Current plan remains appropriate       AM-PAC PT "6 Clicks" Mobility   Outcome Measure  Help needed turning from your back to your side while in a flat bed without using bedrails?: A Lot Help needed moving from lying on your back to sitting on the side of a flat bed without using bedrails?: A Lot Help needed moving to and from a bed to a chair (including a wheelchair)?: A Lot Help needed standing up from a chair using your arms (e.g., wheelchair or bedside chair)?: A Lot Help needed to walk in hospital room?: A Lot Help needed climbing 3-5 steps with a railing? : Total 6 Click Score: 11    End of Session   Activity Tolerance: Patient tolerated treatment well;Patient limited by fatigue Patient left: with call bell/phone within reach;in bed;with bed alarm set Nurse Communication: Mobility status PT Visit Diagnosis: Unsteadiness on feet (R26.81);Other abnormalities of gait and mobility (R26.89);Muscle weakness (generalized) (M62.81)     Time: 1340-1400 PT Time Calculation (min) (ACUTE ONLY): 20 min  Charges:  $Therapeutic Activity: 8-22 mins                     Floria Raveling. Hartnett-Rands, MS, PT Per [place] [phone]  Pamala Hurry  Hartnett-Rands 06/13/2022, 2:09 PM

## 2022-06-13 NOTE — TOC Progression Note (Signed)
Transition of Care [facility]) - Progression Note    Patient Details  Name: Carolyn Erickson MRN: [id number] Date of Birth: 25-Apr-1946  [facility] CM/SW Contact  Shade Flood, LCSW Phone Number: 06/13/2022, 10:50 AM  Clinical Narrative:     TOC following. Dr. Wynetta Emery states that pt showing improvement today. TPN will continue until at least Monday of next week. If pt's CT on Monday shows resolution of the leak, then diet will be restarted. Anticipating pt will remain at [facility] rather than transfer to [facility] per Dr. Wynetta Emery. If LTAC pursuit is desired at any point, TOC will assist.  Expected Discharge Plan: [place] Barriers to Discharge: Continued Medical Work up  Expected Discharge Plan and Services Expected Discharge Plan: [place] In-house Referral: Clinical Social Work   Post Acute Care Choice: [place] Living arrangements for the past 2 months: Single Family Home                                       Social Determinants of Health (SDOH) Interventions    Readmission Risk Interventions     No data to display

## 2022-06-13 NOTE — Progress Notes (Signed)
PHARMACY - TOTAL PARENTERAL NUTRITION CONSULT NOTE   Indication: gastric perforation  Patient Measurements: Height: '5\' 1"'$  (154.9 cm) Weight: 54.9 kg (121 lb 0.5 oz) (removed pillows except one/scd machine) IBW/kg (Calculated) : 47.8 TPN AdjBW (KG): 50.9 Body mass index is 22.87 kg/m.  Assessment:  Pharmacy consulted to dose TPN in patient with gastric perforation.  Glucose / Insulin:    114-177- 4 units Electrolytes: K 3.0> 3.5 , mag 1.6> 2.3 Renal: Scr 1.44> 1.37  Hepatic: albumin <1.5 Tbili 4.6 TG 230 Intake / Output; MIVF: D51/2NS w/ 10 mEq/L KCl @ 65 mL/hr Inaccurate I/O GI Surgeries / Procedures: Exploratory laparotomy, gastrorrhaphy  Central access: PICC double lumen 7/10 TPN start date: 7/10  Nutritional Goals: Goal TPN rate is 70 mL/hr (provides 70 g of protein and 1300 kcals per day)  RD Assessment: Kcal:  1100-1300\  Protein:  75-78 gr  Fluid:  >/= 1600 ml daily  Current Nutrition:  NPO  Plan:  Potassium 10 mEq IV x 2 doses  Increase TPN to 70 mL/hr at 1800 Electrolytes in TPN: Na 33mq/L, K 5212m/L, Ca 12m312mL, Mg 12mE80m, and Phos 12mmo77m. Cl:Ac 1:1 Add standard MVI and trace elements to TPN Continue Sensitive q6h SSI and adjust as needed  Decrease IV fluids to 10 mL/hr @ 1800 KVO Monitor TPN labs on Mon/Thurs,   LorieIsac SarnaPharm D, BCPS Clinical Pharmacist 06/13/2022 8:20 AM

## 2022-06-13 NOTE — Progress Notes (Addendum)
I was present with the medical student for this service. I personally verified the history of present illness, performed the physical exam, and made the plan for this encounter. I have verified the medical student's documentation and made modifications where appropriately. I have personally documented in my own words a brief history, physical, and plan below.     Seen this AM. Husband at bedside. Up and glasses on and hearing aid. Looks better.   Explained plan again to husband. Leukocytosis coming down ID adjusting antibiotics.  TPN NPO except for vital meds, Aricept, etc   Curlene Labrum, MD [facility] [place], [place] [address] [phone] (office)    [facility] Progress Note  9 Days Post-Op  Subjective: Patient is alert and cooperative, mental status seems to be improving. Patient remains afebrile, no acute concerns overnight, regular work of breathing on [place].  Objective: Vital signs in last 24 hours: Temp:  [97.8 F (36.6 C)-98.2 F (36.8 C)] 97.8 F (36.6 C) (07/11 0606) Pulse Rate:  [77-84] 77 (07/11 0606) Resp:  [17-20] 20 (07/11 0606) BP: (130-142)/(68-76) 130/76 (07/11 0606) SpO2:  [90 %-98 %] 95 % (07/11 0606) Last BM Date : 06/09/22  Intake/Output from previous day: 07/10 0701 - 07/11 0700 In: 1780.6 [P.O.:120; I.V.:835.3; IV Piggyback:825.3] Out: 630 [Urine:600; Drains:30] Intake/Output this shift: No intake/output data recorded.  General appearance: No acute distress, alert, cooperative. Resp: Regular work of breathing on 2L [place]. CTAB. Cardio: Warm and well perfused Skin: Warm, dry Incision/Wound: Surgical incision clean, dry.  Lab Results:  Recent Labs    06/12/22 0514 06/13/22 0531  WBC 30.3* 28.3*  HGB 11.4* 11.4*  HCT 35.9* 35.4*  PLT 247 304   BMET Recent Labs    06/12/22 0514 06/13/22 0531  NA 142 141  K 3.0* 3.5  CL 107 107  CO2 31 29  GLUCOSE 129* 174*  BUN 40*  37*  CREATININE 1.44* 1.37*  CALCIUM 9.2 9.2   PT/INR No results for input(s): "LABPROT", "INR" in the last 72 hours.  Studies/Results: US Abdomen Limited RUQ (LIVER/GB)  Result Date: 06/12/2022 CLINICAL DATA:  Hyperbilirubinemia EXAM: ULTRASOUND ABDOMEN LIMITED RIGHT UPPER QUADRANT COMPARISON:  CT abdomen and pelvis 06/11/2022 FINDINGS: Gallbladder: Sludge within gallbladder. No definite shadowing calculi. Mild gallbladder wall thickening. No sonographic Murphy sign. Common bile duct: Diameter: 3 mm, normal Liver: Normal echogenicity without mass or nodularity. No intrahepatic biliary dilatation. Portal vein is patent on color Doppler imaging with normal direction of blood flow towards the liver. Other: Perihepatic ascites identified, containing septations/loculations. IMPRESSION: Sludge within gallbladder with mild gallbladder wall thickening, a nonspecific finding in the setting of ascites. No biliary dilatation or focal hepatic abnormality. Electronically Signed   By: Lavonia Dana M.D.   On: 06/12/2022 14:40   [place] EKG SITE RITE  Result Date: 06/11/2022 If Site Rite image not attached, placement could not be confirmed due to current cardiac rhythm.  CT ABDOMEN PELVIS WO CONTRAST  Result Date: 06/11/2022 CLINICAL DATA:  EXPLORATORY LAPAROTOMY, omental patchImpression: Worsening leukocytosis. EXAM: CT ABDOMEN AND PELVIS WITHOUT CONTRAST TECHNIQUE: Multidetector CT imaging of the abdomen and pelvis was performed following the standard protocol without IV contrast. RADIATION DOSE REDUCTION: This exam was performed according to the departmental dose-optimization program which includes automated exposure control, adjustment of the mA and/or kV according to patient size and/or use of iterative reconstruction technique. COMPARISON:  06/03/2022. FINDINGS: Lower chest: Moderate pleural effusions. Dependent lower lobe and right middle lobe opacity, likely  atelectasis. Additional ground-glass opacities are  noted, most evident in the right middle lobe, suspicious for infection. Effusions are increased on the left and new on the right since the prior CT. Lung base opacities are mostly new. Hepatobiliary: No focal liver abnormality is seen. No gallstones, gallbladder wall thickening, or biliary dilatation. Pancreas: Unremarkable. No pancreatic ductal dilatation or surrounding inflammatory changes. Spleen: Normal in size without focal abnormality. Adrenals/Urinary Tract: No adrenal masses. No renal masses or hydronephrosis. Bladder is unremarkable. Stomach/Bowel: Stomach is mostly decompressed. There is an apparent defect along the anterior aspect of the mid to distal stomach through which contrast and air has collected. The ill-defined collection of contrast and air lies in the mid to upper abdomen near the midline, with an adjacent surgical drain, and deep to the midline laparotomy incision. Poorly defined small bowel loops in the central abdomen. There is no colon or small bowel dilation to suggest obstruction. No colonic wall thickening or convincing inflammation. Vascular/Lymphatic: Aortic atherosclerosis. Prominent lymph nodes noted along the gastrohepatic ligament. There are no enlarged lymph nodes. Reproductive: Status post hysterectomy. No adnexal masses. Other: Small amount of ascites. Generalized increased attenuation of the mesenteric/peritoneal fat consistent with edema. Diffuse subcutaneous soft tissue edema. Musculoskeletal: No acute finding. IMPRESSION: 1. There is no defined fluid collection to indicate an abscess. 2. There is extraluminal air and contrast in the anterior central to upper abdomen, that appears to be arising from a defect along the anterior gastric wall. The extraluminal contrast and air lies adjacent to the surgical drain. 3. Small amount of ascites is similar to the previous CT. 4. Mesenteric/peritoneal and diffuse subcutaneous edema. 5. Moderate pleural effusions with associated lung  base atelectasis. Additional areas of ground-glass lung opacity are suspicious for multifocal pneumonia. Electronically Signed   By: Lajean Manes M.D.   On: 06/11/2022 12:56    Anti-infectives: Anti-infectives (From admission, onward)    Start     Dose/Rate Route Frequency Ordered Stop   06/11/22 2200  piperacillin-tazobactam (ZOSYN) IVPB 3.375 g        3.375 g 12.5 mL/hr over 240 Minutes Intravenous Every 8 hours 06/11/22 1415     06/11/22 1500  piperacillin-tazobactam (ZOSYN) IVPB 3.375 g        3.375 g 100 mL/hr over 30 Minutes Intravenous  Once 06/11/22 1410 06/12/22 0940   06/07/22 0200  fluconazole (DIFLUCAN) IVPB 200 mg  Status:  Discontinued        200 mg 100 mL/hr over 60 Minutes Intravenous Every 24 hours 06/06/22 0901 06/06/22 1031   06/06/22 1200  micafungin (MYCAMINE) 100 mg in sodium chloride 0.9 % 100 mL IVPB        100 mg 105 mL/hr over 1 Hours Intravenous Every 24 hours 06/06/22 1031     06/05/22 1800  piperacillin-tazobactam (ZOSYN) IVPB 3.375 g        3.375 g 12.5 mL/hr over 240 Minutes Intravenous Every 12 hours 06/05/22 0825 06/09/22 2000   06/04/22 0600  piperacillin-tazobactam (ZOSYN) IVPB 3.375 g  Status:  Discontinued        3.375 g 12.5 mL/hr over 240 Minutes Intravenous Every 8 hours 06/04/22 0219 06/05/22 0825   06/04/22 0315  fluconazole (DIFLUCAN) IVPB 200 mg  Status:  Discontinued        200 mg 100 mL/hr over 60 Minutes Intravenous Every 24 hours 06/04/22 0217 06/04/22 0219   06/04/22 0315  fluconazole (DIFLUCAN) IVPB 200 mg  Status:  Discontinued  200 mg 100 mL/hr over 60 Minutes Intravenous Every 24 hours 06/04/22 0219 06/06/22 0901   06/03/22 2130  ceFEPIme (MAXIPIME) 2 g in sodium chloride 0.9 % 100 mL IVPB        2 g 200 mL/hr over 30 Minutes Intravenous  Once 06/03/22 2127 06/03/22 2209   06/03/22 2130  metroNIDAZOLE (FLAGYL) IVPB 500 mg  Status:  Discontinued        500 mg 100 mL/hr over 60 Minutes Intravenous Every 12 hours 06/03/22  2127 06/04/22 0217       Assessment/Plan: s/p Procedure(s): POD 9; EXPLORATORY LAPAROTOMY with omental patch   Patient is a 76 y.o. F who underwent an EXPLORATORY LAPAROTOMY with omental patch and presented with septic shock.  - PRN for pain - Patient on [place], regular work of breathing - Patient's abdominal CT showed extraluminal air and contrast in the anterior central to upper abdomen and lies adjacent to surgical drain. Reimage Monday 7/17. - Patient has elevated total bilirubin; US abdomen showed no biliary dilation with sludge within gallbladder and mild wall thickening in the setting of ascites. Continue to monitor for cholecystic symptoms - Patient received PICC line insertion yesterday and TPN re-started (7/10) - Patient continuing to receive antibiotics for contained leak.  Patient continuing to receive micafungin for previous fungal infection (repeat blood culture on 7/4 - no growth after 5 days)   LOS: 9 days    Pirapat Rerk[place], Medical Student 06/13/2022

## 2022-06-13 NOTE — Progress Notes (Signed)
Pharmacy Antibiotic Note  Carolyn Erickson is a 76 y.o. female admitted on 06/03/2022 and found to have gastric perforation with peritonitis. Pharmacy has been consulted to adjust Zosyn to Unasyn.  SCr 1.37, CrCl<30 ml/min - will renally reduce Unasyn dosing. Micafungin continuing for Candida glabrata fungemia.  Plan: - D/c Zosyn - Start Unasyn 3g IV every 12 hours - Continue Micafungin 100 mg IV every 24 hours - Will continue to follow renal function, culture results, LOT, and antibiotic de-escalation plans   Height: '5\' 1"'$  (154.9 cm) Weight: 54.9 kg (121 lb 0.5 oz) (removed pillows except one/scd machine) IBW/kg (Calculated) : 47.8  Temp (24hrs), Avg:98.1 F (36.7 C), Min:97.8 F (36.6 C), Max:98.2 F (36.8 C)  Recent Labs  Lab 06/09/22 0503 06/10/22 0549 06/11/22 0707 06/12/22 0514 06/13/22 0531  WBC 23.5* 25.6* 31.0* 30.3* 28.3*  CREATININE 1.88* 1.97* 1.66* 1.44* 1.37*    Estimated Creatinine Clearance: 25.5 mL/min (A) (by C-G formula based on SCr of 1.37 mg/dL (H)).    No Known Allergies  Thank you for allowing pharmacy to be a part of this patient's care.  Alycia Rossetti, PharmD, BCPS Infectious Diseases Clinical Pharmacist 06/13/2022 8:24 AM   **Pharmacist phone directory can now be found on [email or web address] ([email or web address]).  Listed under [place].

## 2022-06-13 NOTE — Progress Notes (Addendum)
PROGRESS NOTE  Carolyn Erickson SLH:[id number] DOB: 11/08/1946 DOA: 06/03/2022 PCP: Dettinger, Fransisca Kaufmann, MD  Brief History:  76 year old female with history of major neurocognitive disorder, hypertension, hyperlipidemia, depression, left breast cancer presenting with generalized weakness and abdominal pain.  Apparently, the patient has been having abdominal pain since April 2023.  The patient had been started on pantoprazole at that time.  There was no improvement.  She followed up with her PCP on 06/02/2022.  Referral was given to see gastroenterology.  Abdominal pain had worsened over the past 2 days prior to admission with worsening generalized weakness.  There is no fevers, chills, chest pain, shortness breath, vomiting, diarrhea.  There is no hematochezia or melena noted.  In the emergency department, the patient was hypotensive with a blood pressure of 71/54.  She was somnolent.  CT of the abdomen and pelvis was performed and showed changes consistent with a perforated gastric ulcer.  Dr. Constance Haw was consulted, and the patient was taken to the OR for exploratory laparotomy, gastrorrhaphy, omental patch.  A left IJ central line was also placed.  TRH is consulted for medical management  In the ED, the patient had low-grade fever 99.5 F.  She was tachycardic and hypotensive with blood pressure 68/53.  WBC 4.1, hemoglobin 14.6, platelets 308,000.  BMP showed sodium 137, potassium 4.1, bicarbonate 22, serum creatinine 2.14.  Postoperatively, the patient was started on Zosyn and fluconazole.  She remained on the ventilator.  Preoperative chest x-ray showed bibasilar atelectasis.  There was free air under the diaphragm.   Assessment and Plan: * Septic shock - RESOLVED Presented with hypotension, tachycardia, and lactic acid peaked at 6.8 Pt successfully weaned off norepinephrine infusion Secondary to gastric perforation with peritonitis 06/03/22 blood cultures--C. glabrata UA 11-20 WBC>>urine  culture unrevealing Continue IV antibiotics per ID  discontinued fluconazole per ID, continue echinocandin (micafungin)  Leukocytosis -- WBC trending down from 30 -- appreciate ID and surgery recommendations, continue antifungal and antibiotics  Hyperbilirubinemia -- secondary to persistent leak.  Continuing to monitor.   Volume overload -- treated with IV lasix 40 mg x 2 on 7/7 and resolved  Fungemia 06/03/22 blood cultures--Candida glabrata -d/c fluconazole per ID consult -continue echinocandin per ID  -discussed with general surgery>>removed central line 7/4 for line holiday -repeat blood culture on 7/4 with no growth to date   Hypomagnesemia IV replacement ordered and repleted, continue monitoring   Acute respiratory failure with hypoxia (HCC) Currently PRVC, RR 20, Vt 380, FiO2 0.4 -7/3 personally reviewed CXR--increased interstitial marking -7/3 ABG--7.35/36/107/20 (0.4) -7/3--extubated -7/4-5--stable on 4L [place] -7/6 - down to 2L/min [place]  Major neurocognitive disorder [facility]) Patient had MoCa 17/30 on 03/14/22 -resumed donezepil, venlafaxine, bupropion 06/12/22  Acute renal failure superimposed on stage 3a chronic kidney disease (HCC) -Serum creatinine peaked at 2.28 but trending down  -Secondary to hemodynamic changes/sepsis and hypovolemia -Monitor BMPs  GERD (gastroesophageal reflux disease) Continue pantoprazole IV twice daily  Gastric perforation ([place]) Suspect this may be due to chronic NSAID use -Continue PPI twice daily -06/04/2022--ex laparotomy with omental patch -Postoperative care per Dr. Constance Haw -TNA therapy on hold temporarily due to need for CVL holiday -UGI 7/6 - no leak seen.  7/9 - CT c/w leak present - PICC line placed 7/10 and TPN started 7/10 - surgery planning to repeat CT on Monday 7/17  Essential hypertension Resumed amlodipine 2.5 mg daily  Mixed hyperlipidemia -- resumed home atorvastatin   Family Communication:  Family at bedside 7/4,  7/5, 7/6, 7/7, 7/8, 7/9, 7/10, 7/11  Consultants:  general surgery  Code Status:  FULL   DVT Prophylaxis:  SCDs  Procedures: As Listed in Progress Note Above  Antibiotics: Fluconazole 7/2>>7/4 Micafungin 7/4>> Zosyn 7/2>>7/11 Unasyn 7/11>>  Subjective: Pt much more alert and interactive today.     Objective: Vitals:   06/12/22 0607 06/12/22 1415 06/12/22 2200 06/13/22 0606  BP: 119/73 (!) 142/71 134/68 130/76  Pulse: 87 80 84 77  Resp: '16 17 20 20  '$ Temp: 98 F (36.7 C) 98.2 F (36.8 C) 98.2 F (36.8 C) 97.8 F (36.6 C)  TempSrc: Oral Oral Oral Oral  SpO2: 98% 98% 90% 95%  Weight:      Height:        Intake/Output Summary (Last 24 hours) at 06/13/2022 1322 Last data filed at 06/13/2022 0800 Gross per 24 hour  Intake 1364.49 ml  Output 230 ml  Net 1134.49 ml   Weight change:  Exam:  General:  awake, alert, hard of hearing, NAD, cooperative. moist mucus membranes.  HEENT: No icterus, No thrush, No neck mass, [place]/AT Cardiovascular: normal s1,s2 sounds, no m/r/g heard. Respiratory: bilateral BS shallow but clear, no rales, rare exp wheezing heard. Abdomen: soft, nondistended, no masses palpated. Normal BS Extremities: no clubbing or cyanosis, trace pretibial edema.  Data Reviewed: I have personally reviewed following labs and imaging studies Basic Metabolic Panel: Recent Labs  Lab 06/08/22 0344 06/09/22 0503 06/10/22 0549 06/11/22 0707 06/12/22 0514 06/13/22 0531  NA 140 144 146* 145 142 141  K 4.7 4.1 3.9 3.3* 3.0* 3.5  CL 111 112* 109 107 107 107  CO2 '23 26 29 30 31 29  '$ GLUCOSE 72 140* 112* 118* 129* 174*  BUN 47* 54* 54* 51* 40* 37*  CREATININE 1.82* 1.88* 1.97* 1.66* 1.44* 1.37*  CALCIUM 8.4* 8.9 9.2 9.6 9.2 9.2  MG 2.3 2.3 2.0  --  1.6* 2.3  PHOS 4.8*  --   --   --  3.5 3.5   Liver Function Tests: Recent Labs  Lab 06/09/22 0905 06/10/22 0549 06/11/22 0707 06/12/22 0514 06/13/22 0531  AST '21 22 30 27 27  '$ ALT '14 14 18 18 18  '$ ALKPHOS 89  91 122 99 92  BILITOT 3.2* 3.8* 4.8* 4.6* 4.6*  PROT 4.7* 4.7* 5.6* 4.7* 4.7*  ALBUMIN <1.5* <1.5* 1.8* <1.5* <1.5*   No results for input(s): "LIPASE", "AMYLASE" in the last 168 hours.  No results for input(s): "AMMONIA" in the last 168 hours. Coagulation Profile: No results for input(s): "INR", "PROTIME" in the last 168 hours.  CBC: Recent Labs  Lab 06/09/22 0503 06/10/22 0549 06/11/22 0707 06/12/22 0514 06/13/22 0531  WBC 23.5* 25.6* 31.0* 30.3* 28.3*  NEUTROABS 18.6* 20.8* 25.8* 26.2* 24.7*  HGB 12.0 12.4 13.4 11.4* 11.4*  HCT 38.3 38.9 41.4 35.9* 35.4*  MCV 93.9 93.5 91.8 92.3 92.4  PLT 122* 156 228 247 304   Cardiac Enzymes: No results for input(s): "CKTOTAL", "CKMB", "CKMBINDEX", "TROPONINI" in the last 168 hours. BNP: Invalid input(s): "POCBNP" CBG: Recent Labs  Lab 06/12/22 0609 06/13/22 0009 06/13/22 0605 06/13/22 0729 06/13/22 1119  GLUCAP 114* 177* 174* 148* 156*   HbA1C: No results for input(s): "HGBA1C" in the last 72 hours. Urine analysis:    Component Value Date/Time   COLORURINE AMBER (A) 06/04/2022 0520   APPEARANCEUR CLOUDY (A) 06/04/2022 0520   APPEARANCEUR Cloudy (A) 04/13/2021 1530   LABSPEC 1.027 06/04/2022 0520   PHURINE 5.0  06/04/2022 0520   GLUCOSEU NEGATIVE 06/04/2022 0520   HGBUR NEGATIVE 06/04/2022 0520   BILIRUBINUR NEGATIVE 06/04/2022 0520   BILIRUBINUR Negative 04/13/2021 [place] 06/04/2022 0520   PROTEINUR 100 (A) 06/04/2022 0520   NITRITE NEGATIVE 06/04/2022 0520   LEUKOCYTESUR SMALL (A) 06/04/2022 0520    Recent Results (from the past 240 hour(s))  Cath Tip Culture     Status: None   Collection Time: 06/03/22  8:07 PM   Specimen: Catheter Tip  Result Value Ref Range Status   Specimen Description CATH TIP  Final   Special Requests NONE  Final   Culture   Final    NO GROWTH 3 DAYS Performed at [facility], [place] [address], [place], [place] [address]    Report Status 06/09/2022 FINAL  Final   Culture, blood (Routine x 2)     Status: Abnormal   Collection Time: 06/03/22  8:45 PM   Specimen: BLOOD  Result Value Ref Range Status   Specimen Description   Final    BLOOD LEFT ANTECUBITAL Performed at [facility], [place] [address], [place], [place] [address]    Special Requests   Final    BOTTLES DRAWN AEROBIC AND ANAEROBIC Blood Culture adequate volume Performed at [facility], [address], [place], [place] [address]    Culture  Setup Time   Final    YEAST BOTH Gram Stain Report Called to,Read Back By and Verified With: JACOBS, U'@0745'$  BY MATTHEWS, B 7.4.2023 CRITICAL RESULT CALLED TO, READ BACK BY AND VERIFIED WITH: PHARMD S.HURTZ AT 1130 ON 06/06/2022 BY T.SAAD.    Culture (A)  Final    CANDIDA GLABRATA Sent to [place] for further susceptibility testing. Performed at [facility], [place] [address], [place],  [address]    Report Status 06/09/2022 FINAL  Final  Blood Culture ID Panel (Reflexed)     Status: Abnormal   Collection Time: 06/03/22  8:45 PM  Result Value Ref Range Status   Enterococcus faecalis NOT DETECTED NOT DETECTED Final   Enterococcus Faecium NOT DETECTED NOT DETECTED Final   Listeria monocytogenes NOT DETECTED NOT DETECTED Final   Staphylococcus species NOT DETECTED NOT DETECTED Final   Staphylococcus aureus (BCID) NOT DETECTED NOT DETECTED Final   Staphylococcus epidermidis NOT DETECTED NOT DETECTED Final   Staphylococcus lugdunensis NOT DETECTED NOT DETECTED Final   Streptococcus species NOT DETECTED NOT DETECTED Final   Streptococcus agalactiae NOT DETECTED NOT DETECTED Final   Streptococcus pneumoniae NOT DETECTED NOT DETECTED Final   Streptococcus pyogenes NOT DETECTED NOT DETECTED Final   A.calcoaceticus-baumannii NOT DETECTED NOT DETECTED Final   Bacteroides fragilis NOT DETECTED NOT DETECTED Final   Enterobacterales NOT DETECTED NOT DETECTED Final   Enterobacter cloacae complex NOT DETECTED NOT DETECTED Final   Escherichia  coli NOT DETECTED NOT DETECTED Final   Klebsiella aerogenes NOT DETECTED NOT DETECTED Final   Klebsiella oxytoca NOT DETECTED NOT DETECTED Final   Klebsiella pneumoniae NOT DETECTED NOT DETECTED Final   Proteus species NOT DETECTED NOT DETECTED Final   Salmonella species NOT DETECTED NOT DETECTED Final   Serratia marcescens NOT DETECTED NOT DETECTED Final   Haemophilus influenzae NOT DETECTED NOT DETECTED Final   Neisseria meningitidis NOT DETECTED NOT DETECTED Final   Pseudomonas aeruginosa NOT DETECTED NOT DETECTED Final   Stenotrophomonas maltophilia NOT DETECTED NOT DETECTED Final   Candida albicans NOT DETECTED NOT DETECTED Final   Candida auris NOT DETECTED NOT DETECTED Final  Candida glabrata DETECTED (A) NOT DETECTED Final    Comment: CRITICAL RESULT CALLED TO, READ BACK BY AND VERIFIED WITH: PHARMD S.HURTZ AT 1130 ON 06/06/2022 BY T.SAAD.    Candida krusei NOT DETECTED NOT DETECTED Final   Candida parapsilosis NOT DETECTED NOT DETECTED Final   Candida tropicalis NOT DETECTED NOT DETECTED Final   Cryptococcus neoformans/gattii NOT DETECTED NOT DETECTED Final    Comment: Performed at [facility], [place] [address] [place], [place] [address]  Antifungal AST 9 Drug Panel     Status: None   Collection Time: 06/03/22  8:45 PM  Result Value Ref Range Status   Organism ID, Yeast Candida glabrata  Corrected    Comment: (NOTE) Identification performed by account, not confirmed by this laboratory. CORRECTED ON 07/09 AT 1535: PREVIOUSLY REPORTED AS Preliminary report    Amphotericin B MIC 1.0 ug/mL  Final    Comment: (NOTE) Breakpoints have been established for only some organism-drug combinations as indicated. This test was developed and its performance characteristics determined by [facility]. It has not been cleared or approved by the Food and Drug Administration.    Anidulafungin MIC Comment  Final    Comment: (NOTE) 0.12 ug/mL Susceptible Breakpoints have been  established for only some organism-drug combinations as indicated. This test was developed and its performance characteristics determined by [facility]. It has not been cleared or approved by the Food and Drug Administration.    Caspofungin MIC Comment  Final    Comment: (NOTE) 0.25 ug/mL Intermediate Breakpoints have been established for only some organism-drug combinations as indicated. This test was developed and its performance characteristics determined by [facility]. It has not been cleared or approved by the Food and Drug Administration.    Micafungin MIC Comment  Final    Comment: (NOTE) 0.016 ug/mL Susceptible Breakpoints have been established for only some organism-drug combinations as indicated. This test was developed and its performance characteristics determined by [facility]. It has not been cleared or approved by the Food and Drug Administration.    Posaconazole MIC 2.0 ug/mL  Final    Comment: (NOTE) Breakpoints have been established for only some organism-drug combinations as indicated. This test was developed and its performance characteristics determined by [facility]. It has not been cleared or approved by the Food and Drug Administration.    Fluconazole Islt MIC 16.0 ug/mL  Final    Comment: (NOTE) Susceptible Dose Dependent Breakpoints have been established for only some organism-drug combinations as indicated. This test was developed and its performance characteristics determined by [facility]. It has not been cleared or approved by the Food and Drug Administration.    Flucytosine MIC 0.06 ug/mL or less  Final    Comment: (NOTE) Breakpoints have been established for only some organism-drug combinations as indicated. This test was developed and its performance characteristics determined by [facility]. It has not been cleared or approved by the Food and Drug Administration.    Itraconazole MIC 1.0 ug/mL  Final    Comment: (NOTE) Breakpoints have been established  for only some organism-drug combinations as indicated. This test was developed and its performance characteristics determined by [facility]. It has not been cleared or approved by the Food and Drug Administration.    Voriconazole MIC 0.5 ug/mL  Final    Comment: (NOTE) Breakpoints have been established for only some organism-drug combinations as indicated. This test was developed and its performance characteristics determined by [facility]. It has not been cleared or approved by the Food and Drug Administration. Performed At:  [facility] [place] [address] [place], [place] [id number] Rush Farmer MD JI:[id number]    Source [phone] [facility] BLD SENSI Meta Hatchet  Final    Comment: Performed at [facility], [place] [address], [place], [place] [address]  Culture, blood (Routine x 2)     Status: Abnormal   Collection Time: 06/03/22  9:32 PM   Specimen: BLOOD  Result Value Ref Range Status   Specimen Description   Final    BLOOD LEFT ANTECUBITAL Performed at [facility], [place] [address] [place], [place] [address]    Special Requests   Final    BOTTLES DRAWN AEROBIC AND ANAEROBIC Blood Culture adequate volume Performed at  [facility], [address], [place], [place] [address]    Culture  Setup Time   Final    AEROBIC BOTTLE ONLY YEAST Gram Stain Report Called to,Read Back By and Verified With: UTE JACOBS @ 1200 ON 06/06/22 C VARNER CRITICAL VALUE NOTED.  VALUE IS CONSISTENT WITH PREVIOUSLY REPORTED AND CALLED VALUE.    Culture (A)  Final    CANDIDA GLABRATA SEE SEPARATE REPORT Performed at [facility], [place] [address] [place], [place] [address]    Report Status 06/11/2022 FINAL  Final  MRSA Next Gen by PCR, Nasal     Status: None   Collection Time: 06/04/22  2:18 AM   Specimen: Nasal Mucosa; Nasal Swab  Result Value Ref Range Status   MRSA by PCR Next Gen NOT DETECTED NOT DETECTED Final    Comment: (NOTE) The GeneXpert MRSA Assay (FDA approved for NASAL specimens  only), is one component of a comprehensive MRSA colonization surveillance program. It is not intended to diagnose MRSA infection nor to guide or monitor treatment for MRSA infections. Test performance is not FDA approved in patients less than 57 years old. Performed at [facility], [address], [place], [place] [address]   Urine Culture     Status: Abnormal   Collection Time: 06/04/22  2:28 AM   Specimen: PATH Cytology Urine  Result Value Ref Range Status   Specimen Description   Final    URINE, CLEAN CATCH Performed at [facility], [address], [place], [place] [address]    Special Requests   Final    NONE Performed at [facility], [address] [place], [place] [address]    Culture (A)  Final    <10,000 COLONIES/mL INSIGNIFICANT GROWTH Performed at [place] [address], [place], [place] [address]    Report Status 06/05/2022 FINAL  Final  Aerobic/Anaerobic Culture w Gram Stain (surgical/deep wound)     Status: None   Collection Time: 06/04/22  3:03 AM   Specimen: PATH Cytology FNA; Body Fluid  Result Value Ref Range Status   Specimen Description   Final    ANAL Performed at [facility], [address], [place], [place] [address]    Special Requests   Final    NONE Performed at [facility], [address] [place],  [address]    Gram Stain   Final    MODERATE WBC PRESENT,BOTH PMN AND MONONUCLEAR FEW GRAM POSITIVE COCCI IN PAIRS MODERATE GRAM POSITIVE RODS    Culture   Final    FEW CANDIDA ALBICANS NO ANAEROBES ISOLATED Performed at [facility], [place] [address] [place],  [address]    Report Status 06/09/2022 FINAL  Final  Culture, blood (Routine X 2) w Reflex to ID Panel  Status: None   Collection Time: 06/06/22  9:32 AM   Specimen: BLOOD LEFT WRIST  Result Value Ref Range Status   Specimen Description BLOOD LEFT WRIST  Final   Special Requests   Final    BOTTLES DRAWN AEROBIC ONLY Blood Culture results may not be  optimal due to an inadequate volume of blood received in culture bottles   Culture   Final    NO GROWTH 5 DAYS Performed at [facility], [address], [place], [place] [address]    Report Status 06/11/2022 FINAL  Final  Culture, blood (Routine X 2) w Reflex to ID Panel     Status: None   Collection Time: 06/06/22  9:33 AM   Specimen: Left Antecubital; Blood  Result Value Ref Range Status   Specimen Description LEFT ANTECUBITAL  Final   Special Requests   Final    BOTTLES DRAWN AEROBIC AND ANAEROBIC Blood Culture adequate volume   Culture   Final    NO GROWTH 5 DAYS Performed at [facility], [address] [place], [place] [address]    Report Status 06/11/2022 FINAL  Final     Scheduled Meds:  amLODipine  2.5 mg Oral Daily   atorvastatin  20 mg Oral QPM   buPROPion  150 mg Oral BID   Chlorhexidine Gluconate Cloth  6 each Topical Daily   donepezil  10 mg Oral QHS   heparin  5,000 Units Subcutaneous Q8H   insulin aspart  0-9 Units Subcutaneous Q6H   mouth rinse  15 mL Mouth Rinse Q4H   pantoprazole (PROTONIX) IV  40 mg Intravenous Q12H   sodium chloride flush  10-40 mL Intracatheter Q12H   venlafaxine  37.5 mg Oral BID   Continuous Infusions:  ampicillin-sulbactam (UNASYN) IV 3 g (06/13/22 1237)   dextrose 5 % and 0.45 % NaCl with KCl 10 mEq/L     micafungin (MYCAMINE) 100 mg in sodium chloride 0.9 % 100 mL IVPB 100 mg (06/12/22 1310)   TPN ADULT (ION) 35 mL/hr at 06/12/22 1839   TPN ADULT (ION)      Procedures/Studies: US Abdomen Limited RUQ (LIVER/GB)  Result Date: 06/12/2022 CLINICAL DATA:  Hyperbilirubinemia EXAM: ULTRASOUND ABDOMEN LIMITED RIGHT UPPER QUADRANT COMPARISON:  CT abdomen and pelvis 06/11/2022 FINDINGS: Gallbladder: Sludge within gallbladder. No definite shadowing calculi. Mild gallbladder wall thickening. No sonographic Murphy sign. Common bile duct: Diameter: 3 mm, normal Liver: Normal echogenicity without mass or nodularity. No intrahepatic biliary  dilatation. Portal vein is patent on color Doppler imaging with normal direction of blood flow towards the liver. Other: Perihepatic ascites identified, containing septations/loculations. IMPRESSION: Sludge within gallbladder with mild gallbladder wall thickening, a nonspecific finding in the setting of ascites. No biliary dilatation or focal hepatic abnormality. Electronically Signed   By: Lavonia Dana M.D.   On: 06/12/2022 14:40   [place] EKG SITE RITE  Result Date: 06/11/2022 If Site Rite image not attached, placement could not be confirmed due to current cardiac rhythm.  CT ABDOMEN PELVIS WO CONTRAST  Result Date: 06/11/2022 CLINICAL DATA:  EXPLORATORY LAPAROTOMY, omental patchImpression: Worsening leukocytosis. EXAM: CT ABDOMEN AND PELVIS WITHOUT CONTRAST TECHNIQUE: Multidetector CT imaging of the abdomen and pelvis was performed following the standard protocol without IV contrast. RADIATION DOSE REDUCTION: This exam was performed according to the departmental dose-optimization program which includes automated exposure control, adjustment of the mA and/or kV according to patient size and/or use of iterative reconstruction technique. COMPARISON:  06/03/2022. FINDINGS: Lower chest: Moderate pleural effusions. Dependent  lower lobe and right middle lobe opacity, likely atelectasis. Additional ground-glass opacities are noted, most evident in the right middle lobe, suspicious for infection. Effusions are increased on the left and new on the right since the prior CT. Lung base opacities are mostly new. Hepatobiliary: No focal liver abnormality is seen. No gallstones, gallbladder wall thickening, or biliary dilatation. Pancreas: Unremarkable. No pancreatic ductal dilatation or surrounding inflammatory changes. Spleen: Normal in size without focal abnormality. Adrenals/Urinary Tract: No adrenal masses. No renal masses or hydronephrosis. Bladder is unremarkable. Stomach/Bowel: Stomach is mostly decompressed. There is an  apparent defect along the anterior aspect of the mid to distal stomach through which contrast and air has collected. The ill-defined collection of contrast and air lies in the mid to upper abdomen near the midline, with an adjacent surgical drain, and deep to the midline laparotomy incision. Poorly defined small bowel loops in the central abdomen. There is no colon or small bowel dilation to suggest obstruction. No colonic wall thickening or convincing inflammation. Vascular/Lymphatic: Aortic atherosclerosis. Prominent lymph nodes noted along the gastrohepatic ligament. There are no enlarged lymph nodes. Reproductive: Status post hysterectomy. No adnexal masses. Other: Small amount of ascites. Generalized increased attenuation of the mesenteric/peritoneal fat consistent with edema. Diffuse subcutaneous soft tissue edema. Musculoskeletal: No acute finding. IMPRESSION: 1. There is no defined fluid collection to indicate an abscess. 2. There is extraluminal air and contrast in the anterior central to upper abdomen, that appears to be arising from a defect along the anterior gastric wall. The extraluminal contrast and air lies adjacent to the surgical drain. 3. Small amount of ascites is similar to the previous CT. 4. Mesenteric/peritoneal and diffuse subcutaneous edema. 5. Moderate pleural effusions with associated lung base atelectasis. Additional areas of ground-glass lung opacity are suspicious for multifocal pneumonia. Electronically Signed   By: Lajean Manes M.D.   On: 06/11/2022 12:56   DG CHEST PORT 1 VIEW  Result Date: 06/09/2022 CLINICAL DATA:  Weakness, wheezing EXAM: PORTABLE CHEST 1 VIEW COMPARISON:  06/05/2022 FINDINGS: Interval endotracheal and esophagogastric extubation. Small to moderate bilateral pleural effusions, increased compared to prior examination, with associated atelectasis or consolidation and diffuse bilateral interstitial pulmonary opacity. Cardiomegaly. IMPRESSION: 1. Small to moderate  bilateral pleural effusions, increased compared to prior examination, with associated atelectasis or consolidation and diffuse bilateral interstitial pulmonary opacity. Findings are most consistent with worsened edema. 2. Interval endotracheal and esophagogastric extubation. 3. Cardiomegaly. Electronically Signed   By: Delanna Ahmadi M.D.   On: 06/09/2022 12:25   DG UGI W SMALL BOWEL  Result Date: 06/08/2022 CLINICAL DATA:  Post repair of 1.5 cm diameter perforated anterior wall gastric antral ulcer EXAM: WATER SOLUBLE UPPER GI SERIES TECHNIQUE: Single-column upper GI series was performed using water soluble contrast. Radiation Exposure Index (as provided by the fluoroscopic device): 43.1 mGy Kerma CONTRAST:  100 cc Omnipaque 300 via NG tube COMPARISON:  CT abdomen and pelvis 06/03/2022 FLUOROSCOPY: Fluoroscopy Time:  2 minutes 36 seconds Radiation Exposure Index (if provided by the fluoroscopic device): 43.1 Number of Acquired Spot Images: Multiple fluoroscopic screen captures and cine series FINDINGS: Contrast administered via NG tube opacifies the gastric lumen. Patent pylorus with passage of contrast through normal appearing duodenal C loop into proximal jejunum with incidentally noted duodenal diverticulum at second portion. Irregularity of rugal folds with thickening and distortion at anterior wall of gastric antrum at site of perforation repair. The third portion of the duodenum is superimposed with the inferior wall of the gastric antrum, mildly  limiting exam. A blush of contrast is seen inferior to the antrum, superimposed with the third portion of the duodenum throughout the study. This appears to move in concert with the duodenum with respiration. No contrast opacification of the JP drain. No gross evidence of contrast leak identified though it is difficult to completely exclude a small leak at the site of repair. IMPRESSION: Distortion and thickening of rugal folds at site of antral gastric ulcer  repair. No gross evidence of leak at the site of ulcer repair as discussed above. Electronically Signed   By: Lavonia Dana M.D.   On: 06/08/2022 15:32   ECHOCARDIOGRAM COMPLETE  Result Date: 06/07/2022    ECHOCARDIOGRAM REPORT   Patient Name:   KYANNE RIALS Date of Exam: 06/07/2022 Medical Rec #:  [id number]       Height:       61.0 in Accession #:    [id number]      Weight:       112.2 lb Date of Birth:  1946/01/12        BSA:          1.477 m Patient Age:    45 years        BP:           116/46 mmHg Patient Gender: F               HR:           104 bpm. Exam Location:  [facility] Procedure: 2D Echo, Cardiac Doppler and Color Doppler Indications:    Bacteremia  History:        Patient has no prior history of Echocardiogram examinations.                 Signs/Symptoms:Bacteremia; Risk Factors:Hypertension and                 Dyslipidemia. Breast CA.  Sonographer:    Wenda Low Referring Phys: [id number] [place]  1. Abnormal septal motion . Left ventricular ejection fraction, by estimation, is 55 to 60%. The left ventricle has normal function. The left ventricle has no regional wall motion abnormalities. Left ventricular diastolic parameters were normal.  2. Right ventricular systolic function is normal. The right ventricular size is normal. There is mildly elevated pulmonary artery systolic pressure.  3. Moderate pleural effusion in the left lateral region.  4. The mitral valve is abnormal. Trivial mitral valve regurgitation. No evidence of mitral stenosis.  5. Thickening and calcification noted . The tricuspid valve is abnormal.  6. Some thickening in the LVOT near intervalvular fibrosa Given this and nodular calcification of PV suggest TEE if suspicion for SBE high. The aortic valve is tricuspid. There is moderate calcification of the aortic valve. There is moderate thickening of the aortic valve. Aortic valve regurgitation is mild. Aortic valve sclerosis/calcification is present, without any  evidence of aortic stenosis.  7. Nodular calcification seen on PV. The pulmonic valve was abnormal.  8. The inferior vena cava is normal in size with greater than 50% respiratory variability, suggesting right atrial pressure of 3 mmHg. FINDINGS  Left Ventricle: Abnormal septal motion. Left ventricular ejection fraction, by estimation, is 55 to 60%. The left ventricle has normal function. The left ventricle has no regional wall motion abnormalities. The left ventricular internal cavity size was normal in size. There is no left ventricular hypertrophy. Left ventricular diastolic parameters were normal. Right Ventricle: The right ventricular size is normal. No increase in  right ventricular wall thickness. Right ventricular systolic function is normal. There is mildly elevated pulmonary artery systolic pressure. The tricuspid regurgitant velocity is 3.21  m/s, and with an assumed right atrial pressure of 3 mmHg, the estimated right ventricular systolic pressure is 93.7 mmHg. Left Atrium: Left atrial size was normal in size. Right Atrium: Right atrial size was normal in size. Pericardium: There is no evidence of pericardial effusion. Mitral Valve: The mitral valve is abnormal. There is mild thickening of the mitral valve leaflet(s). There is mild calcification of the mitral valve leaflet(s). Mild mitral annular calcification. Trivial mitral valve regurgitation. No evidence of mitral valve stenosis. MV peak gradient, 3.9 mmHg. The mean mitral valve gradient is 1.0 mmHg. Tricuspid Valve: Thickening and calcification noted. The tricuspid valve is abnormal. Tricuspid valve regurgitation is trivial. No evidence of tricuspid stenosis. Aortic Valve: Some thickening in the LVOT near intervalvular fibrosa Given this and nodular calcification of PV suggest TEE if suspicion for SBE high. The aortic valve is tricuspid. There is moderate calcification of the aortic valve. There is moderate thickening of the aortic valve. Aortic valve  regurgitation is mild. Aortic valve sclerosis/calcification is present, without any evidence of aortic stenosis. Aortic valve mean gradient measures 5.0 mmHg. Aortic valve peak gradient measures 10.3 mmHg. Aortic valve area, by VTI measures 2.02 cm. Pulmonic Valve: Nodular calcification seen on PV. The pulmonic valve was abnormal. Pulmonic valve regurgitation is trivial. No evidence of pulmonic stenosis. Aorta: The aortic root is normal in size and structure. Venous: The inferior vena cava is normal in size with greater than 50% respiratory variability, suggesting right atrial pressure of 3 mmHg. IAS/Shunts: No atrial level shunt detected by color flow Doppler. Additional Comments: There is a moderate pleural effusion in the left lateral region.  LEFT VENTRICLE PLAX 2D LVIDd:         4.50 cm     Diastology LVIDs:         3.00 cm     LV e' medial:    7.29 cm/s LV PW:         0.80 cm     LV E/e' medial:  11.8 LV IVS:        0.80 cm     LV e' lateral:   9.79 cm/s LVOT diam:     1.80 cm     LV E/e' lateral: 8.8 LV SV:         55 LV SV Index:   37 LVOT Area:     2.54 cm  LV Volumes (MOD) LV vol d, MOD A2C: 43.7 ml LV vol d, MOD A4C: 52.7 ml LV vol s, MOD A2C: 17.3 ml LV vol s, MOD A4C: 23.8 ml LV SV MOD A2C:     26.4 ml LV SV MOD A4C:     52.7 ml LV SV MOD BP:      28.6 ml RIGHT VENTRICLE RV Basal diam:  3.10 cm RV Mid diam:    2.90 cm RV S prime:     14.50 cm/s TAPSE (M-mode): 3.0 cm LEFT ATRIUM             Index        RIGHT ATRIUM           Index LA diam:        3.60 cm 2.44 cm/m   RA Area:     11.50 cm LA Vol (A2C):   50.9 ml 34.45 ml/m  RA Volume:   21.80 ml  14.76 ml/m LA Vol (A4C):   43.7 ml 29.58 ml/m LA Biplane Vol: 49.8 ml 33.71 ml/m  AORTIC VALVE                     PULMONIC VALVE AV Area (Vmax):    1.92 cm      PV Vmax:       0.98 m/s AV Area (Vmean):   1.84 cm      PV Peak grad:  3.9 mmHg AV Area (VTI):     2.02 cm AV Vmax:           160.50 cm/s AV Vmean:          103.550 cm/s AV VTI:             0.274 m AV Peak Grad:      10.3 mmHg AV Mean Grad:      5.0 mmHg LVOT Vmax:         121.00 cm/s LVOT Vmean:        74.800 cm/s LVOT VTI:          0.218 m LVOT/AV VTI ratio: 0.79  AORTA Ao Root diam: 3.30 cm MITRAL VALVE               TRICUSPID VALVE MV Area (PHT): 6.02 cm    TR Peak grad:   41.2 mmHg MV Area VTI:   2.25 cm    TR Vmax:        321.00 cm/s MV Peak grad:  3.9 mmHg MV Mean grad:  1.0 mmHg    SHUNTS MV Vmax:       0.99 m/s    Systemic VTI:  0.22 m MV Vmean:      48.8 cm/s   Systemic Diam: 1.80 cm MV Decel Time: 126 msec MV E velocity: 86.00 cm/s MV A velocity: 72.70 cm/s MV E/A ratio:  1.18 Jenkins Rouge MD Electronically signed by Jenkins Rouge MD Signature Date/Time: 06/07/2022/10:28:36 AM    Final    DG CHEST PORT 1 VIEW  Result Date: 06/05/2022 CLINICAL DATA:  76 year old female with respiratory failure. Intubated. EXAM: PORTABLE CHEST 1 VIEW COMPARISON:  Portable chest 06/04/2022 and earlier. FINDINGS: Portable AP semi upright view at 0527 hours. Endotracheal tube tip in good position between the clavicles and carina. Stable left IJ central line and visible enteric tube. Increased left lung base and retrocardiac opacity now mostly obscuring the left hemidiaphragm. Slightly lower lung volumes overall. Mediastinal contours remain normal. No pneumothorax, pulmonary edema, or definite effusion. Negative right lung. Partially visible midline abdominal skin staples. Paucity of bowel gas in the upper abdomen. Stable left chest and axillary surgical clips. IMPRESSION: 1. Stable lines and tubes. 2. Increased left lower lobe collapse or consolidation since yesterday. Electronically Signed   By: Genevie Ann M.D.   On: 06/05/2022 08:26   DG Chest Port 1 View  Result Date: 06/04/2022 CLINICAL DATA:  Endotracheal tube, nasogastric tube and central line placement. EXAM: PORTABLE CHEST 1 VIEW COMPARISON:  June 03, 2022 FINDINGS: Since the prior study there has been interval placement of an endotracheal tube. Its  distal tip is seen approximately 3.8 cm from the carina. Interval nasogastric tube placement is also seen. Its distal and extends into the gastric lumen. There is a left internal jugular venous catheter with its distal tip overlying the distal superior vena cava. This is approximately 6 mm proximal to the junction of the superior vena cava and right atrium. The heart size and  mediastinal contours are within normal limits. Mild to moderate severity atelectasis and/or infiltrate is seen within the left lung base. This represents a new finding when compared to the prior study. There is no evidence of a pleural effusion or pneumothorax. Radiopaque surgical clips are seen along the left axilla. The air seen below the right hemidiaphragm on the prior study is no longer visualized. The visualized skeletal structures are unremarkable. IMPRESSION: 1. Interval endotracheal tube, nasogastric tube and left internal jugular venous catheter placement positioning, as described above. 2. Mild to moderate severity left basilar atelectasis and/or infiltrate. Electronically Signed   By: Virgina Norfolk M.D.   On: 06/04/2022 02:47   CT Abdomen Pelvis Wo Contrast  Result Date: 06/03/2022 CLINICAL DATA:  Severe weakness for 2 days with acute abdominal pain, initial encounter EXAM: CT ABDOMEN AND PELVIS WITHOUT CONTRAST TECHNIQUE: Multidetector CT imaging of the abdomen and pelvis was performed following the standard protocol without IV contrast. RADIATION DOSE REDUCTION: This exam was performed according to the departmental dose-optimization program which includes automated exposure control, adjustment of the mA and/or kV according to patient size and/or use of iterative reconstruction technique. COMPARISON:  None Available. FINDINGS: Lower chest: Basilar atelectatic changes are noted. Hepatobiliary: Liver is within normal limits. Gallbladder demonstrates a normal appearance. Pancreas: Unremarkable. No pancreatic ductal dilatation  or surrounding inflammatory changes. Spleen: Normal in size without focal abnormality. Adrenals/Urinary Tract: Adrenal glands are within normal limits. Kidneys demonstrate tiny nonobstructing stones on right in the lower pole. No ureteral obstruction is seen. The bladder is decompressed. Stomach/Bowel: Considerable fecal material is noted within the rectal vault which may represent a focal impaction. Diverticular change of the colon is noted as well as some generalized wall thickening throughout the colon. The appendix is within normal limits. Small bowel is within normal limits. Stomach demonstrates a defect in the anterior gastric wall consistent with a perforated ulcer. There is a considerable amount of air and fluid throughout the abdomen with apparent direct communication with the gastric best seen on image number 36 of series 2 and image number 78 of series 6. Vascular/Lymphatic: Aortic atherosclerosis. No enlarged abdominal or pelvic lymph nodes. Reproductive: Status post hysterectomy. No adnexal masses. Other: Free air and free fluid similar to that described above throughout the abdomen. The bowel demonstrates some reactive wall thickening related to the fluid and air. Musculoskeletal: Degenerative changes of the lumbar spine are seen. No other bony abnormality is noted. IMPRESSION: Changes consistent with a perforated gastric ulcer anteriorly with considerable spillage of fluid and air into the abdominal cavity. Some reactive wall thickening is noted within the bowel loops secondary to these changes. Critical Value/emergent results were called by telephone at the time of interpretation on 06/03/2022 at 10:43 pm to Dr. Aletta Edouard , who verbally acknowledged these results. Electronically Signed   By: Inez Catalina M.D.   On: 06/03/2022 22:45   DG Chest Port 1 View  Result Date: 06/03/2022 CLINICAL DATA:  Weakness. EXAM: PORTABLE CHEST 1 VIEW COMPARISON:  None Available. FINDINGS: Multiple overlying  radiopaque cardiac lead wires are seen. The heart size and mediastinal contours are within normal limits. Mild, diffuse, chronic appearing increased lung markings are seen. There is no evidence of an acute infiltrate, pleural effusion or pneumothorax. Radiopaque surgical clips are seen along the lateral aspect of the left chest wall. A crescentic area of air is seen just below the right hemidiaphragm. Degenerative changes seen throughout the thoracic spine. IMPRESSION: 1. Chronic appearing increased lung markings without  evidence of acute or active cardiopulmonary disease. 2. Air seen just below the right hemidiaphragm which may represent an air filled loop of colon. Further evaluation with abdomen pelvis CT is recommended, as the presence of intra-abdominal free air cannot be excluded. Electronically Signed   By: Virgina Norfolk M.D.   On: 06/03/2022 21:22    Time spent: 35 mins   Vann Okerlund Wynetta Emery, MD How to contact the [facility] Attending or Consulting provider [place] or covering provider during after hours [place], for this patient?  Check the care team in [facility] and look for a) attending/consulting [facility] provider listed and b) the [facility] team listed Log into [email or web address] and use [place]'s universal password to access. If you do not have the password, please contact the hospital operator. Locate the [facility] provider you are looking for under [facility] and page to a number that you can be directly reached. If you still have difficulty reaching the provider, please page the [facility] (Director on Call) for the Hospitalists listed on amion for assistance.   [facility]  If 7PM-7AM, please contact night-coverage [email or web address] Password [email or web address] 06/13/2022, 1:22 PM   LOS: 9 days

## 2022-06-14 ENCOUNTER — Encounter: Payer: Self-pay | Admitting: Internal Medicine

## 2022-06-14 DIAGNOSIS — A419 Sepsis, unspecified organism: Secondary | ICD-10-CM | POA: Diagnosis not present

## 2022-06-14 DIAGNOSIS — R6521 Severe sepsis with septic shock: Secondary | ICD-10-CM | POA: Diagnosis not present

## 2022-06-14 LAB — CBC WITH DIFFERENTIAL/PLATELET
Abs Immature Granulocytes: 0.29 10*3/uL — ABNORMAL HIGH (ref 0.00–0.07)
Basophils Absolute: 0.1 10*3/uL (ref 0.0–0.1)
Basophils Relative: 0 %
Eosinophils Absolute: 0.3 10*3/uL (ref 0.0–0.5)
Eosinophils Relative: 1 %
HCT: 36.3 % (ref 36.0–46.0)
Hemoglobin: 11.9 g/dL — ABNORMAL LOW (ref 12.0–15.0)
Immature Granulocytes: 1 %
Lymphocytes Relative: 6 %
Lymphs Abs: 1.5 10*3/uL (ref 0.7–4.0)
MCH: 30.3 pg (ref 26.0–34.0)
MCHC: 32.8 g/dL (ref 30.0–36.0)
MCV: 92.4 fL (ref 80.0–100.0)
Monocytes Absolute: 1.1 10*3/uL — ABNORMAL HIGH (ref 0.1–1.0)
Monocytes Relative: 4 %
Neutro Abs: 22.4 10*3/uL — ABNORMAL HIGH (ref 1.7–7.7)
Neutrophils Relative %: 88 %
Platelets: 354 10*3/uL (ref 150–400)
RBC: 3.93 MIL/uL (ref 3.87–5.11)
RDW: 14.3 % (ref 11.5–15.5)
WBC: 25.5 10*3/uL — ABNORMAL HIGH (ref 4.0–10.5)
nRBC: 0 % (ref 0.0–0.2)

## 2022-06-14 LAB — GLUCOSE, CAPILLARY
Glucose-Capillary: 217 mg/dL — ABNORMAL HIGH (ref 70–99)
Glucose-Capillary: 166 mg/dL — ABNORMAL HIGH (ref 70–99)
Glucose-Capillary: 189 mg/dL — ABNORMAL HIGH (ref 70–99)

## 2022-06-14 MED ORDER — TRAVASOL 10 % IV SOLN
INTRAVENOUS | Status: AC
  Filled 2022-06-14: qty 705.6

## 2022-06-14 NOTE — Progress Notes (Signed)
PHARMACY - TOTAL PARENTERAL NUTRITION CONSULT NOTE   Indication: gastric perforation  Patient Measurements: Height: '5\' 1"'$  (154.9 cm) Weight: 54.9 kg (121 lb 0.5 oz) (removed pillows except one/scd machine) IBW/kg (Calculated) : 47.8 TPN AdjBW (KG): 50.9 Body mass index is 22.87 kg/m.  Assessment:  Pharmacy consulted to dose TPN in patient with gastric perforation.  Glucose / Insulin:    143-217- 7units Electrolytes: K 3.0> 3.5 , mag 1.6> 2.3 Renal: Scr 1.44> 1.37  Hepatic: albumin <1.5 Tbili 4.6 TG 230 Intake / Output; MIVF: D51/2NS w/ 10 mEq/L KCl @ 41m/hr Inaccurate I/O  GI Surgeries / Procedures: Exploratory laparotomy, gastrorrhaphy  Central access: PICC double lumen 7/10 TPN start date: 7/10  Nutritional Goals: Goal TPN rate is 70 mL/hr (provides 70 g of protein and 1300 kcals per day)  RD Assessment: Kcal:  1100-1300\  Protein:  75-78 gr  Fluid:  >/= 1600 ml daily  Current Nutrition:  NPO  Plan:  Continue TPN at 70 mL/hr at 1800 Electrolytes in TPN: Na 575m/L, K 5086mL, Ca 5mE36m, Mg 5mEq46m and Phos 5mmol79m Cl:Ac 1:1 Add standard MVI and trace elements to TPN Continue Sensitive q6h SSI and adjust as needed  Decrease IV fluids to 10 mL/hr @ 1800 KVO Monitor TPN labs on Mon/Thurs,   Lama Narayanan Isac Sarnaharm D, BCPS Clinical Pharmacist 06/14/2022 8:32 AM

## 2022-06-14 NOTE — Progress Notes (Addendum)
ID Brief Note  Remains afebrile,  WBC trending down No CMP available  On TPN through PICC placed on 7/10 Some reported nausea but no vomiting  JP drain with dark brown fluid, 450 ml 7 am yesterday to 7 am today   Plan for repeat CT abd/pelvis 7/17 possibly  Continue Unasyn for now pending repeat CT abd/pelvis/high WBC. Can consider to DC antibiotics if nothing concerning of repeat imaging  No new recommendations regarding fungemia. Likely source is GI.   ID available as needed. Please recall if needed/questions.   Rosiland Oz, MD Infectious Disease Physician [facility] [address] [place], [place] [address] Phone: [phone]  Fax: [phone]

## 2022-06-14 NOTE — Progress Notes (Signed)
Patient at the beginning for shift was holding stomach, grimmising, and gagging with no production of vomit. Zofran given and relieved patient. Daughter was at bedside during the night and left early morining. In the night patient wanted to help to leave. Reoriented patient that she is in the hospital receiving care. The patient later fell asleep, and remained asleep until time for medications and vitals.

## 2022-06-14 NOTE — Progress Notes (Signed)
[facility]  Talked to family this evening. They were very concerned and wanted to talked. Showed then the scan. Told them patient doing fair overall and making some improvements.  Delirium precautions. Not unreasonable to get palliative involved to discuss goals of care. I still think she is likely to heal and overall things are slowing improving.  Palliative ordered to discuss goals of care like DNR. When I discussed that the family said full code for now.  I do not think it is unreasonable for another pair of eyes to look at the patient to ensure we are doing the best for Ms. Seman.  Curlene Labrum, MD [facility] [address] [place],  [id number] [phone] (office)

## 2022-06-14 NOTE — Progress Notes (Signed)
PROGRESS NOTE  Carolyn Erickson OEV:[id number] DOB: 01-Oct-1946 DOA: 06/03/2022 PCP: Dettinger, Fransisca Kaufmann, MD  Brief History:  76 year old female with history of major neurocognitive disorder, hypertension, hyperlipidemia, depression, left breast cancer presenting with generalized weakness and abdominal pain.  Apparently, the patient has been having abdominal pain since April 2023.  The patient had been started on pantoprazole at that time.  There was no improvement.  She followed up with her PCP on 06/02/2022.  Referral was given to see gastroenterology.  Abdominal pain had worsened over the past 2 days prior to admission with worsening generalized weakness.  There is no fevers, chills, chest pain, shortness breath, vomiting, diarrhea.  There is no hematochezia or melena noted.  In the emergency department, the patient was hypotensive with a blood pressure of 71/54.  She was somnolent.  CT of the abdomen and pelvis was performed and showed changes consistent with a perforated gastric ulcer.  Dr. Constance Haw was consulted, and the patient was taken to the OR for exploratory laparotomy, gastrorrhaphy, omental patch.  A left IJ central line was also placed.  TRH is consulted for medical management  In the ED, the patient had low-grade fever 99.5 F.  She was tachycardic and hypotensive with blood pressure 68/53.  WBC 4.1, hemoglobin 14.6, platelets 308,000.  BMP showed sodium 137, potassium 4.1, bicarbonate 22, serum creatinine 2.14.  Postoperatively, the patient was started on Zosyn and fluconazole.  She remained on the ventilator.  Preoperative chest x-ray showed bibasilar atelectasis.  There was free air under the diaphragm.   Assessment and Plan: * Septic shock - RESOLVED Presented with hypotension, tachycardia, and lactic acid peaked at 6.8 Pt successfully weaned off norepinephrine infusion Secondary to gastric perforation with peritonitis 06/03/22 blood cultures--C. glabrata UA 11-20 WBC>>urine  culture unrevealing Continue IV antibiotics per ID  discontinued fluconazole per ID, continue echinocandin (micafungin)  Gastric perforation ([place]) Suspect this may be due to chronic NSAID use -Continue PPI twice daily -06/04/2022--ex laparotomy with omental patch -Postoperative care per Dr. Constance Haw -TNA therapy on hold temporarily due to need for CVL holiday -UGI 7/6 - no leak seen.  7/9 - CT c/w leak present - PICC line placed 7/10 and TPN started 7/10 - surgery planning to repeat CT on Monday 06/19/22  Fungemia 06/03/22 blood cultures--Candida glabrata -d/c fluconazole per ID consult -continue echinocandin per ID  -discussed with general surgery>>removed central line 7/4 for line holiday -repeat blood culture on 7/4 with no growth to date   Acute renal failure superimposed on stage 3a chronic kidney disease (HCC) -Serum creatinine peaked at 2.28 but trending down  -Secondary to hemodynamic changes/sepsis and hypovolemia -Monitor BMPs  Leukocytosis -- WBC trending down from 30 -- appreciate ID and surgery recommendations, continue antifungal and antibiotics  Hyperbilirubinemia -- secondary to persistent leak.  Continuing to monitor.   Volume overload -- treated with IV lasix 40 mg x 2 on 7/7 and resolved  Hypomagnesemia IV replacement ordered and repleted, continue monitoring   Acute respiratory failure with hypoxia (HCC) Currently PRVC, RR 20, Vt 380, FiO2 0.4 -7/3 personally reviewed CXR--increased interstitial marking -7/3 ABG--7.35/36/107/20 (0.4) -7/3--extubated -7/4-5--stable on 4L [place] -7/6 - down to 2L/min [place]  Major neurocognitive disorder [facility]) Patient had MoCa 17/30 on 03/14/22 -resumed donezepil, venlafaxine, bupropion 06/12/22  GERD (gastroesophageal reflux disease) Continue pantoprazole IV twice daily  Essential hypertension Resumed amlodipine 2.5 mg daily  Mixed hyperlipidemia -- resumed home atorvastatin   Family Communication:  Discussed with  husband at bedside  consultants:  general surgery  Code Status:  FULL   DVT Prophylaxis:  SCDs  Procedures: As Listed in Progress Note Above  Antibiotics: Fluconazole 7/2>>7/4 Micafungin 7/4>> Zosyn 7/2>>7/11 Unasyn 7/11>>  Subjective: -Husband at bedside, alert and interactive -No fevers  Objective: Vitals:   06/13/22 0606 06/13/22 1408 06/13/22 2231 06/14/22 1447  BP: 130/76 129/71 (!) 154/85 134/72  Pulse: 77 76 92 81  Resp: '20 19 20 18  '$ Temp: 97.8 F (36.6 C) 98 F (36.7 C) 98.1 F (36.7 C) 98.5 F (36.9 C)  TempSrc: Oral Oral Oral Oral  SpO2: 95% 96% 94% 100%  Weight:      Height:        Intake/Output Summary (Last 24 hours) at 06/14/2022 1935 Last data filed at 06/14/2022 1448 Gross per 24 hour  Intake 875.6 ml  Output 705 ml  Net 170.6 ml   Weight change:  Exam:  General:  awake, alert, hard of hearing, NAD, cooperative. moist mucus membranes.  HEENT: No icterus, No thrush, No neck mass, [place]/AT Cardiovascular: normal s1,s2 sounds, no m/r/g heard. Respiratory: bilateral BS shallow but clear, no rales, rare exp wheezing heard. Abdomen: soft, nondistended, no masses palpated. Normal BS, no significant tenderness Extremities: no clubbing or cyanosis, trace pretibial edema.  Data Reviewed: I have personally reviewed following labs and imaging studies Basic Metabolic Panel: Recent Labs  Lab 06/08/22 0344 06/09/22 0503 06/10/22 0549 06/11/22 0707 06/12/22 0514 06/13/22 0531  NA 140 144 146* 145 142 141  K 4.7 4.1 3.9 3.3* 3.0* 3.5  CL 111 112* 109 107 107 107  CO2 '23 26 29 30 31 29  '$ GLUCOSE 72 140* 112* 118* 129* 174*  BUN 47* 54* 54* 51* 40* 37*  CREATININE 1.82* 1.88* 1.97* 1.66* 1.44* 1.37*  CALCIUM 8.4* 8.9 9.2 9.6 9.2 9.2  MG 2.3 2.3 2.0  --  1.6* 2.3  PHOS 4.8*  --   --   --  3.5 3.5   Liver Function Tests: Recent Labs  Lab 06/09/22 0905 06/10/22 0549 06/11/22 0707 06/12/22 0514 06/13/22 0531  AST '21 22 30 27 27  '$ ALT '14 14 18 18  18  '$ ALKPHOS 89 91 122 99 92  BILITOT 3.2* 3.8* 4.8* 4.6* 4.6*  PROT 4.7* 4.7* 5.6* 4.7* 4.7*  ALBUMIN <1.5* <1.5* 1.8* <1.5* <1.5*   No results for input(s): "LIPASE", "AMYLASE" in the last 168 hours.  No results for input(s): "AMMONIA" in the last 168 hours. Coagulation Profile: No results for input(s): "INR", "PROTIME" in the last 168 hours.  CBC: Recent Labs  Lab 06/10/22 0549 06/11/22 0707 06/12/22 0514 06/13/22 0531 06/14/22 0608  WBC 25.6* 31.0* 30.3* 28.3* 25.5*  NEUTROABS 20.8* 25.8* 26.2* 24.7* 22.4*  HGB 12.4 13.4 11.4* 11.4* 11.9*  HCT 38.9 41.4 35.9* 35.4* 36.3  MCV 93.5 91.8 92.3 92.4 92.4  PLT 156 228 247 304 354   Cardiac Enzymes: No results for input(s): "CKTOTAL", "CKMB", "CKMBINDEX", "TROPONINI" in the last 168 hours. BNP: Invalid input(s): "POCBNP" CBG: Recent Labs  Lab 06/13/22 1119 06/13/22 1608 06/14/22 0512 06/14/22 1119 06/14/22 1801  GLUCAP 156* 143* 217* 166* 189*   HbA1C: No results for input(s): "HGBA1C" in the last 72 hours. Urine analysis:    Component Value Date/Time   COLORURINE AMBER (A) 06/04/2022 0520   APPEARANCEUR CLOUDY (A) 06/04/2022 0520   APPEARANCEUR Cloudy (A) 04/13/2021 1530   LABSPEC 1.027 06/04/2022 0520   PHURINE 5.0 06/04/2022 0520   GLUCOSEU  NEGATIVE 06/04/2022 0520   HGBUR NEGATIVE 06/04/2022 0520   BILIRUBINUR NEGATIVE 06/04/2022 0520   BILIRUBINUR Negative 04/13/2021 1530   KETONESUR NEGATIVE 06/04/2022 0520   PROTEINUR 100 (A) 06/04/2022 0520   NITRITE NEGATIVE 06/04/2022 0520   LEUKOCYTESUR SMALL (A) 06/04/2022 0520    Recent Results (from the past 240 hour(s))  Culture, blood (Routine X 2) w Reflex to ID Panel     Status: None   Collection Time: 06/06/22  9:32 AM   Specimen: BLOOD LEFT WRIST  Result Value Ref Range Status   Specimen Description BLOOD LEFT WRIST  Final   Special Requests   Final    BOTTLES DRAWN AEROBIC ONLY Blood Culture results may not be optimal due to an inadequate volume of  blood received in culture bottles   Culture   Final    NO GROWTH 5 DAYS Performed at [facility], [address] [place], [place] [address]    Report Status 06/11/2022 FINAL  Final  Culture, blood (Routine X 2) w Reflex to ID Panel     Status: None   Collection Time: 06/06/22  9:33 AM   Specimen: Left Antecubital; Blood  Result Value Ref Range Status   Specimen Description LEFT ANTECUBITAL  Final   Special Requests   Final    BOTTLES DRAWN AEROBIC AND ANAEROBIC Blood Culture adequate volume   Culture   Final    NO GROWTH 5 DAYS Performed at [facility], [address], [place], [place] [address]    Report Status 06/11/2022 FINAL  Final     Scheduled Meds:  amLODipine  2.5 mg Oral Daily   atorvastatin  20 mg Oral QPM   buPROPion  150 mg Oral BID   Chlorhexidine Gluconate Cloth  6 each Topical Daily   donepezil  10 mg Oral QHS   heparin  5,000 Units Subcutaneous Q8H   insulin aspart  0-9 Units Subcutaneous Q6H   mouth rinse  15 mL Mouth Rinse Q4H   pantoprazole (PROTONIX) IV  40 mg Intravenous Q12H   sodium chloride flush  10-40 mL Intracatheter Q12H   venlafaxine  37.5 mg Oral BID   Continuous Infusions:  ampicillin-sulbactam (UNASYN) IV 3 g (06/14/22 0927)   dextrose 5 % and 0.45 % NaCl with KCl 10 mEq/L 10 mL/hr at 06/13/22 1803   micafungin (MYCAMINE) 100 mg in sodium chloride 0.9 % 100 mL IVPB 100 mg (06/14/22 1315)   TPN ADULT (ION) 70 mL/hr at 06/14/22 1843    Procedures/Studies: US Abdomen Limited RUQ (LIVER/GB)  Result Date: 06/12/2022 CLINICAL DATA:  Hyperbilirubinemia EXAM: ULTRASOUND ABDOMEN LIMITED RIGHT UPPER QUADRANT COMPARISON:  CT abdomen and pelvis 06/11/2022 FINDINGS: Gallbladder: Sludge within gallbladder. No definite shadowing calculi. Mild gallbladder wall thickening. No sonographic Murphy sign. Common bile duct: Diameter: 3 mm, normal Liver: Normal echogenicity without mass or nodularity. No intrahepatic biliary dilatation. Portal vein is patent on  color Doppler imaging with normal direction of blood flow towards the liver. Other: Perihepatic ascites identified, containing septations/loculations. IMPRESSION: Sludge within gallbladder with mild gallbladder wall thickening, a nonspecific finding in the setting of ascites. No biliary dilatation or focal hepatic abnormality. Electronically Signed   By: Lavonia Dana M.D.   On: 06/12/2022 14:40   [place] EKG SITE RITE  Result Date: 06/11/2022 If Site Rite image not attached, placement could not be confirmed due to current cardiac rhythm.  CT ABDOMEN PELVIS WO CONTRAST  Result Date: 06/11/2022 CLINICAL DATA:  EXPLORATORY LAPAROTOMY, omental patchImpression: Worsening leukocytosis. EXAM:  CT ABDOMEN AND PELVIS WITHOUT CONTRAST TECHNIQUE: Multidetector CT imaging of the abdomen and pelvis was performed following the standard protocol without IV contrast. RADIATION DOSE REDUCTION: This exam was performed according to the departmental dose-optimization program which includes automated exposure control, adjustment of the mA and/or kV according to patient size and/or use of iterative reconstruction technique. COMPARISON:  06/03/2022. FINDINGS: Lower chest: Moderate pleural effusions. Dependent lower lobe and right middle lobe opacity, likely atelectasis. Additional ground-glass opacities are noted, most evident in the right middle lobe, suspicious for infection. Effusions are increased on the left and new on the right since the prior CT. Lung base opacities are mostly new. Hepatobiliary: No focal liver abnormality is seen. No gallstones, gallbladder wall thickening, or biliary dilatation. Pancreas: Unremarkable. No pancreatic ductal dilatation or surrounding inflammatory changes. Spleen: Normal in size without focal abnormality. Adrenals/Urinary Tract: No adrenal masses. No renal masses or hydronephrosis. Bladder is unremarkable. Stomach/Bowel: Stomach is mostly decompressed. There is an apparent defect along the anterior  aspect of the mid to distal stomach through which contrast and air has collected. The ill-defined collection of contrast and air lies in the mid to upper abdomen near the midline, with an adjacent surgical drain, and deep to the midline laparotomy incision. Poorly defined small bowel loops in the central abdomen. There is no colon or small bowel dilation to suggest obstruction. No colonic wall thickening or convincing inflammation. Vascular/Lymphatic: Aortic atherosclerosis. Prominent lymph nodes noted along the gastrohepatic ligament. There are no enlarged lymph nodes. Reproductive: Status post hysterectomy. No adnexal masses. Other: Small amount of ascites. Generalized increased attenuation of the mesenteric/peritoneal fat consistent with edema. Diffuse subcutaneous soft tissue edema. Musculoskeletal: No acute finding. IMPRESSION: 1. There is no defined fluid collection to indicate an abscess. 2. There is extraluminal air and contrast in the anterior central to upper abdomen, that appears to be arising from a defect along the anterior gastric wall. The extraluminal contrast and air lies adjacent to the surgical drain. 3. Small amount of ascites is similar to the previous CT. 4. Mesenteric/peritoneal and diffuse subcutaneous edema. 5. Moderate pleural effusions with associated lung base atelectasis. Additional areas of ground-glass lung opacity are suspicious for multifocal pneumonia. Electronically Signed   By: Lajean Manes M.D.   On: 06/11/2022 12:56   DG CHEST PORT 1 VIEW  Result Date: 06/09/2022 CLINICAL DATA:  Weakness, wheezing EXAM: PORTABLE CHEST 1 VIEW COMPARISON:  06/05/2022 FINDINGS: Interval endotracheal and esophagogastric extubation. Small to moderate bilateral pleural effusions, increased compared to prior examination, with associated atelectasis or consolidation and diffuse bilateral interstitial pulmonary opacity. Cardiomegaly. IMPRESSION: 1. Small to moderate bilateral pleural effusions,  increased compared to prior examination, with associated atelectasis or consolidation and diffuse bilateral interstitial pulmonary opacity. Findings are most consistent with worsened edema. 2. Interval endotracheal and esophagogastric extubation. 3. Cardiomegaly. Electronically Signed   By: Delanna Ahmadi M.D.   On: 06/09/2022 12:25   DG UGI W SMALL BOWEL  Result Date: 06/08/2022 CLINICAL DATA:  Post repair of 1.5 cm diameter perforated anterior wall gastric antral ulcer EXAM: WATER SOLUBLE UPPER GI SERIES TECHNIQUE: Single-column upper GI series was performed using water soluble contrast. Radiation Exposure Index (as provided by the fluoroscopic device): 43.1 mGy Kerma CONTRAST:  100 cc Omnipaque 300 via NG tube COMPARISON:  CT abdomen and pelvis 06/03/2022 FLUOROSCOPY: Fluoroscopy Time:  2 minutes 36 seconds Radiation Exposure Index (if provided by the fluoroscopic device): 43.1 Number of Acquired Spot Images: Multiple fluoroscopic screen captures and cine series FINDINGS:  Contrast administered via NG tube opacifies the gastric lumen. Patent pylorus with passage of contrast through normal appearing duodenal C loop into proximal jejunum with incidentally noted duodenal diverticulum at second portion. Irregularity of rugal folds with thickening and distortion at anterior wall of gastric antrum at site of perforation repair. The third portion of the duodenum is superimposed with the inferior wall of the gastric antrum, mildly limiting exam. A blush of contrast is seen inferior to the antrum, superimposed with the third portion of the duodenum throughout the study. This appears to move in concert with the duodenum with respiration. No contrast opacification of the JP drain. No gross evidence of contrast leak identified though it is difficult to completely exclude a small leak at the site of repair. IMPRESSION: Distortion and thickening of rugal folds at site of antral gastric ulcer repair. No gross evidence of leak  at the site of ulcer repair as discussed above. Electronically Signed   By: Lavonia Dana M.D.   On: 06/08/2022 15:32   ECHOCARDIOGRAM COMPLETE  Result Date: 06/07/2022    ECHOCARDIOGRAM REPORT   Patient Name:   MARCELINE NAPIERALA Date of Exam: 06/07/2022 Medical Rec #:  [id number]       Height:       61.0 in Accession #:    [id number]      Weight:       112.2 lb Date of Birth:  Jan 11, 1946        BSA:          1.477 m Patient Age:    55 years        BP:           116/46 mmHg Patient Gender: F               HR:           104 bpm. Exam Location:  [facility] Procedure: 2D Echo, Cardiac Doppler and Color Doppler Indications:    Bacteremia  History:        Patient has no prior history of Echocardiogram examinations.                 Signs/Symptoms:Bacteremia; Risk Factors:Hypertension and                 Dyslipidemia. Breast CA.  Sonographer:    Wenda Low Referring Phys: [id number] [place]  1. Abnormal septal motion . Left ventricular ejection fraction, by estimation, is 55 to 60%. The left ventricle has normal function. The left ventricle has no regional wall motion abnormalities. Left ventricular diastolic parameters were normal.  2. Right ventricular systolic function is normal. The right ventricular size is normal. There is mildly elevated pulmonary artery systolic pressure.  3. Moderate pleural effusion in the left lateral region.  4. The mitral valve is abnormal. Trivial mitral valve regurgitation. No evidence of mitral stenosis.  5. Thickening and calcification noted . The tricuspid valve is abnormal.  6. Some thickening in the LVOT near intervalvular fibrosa Given this and nodular calcification of PV suggest TEE if suspicion for SBE high. The aortic valve is tricuspid. There is moderate calcification of the aortic valve. There is moderate thickening of the aortic valve. Aortic valve regurgitation is mild. Aortic valve sclerosis/calcification is present, without any evidence of aortic stenosis.  7.  Nodular calcification seen on PV. The pulmonic valve was abnormal.  8. The inferior vena cava is normal in size with greater than 50% respiratory variability, suggesting right atrial pressure  of 3 mmHg. FINDINGS  Left Ventricle: Abnormal septal motion. Left ventricular ejection fraction, by estimation, is 55 to 60%. The left ventricle has normal function. The left ventricle has no regional wall motion abnormalities. The left ventricular internal cavity size was normal in size. There is no left ventricular hypertrophy. Left ventricular diastolic parameters were normal. Right Ventricle: The right ventricular size is normal. No increase in right ventricular wall thickness. Right ventricular systolic function is normal. There is mildly elevated pulmonary artery systolic pressure. The tricuspid regurgitant velocity is 3.21  m/s, and with an assumed right atrial pressure of 3 mmHg, the estimated right ventricular systolic pressure is 56.3 mmHg. Left Atrium: Left atrial size was normal in size. Right Atrium: Right atrial size was normal in size. Pericardium: There is no evidence of pericardial effusion. Mitral Valve: The mitral valve is abnormal. There is mild thickening of the mitral valve leaflet(s). There is mild calcification of the mitral valve leaflet(s). Mild mitral annular calcification. Trivial mitral valve regurgitation. No evidence of mitral valve stenosis. MV peak gradient, 3.9 mmHg. The mean mitral valve gradient is 1.0 mmHg. Tricuspid Valve: Thickening and calcification noted. The tricuspid valve is abnormal. Tricuspid valve regurgitation is trivial. No evidence of tricuspid stenosis. Aortic Valve: Some thickening in the LVOT near intervalvular fibrosa Given this and nodular calcification of PV suggest TEE if suspicion for SBE high. The aortic valve is tricuspid. There is moderate calcification of the aortic valve. There is moderate thickening of the aortic valve. Aortic valve regurgitation is mild. Aortic  valve sclerosis/calcification is present, without any evidence of aortic stenosis. Aortic valve mean gradient measures 5.0 mmHg. Aortic valve peak gradient measures 10.3 mmHg. Aortic valve area, by VTI measures 2.02 cm. Pulmonic Valve: Nodular calcification seen on PV. The pulmonic valve was abnormal. Pulmonic valve regurgitation is trivial. No evidence of pulmonic stenosis. Aorta: The aortic root is normal in size and structure. Venous: The inferior vena cava is normal in size with greater than 50% respiratory variability, suggesting right atrial pressure of 3 mmHg. IAS/Shunts: No atrial level shunt detected by color flow Doppler. Additional Comments: There is a moderate pleural effusion in the left lateral region.  LEFT VENTRICLE PLAX 2D LVIDd:         4.50 cm     Diastology LVIDs:         3.00 cm     LV e' medial:    7.29 cm/s LV PW:         0.80 cm     LV E/e' medial:  11.8 LV IVS:        0.80 cm     LV e' lateral:   9.79 cm/s LVOT diam:     1.80 cm     LV E/e' lateral: 8.8 LV SV:         55 LV SV Index:   37 LVOT Area:     2.54 cm  LV Volumes (MOD) LV vol d, MOD A2C: 43.7 ml LV vol d, MOD A4C: 52.7 ml LV vol s, MOD A2C: 17.3 ml LV vol s, MOD A4C: 23.8 ml LV SV MOD A2C:     26.4 ml LV SV MOD A4C:     52.7 ml LV SV MOD BP:      28.6 ml RIGHT VENTRICLE RV Basal diam:  3.10 cm RV Mid diam:    2.90 cm RV S prime:     14.50 cm/s TAPSE (M-mode): 3.0 cm LEFT ATRIUM  Index        RIGHT ATRIUM           Index LA diam:        3.60 cm 2.44 cm/m   RA Area:     11.50 cm LA Vol (A2C):   50.9 ml 34.45 ml/m  RA Volume:   21.80 ml  14.76 ml/m LA Vol (A4C):   43.7 ml 29.58 ml/m LA Biplane Vol: 49.8 ml 33.71 ml/m  AORTIC VALVE                     PULMONIC VALVE AV Area (Vmax):    1.92 cm      PV Vmax:       0.98 m/s AV Area (Vmean):   1.84 cm      PV Peak grad:  3.9 mmHg AV Area (VTI):     2.02 cm AV Vmax:           160.50 cm/s AV Vmean:          103.550 cm/s AV VTI:            0.274 m AV Peak Grad:      10.3  mmHg AV Mean Grad:      5.0 mmHg LVOT Vmax:         121.00 cm/s LVOT Vmean:        74.800 cm/s LVOT VTI:          0.218 m LVOT/AV VTI ratio: 0.79  AORTA Ao Root diam: 3.30 cm MITRAL VALVE               TRICUSPID VALVE MV Area (PHT): 6.02 cm    TR Peak grad:   41.2 mmHg MV Area VTI:   2.25 cm    TR Vmax:        321.00 cm/s MV Peak grad:  3.9 mmHg MV Mean grad:  1.0 mmHg    SHUNTS MV Vmax:       0.99 m/s    Systemic VTI:  0.22 m MV Vmean:      48.8 cm/s   Systemic Diam: 1.80 cm MV Decel Time: 126 msec MV E velocity: 86.00 cm/s MV A velocity: 72.70 cm/s MV E/A ratio:  1.18 Jenkins Rouge MD Electronically signed by Jenkins Rouge MD Signature Date/Time: 06/07/2022/10:28:36 AM    Final    DG CHEST PORT 1 VIEW  Result Date: 06/05/2022 CLINICAL DATA:  76 year old female with respiratory failure. Intubated. EXAM: PORTABLE CHEST 1 VIEW COMPARISON:  Portable chest 06/04/2022 and earlier. FINDINGS: Portable AP semi upright view at 0527 hours. Endotracheal tube tip in good position between the clavicles and carina. Stable left IJ central line and visible enteric tube. Increased left lung base and retrocardiac opacity now mostly obscuring the left hemidiaphragm. Slightly lower lung volumes overall. Mediastinal contours remain normal. No pneumothorax, pulmonary edema, or definite effusion. Negative right lung. Partially visible midline abdominal skin staples. Paucity of bowel gas in the upper abdomen. Stable left chest and axillary surgical clips. IMPRESSION: 1. Stable lines and tubes. 2. Increased left lower lobe collapse or consolidation since yesterday. Electronically Signed   By: Genevie Ann M.D.   On: 06/05/2022 08:26   DG Chest Port 1 View  Result Date: 06/04/2022 CLINICAL DATA:  Endotracheal tube, nasogastric tube and central line placement. EXAM: PORTABLE CHEST 1 VIEW COMPARISON:  June 03, 2022 FINDINGS: Since the prior study there has been interval placement of an endotracheal tube. Its distal tip is seen  approximately 3.8  cm from the carina. Interval nasogastric tube placement is also seen. Its distal and extends into the gastric lumen. There is a left internal jugular venous catheter with its distal tip overlying the distal superior vena cava. This is approximately 6 mm proximal to the junction of the superior vena cava and right atrium. The heart size and mediastinal contours are within normal limits. Mild to moderate severity atelectasis and/or infiltrate is seen within the left lung base. This represents a new finding when compared to the prior study. There is no evidence of a pleural effusion or pneumothorax. Radiopaque surgical clips are seen along the left axilla. The air seen below the right hemidiaphragm on the prior study is no longer visualized. The visualized skeletal structures are unremarkable. IMPRESSION: 1. Interval endotracheal tube, nasogastric tube and left internal jugular venous catheter placement positioning, as described above. 2. Mild to moderate severity left basilar atelectasis and/or infiltrate. Electronically Signed   By: Virgina Norfolk M.D.   On: 06/04/2022 02:47   CT Abdomen Pelvis Wo Contrast  Result Date: 06/03/2022 CLINICAL DATA:  Severe weakness for 2 days with acute abdominal pain, initial encounter EXAM: CT ABDOMEN AND PELVIS WITHOUT CONTRAST TECHNIQUE: Multidetector CT imaging of the abdomen and pelvis was performed following the standard protocol without IV contrast. RADIATION DOSE REDUCTION: This exam was performed according to the departmental dose-optimization program which includes automated exposure control, adjustment of the mA and/or kV according to patient size and/or use of iterative reconstruction technique. COMPARISON:  None Available. FINDINGS: Lower chest: Basilar atelectatic changes are noted. Hepatobiliary: Liver is within normal limits. Gallbladder demonstrates a normal appearance. Pancreas: Unremarkable. No pancreatic ductal dilatation or surrounding inflammatory changes.  Spleen: Normal in size without focal abnormality. Adrenals/Urinary Tract: Adrenal glands are within normal limits. Kidneys demonstrate tiny nonobstructing stones on right in the lower pole. No ureteral obstruction is seen. The bladder is decompressed. Stomach/Bowel: Considerable fecal material is noted within the rectal vault which may represent a focal impaction. Diverticular change of the colon is noted as well as some generalized wall thickening throughout the colon. The appendix is within normal limits. Small bowel is within normal limits. Stomach demonstrates a defect in the anterior gastric wall consistent with a perforated ulcer. There is a considerable amount of air and fluid throughout the abdomen with apparent direct communication with the gastric best seen on image number 36 of series 2 and image number 78 of series 6. Vascular/Lymphatic: Aortic atherosclerosis. No enlarged abdominal or pelvic lymph nodes. Reproductive: Status post hysterectomy. No adnexal masses. Other: Free air and free fluid similar to that described above throughout the abdomen. The bowel demonstrates some reactive wall thickening related to the fluid and air. Musculoskeletal: Degenerative changes of the lumbar spine are seen. No other bony abnormality is noted. IMPRESSION: Changes consistent with a perforated gastric ulcer anteriorly with considerable spillage of fluid and air into the abdominal cavity. Some reactive wall thickening is noted within the bowel loops secondary to these changes. Critical Value/emergent results were called by telephone at the time of interpretation on 06/03/2022 at 10:43 pm to Dr. Aletta Edouard , who verbally acknowledged these results. Electronically Signed   By: Inez Catalina M.D.   On: 06/03/2022 22:45   DG Chest Port 1 View  Result Date: 06/03/2022 CLINICAL DATA:  Weakness. EXAM: PORTABLE CHEST 1 VIEW COMPARISON:  None Available. FINDINGS: Multiple overlying radiopaque cardiac lead wires are seen.  The heart size and mediastinal contours are within normal limits. Mild,  diffuse, chronic appearing increased lung markings are seen. There is no evidence of an acute infiltrate, pleural effusion or pneumothorax. Radiopaque surgical clips are seen along the lateral aspect of the left chest wall. A crescentic area of air is seen just below the right hemidiaphragm. Degenerative changes seen throughout the thoracic spine. IMPRESSION: 1. Chronic appearing increased lung markings without evidence of acute or active cardiopulmonary disease. 2. Air seen just below the right hemidiaphragm which may represent an air filled loop of colon. Further evaluation with abdomen pelvis CT is recommended, as the presence of intra-abdominal free air cannot be excluded. Electronically Signed   By: Virgina Norfolk M.D.   On: 06/03/2022 21:22     Roxan Hockey, MD How to contact the [facility] Attending or Consulting provider [place] or covering provider during after hours [place], for this patient?  Check the care team in [facility] and look for a) attending/consulting [facility] provider listed and b) the [facility] team listed Log into [email or web address] and use [place]'s universal password to access. If you do not have the password, please contact the hospital operator. Locate the [facility] provider you are looking for under [facility] and page to a number that you can be directly reached. If you still have difficulty reaching the provider, please page the [facility] (Director on Call) for the Hospitalists listed on amion for assistance.   [facility]  If 7PM-7AM, please contact night-coverage [email or web address] Password [facility] 06/14/2022, 7:35 PM   LOS: 10 days

## 2022-06-14 NOTE — Progress Notes (Signed)
[facility] Progress Note  10 Days Post-Op  Subjective: Discussed with RN - patient received Zofran for nausea overnight but she did not vomit. Patient wakes and can follow commands; does not speak much due to some fatigue. She appears mildly uncomfortable and points to her head; she is not in acute distress. JP drain is dark brown fluid. Patient breathing with regular work of breathing on room air.  Objective: Vital signs in last 24 hours: Temp:  [98 F (36.7 C)-98.1 F (36.7 C)] 98.1 F (36.7 C) (07/11 2231) Pulse Rate:  [76-92] 92 (07/11 2231) Resp:  [19-20] 20 (07/11 2231) BP: (129-154)/(71-85) 154/85 (07/11 2231) SpO2:  [94 %-96 %] 94 % (07/11 2231) Last BM Date : 06/09/22  Intake/Output from previous day: 07/11 0701 - 07/12 0700 In: 1160.6 [I.V.:775.6; IV Piggyback:385] Out: 955 [Urine:500; Drains:455] Intake/Output this shift: No intake/output data recorded.  General appearance: No acute distress, alert, cooperative, mildly uncomfortable Resp: Regular work of breathing on room air; mild expiratory wheezes Cardio: Normal rate, regular rhythm, no murmurs Skin: Warm, dry Incision/Wound: JP drain dark brown fluid  Lab Results:  Recent Labs    06/13/22 0531 06/14/22 0608  WBC 28.3* 25.5*  HGB 11.4* 11.9*  HCT 35.4* 36.3  PLT 304 354   BMET Recent Labs    06/12/22 0514 06/13/22 0531  NA 142 141  K 3.0* 3.5  CL 107 107  CO2 31 29  GLUCOSE 129* 174*  BUN 40* 37*  CREATININE 1.44* 1.37*  CALCIUM 9.2 9.2   PT/INR No results for input(s): "LABPROT", "INR" in the last 72 hours.  Studies/Results: US Abdomen Limited RUQ (LIVER/GB)  Result Date: 06/12/2022 CLINICAL DATA:  Hyperbilirubinemia EXAM: ULTRASOUND ABDOMEN LIMITED RIGHT UPPER QUADRANT COMPARISON:  CT abdomen and pelvis 06/11/2022 FINDINGS: Gallbladder: Sludge within gallbladder. No definite shadowing calculi. Mild gallbladder wall thickening. No sonographic Murphy sign. Common bile  duct: Diameter: 3 mm, normal Liver: Normal echogenicity without mass or nodularity. No intrahepatic biliary dilatation. Portal vein is patent on color Doppler imaging with normal direction of blood flow towards the liver. Other: Perihepatic ascites identified, containing septations/loculations. IMPRESSION: Sludge within gallbladder with mild gallbladder wall thickening, a nonspecific finding in the setting of ascites. No biliary dilatation or focal hepatic abnormality. Electronically Signed   By: Lavonia Dana M.D.   On: 06/12/2022 14:40    Anti-infectives: Anti-infectives (From admission, onward)    Start     Dose/Rate Route Frequency Ordered Stop   06/13/22 1400  Ampicillin-Sulbactam (UNASYN) 3 g in sodium chloride 0.9 % 100 mL IVPB  Status:  Discontinued        3 g 200 mL/hr over 30 Minutes Intravenous Every 8 hours 06/13/22 0821 06/13/22 0823   06/13/22 1000  Ampicillin-Sulbactam (UNASYN) 3 g in sodium chloride 0.9 % 100 mL IVPB        3 g 200 mL/hr over 30 Minutes Intravenous Every 12 hours 06/13/22 0823     06/11/22 2200  piperacillin-tazobactam (ZOSYN) IVPB 3.375 g  Status:  Discontinued        3.375 g 12.5 mL/hr over 240 Minutes Intravenous Every 8 hours 06/11/22 1415 06/13/22 0821   06/11/22 1500  piperacillin-tazobactam (ZOSYN) IVPB 3.375 g        3.375 g 100 mL/hr over 30 Minutes Intravenous  Once 06/11/22 1410 06/12/22 0940   06/07/22 0200  fluconazole (DIFLUCAN) IVPB 200 mg  Status:  Discontinued        200 mg 100 mL/hr over 60 Minutes  Intravenous Every 24 hours 06/06/22 0901 06/06/22 1031   06/06/22 1200  micafungin (MYCAMINE) 100 mg in sodium chloride 0.9 % 100 mL IVPB        100 mg 105 mL/hr over 1 Hours Intravenous Every 24 hours 06/06/22 1031     06/05/22 1800  piperacillin-tazobactam (ZOSYN) IVPB 3.375 g        3.375 g 12.5 mL/hr over 240 Minutes Intravenous Every 12 hours 06/05/22 0825 06/09/22 2000   06/04/22 0600  piperacillin-tazobactam (ZOSYN) IVPB 3.375 g  Status:   Discontinued        3.375 g 12.5 mL/hr over 240 Minutes Intravenous Every 8 hours 06/04/22 0219 06/05/22 0825   06/04/22 0315  fluconazole (DIFLUCAN) IVPB 200 mg  Status:  Discontinued        200 mg 100 mL/hr over 60 Minutes Intravenous Every 24 hours 06/04/22 0217 06/04/22 0219   06/04/22 0315  fluconazole (DIFLUCAN) IVPB 200 mg  Status:  Discontinued        200 mg 100 mL/hr over 60 Minutes Intravenous Every 24 hours 06/04/22 0219 06/06/22 0901   06/03/22 2130  ceFEPIme (MAXIPIME) 2 g in sodium chloride 0.9 % 100 mL IVPB        2 g 200 mL/hr over 30 Minutes Intravenous  Once 06/03/22 2127 06/03/22 2209   06/03/22 2130  metroNIDAZOLE (FLAGYL) IVPB 500 mg  Status:  Discontinued        500 mg 100 mL/hr over 60 Minutes Intravenous Every 12 hours 06/03/22 2127 06/04/22 0217       Assessment/Plan: s/p Procedure(s): POD 10; EXPLORATORY LAPAROTOMY with omental patch   Patient is a 76 y.o. F who underwent an EXPLORATORY LAPAROTOMY with omental patch and presented with septic shock.  - PRN for pain - Patient on room air today, regular work of breathing - Patient's abdominal CT showed extraluminal air and contrast in the anterior central to upper abdomen and lies adjacent to surgical drain. Reimage Monday 7/17. Continue PPI BID - Patient has elevated total bilirubin; US abdomen (7/10) showed no biliary dilation with sludge within gallbladder and mild wall thickening in the setting of ascites. Continue to monitor for cholecystic symptoms - Patient received PICC line insertion 7/10 and TPN re-started 7/10 - NPO except for vital meds (Aricept, atorvastatin, etc.) - Patient continuing to receive antibiotics for contained leak. Leukocytosis trending down (25.5 WBC today 7/12) - Patient continuing to receive micafungin for previous fungal infection (repeat blood culture on 7/4 - no growth after 5 days)   LOS: 10 days   Najmah Carradine, Medical Student 06/14/2022

## 2022-06-15 ENCOUNTER — Encounter (HOSPITAL_COMMUNITY): Payer: Self-pay | Admitting: General Surgery

## 2022-06-15 DIAGNOSIS — J9601 Acute respiratory failure with hypoxia: Secondary | ICD-10-CM | POA: Diagnosis not present

## 2022-06-15 DIAGNOSIS — A419 Sepsis, unspecified organism: Secondary | ICD-10-CM | POA: Diagnosis not present

## 2022-06-15 DIAGNOSIS — Z7189 Other specified counseling: Secondary | ICD-10-CM

## 2022-06-15 DIAGNOSIS — R6521 Severe sepsis with septic shock: Secondary | ICD-10-CM | POA: Diagnosis not present

## 2022-06-15 DIAGNOSIS — Z515 Encounter for palliative care: Secondary | ICD-10-CM

## 2022-06-15 DIAGNOSIS — K255 Chronic or unspecified gastric ulcer with perforation: Secondary | ICD-10-CM | POA: Diagnosis not present

## 2022-06-15 LAB — CBC WITH DIFFERENTIAL/PLATELET
Abs Immature Granulocytes: 0.31 10*3/uL — ABNORMAL HIGH (ref 0.00–0.07)
Basophils Absolute: 0.1 10*3/uL (ref 0.0–0.1)
Basophils Relative: 0 %
Eosinophils Absolute: 0.3 10*3/uL (ref 0.0–0.5)
Eosinophils Relative: 1 %
HCT: 34.5 % — ABNORMAL LOW (ref 36.0–46.0)
Hemoglobin: 11.3 g/dL — ABNORMAL LOW (ref 12.0–15.0)
Immature Granulocytes: 1 %
Lymphocytes Relative: 7 %
Lymphs Abs: 1.9 10*3/uL (ref 0.7–4.0)
MCH: 30.4 pg (ref 26.0–34.0)
MCHC: 32.8 g/dL (ref 30.0–36.0)
MCV: 92.7 fL (ref 80.0–100.0)
Monocytes Absolute: 1.6 10*3/uL — ABNORMAL HIGH (ref 0.1–1.0)
Monocytes Relative: 6 %
Neutro Abs: 21.8 10*3/uL — ABNORMAL HIGH (ref 1.7–7.7)
Neutrophils Relative %: 85 %
Platelets: 475 10*3/uL — ABNORMAL HIGH (ref 150–400)
RBC: 3.72 MIL/uL — ABNORMAL LOW (ref 3.87–5.11)
RDW: 14.7 % (ref 11.5–15.5)
WBC: 25.9 10*3/uL — ABNORMAL HIGH (ref 4.0–10.5)
nRBC: 0 % (ref 0.0–0.2)

## 2022-06-15 LAB — COMPREHENSIVE METABOLIC PANEL
ALT: 25 U/L (ref 0–44)
AST: 35 U/L (ref 15–41)
Albumin: 1.6 g/dL — ABNORMAL LOW (ref 3.5–5.0)
Alkaline Phosphatase: 101 U/L (ref 38–126)
Anion gap: 4 — ABNORMAL LOW (ref 5–15)
BUN: 34 mg/dL — ABNORMAL HIGH (ref 8–23)
CO2: 29 mmol/L (ref 22–32)
Calcium: 10.2 mg/dL (ref 8.9–10.3)
Chloride: 109 mmol/L (ref 98–111)
Creatinine, Ser: 1.13 mg/dL — ABNORMAL HIGH (ref 0.44–1.00)
GFR, Estimated: 50 mL/min — ABNORMAL LOW (ref >60)
Glucose, Bld: 182 mg/dL — ABNORMAL HIGH (ref 70–99)
Potassium: 4.5 mmol/L (ref 3.5–5.1)
Sodium: 142 mmol/L (ref 135–145)
Total Bilirubin: 3.9 mg/dL — ABNORMAL HIGH (ref 0.3–1.2)
Total Protein: 5.3 g/dL — ABNORMAL LOW (ref 6.5–8.1)

## 2022-06-15 LAB — TRIGLYCERIDES: Triglycerides: 161 mg/dL — ABNORMAL HIGH (ref <150)

## 2022-06-15 LAB — PHOSPHORUS: Phosphorus: 2.7 mg/dL (ref 2.5–4.6)

## 2022-06-15 LAB — GLUCOSE, CAPILLARY
Glucose-Capillary: 149 mg/dL — ABNORMAL HIGH (ref 70–99)
Glucose-Capillary: 156 mg/dL — ABNORMAL HIGH (ref 70–99)
Glucose-Capillary: 161 mg/dL — ABNORMAL HIGH (ref 70–99)
Glucose-Capillary: 164 mg/dL — ABNORMAL HIGH (ref 70–99)

## 2022-06-15 LAB — MAGNESIUM: Magnesium: 1.8 mg/dL (ref 1.7–2.4)

## 2022-06-15 MED ORDER — TRAVASOL 10 % IV SOLN
INTRAVENOUS | Status: AC
  Filled 2022-06-15: qty 772.8

## 2022-06-15 MED ORDER — TRAVASOL 10 % IV SOLN: INTRAVENOUS | Status: DC

## 2022-06-15 NOTE — Progress Notes (Signed)
PROGRESS NOTE  Carolyn Erickson XHB:[id number] DOB: [birth year] DOA: 06/03/2022 PCP: Dettinger, Fransisca Kaufmann, MD  Brief History:  76 year old female with history of major neurocognitive disorder, hypertension, hyperlipidemia, depression, left breast cancer presenting with generalized weakness and abdominal pain.  Apparently, the patient has been having abdominal pain since April 2023.  The patient had been started on pantoprazole at that time.  There was no improvement.  She followed up with her PCP on 06/02/2022.  Referral was given to see gastroenterology.  Abdominal pain had worsened over the past 2 days prior to admission with worsening generalized weakness.  There is no fevers, chills, chest pain, shortness breath, vomiting, diarrhea.  There is no hematochezia or melena noted.  In the emergency department, the patient was hypotensive with a blood pressure of 71/54.  She was somnolent.  CT of the abdomen and pelvis was performed and showed changes consistent with a perforated gastric ulcer.  Dr. Constance Haw was consulted, and the patient was taken to the OR for exploratory laparotomy, gastrorrhaphy, omental patch.  A left IJ central line was also placed.  TRH is consulted for medical management  In the ED, the patient had low-grade fever 99.5 F.  She was tachycardic and hypotensive with blood pressure 68/53.  WBC 4.1, hemoglobin 14.6, platelets 308,000.  BMP showed sodium 137, potassium 4.1, bicarbonate 22, serum creatinine 2.14.  Postoperatively, the patient was started on Zosyn and fluconazole.  She remained on the ventilator.  Preoperative chest x-ray showed bibasilar atelectasis.  There was free air under the diaphragm.   Assessment and Plan: * Septic shock - RESOLVED Presented with hypotension, tachycardia, and lactic acid peaked at 6.8 Pt successfully weaned off norepinephrine infusion Secondary to gastric perforation with peritonitis 06/03/22 blood cultures--C. glabrata UA 11-20 WBC>>urine  culture unrevealing Continue IV Unasyn per ID  discontinued fluconazole per ID,  -continue echinocandin (micafungin)  Gastric perforation ([place]) Suspect this may be due to chronic NSAID use -Continue PPI twice daily -06/04/2022--ex laparotomy with omental patch -Postoperative care per Dr. Constance Haw -TNA therapy on hold temporarily due to need for CVL holiday -UGI 7/6 - no leak seen.  7/9 - CT c/w leak present - PICC line placed 7/10 and TPN started 7/10 - surgery planning to repeat CT on Monday 06/19/22  Fungemia 06/03/22 blood cultures--Candida glabrata -d/c fluconazole per ID consult -continue echinocandin per ID  -discussed with general surgery>>removed central line 7/4 for line holiday -repeat blood culture on 7/4 with no growth to date   Acute renal failure superimposed on stage 3a chronic kidney disease (HCC) -Serum creatinine peaked at 2.28 but trending down  -Secondary to hemodynamic changes/sepsis and hypovolemia -Creatinine is down to 1.1 -renally adjust medications, avoid nephrotoxic agents / dehydration  / hypotension  Leukocytosis -- WBC peaked at 31 K -Leukocytosis persist -- appreciate ID and surgery recommendations,  -continue antifungal and Unasyn  Hyperbilirubinemia -- secondary to persistent leak.  Continuing to monitor.   Volume overload -- treated with IV lasix 40 mg x 2 on 7/7 and resolved  Hypomagnesemia IV replacement ordered and repleted, continue monitoring   Acute respiratory failure with hypoxia (HCC) Currently PRVC, RR 20, Vt 380, FiO2 0.4 -7/3 personally reviewed CXR--increased interstitial marking -7/3 ABG--7.35/36/107/20 (0.4) -7/3--extubated -7/4-5--stable on 4L [place] -7/6 - down to 2L/min   Major neurocognitive disorder [facility]) Patient had MoCa 17/30 on 03/14/22 -resumed donezepil, venlafaxine, bupropion 06/12/22  GERD (gastroesophageal reflux disease) Continue pantoprazole IV twice daily  Essential hypertension  amlodipine 2.5 mg  daily  Mixed hyperlipidemia -- resumed home atorvastatin   Family Communication:   Discussed with husband at bedside  consultants:  general surgery  Code Status:  FULL   DVT Prophylaxis:  SCDs  Procedures: As Listed in Progress Note Above  Antibiotics: Fluconazole 7/2>>7/4 Micafungin 7/4>> Zosyn 7/2>>7/11 Unasyn 7/11>>  Subjective: --Resting comfortably after receiving medications -Largely n.p.o. but able to take medications with spoonful of sauce  Objective: Vitals:   06/13/22 2231 06/14/22 1447 06/14/22 2100 06/15/22 1348  BP: (!) 154/85 134/72 139/72 (!) 145/91  Pulse: 92 81 78 79  Resp: '20 18 20 18  '$ Temp: 98.1 F (36.7 C) 98.5 F (36.9 C) 98.3 F (36.8 C) 98 F (36.7 C)  TempSrc: Oral Oral Oral Oral  SpO2: 94% 100% 94% 99%  Weight:      Height:        Intake/Output Summary (Last 24 hours) at 06/15/2022 1813 Last data filed at 06/15/2022 1700 Gross per 24 hour  Intake 1202.62 ml  Output 1480 ml  Net -277.38 ml   Physical Exam  Gen:- Awake Alert, in no acute distress  HEENT:- [place].AT, No sclera icterus Neck-Supple Neck,No JVD,.  Lungs-  CTAB , fair air movement bilaterally  CV- S1, S2 normal, RRR Abd-  +ve B.Sounds, Abd Soft, mild abdominal discomfort with palpation, JP drain with dark brownish fluid Extremity/Skin:- good pedal pulses  Psych-affect is appropriate, oriented x3 Neuro-generalized weakness, no new focal deficits, no tremors   Data Reviewed: I have personally reviewed following labs and imaging studies Basic Metabolic Panel: Recent Labs  Lab 06/09/22 0503 06/10/22 0549 06/11/22 0707 06/12/22 0514 06/13/22 0531 06/15/22 0606  NA 144 146* 145 142 141 142  K 4.1 3.9 3.3* 3.0* 3.5 4.5  CL 112* 109 107 107 107 109  CO2 '26 29 30 31 29 29  '$ GLUCOSE 140* 112* 118* 129* 174* 182*  BUN 54* 54* 51* 40* 37* 34*  CREATININE 1.88* 1.97* 1.66* 1.44* 1.37* 1.13*  CALCIUM 8.9 9.2 9.6 9.2 9.2 10.2  MG 2.3 2.0  --  1.6* 2.3 1.8  PHOS  --   --    --  3.5 3.5 2.7   Liver Function Tests: Recent Labs  Lab 06/10/22 0549 06/11/22 0707 06/12/22 0514 06/13/22 0531 06/15/22 0606  AST '22 30 27 27 '$ 35  ALT '14 18 18 18 25  '$ ALKPHOS 91 122 99 92 101  BILITOT 3.8* 4.8* 4.6* 4.6* 3.9*  PROT 4.7* 5.6* 4.7* 4.7* 5.3*  ALBUMIN <1.5* 1.8* <1.5* <1.5* 1.6*   No results for input(s): "LIPASE", "AMYLASE" in the last 168 hours.  No results for input(s): "AMMONIA" in the last 168 hours. Coagulation Profile: No results for input(s): "INR", "PROTIME" in the last 168 hours.  CBC: Recent Labs  Lab 06/11/22 0707 06/12/22 0514 06/13/22 0531 06/14/22 0608 06/15/22 0606  WBC 31.0* 30.3* 28.3* 25.5* 25.9*  NEUTROABS 25.8* 26.2* 24.7* 22.4* 21.8*  HGB 13.4 11.4* 11.4* 11.9* 11.3*  HCT 41.4 35.9* 35.4* 36.3 34.5*  MCV 91.8 92.3 92.4 92.4 92.7  PLT 228 247 304 354 475*   Cardiac Enzymes: No results for input(s): "CKTOTAL", "CKMB", "CKMBINDEX", "TROPONINI" in the last 168 hours. BNP: Invalid input(s): "POCBNP" CBG: Recent Labs  Lab 06/14/22 1801 06/15/22 0014 06/15/22 0608 06/15/22 0751 06/15/22 1743  GLUCAP 189* 156* 164* 161* 149*   HbA1C: No results for input(s): "HGBA1C" in the last 72 hours. Urine analysis:    Component Value Date/Time  COLORURINE AMBER (A) 06/04/2022 0520   APPEARANCEUR CLOUDY (A) 06/04/2022 0520   APPEARANCEUR Cloudy (A) 04/13/2021 1530   LABSPEC 1.027 06/04/2022 0520   PHURINE 5.0 06/04/2022 0520   GLUCOSEU NEGATIVE 06/04/2022 0520   HGBUR NEGATIVE 06/04/2022 0520   BILIRUBINUR NEGATIVE 06/04/2022 0520   BILIRUBINUR Negative 04/13/2021 [place] 06/04/2022 0520   PROTEINUR 100 (A) 06/04/2022 0520   NITRITE NEGATIVE 06/04/2022 0520   LEUKOCYTESUR SMALL (A) 06/04/2022 0520    Recent Results (from the past 240 hour(s))  Culture, blood (Routine X 2) w Reflex to ID Panel     Status: None   Collection Time: 06/06/22  9:32 AM   Specimen: BLOOD LEFT WRIST  Result Value Ref Range Status    Specimen Description BLOOD LEFT WRIST  Final   Special Requests   Final    BOTTLES DRAWN AEROBIC ONLY Blood Culture results may not be optimal due to an inadequate volume of blood received in culture bottles   Culture   Final    NO GROWTH 5 DAYS Performed at [facility], [address], [place], [place] [address]    Report Status 06/11/2022 FINAL  Final  Culture, blood (Routine X 2) w Reflex to ID Panel     Status: None   Collection Time: 06/06/22  9:33 AM   Specimen: Left Antecubital; Blood  Result Value Ref Range Status   Specimen Description LEFT ANTECUBITAL  Final   Special Requests   Final    BOTTLES DRAWN AEROBIC AND ANAEROBIC Blood Culture adequate volume   Culture   Final    NO GROWTH 5 DAYS Performed at [facility], [address] [place],  [address]    Report Status 06/11/2022 FINAL  Final     Scheduled Meds:  amLODipine  2.5 mg Oral Daily   atorvastatin  20 mg Oral QPM   buPROPion  150 mg Oral BID   Chlorhexidine Gluconate Cloth  6 each Topical Daily   donepezil  10 mg Oral QHS   heparin  5,000 Units Subcutaneous Q8H   insulin aspart  0-9 Units Subcutaneous Q6H   mouth rinse  15 mL Mouth Rinse Q4H   pantoprazole (PROTONIX) IV  40 mg Intravenous Q12H   sodium chloride flush  10-40 mL Intracatheter Q12H   venlafaxine  37.5 mg Oral BID   Continuous Infusions:  ampicillin-sulbactam (UNASYN) IV 3 g (06/15/22 1503)   micafungin (MYCAMINE) 100 mg in sodium chloride 0.9 % 100 mL IVPB 100 mg (06/15/22 1700)   TPN ADULT (ION) 70 mL/hr at 06/15/22 1724    Procedures/Studies: US Abdomen Limited RUQ (LIVER/GB)  Result Date: 06/12/2022 CLINICAL DATA:  Hyperbilirubinemia EXAM: ULTRASOUND ABDOMEN LIMITED RIGHT UPPER QUADRANT COMPARISON:  CT abdomen and pelvis 06/11/2022 FINDINGS: Gallbladder: Sludge within gallbladder. No definite shadowing calculi. Mild gallbladder wall thickening. No sonographic Murphy sign. Common bile duct: Diameter: 3 mm, normal Liver: Normal  echogenicity without mass or nodularity. No intrahepatic biliary dilatation. Portal vein is patent on color Doppler imaging with normal direction of blood flow towards the liver. Other: Perihepatic ascites identified, containing septations/loculations. IMPRESSION: Sludge within gallbladder with mild gallbladder wall thickening, a nonspecific finding in the setting of ascites. No biliary dilatation or focal hepatic abnormality. Electronically Signed   By: Lavonia Dana M.D.   On: 06/12/2022 14:40   [place] EKG SITE RITE  Result Date: 06/11/2022 If Site Rite image not attached, placement could not be confirmed due to current cardiac rhythm.  CT ABDOMEN PELVIS  WO CONTRAST  Result Date: 06/11/2022 CLINICAL DATA:  EXPLORATORY LAPAROTOMY, omental patchImpression: Worsening leukocytosis. EXAM: CT ABDOMEN AND PELVIS WITHOUT CONTRAST TECHNIQUE: Multidetector CT imaging of the abdomen and pelvis was performed following the standard protocol without IV contrast. RADIATION DOSE REDUCTION: This exam was performed according to the departmental dose-optimization program which includes automated exposure control, adjustment of the mA and/or kV according to patient size and/or use of iterative reconstruction technique. COMPARISON:  06/03/2022. FINDINGS: Lower chest: Moderate pleural effusions. Dependent lower lobe and right middle lobe opacity, likely atelectasis. Additional ground-glass opacities are noted, most evident in the right middle lobe, suspicious for infection. Effusions are increased on the left and new on the right since the prior CT. Lung base opacities are mostly new. Hepatobiliary: No focal liver abnormality is seen. No gallstones, gallbladder wall thickening, or biliary dilatation. Pancreas: Unremarkable. No pancreatic ductal dilatation or surrounding inflammatory changes. Spleen: Normal in size without focal abnormality. Adrenals/Urinary Tract: No adrenal masses. No renal masses or hydronephrosis. Bladder is  unremarkable. Stomach/Bowel: Stomach is mostly decompressed. There is an apparent defect along the anterior aspect of the mid to distal stomach through which contrast and air has collected. The ill-defined collection of contrast and air lies in the mid to upper abdomen near the midline, with an adjacent surgical drain, and deep to the midline laparotomy incision. Poorly defined small bowel loops in the central abdomen. There is no colon or small bowel dilation to suggest obstruction. No colonic wall thickening or convincing inflammation. Vascular/Lymphatic: Aortic atherosclerosis. Prominent lymph nodes noted along the gastrohepatic ligament. There are no enlarged lymph nodes. Reproductive: Status post hysterectomy. No adnexal masses. Other: Small amount of ascites. Generalized increased attenuation of the mesenteric/peritoneal fat consistent with edema. Diffuse subcutaneous soft tissue edema. Musculoskeletal: No acute finding. IMPRESSION: 1. There is no defined fluid collection to indicate an abscess. 2. There is extraluminal air and contrast in the anterior central to upper abdomen, that appears to be arising from a defect along the anterior gastric wall. The extraluminal contrast and air lies adjacent to the surgical drain. 3. Small amount of ascites is similar to the previous CT. 4. Mesenteric/peritoneal and diffuse subcutaneous edema. 5. Moderate pleural effusions with associated lung base atelectasis. Additional areas of ground-glass lung opacity are suspicious for multifocal pneumonia. Electronically Signed   By: Lajean Manes M.D.   On: 06/11/2022 12:56   DG CHEST PORT 1 VIEW  Result Date: 06/09/2022 CLINICAL DATA:  Weakness, wheezing EXAM: PORTABLE CHEST 1 VIEW COMPARISON:  06/05/2022 FINDINGS: Interval endotracheal and esophagogastric extubation. Small to moderate bilateral pleural effusions, increased compared to prior examination, with associated atelectasis or consolidation and diffuse bilateral  interstitial pulmonary opacity. Cardiomegaly. IMPRESSION: 1. Small to moderate bilateral pleural effusions, increased compared to prior examination, with associated atelectasis or consolidation and diffuse bilateral interstitial pulmonary opacity. Findings are most consistent with worsened edema. 2. Interval endotracheal and esophagogastric extubation. 3. Cardiomegaly. Electronically Signed   By: Delanna Ahmadi M.D.   On: 06/09/2022 12:25   DG UGI W SMALL BOWEL  Result Date: 06/08/2022 CLINICAL DATA:  Post repair of 1.5 cm diameter perforated anterior wall gastric antral ulcer EXAM: WATER SOLUBLE UPPER GI SERIES TECHNIQUE: Single-column upper GI series was performed using water soluble contrast. Radiation Exposure Index (as provided by the fluoroscopic device): 43.1 mGy Kerma CONTRAST:  100 cc Omnipaque 300 via NG tube COMPARISON:  CT abdomen and pelvis 06/03/2022 FLUOROSCOPY: Fluoroscopy Time:  2 minutes 36 seconds Radiation Exposure Index (if provided by the  fluoroscopic device): 43.1 Number of Acquired Spot Images: Multiple fluoroscopic screen captures and cine series FINDINGS: Contrast administered via NG tube opacifies the gastric lumen. Patent pylorus with passage of contrast through normal appearing duodenal C loop into proximal jejunum with incidentally noted duodenal diverticulum at second portion. Irregularity of rugal folds with thickening and distortion at anterior wall of gastric antrum at site of perforation repair. The third portion of the duodenum is superimposed with the inferior wall of the gastric antrum, mildly limiting exam. A blush of contrast is seen inferior to the antrum, superimposed with the third portion of the duodenum throughout the study. This appears to move in concert with the duodenum with respiration. No contrast opacification of the JP drain. No gross evidence of contrast leak identified though it is difficult to completely exclude a small leak at the site of repair. IMPRESSION:  Distortion and thickening of rugal folds at site of antral gastric ulcer repair. No gross evidence of leak at the site of ulcer repair as discussed above. Electronically Signed   By: Lavonia Dana M.D.   On: 06/08/2022 15:32   ECHOCARDIOGRAM COMPLETE  Result Date: 06/07/2022    ECHOCARDIOGRAM REPORT   Patient Name:   YESICA KEMLER Date of Exam: 06/07/2022 Medical Rec #:  [id number]       Height:       61.0 in Accession #:    [id number]      Weight:       112.2 lb Date of Birth:  [birth year]        BSA:          1.477 m Patient Age:    26 years        BP:           116/46 mmHg Patient Gender: F               HR:           104 bpm. Exam Location:  [facility] Procedure: 2D Echo, Cardiac Doppler and Color Doppler Indications:    Bacteremia  History:        Patient has no prior history of Echocardiogram examinations.                 Signs/Symptoms:Bacteremia; Risk Factors:Hypertension and                 Dyslipidemia. Breast CA.  Sonographer:    Wenda Low Referring Phys: [id number] [place]  1. Abnormal septal motion . Left ventricular ejection fraction, by estimation, is 55 to 60%. The left ventricle has normal function. The left ventricle has no regional wall motion abnormalities. Left ventricular diastolic parameters were normal.  2. Right ventricular systolic function is normal. The right ventricular size is normal. There is mildly elevated pulmonary artery systolic pressure.  3. Moderate pleural effusion in the left lateral region.  4. The mitral valve is abnormal. Trivial mitral valve regurgitation. No evidence of mitral stenosis.  5. Thickening and calcification noted . The tricuspid valve is abnormal.  6. Some thickening in the LVOT near intervalvular fibrosa Given this and nodular calcification of PV suggest TEE if suspicion for SBE high. The aortic valve is tricuspid. There is moderate calcification of the aortic valve. There is moderate thickening of the aortic valve. Aortic valve regurgitation  is mild. Aortic valve sclerosis/calcification is present, without any evidence of aortic stenosis.  7. Nodular calcification seen on PV. The pulmonic valve was abnormal.  8. The inferior  vena cava is normal in size with greater than 50% respiratory variability, suggesting right atrial pressure of 3 mmHg. FINDINGS  Left Ventricle: Abnormal septal motion. Left ventricular ejection fraction, by estimation, is 55 to 60%. The left ventricle has normal function. The left ventricle has no regional wall motion abnormalities. The left ventricular internal cavity size was normal in size. There is no left ventricular hypertrophy. Left ventricular diastolic parameters were normal. Right Ventricle: The right ventricular size is normal. No increase in right ventricular wall thickness. Right ventricular systolic function is normal. There is mildly elevated pulmonary artery systolic pressure. The tricuspid regurgitant velocity is 3.21  m/s, and with an assumed right atrial pressure of 3 mmHg, the estimated right ventricular systolic pressure is 56.3 mmHg. Left Atrium: Left atrial size was normal in size. Right Atrium: Right atrial size was normal in size. Pericardium: There is no evidence of pericardial effusion. Mitral Valve: The mitral valve is abnormal. There is mild thickening of the mitral valve leaflet(s). There is mild calcification of the mitral valve leaflet(s). Mild mitral annular calcification. Trivial mitral valve regurgitation. No evidence of mitral valve stenosis. MV peak gradient, 3.9 mmHg. The mean mitral valve gradient is 1.0 mmHg. Tricuspid Valve: Thickening and calcification noted. The tricuspid valve is abnormal. Tricuspid valve regurgitation is trivial. No evidence of tricuspid stenosis. Aortic Valve: Some thickening in the LVOT near intervalvular fibrosa Given this and nodular calcification of PV suggest TEE if suspicion for SBE high. The aortic valve is tricuspid. There is moderate calcification of the aortic  valve. There is moderate thickening of the aortic valve. Aortic valve regurgitation is mild. Aortic valve sclerosis/calcification is present, without any evidence of aortic stenosis. Aortic valve mean gradient measures 5.0 mmHg. Aortic valve peak gradient measures 10.3 mmHg. Aortic valve area, by VTI measures 2.02 cm. Pulmonic Valve: Nodular calcification seen on PV. The pulmonic valve was abnormal. Pulmonic valve regurgitation is trivial. No evidence of pulmonic stenosis. Aorta: The aortic root is normal in size and structure. Venous: The inferior vena cava is normal in size with greater than 50% respiratory variability, suggesting right atrial pressure of 3 mmHg. IAS/Shunts: No atrial level shunt detected by color flow Doppler. Additional Comments: There is a moderate pleural effusion in the left lateral region.  LEFT VENTRICLE PLAX 2D LVIDd:         4.50 cm     Diastology LVIDs:         3.00 cm     LV e' medial:    7.29 cm/s LV PW:         0.80 cm     LV E/e' medial:  11.8 LV IVS:        0.80 cm     LV e' lateral:   9.79 cm/s LVOT diam:     1.80 cm     LV E/e' lateral: 8.8 LV SV:         55 LV SV Index:   37 LVOT Area:     2.54 cm  LV Volumes (MOD) LV vol d, MOD A2C: 43.7 ml LV vol d, MOD A4C: 52.7 ml LV vol s, MOD A2C: 17.3 ml LV vol s, MOD A4C: 23.8 ml LV SV MOD A2C:     26.4 ml LV SV MOD A4C:     52.7 ml LV SV MOD BP:      28.6 ml RIGHT VENTRICLE RV Basal diam:  3.10 cm RV Mid diam:    2.90 cm RV S prime:  14.50 cm/s TAPSE (M-mode): 3.0 cm LEFT ATRIUM             Index        RIGHT ATRIUM           Index LA diam:        3.60 cm 2.44 cm/m   RA Area:     11.50 cm LA Vol (A2C):   50.9 ml 34.45 ml/m  RA Volume:   21.80 ml  14.76 ml/m LA Vol (A4C):   43.7 ml 29.58 ml/m LA Biplane Vol: 49.8 ml 33.71 ml/m  AORTIC VALVE                     PULMONIC VALVE AV Area (Vmax):    1.92 cm      PV Vmax:       0.98 m/s AV Area (Vmean):   1.84 cm      PV Peak grad:  3.9 mmHg AV Area (VTI):     2.02 cm AV Vmax:            160.50 cm/s AV Vmean:          103.550 cm/s AV VTI:            0.274 m AV Peak Grad:      10.3 mmHg AV Mean Grad:      5.0 mmHg LVOT Vmax:         121.00 cm/s LVOT Vmean:        74.800 cm/s LVOT VTI:          0.218 m LVOT/AV VTI ratio: 0.79  AORTA Ao Root diam: 3.30 cm MITRAL VALVE               TRICUSPID VALVE MV Area (PHT): 6.02 cm    TR Peak grad:   41.2 mmHg MV Area VTI:   2.25 cm    TR Vmax:        321.00 cm/s MV Peak grad:  3.9 mmHg MV Mean grad:  1.0 mmHg    SHUNTS MV Vmax:       0.99 m/s    Systemic VTI:  0.22 m MV Vmean:      48.8 cm/s   Systemic Diam: 1.80 cm MV Decel Time: 126 msec MV E velocity: 86.00 cm/s MV A velocity: 72.70 cm/s MV E/A ratio:  1.18 Jenkins Rouge MD Electronically signed by Jenkins Rouge MD Signature Date/Time: 06/07/2022/10:28:36 AM    Final    DG CHEST PORT 1 VIEW  Result Date: 06/05/2022 CLINICAL DATA:  76 year old female with respiratory failure. Intubated. EXAM: PORTABLE CHEST 1 VIEW COMPARISON:  Portable chest 06/04/2022 and earlier. FINDINGS: Portable AP semi upright view at 0527 hours. Endotracheal tube tip in good position between the clavicles and carina. Stable left IJ central line and visible enteric tube. Increased left lung base and retrocardiac opacity now mostly obscuring the left hemidiaphragm. Slightly lower lung volumes overall. Mediastinal contours remain normal. No pneumothorax, pulmonary edema, or definite effusion. Negative right lung. Partially visible midline abdominal skin staples. Paucity of bowel gas in the upper abdomen. Stable left chest and axillary surgical clips. IMPRESSION: 1. Stable lines and tubes. 2. Increased left lower lobe collapse or consolidation since yesterday. Electronically Signed   By: Genevie Ann M.D.   On: 06/05/2022 08:26   DG Chest Port 1 View  Result Date: 06/04/2022 CLINICAL DATA:  Endotracheal tube, nasogastric tube and central line placement. EXAM: PORTABLE CHEST 1 VIEW COMPARISON:  June 03, 2022 FINDINGS: Since the prior study  there has been interval placement of an endotracheal tube. Its distal tip is seen approximately 3.8 cm from the carina. Interval nasogastric tube placement is also seen. Its distal and extends into the gastric lumen. There is a left internal jugular venous catheter with its distal tip overlying the distal superior vena cava. This is approximately 6 mm proximal to the junction of the superior vena cava and right atrium. The heart size and mediastinal contours are within normal limits. Mild to moderate severity atelectasis and/or infiltrate is seen within the left lung base. This represents a new finding when compared to the prior study. There is no evidence of a pleural effusion or pneumothorax. Radiopaque surgical clips are seen along the left axilla. The air seen below the right hemidiaphragm on the prior study is no longer visualized. The visualized skeletal structures are unremarkable. IMPRESSION: 1. Interval endotracheal tube, nasogastric tube and left internal jugular venous catheter placement positioning, as described above. 2. Mild to moderate severity left basilar atelectasis and/or infiltrate. Electronically Signed   By: Virgina Norfolk M.D.   On: 06/04/2022 02:47   CT Abdomen Pelvis Wo Contrast  Result Date: 06/03/2022 CLINICAL DATA:  Severe weakness for 2 days with acute abdominal pain, initial encounter EXAM: CT ABDOMEN AND PELVIS WITHOUT CONTRAST TECHNIQUE: Multidetector CT imaging of the abdomen and pelvis was performed following the standard protocol without IV contrast. RADIATION DOSE REDUCTION: This exam was performed according to the departmental dose-optimization program which includes automated exposure control, adjustment of the mA and/or kV according to patient size and/or use of iterative reconstruction technique. COMPARISON:  None Available. FINDINGS: Lower chest: Basilar atelectatic changes are noted. Hepatobiliary: Liver is within normal limits. Gallbladder demonstrates a normal  appearance. Pancreas: Unremarkable. No pancreatic ductal dilatation or surrounding inflammatory changes. Spleen: Normal in size without focal abnormality. Adrenals/Urinary Tract: Adrenal glands are within normal limits. Kidneys demonstrate tiny nonobstructing stones on right in the lower pole. No ureteral obstruction is seen. The bladder is decompressed. Stomach/Bowel: Considerable fecal material is noted within the rectal vault which may represent a focal impaction. Diverticular change of the colon is noted as well as some generalized wall thickening throughout the colon. The appendix is within normal limits. Small bowel is within normal limits. Stomach demonstrates a defect in the anterior gastric wall consistent with a perforated ulcer. There is a considerable amount of air and fluid throughout the abdomen with apparent direct communication with the gastric best seen on image number 36 of series 2 and image number 78 of series 6. Vascular/Lymphatic: Aortic atherosclerosis. No enlarged abdominal or pelvic lymph nodes. Reproductive: Status post hysterectomy. No adnexal masses. Other: Free air and free fluid similar to that described above throughout the abdomen. The bowel demonstrates some reactive wall thickening related to the fluid and air. Musculoskeletal: Degenerative changes of the lumbar spine are seen. No other bony abnormality is noted. IMPRESSION: Changes consistent with a perforated gastric ulcer anteriorly with considerable spillage of fluid and air into the abdominal cavity. Some reactive wall thickening is noted within the bowel loops secondary to these changes. Critical Value/emergent results were called by telephone at the time of interpretation on 06/03/2022 at 10:43 pm to Dr. Aletta Edouard , who verbally acknowledged these results. Electronically Signed   By: Inez Catalina M.D.   On: 06/03/2022 22:45   DG Chest Port 1 View  Result Date: 06/03/2022 CLINICAL DATA:  Weakness. EXAM: PORTABLE CHEST 1  VIEW COMPARISON:  None Available.  FINDINGS: Multiple overlying radiopaque cardiac lead wires are seen. The heart size and mediastinal contours are within normal limits. Mild, diffuse, chronic appearing increased lung markings are seen. There is no evidence of an acute infiltrate, pleural effusion or pneumothorax. Radiopaque surgical clips are seen along the lateral aspect of the left chest wall. A crescentic area of air is seen just below the right hemidiaphragm. Degenerative changes seen throughout the thoracic spine. IMPRESSION: 1. Chronic appearing increased lung markings without evidence of acute or active cardiopulmonary disease. 2. Air seen just below the right hemidiaphragm which may represent an air filled loop of colon. Further evaluation with abdomen pelvis CT is recommended, as the presence of intra-abdominal free air cannot be excluded. Electronically Signed   By: Virgina Norfolk M.D.   On: 06/03/2022 21:22     Roxan Hockey, MD How to contact the [facility] Attending or Consulting provider [place] or covering provider during after hours [place], for this patient?  Check the care team in [facility] and look for a) attending/consulting [facility] provider listed and b) the [facility] team listed Log into [email or web address] and use [place] universal password to access. If you do not have the password, please contact the hospital operator. Locate the [facility] provider you are looking for under [facility] and page to a number that you can be directly reached. If you still have difficulty reaching the provider, please page the [facility] (Director on Call) for the Hospitalists listed on amion for assistance.   [facility]  If 7PM-7AM, please contact night-coverage [email or web address] Password [facility] 06/15/2022, 6:13 PM   LOS: 11 days

## 2022-06-15 NOTE — Therapy (Signed)
Attempted to see patient twice; initially nursing was working with her and so I came back about 4:35 pm; family present in room but patient was sleeping hard and I was unable to rouse her; breathing deeply.  Family states she took some nausea medication earlier. Will attempt again later.  4:37 PM, 06/15/22 Selina Tapper Small Laksh Hinners MPT [place] [facility] [place] [phone]

## 2022-06-15 NOTE — Progress Notes (Addendum)
I was present with the medical student for this service. I personally verified the history of present illness, performed the physical exam, and made the plan for this encounter. I have verified the medical student's documentation and made modifications where appropriately. I have personally documented in my own words a brief history, physical, and plan below.     Doing fair. More alert. Updated family this AM again. Reiterated she is making progress but it is slow. Palliative will talk with family to help with goals of care.   NPO, can have meds and small spoon of apple sauce if needed for vital meds Otherwise npo TPN Labs  Zosyn and Micafungin  Curlene Labrum, MD [facility] [place], [place] [address] [phone] (office)  [facility] Progress Note  11 Days Post-Op  Subjective: Patient is alert and awake, improvement in energy since yesterday. Patient is mildly agitated but difficult to understand what patient was saying. Patient is not in acute distress. Son in room with her said she had no acute issues overnight. JP drain is dark brown fluid. Patient displays regular work of breathing on room air.  Objective: Vital signs in last 24 hours: Temp:  [98.3 F (36.8 C)-98.5 F (36.9 C)] 98.3 F (36.8 C) (07/12 2100) Pulse Rate:  [78-81] 78 (07/12 2100) Resp:  [18-20] 20 (07/12 2100) BP: (134-139)/(72) 139/72 (07/12 2100) SpO2:  [94 %-100 %] 94 % (07/12 2100) Last BM Date : 06/14/22  Intake/Output from previous day: 07/12 0701 - 07/13 0700 In: 1192.6 [I.V.:882.6; IV Piggyback:310] Out: 1230 [Urine:1200; Drains:30] Intake/Output this shift: No intake/output data recorded.  General appearance: No acute distress, alert, responds to questions, mildly agitated Resp: Regular work of breathing on room air, CTAB Cardio: Normal rate, regular rhythm, no murmurs Skin: Warm, dry Incision/Wound: JP drain dark brown  fluid  Lab Results:  Recent Labs    06/14/22 0608 06/15/22 0606  WBC 25.5* 25.9*  HGB 11.9* 11.3*  HCT 36.3 34.5*  PLT 354 475*   BMET Recent Labs    06/13/22 0531 06/15/22 0606  NA 141 142  K 3.5 4.5  CL 107 109  CO2 29 29  GLUCOSE 174* 182*  BUN 37* 34*  CREATININE 1.37* 1.13*  CALCIUM 9.2 10.2   PT/INR No results for input(s): "LABPROT", "INR" in the last 72 hours.  Studies/Results: No results found.  Anti-infectives: Anti-infectives (From admission, onward)    Start     Dose/Rate Route Frequency Ordered Stop   06/13/22 1400  Ampicillin-Sulbactam (UNASYN) 3 g in sodium chloride 0.9 % 100 mL IVPB  Status:  Discontinued        3 g 200 mL/hr over 30 Minutes Intravenous Every 8 hours 06/13/22 0821 06/13/22 0823   06/13/22 1000  Ampicillin-Sulbactam (UNASYN) 3 g in sodium chloride 0.9 % 100 mL IVPB        3 g 200 mL/hr over 30 Minutes Intravenous Every 12 hours 06/13/22 0823     06/11/22 2200  piperacillin-tazobactam (ZOSYN) IVPB 3.375 g  Status:  Discontinued        3.375 g 12.5 mL/hr over 240 Minutes Intravenous Every 8 hours 06/11/22 1415 06/13/22 0821   06/11/22 1500  piperacillin-tazobactam (ZOSYN) IVPB 3.375 g        3.375 g 100 mL/hr over 30 Minutes Intravenous  Once 06/11/22 1410 06/12/22 0940   06/07/22 0200  fluconazole (DIFLUCAN) IVPB 200 mg  Status:  Discontinued        200 mg  100 mL/hr over 60 Minutes Intravenous Every 24 hours 06/06/22 0901 06/06/22 1031   06/06/22 1200  micafungin (MYCAMINE) 100 mg in sodium chloride 0.9 % 100 mL IVPB        100 mg 105 mL/hr over 1 Hours Intravenous Every 24 hours 06/06/22 1031 06/20/22 2359   06/05/22 1800  piperacillin-tazobactam (ZOSYN) IVPB 3.375 g        3.375 g 12.5 mL/hr over 240 Minutes Intravenous Every 12 hours 06/05/22 0825 06/09/22 2000   06/04/22 0600  piperacillin-tazobactam (ZOSYN) IVPB 3.375 g  Status:  Discontinued        3.375 g 12.5 mL/hr over 240 Minutes Intravenous Every 8 hours 06/04/22  0219 06/05/22 0825   06/04/22 0315  fluconazole (DIFLUCAN) IVPB 200 mg  Status:  Discontinued        200 mg 100 mL/hr over 60 Minutes Intravenous Every 24 hours 06/04/22 0217 06/04/22 0219   06/04/22 0315  fluconazole (DIFLUCAN) IVPB 200 mg  Status:  Discontinued        200 mg 100 mL/hr over 60 Minutes Intravenous Every 24 hours 06/04/22 0219 06/06/22 0901   06/03/22 2130  ceFEPIme (MAXIPIME) 2 g in sodium chloride 0.9 % 100 mL IVPB        2 g 200 mL/hr over 30 Minutes Intravenous  Once 06/03/22 2127 06/03/22 2209   06/03/22 2130  metroNIDAZOLE (FLAGYL) IVPB 500 mg  Status:  Discontinued        500 mg 100 mL/hr over 60 Minutes Intravenous Every 12 hours 06/03/22 2127 06/04/22 0217       Assessment/Plan: s/p Procedure(s): POD 11; EXPLORATORY LAPAROTOMY with omental patch   Patient is a 76 y.o. F who underwent an EXPLORATORY LAPAROTOMY with omental patch and presented with septic shock.   - PRN for pain - Patient on room air, regular work of breathing - Patient's abdominal CT showed extraluminal air and contrast in the anterior central to upper abdomen and lies adjacent to surgical drain. Reimage Monday 7/17. Continue PPI BID - Patient has elevated total bilirubin (trending down today 7/13); US abdomen (7/10) showed no biliary dilation with sludge within gallbladder and mild wall thickening in the setting of ascites. Continue to monitor for cholecystic symptoms - Patient received PICC line insertion 7/10 and TPN re-started 7/10 - NPO except for vital meds (Aricept, atorvastatin, etc.) - Patient continuing to receive antibiotics for contained leak. 25.9 WBC today 7/13 - Patient continuing to receive micafungin for previous fungal infection (repeat blood culture on 7/4 - no growth after 5 days) - Delirium precautions   LOS: 11 days   Carolyn Erickson, Medical Student 06/15/2022

## 2022-06-15 NOTE — Progress Notes (Addendum)
Messaged Dr. Constance Haw and got verbal order to give pt meds with spoonful of applesauce. This LPN sat pt up in bed and with verbal prompts and son at bedside was able to take meds with no adverse reactions or gagging..Will continue to monitor

## 2022-06-15 NOTE — Progress Notes (Signed)
Dr. Constance Haw and This LPN at bedside with PT, pt's son and husband. To follow up with concerns and questions. Pt was lying in bed with eyes closed but able to be aroused when prompted.

## 2022-06-15 NOTE — Progress Notes (Addendum)
PHARMACY - TOTAL PARENTERAL NUTRITION CONSULT NOTE   Indication: gastric perforation  Patient Measurements: Height: '5\' 1"'$  (154.9 cm) Weight: 54.9 kg (121 lb 0.5 oz) (removed pillows except one/scd machine) IBW/kg (Calculated) : 47.8 TPN AdjBW (KG): 50.9 Body mass index is 22.87 kg/m.  Assessment:  Pharmacy consulted to dose TPN in patient with gastric perforation.  Glucose / Insulin:    156-189- 11 units Electrolytes: K 3.0> 3.5 , mag 1.6> 2.3 Corrected calcium 12.1 Renal: Scr 1.13 Hepatic: albumin 1.6 Tbili 3.9 TG 230> 161   GI Surgeries / Procedures: Exploratory laparotomy, gastrorrhaphy  Central access: PICC double lumen 7/10 TPN start date: 7/10  Nutritional Goals: Goal TPN rate is 70 mL/hr (provides 77 g of protein and 1444 kcals per day)  RD Assessment: Kcal:  1400-1600 Protein:  75-80 gr  Fluid:  >/= 1600 ml daily  Current Nutrition:  NPO  Plan:  Continue TPN at 70 mL/hr at 1800 Electrolytes in TPN: Na 89mq/L, K 40 mEq/L, Ca 0 mEq/L, Mg 7 mEq/L, and Phos 10 mmol/L. Cl:Ac 1:1 Add 10 units inuslin Add standard MVI and trace elements to TPN Continue Sensitive q6h SSI and adjust as needed  Monitor TPN labs on Mon/Thurs,   SMargot Ables PharmD Clinical Pharmacist 06/15/2022 8:35 AM

## 2022-06-15 NOTE — Consult Note (Signed)
Consultation Note Date: 06/15/2022   Patient Name: Carolyn Erickson  DOB: 1946-03-09  MRN: [id number]  Age / Sex: 76 y.o., female  PCP: Dettinger, Fransisca Kaufmann, MD Referring Physician: Virl Cagey, MD  Reason for Consultation: Establishing goals of care  HPI/Patient Profile: 76 y.o. female  with past medical history of HTN, left breast cancer in 2001, depression, abdominal hysterectomy, generalized pain for several months, being treated for gastritis/ulcer with PPI by PCP with referral to GI, as yet unseen, admitted on 06/03/2022 with exploratory laparotomy with omental patch for perforated gastric ulcer, initially presenting with septic shock which has resolved.   Clinical Assessment and Goals of Care: I have reviewed medical records including EPIC notes, labs and imaging, received report from RN, assessed the patient.  Carolyn Erickson is lying quietly in bed.  She greets me, making but not keeping eye contact.  She is alert, oriented to self and somewhat to situation.  She is able to make her basic needs known.  Her son, [name], is present at bedside.    We meet at bedside to discuss diagnosis prognosis, GOC, EOL wishes, disposition and options.  I introduced Palliative Medicine as specialized medical care for people living with serious illness. It focuses on providing relief from the symptoms and stress of a serious illness. The goal is to improve quality of life for both the patient and the family.  We discussed a brief life review of the patient.  Mr. and Mrs. Cirillo have been married for 60 years.  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  They have 3 children, daughter Romie Minus, sons [name] and Gerald Stabs.  [name] shares that Carolyn Erickson did not seem to have memory loss, but was noted recently to have confusion after this illness.  [name] tearfully shares that Carolyn Erickson father died of memory loss when [name] was about 78 years  old, with pneumonia.   He shares that [name] brother also died of memory loss 2021/09/21.  We then focused on their current illness.  We talk about Carolyn Erickson gastric ulcer perforation and the treatment plan.  I shared that Carolyn Erickson is to have TPN at least through Monday when she will have repeat scans of her belly.  We talk about bacteremia, fungemia, and the treatment plan including ID consult.   We talk about time for outcomes.  The natural disease trajectory and expectations at EOL were discussed.  [name] states that he believes that Carolyn Erickson will likely need some short-term rehab.  Advanced directives, concepts specific to code status, artifical feeding and hydration, and rehospitalization were considered and discussed.  We talk about the concept of "treat the treatable, but allowing natural passing".  [name] states that he is not sure if his parents have had these discussions.  I share that I will discuss this with Mr. Manrique.  Palliative Care services outpatient were explained and offered.  We did talk about the benefits of continued goals of care/CODE STATUS discussions.  I shared that I will discuss this  with Mr. Blecha.  Call to spouse of 68 years, Carolyn Erickson.  We talk about Carolyn Erickson acute health concerns.  We talk about the treatment plan.  We talk about disposition, at this point he feels that she would need short-term rehab, Carolyn Erickson is close to their home.  We talk about CODE STATUS, "treat the treatable, but allowing natural passing".  [name] states that they have never discussed CODE STATUS.  We talk about outpatient palliative services to continue goals of care/CODE STATUS discussions.  [name] states that he will discuss this with his children.  All questions answered, no issues at this time.  Discussed the importance of continued conversation with family and the medical providers regarding overall plan of care and treatment options, ensuring  decisions are within the context of the patient's values and GOCs.  Questions and concerns were addressed.  The family was encouraged to call with questions or concerns.  PMT will continue to support holistically.  Conference with attending general surgery, bedside nursing staff, transition of care team related to patient condition, needs, goals of care, disposition.   HCPOA  NEXT OF KIN -spouse of 52 years, Carolyn Erickson.  They have 3 children, [name], and Gerald Stabs.    SUMMARY OF RECOMMENDATIONS   At this point continue full scope/full code Time for outcomes Anticipate need for short-term rehab Considering outpatient palliative services   Code Status/Advance Care Planning: Full code -Mr. Erickson states that they have never discussed CODE STATUS.  We talk about the concept of "treat the treatable, but allowing natural passing".  Symptom Management:  Per surgeon, no additional needs at this time.  Palliative Prophylaxis:  Frequent Pain Assessment, Oral Care, and Turn Reposition  Additional Recommendations (Limitations, Scope, Preferences): Full Scope Treatment  Psycho-social/Spiritual:  Desire for further Chaplaincy support:no Additional Recommendations: Caregiving  Support/Resources  Prognosis:  Unable to determine, based on outcomes, continued declines would not be surprising based on advancing memory loss.  Discharge Planning:  To be determined, based on outcomes.  Anticipate need for short-term rehab.       Primary Diagnoses: Present on Admission:  Gastric perforation ([place])  Septic shock - RESOLVED  Essential hypertension  GERD (gastroesophageal reflux disease)  Mixed hyperlipidemia  Acute renal failure superimposed on stage 3a chronic kidney disease (HCC)  Major neurocognitive disorder (HCC)  Acute respiratory failure with hypoxia (HCC)  Hypomagnesemia  Fungemia  Leukocytosis   I have reviewed the medical record, interviewed the patient and family, and  examined the patient. The following aspects are pertinent.  Past Medical History:  Diagnosis Date   Cancer ([place]) 2001   breast left   Depression    Hypertension    Social History   Socioeconomic History   Marital status: Married    Spouse name: Dellis Filbert   Number of children: 3   Years of education: 11   Highest education level: Not on file  Occupational History   Occupation: retired  Tobacco Use   Smoking status: Never   Smokeless tobacco: Never  Vaping Use   Vaping Use: Never used  Substance and Sexual Activity   Alcohol use: Never   Drug use: Never   Sexual activity: Not on file    Comment: married for 61 years  Other Topics Concern   Not on file  Social History Narrative   Has 3 children, 6 grandkids   Lives home with husband.   2 of her children live nearby.   Right handed   Drinks  caffeine   Social Determinants of Health   Financial Resource Strain: Low Risk  (08/04/2021)   Overall Financial Resource Strain (CARDIA)    Difficulty of Paying Living Expenses: Not hard at all  Food Insecurity: No Food Insecurity (08/04/2021)   Hunger Vital Sign    Worried About Running Out of Food in the Last Year: Never true    Ran Out of Food in the Last Year: Never true  Transportation Needs: No Transportation Needs (08/04/2021)   PRAPARE - Hydrologist (Medical): No    Lack of Transportation (Non-Medical): No  Physical Activity: Inactive (04/12/2022)   Exercise Vital Sign    Days of Exercise per Week: 0 days    Minutes of Exercise per Session: 0 min  Stress: Stress Concern Present (04/12/2022)   [place]    Feeling of Stress : To some extent  Social Connections: Moderately Integrated (08/04/2021)   Social Connection and Isolation Panel [NHANES]    Frequency of Communication with Friends and Family: More than three times a week    Frequency of Social Gatherings with Friends and Family:  Once a week    Attends Religious Services: More than 4 times per year    Active Member of [employer] or Organizations: No    Attends Music therapist: Never    Marital Status: Married   Family History  Problem Relation Age of Onset   Diabetes Mother    Heart disease Mother    Alzheimer's disease Father    Parkinson's disease Sister    Alzheimer's disease Brother    Scheduled Meds:  amLODipine  2.5 mg Oral Daily   atorvastatin  20 mg Oral QPM   buPROPion  150 mg Oral BID   Chlorhexidine Gluconate Cloth  6 each Topical Daily   donepezil  10 mg Oral QHS   heparin  5,000 Units Subcutaneous Q8H   insulin aspart  0-9 Units Subcutaneous Q6H   mouth rinse  15 mL Mouth Rinse Q4H   pantoprazole (PROTONIX) IV  40 mg Intravenous Q12H   sodium chloride flush  10-40 mL Intracatheter Q12H   venlafaxine  37.5 mg Oral BID   Continuous Infusions:  ampicillin-sulbactam (UNASYN) IV Stopped (06/15/22 0021)   micafungin (MYCAMINE) 100 mg in sodium chloride 0.9 % 100 mL IVPB 100 mg (06/14/22 1315)   TPN ADULT (ION) 70 mL/hr at 06/15/22 0021   TPN ADULT (ION)     PRN Meds:.diphenhydrAMINE **OR** diphenhydrAMINE, iohexol, morphine injection, ondansetron **OR** ondansetron (ZOFRAN) IV, mouth rinse, sodium chloride flush Medications Prior to Admission:  Prior to Admission medications   Medication Sig Start Date End Date Taking? Authorizing Provider  amLODipine (NORVASC) 2.5 MG tablet TAKE 1 TABLET EVERY DAY 05/25/22  Yes Dettinger, Fransisca Kaufmann, MD  atorvastatin (LIPITOR) 20 MG tablet Take 1 tablet (20 mg total) by mouth daily. 01/30/22  Yes Dettinger, Fransisca Kaufmann, MD  buPROPion (WELLBUTRIN SR) 150 MG 12 hr tablet Take 1 tablet (150 mg total) by mouth 2 (two) times daily. 01/30/22  Yes Dettinger, Fransisca Kaufmann, MD  cholecalciferol (VITAMIN D3) 25 MCG (1000 UNIT) tablet Take 1,000 Units by mouth daily.   Yes [provider]  cyclobenzaprine (FLEXERIL) 10 MG tablet Take 1 tablet (10 mg total) by mouth  at bedtime as needed for muscle spasms (neck pain). 01/30/22  Yes Dettinger, Fransisca Kaufmann, MD  donepezil (ARICEPT) 10 MG tablet Take one tablet 10 mg daily 04/13/22  Yes Rondel Jumbo, PA-C  meloxicam (MOBIC) 7.5 MG tablet TAKE 1 TABLET EVERY DAY 10/05/21  Yes Dettinger, Fransisca Kaufmann, MD  pantoprazole (PROTONIX) 40 MG tablet Take 1 tablet (40 mg total) by mouth 2 (two) times daily before a meal. 06/02/22  Yes Dettinger, Fransisca Kaufmann, MD  venlafaxine (EFFEXOR) 37.5 MG tablet Take 1 tablet (37.5 mg total) by mouth 2 (two) times daily. 01/30/22  Yes Dettinger, Fransisca Kaufmann, MD   No Known Allergies Review of Systems  Unable to perform ROS: Acuity of condition    Physical Exam Vitals and nursing note reviewed.  Constitutional:      General: She is not in acute distress.    Appearance: She is ill-appearing.  Cardiovascular:     Rate and Rhythm: Normal rate.  Pulmonary:     Effort: Pulmonary effort is normal. No respiratory distress.  Skin:    General: Skin is warm and dry.  Neurological:     Mental Status: She is alert.     Comments: Oriented to self only, known dementia  Psychiatric:        Mood and Affect: Mood normal.        Behavior: Behavior normal.     Comments: Calm and cooperative, not fearful as long as family is at bedside     Vital Signs: BP 139/72 (BP Location: Left Arm)   Pulse 78   Temp 98.3 F (36.8 C) (Oral)   Resp 20   Ht '5\' 1"'$  (1.549 m)   Wt 54.9 kg Comment: removed pillows except one/scd machine  SpO2 94%   BMI 22.87 kg/m  Pain Scale: 0-10 POSS *See Group Information*: S-Acceptable,Sleep, easy to arouse Pain Score: 0-No pain   SpO2: SpO2: 94 % O2 Device:SpO2: 94 % O2 Flow Rate: .O2 Flow Rate (L/min): 0 L/min  IO: Intake/output summary:  Intake/Output Summary (Last 24 hours) at 06/15/2022 0910 Last data filed at 06/15/2022 2353 Gross per 24 hour  Intake 1192.62 ml  Output 1080 ml  Net 112.62 ml    LBM: Last BM Date : 06/14/22 Baseline Weight: Weight: 50.9 kg Most  recent weight: Weight: 54.9 kg (removed pillows except one/scd machine)     Palliative Assessment/Data:   Flowsheet Rows    Flowsheet Row Most Recent Value  Intake Tab   Referral Department Hospitalist  Unit at Time of Referral Cardiac/Telemetry Unit  Palliative Care Primary Diagnosis Sepsis/Infectious Disease  Date Notified 06/15/22  Palliative Care Type New Palliative care  Reason for referral Clarify Goals of Care  Date of Admission 06/03/22  Date first seen by Palliative Care 06/15/22  # of days Palliative referral response time 0 Day(s)  # of days IP prior to Palliative referral 12  Clinical Assessment   Palliative Performance Scale Score 30%  Pain Max last 24 hours Not able to report  Pain Min Last 24 hours Not able to report  Dyspnea Max Last 24 Hours Not able to report  Dyspnea Min Last 24 hours Not able to report  Psychosocial & Spiritual Assessment   Palliative Care Outcomes        Time In: 1040 Time Out: 1115 Time Total: 75 minutes  Greater than 50%  of this time was spent counseling and coordinating care related to the above assessment and plan.  Signed by: Drue Novel, NP   Please contact Palliative Medicine Team phone at [phone] for questions and concerns.  For individual provider: See Shea Evans

## 2022-06-15 NOTE — Progress Notes (Signed)
Dr. Adriana Simas notified through secure chat that family is refusing all PO medications due to patient having trouble swallowing.

## 2022-06-16 DIAGNOSIS — R6521 Severe sepsis with septic shock: Secondary | ICD-10-CM | POA: Diagnosis not present

## 2022-06-16 DIAGNOSIS — A419 Sepsis, unspecified organism: Secondary | ICD-10-CM | POA: Diagnosis not present

## 2022-06-16 LAB — CBC WITH DIFFERENTIAL/PLATELET
Abs Immature Granulocytes: 0.24 10*3/uL — ABNORMAL HIGH (ref 0.00–0.07)
Basophils Absolute: 0.1 10*3/uL (ref 0.0–0.1)
Basophils Relative: 0 %
Eosinophils Absolute: 0.3 10*3/uL (ref 0.0–0.5)
Eosinophils Relative: 1 %
HCT: 34.5 % — ABNORMAL LOW (ref 36.0–46.0)
Hemoglobin: 11.3 g/dL — ABNORMAL LOW (ref 12.0–15.0)
Immature Granulocytes: 1 %
Lymphocytes Relative: 8 %
Lymphs Abs: 2 10*3/uL (ref 0.7–4.0)
MCH: 30.5 pg (ref 26.0–34.0)
MCHC: 32.8 g/dL (ref 30.0–36.0)
MCV: 93 fL (ref 80.0–100.0)
Monocytes Absolute: 1.7 10*3/uL — ABNORMAL HIGH (ref 0.1–1.0)
Monocytes Relative: 7 %
Neutro Abs: 20.1 10*3/uL — ABNORMAL HIGH (ref 1.7–7.7)
Neutrophils Relative %: 83 %
Platelets: 527 10*3/uL — ABNORMAL HIGH (ref 150–400)
RBC: 3.71 MIL/uL — ABNORMAL LOW (ref 3.87–5.11)
RDW: 14.8 % (ref 11.5–15.5)
WBC: 24.4 10*3/uL — ABNORMAL HIGH (ref 4.0–10.5)
nRBC: 0 % (ref 0.0–0.2)

## 2022-06-16 LAB — GLUCOSE, CAPILLARY
Glucose-Capillary: 178 mg/dL — ABNORMAL HIGH (ref 70–99)
Glucose-Capillary: 147 mg/dL — ABNORMAL HIGH (ref 70–99)
Glucose-Capillary: 142 mg/dL — ABNORMAL HIGH (ref 70–99)
Glucose-Capillary: 140 mg/dL — ABNORMAL HIGH (ref 70–99)
Glucose-Capillary: 132 mg/dL — ABNORMAL HIGH (ref 70–99)

## 2022-06-16 MED ORDER — TRAVASOL 10 % IV SOLN
INTRAVENOUS | Status: AC
  Filled 2022-06-16: qty 772.8

## 2022-06-16 MED ORDER — SODIUM CHLORIDE 0.9 % IV SOLN
3.0000 g | Freq: Four times a day (QID) | INTRAVENOUS | Status: DC
  Administered 2022-06-16 – 2022-06-20 (×16): 3 g via INTRAVENOUS
  Filled 2022-06-16 (×3): qty 8
  Filled 2022-06-16: qty 3
  Filled 2022-06-16 (×13): qty 8
  Filled 2022-06-16: qty 3
  Filled 2022-06-16 (×2): qty 8

## 2022-06-16 NOTE — Progress Notes (Signed)
PROGRESS NOTE  Carolyn Erickson XAJ:[id number] DOB: 1946/04/03 DOA: 06/03/2022 PCP: Dettinger, Fransisca Kaufmann, MD  Brief History:  76 year old female with history of major neurocognitive disorder, hypertension, hyperlipidemia, depression, left breast cancer presenting with generalized weakness and abdominal pain.  Apparently, the patient has been having abdominal pain since April 2023.  The patient had been started on pantoprazole at that time.  There was no improvement.  She followed up with her PCP on 06/02/2022.  Referral was given to see gastroenterology.  Abdominal pain had worsened over the past 2 days prior to admission with worsening generalized weakness.  There is no fevers, chills, chest pain, shortness breath, vomiting, diarrhea.  There is no hematochezia or melena noted.  In the emergency department, the patient was hypotensive with a blood pressure of 71/54.  She was somnolent.  CT of the abdomen and pelvis was performed and showed changes consistent with a perforated gastric ulcer.  Dr. Constance Haw was consulted, and the patient was taken to the OR for exploratory laparotomy, gastrorrhaphy, omental patch.  A left IJ central line was also placed.  TRH is consulted for medical management  In the ED, the patient had low-grade fever 99.5 F.  She was tachycardic and hypotensive with blood pressure 68/53.  WBC 4.1, hemoglobin 14.6, platelets 308,000.  BMP showed sodium 137, potassium 4.1, bicarbonate 22, serum creatinine 2.14.  Postoperatively, the patient was started on Zosyn and fluconazole.  She remained on the ventilator.  Preoperative chest x-ray showed bibasilar atelectasis.  There was free air under the diaphragm.   Assessment and Plan: * Septic shock - RESOLVED Presented with hypotension, tachycardia, and lactic acid peaked at 6.8 Pt successfully weaned off norepinephrine infusion Secondary to gastric perforation with peritonitis - Patient s/p Ex lap, gastrorrhaphy and omental patch.   06/03/22 blood cultures--C. glabrata Continue IV Unasyn per ID  discontinued fluconazole per ID,  -continue echinocandin (micafungin) -Leukocytosis persist, -Urine  culture NGTD -Repeat CT abdomen and pelvis planned for Monday, 06/19/2022 to look for possible leak  Gastric perforation ([place]) Suspect this may be due to chronic NSAID use -Continue PPI twice daily -06/04/2022--ex laparotomy with omental patch -Postoperative care per Dr. Constance Haw -TNA therapy on hold temporarily due to need for CVL holiday -UGI 7/6 - no leak seen.  7/9 - CT c/w leak present - PICC line placed 7/10 and TPN started 7/10 - surgery planning to repeat CT on Monday 06/19/22  Fungemia 06/03/22 blood cultures--Candida glabrata -d/c fluconazole per ID consult -continue echinocandin per ID  -discussed with general surgery>>removed central line 7/4 for line holiday -repeat blood culture on 06/06/22 NGTD  Acute renal failure superimposed on stage 3a chronic kidney disease (HCC) -Serum creatinine peaked at 2.28 but trending down  -Secondary to hemodynamic changes/sepsis and hypovolemia -Creatinine is down to 1.1 -renally adjust medications, avoid nephrotoxic agents / dehydration  / hypotension  Leukocytosis -- WBC peaked at 31 K -Leukocytosis persist -- appreciate ID and surgery recommendations,  -continue antifungal and Unasyn  Hyperbilirubinemia -- secondary to persistent leak.  Continuing to monitor.   Volume overload -- treated with IV lasix 40 mg x 2 on 7/7 and resolved  Hypomagnesemia IV replacement ordered and repleted, continue monitoring   Acute respiratory failure with hypoxia (HCC) Currently PRVC, RR 20, Vt 380, FiO2 0.4 -7/3 personally reviewed CXR--increased interstitial marking -7/3 ABG--7.35/36/107/20 (0.4) -7/3--extubated -7/4-5--stable on 4L [place] -7/6 - down to 2L/min [place]  Major neurocognitive disorder [facility]) Patient had  MoCa 17/30 on 03/14/22 -resumed donezepil, venlafaxine, bupropion  06/12/22  GERD (gastroesophageal reflux disease) Continue pantoprazole IV twice daily  Essential hypertension  amlodipine 2.5 mg daily  Mixed hyperlipidemia -- resumed home atorvastatin   Family Communication:   Discussed with husband at bedside  consultants:  general surgery  Code Status:  FULL   DVT Prophylaxis:  SCDs  Procedures: As Listed in Progress Note Above  Antibiotics: Fluconazole 7/2>>7/4 Micafungin 7/4>> Zosyn 7/2>>7/11 Unasyn 7/11>>  Subjective: -husband at bedside, questions answered -Patient is sitting up in the chair -No fevers  Objective: Vitals:   06/15/22 1348 06/15/22 2141 06/16/22 0526 06/16/22 1259  BP: (!) 145/91 140/76 (!) 152/88 (!) 152/76  Pulse: 79 85 76 73  Resp: 18 (!) '21 18 16  '$ Temp: 98 F (36.7 C) 98.4 F (36.9 C) 98.7 F (37.1 C) 98 F (36.7 C)  TempSrc: Oral Oral  Oral  SpO2: 99% 94% 93% 96%  Weight:      Height:        Intake/Output Summary (Last 24 hours) at 06/16/2022 1906 Last data filed at 06/16/2022 1849 Gross per 24 hour  Intake 1810.98 ml  Output 2700 ml  Net -889.02 ml   Physical Exam  Gen:- Awake Alert, in no acute distress  HEENT:- [place].AT, No sclera icterus Neck-Supple Neck,No JVD,.  Lungs-  CTAB , fair air movement bilaterally  CV- S1, S2 normal, RRR Abd-  +ve B.Sounds, Abd Soft, mild abdominal discomfort with palpation, JP drain with dark brownish fluid Extremity/Skin:- good pedal pulses  Psych-affect is appropriate, oriented x3 Neuro-generalized weakness, no new focal deficits, no tremors   Data Reviewed: I have personally reviewed following labs and imaging studies Basic Metabolic Panel: Recent Labs  Lab 06/10/22 0549 06/11/22 0707 06/12/22 0514 06/13/22 0531 06/15/22 0606  NA 146* 145 142 141 142  K 3.9 3.3* 3.0* 3.5 4.5  CL 109 107 107 107 109  CO2 '29 30 31 29 29  '$ GLUCOSE 112* 118* 129* 174* 182*  BUN 54* 51* 40* 37* 34*  CREATININE 1.97* 1.66* 1.44* 1.37* 1.13*  CALCIUM 9.2 9.6 9.2 9.2  10.2  MG 2.0  --  1.6* 2.3 1.8  PHOS  --   --  3.5 3.5 2.7   Liver Function Tests: Recent Labs  Lab 06/10/22 0549 06/11/22 0707 06/12/22 0514 06/13/22 0531 06/15/22 0606  AST '22 30 27 27 '$ 35  ALT '14 18 18 18 25  '$ ALKPHOS 91 122 99 92 101  BILITOT 3.8* 4.8* 4.6* 4.6* 3.9*  PROT 4.7* 5.6* 4.7* 4.7* 5.3*  ALBUMIN <1.5* 1.8* <1.5* <1.5* 1.6*   No results for input(s): "LIPASE", "AMYLASE" in the last 168 hours.  No results for input(s): "AMMONIA" in the last 168 hours. Coagulation Profile: No results for input(s): "INR", "PROTIME" in the last 168 hours.  CBC: Recent Labs  Lab 06/12/22 0514 06/13/22 0531 06/14/22 0608 06/15/22 0606 06/16/22 0609  WBC 30.3* 28.3* 25.5* 25.9* 24.4*  NEUTROABS 26.2* 24.7* 22.4* 21.8* 20.1*  HGB 11.4* 11.4* 11.9* 11.3* 11.3*  HCT 35.9* 35.4* 36.3 34.5* 34.5*  MCV 92.3 92.4 92.4 92.7 93.0  PLT 247 304 354 475* 527*   Cardiac Enzymes: No results for input(s): "CKTOTAL", "CKMB", "CKMBINDEX", "TROPONINI" in the last 168 hours. BNP: Invalid input(s): "POCBNP" CBG: Recent Labs  Lab 06/15/22 1743 06/16/22 0057 06/16/22 0535 06/16/22 1126 06/16/22 1619  GLUCAP 149* 178* 147* 142* 140*   HbA1C: No results for input(s): "HGBA1C" in the last 72 hours. Urine analysis:  Component Value Date/Time   COLORURINE AMBER (A) 06/04/2022 0520   APPEARANCEUR CLOUDY (A) 06/04/2022 0520   APPEARANCEUR Cloudy (A) 04/13/2021 1530   LABSPEC 1.027 06/04/2022 0520   PHURINE 5.0 06/04/2022 0520   GLUCOSEU NEGATIVE 06/04/2022 0520   HGBUR NEGATIVE 06/04/2022 0520   BILIRUBINUR NEGATIVE 06/04/2022 0520   BILIRUBINUR Negative 04/13/2021 1530   KETONESUR NEGATIVE 06/04/2022 0520   PROTEINUR 100 (A) 06/04/2022 0520   NITRITE NEGATIVE 06/04/2022 0520   LEUKOCYTESUR SMALL (A) 06/04/2022 0520    No results found for this or any previous visit (from the past 240 hour(s)).    Scheduled Meds:  amLODipine  2.5 mg Oral Daily   atorvastatin  20 mg Oral QPM    buPROPion  150 mg Oral BID   Chlorhexidine Gluconate Cloth  6 each Topical Daily   donepezil  10 mg Oral QHS   heparin  5,000 Units Subcutaneous Q8H   insulin aspart  0-9 Units Subcutaneous Q6H   mouth rinse  15 mL Mouth Rinse Q4H   pantoprazole (PROTONIX) IV  40 mg Intravenous Q12H   sodium chloride flush  10-40 mL Intracatheter Q12H   venlafaxine  37.5 mg Oral BID   Continuous Infusions:  ampicillin-sulbactam (UNASYN) IV 3 g (06/16/22 1628)   micafungin (MYCAMINE) 100 mg in sodium chloride 0.9 % 100 mL IVPB 100 mg (06/16/22 1509)   TPN ADULT (ION) 70 mL/hr at 06/16/22 1649    Procedures/Studies: US Abdomen Limited RUQ (LIVER/GB)  Result Date: 06/12/2022 CLINICAL DATA:  Hyperbilirubinemia EXAM: ULTRASOUND ABDOMEN LIMITED RIGHT UPPER QUADRANT COMPARISON:  CT abdomen and pelvis 06/11/2022 FINDINGS: Gallbladder: Sludge within gallbladder. No definite shadowing calculi. Mild gallbladder wall thickening. No sonographic Murphy sign. Common bile duct: Diameter: 3 mm, normal Liver: Normal echogenicity without mass or nodularity. No intrahepatic biliary dilatation. Portal vein is patent on color Doppler imaging with normal direction of blood flow towards the liver. Other: Perihepatic ascites identified, containing septations/loculations. IMPRESSION: Sludge within gallbladder with mild gallbladder wall thickening, a nonspecific finding in the setting of ascites. No biliary dilatation or focal hepatic abnormality. Electronically Signed   By: Lavonia Dana M.D.   On: 06/12/2022 14:40   [place] EKG SITE RITE  Result Date: 06/11/2022 If Site Rite image not attached, placement could not be confirmed due to current cardiac rhythm.  CT ABDOMEN PELVIS WO CONTRAST  Result Date: 06/11/2022 CLINICAL DATA:  EXPLORATORY LAPAROTOMY, omental patchImpression: Worsening leukocytosis. EXAM: CT ABDOMEN AND PELVIS WITHOUT CONTRAST TECHNIQUE: Multidetector CT imaging of the abdomen and pelvis was performed following the  standard protocol without IV contrast. RADIATION DOSE REDUCTION: This exam was performed according to the departmental dose-optimization program which includes automated exposure control, adjustment of the mA and/or kV according to patient size and/or use of iterative reconstruction technique. COMPARISON:  06/03/2022. FINDINGS: Lower chest: Moderate pleural effusions. Dependent lower lobe and right middle lobe opacity, likely atelectasis. Additional ground-glass opacities are noted, most evident in the right middle lobe, suspicious for infection. Effusions are increased on the left and new on the right since the prior CT. Lung base opacities are mostly new. Hepatobiliary: No focal liver abnormality is seen. No gallstones, gallbladder wall thickening, or biliary dilatation. Pancreas: Unremarkable. No pancreatic ductal dilatation or surrounding inflammatory changes. Spleen: Normal in size without focal abnormality. Adrenals/Urinary Tract: No adrenal masses. No renal masses or hydronephrosis. Bladder is unremarkable. Stomach/Bowel: Stomach is mostly decompressed. There is an apparent defect along the anterior aspect of the mid to distal stomach through which contrast  and air has collected. The ill-defined collection of contrast and air lies in the mid to upper abdomen near the midline, with an adjacent surgical drain, and deep to the midline laparotomy incision. Poorly defined small bowel loops in the central abdomen. There is no colon or small bowel dilation to suggest obstruction. No colonic wall thickening or convincing inflammation. Vascular/Lymphatic: Aortic atherosclerosis. Prominent lymph nodes noted along the gastrohepatic ligament. There are no enlarged lymph nodes. Reproductive: Status post hysterectomy. No adnexal masses. Other: Small amount of ascites. Generalized increased attenuation of the mesenteric/peritoneal fat consistent with edema. Diffuse subcutaneous soft tissue edema. Musculoskeletal: No acute  finding. IMPRESSION: 1. There is no defined fluid collection to indicate an abscess. 2. There is extraluminal air and contrast in the anterior central to upper abdomen, that appears to be arising from a defect along the anterior gastric wall. The extraluminal contrast and air lies adjacent to the surgical drain. 3. Small amount of ascites is similar to the previous CT. 4. Mesenteric/peritoneal and diffuse subcutaneous edema. 5. Moderate pleural effusions with associated lung base atelectasis. Additional areas of ground-glass lung opacity are suspicious for multifocal pneumonia. Electronically Signed   By: Lajean Manes M.D.   On: 06/11/2022 12:56   DG CHEST PORT 1 VIEW  Result Date: 06/09/2022 CLINICAL DATA:  Weakness, wheezing EXAM: PORTABLE CHEST 1 VIEW COMPARISON:  06/05/2022 FINDINGS: Interval endotracheal and esophagogastric extubation. Small to moderate bilateral pleural effusions, increased compared to prior examination, with associated atelectasis or consolidation and diffuse bilateral interstitial pulmonary opacity. Cardiomegaly. IMPRESSION: 1. Small to moderate bilateral pleural effusions, increased compared to prior examination, with associated atelectasis or consolidation and diffuse bilateral interstitial pulmonary opacity. Findings are most consistent with worsened edema. 2. Interval endotracheal and esophagogastric extubation. 3. Cardiomegaly. Electronically Signed   By: Delanna Ahmadi M.D.   On: 06/09/2022 12:25   DG UGI W SMALL BOWEL  Result Date: 06/08/2022 CLINICAL DATA:  Post repair of 1.5 cm diameter perforated anterior wall gastric antral ulcer EXAM: WATER SOLUBLE UPPER GI SERIES TECHNIQUE: Single-column upper GI series was performed using water soluble contrast. Radiation Exposure Index (as provided by the fluoroscopic device): 43.1 mGy Kerma CONTRAST:  100 cc Omnipaque 300 via NG tube COMPARISON:  CT abdomen and pelvis 06/03/2022 FLUOROSCOPY: Fluoroscopy Time:  2 minutes 36 seconds  Radiation Exposure Index (if provided by the fluoroscopic device): 43.1 Number of Acquired Spot Images: Multiple fluoroscopic screen captures and cine series FINDINGS: Contrast administered via NG tube opacifies the gastric lumen. Patent pylorus with passage of contrast through normal appearing duodenal C loop into proximal jejunum with incidentally noted duodenal diverticulum at second portion. Irregularity of rugal folds with thickening and distortion at anterior wall of gastric antrum at site of perforation repair. The third portion of the duodenum is superimposed with the inferior wall of the gastric antrum, mildly limiting exam. A blush of contrast is seen inferior to the antrum, superimposed with the third portion of the duodenum throughout the study. This appears to move in concert with the duodenum with respiration. No contrast opacification of the JP drain. No gross evidence of contrast leak identified though it is difficult to completely exclude a small leak at the site of repair. IMPRESSION: Distortion and thickening of rugal folds at site of antral gastric ulcer repair. No gross evidence of leak at the site of ulcer repair as discussed above. Electronically Signed   By: Lavonia Dana M.D.   On: 06/08/2022 15:32   ECHOCARDIOGRAM COMPLETE  Result Date: 06/07/2022  ECHOCARDIOGRAM REPORT   Patient Name:   SHALLEN LUEDKE Date of Exam: 06/07/2022 Medical Rec #:  [id number]       Height:       61.0 in Accession #:    [id number]      Weight:       112.2 lb Date of Birth:  1946-05-25        BSA:          1.477 m Patient Age:    13 years        BP:           116/46 mmHg Patient Gender: F               HR:           104 bpm. Exam Location:  [facility] Procedure: 2D Echo, Cardiac Doppler and Color Doppler Indications:    Bacteremia  History:        Patient has no prior history of Echocardiogram examinations.                 Signs/Symptoms:Bacteremia; Risk Factors:Hypertension and                 Dyslipidemia. Breast  CA.  Sonographer:    Wenda Low Referring Phys: [id number] [place]  1. Abnormal septal motion . Left ventricular ejection fraction, by estimation, is 55 to 60%. The left ventricle has normal function. The left ventricle has no regional wall motion abnormalities. Left ventricular diastolic parameters were normal.  2. Right ventricular systolic function is normal. The right ventricular size is normal. There is mildly elevated pulmonary artery systolic pressure.  3. Moderate pleural effusion in the left lateral region.  4. The mitral valve is abnormal. Trivial mitral valve regurgitation. No evidence of mitral stenosis.  5. Thickening and calcification noted . The tricuspid valve is abnormal.  6. Some thickening in the LVOT near intervalvular fibrosa Given this and nodular calcification of PV suggest TEE if suspicion for SBE high. The aortic valve is tricuspid. There is moderate calcification of the aortic valve. There is moderate thickening of the aortic valve. Aortic valve regurgitation is mild. Aortic valve sclerosis/calcification is present, without any evidence of aortic stenosis.  7. Nodular calcification seen on PV. The pulmonic valve was abnormal.  8. The inferior vena cava is normal in size with greater than 50% respiratory variability, suggesting right atrial pressure of 3 mmHg. FINDINGS  Left Ventricle: Abnormal septal motion. Left ventricular ejection fraction, by estimation, is 55 to 60%. The left ventricle has normal function. The left ventricle has no regional wall motion abnormalities. The left ventricular internal cavity size was normal in size. There is no left ventricular hypertrophy. Left ventricular diastolic parameters were normal. Right Ventricle: The right ventricular size is normal. No increase in right ventricular wall thickness. Right ventricular systolic function is normal. There is mildly elevated pulmonary artery systolic pressure. The tricuspid regurgitant velocity is  3.21  m/s, and with an assumed right atrial pressure of 3 mmHg, the estimated right ventricular systolic pressure is 47.6 mmHg. Left Atrium: Left atrial size was normal in size. Right Atrium: Right atrial size was normal in size. Pericardium: There is no evidence of pericardial effusion. Mitral Valve: The mitral valve is abnormal. There is mild thickening of the mitral valve leaflet(s). There is mild calcification of the mitral valve leaflet(s). Mild mitral annular calcification. Trivial mitral valve regurgitation. No evidence of mitral valve stenosis. MV peak gradient, 3.9 mmHg. The  mean mitral valve gradient is 1.0 mmHg. Tricuspid Valve: Thickening and calcification noted. The tricuspid valve is abnormal. Tricuspid valve regurgitation is trivial. No evidence of tricuspid stenosis. Aortic Valve: Some thickening in the LVOT near intervalvular fibrosa Given this and nodular calcification of PV suggest TEE if suspicion for SBE high. The aortic valve is tricuspid. There is moderate calcification of the aortic valve. There is moderate thickening of the aortic valve. Aortic valve regurgitation is mild. Aortic valve sclerosis/calcification is present, without any evidence of aortic stenosis. Aortic valve mean gradient measures 5.0 mmHg. Aortic valve peak gradient measures 10.3 mmHg. Aortic valve area, by VTI measures 2.02 cm. Pulmonic Valve: Nodular calcification seen on PV. The pulmonic valve was abnormal. Pulmonic valve regurgitation is trivial. No evidence of pulmonic stenosis. Aorta: The aortic root is normal in size and structure. Venous: The inferior vena cava is normal in size with greater than 50% respiratory variability, suggesting right atrial pressure of 3 mmHg. IAS/Shunts: No atrial level shunt detected by color flow Doppler. Additional Comments: There is a moderate pleural effusion in the left lateral region.  LEFT VENTRICLE PLAX 2D LVIDd:         4.50 cm     Diastology LVIDs:         3.00 cm     LV e'  medial:    7.29 cm/s LV PW:         0.80 cm     LV E/e' medial:  11.8 LV IVS:        0.80 cm     LV e' lateral:   9.79 cm/s LVOT diam:     1.80 cm     LV E/e' lateral: 8.8 LV SV:         55 LV SV Index:   37 LVOT Area:     2.54 cm  LV Volumes (MOD) LV vol d, MOD A2C: 43.7 ml LV vol d, MOD A4C: 52.7 ml LV vol s, MOD A2C: 17.3 ml LV vol s, MOD A4C: 23.8 ml LV SV MOD A2C:     26.4 ml LV SV MOD A4C:     52.7 ml LV SV MOD BP:      28.6 ml RIGHT VENTRICLE RV Basal diam:  3.10 cm RV Mid diam:    2.90 cm RV S prime:     14.50 cm/s TAPSE (M-mode): 3.0 cm LEFT ATRIUM             Index        RIGHT ATRIUM           Index LA diam:        3.60 cm 2.44 cm/m   RA Area:     11.50 cm LA Vol (A2C):   50.9 ml 34.45 ml/m  RA Volume:   21.80 ml  14.76 ml/m LA Vol (A4C):   43.7 ml 29.58 ml/m LA Biplane Vol: 49.8 ml 33.71 ml/m  AORTIC VALVE                     PULMONIC VALVE AV Area (Vmax):    1.92 cm      PV Vmax:       0.98 m/s AV Area (Vmean):   1.84 cm      PV Peak grad:  3.9 mmHg AV Area (VTI):     2.02 cm AV Vmax:           160.50 cm/s AV Vmean:  103.550 cm/s AV VTI:            0.274 m AV Peak Grad:      10.3 mmHg AV Mean Grad:      5.0 mmHg LVOT Vmax:         121.00 cm/s LVOT Vmean:        74.800 cm/s LVOT VTI:          0.218 m LVOT/AV VTI ratio: 0.79  AORTA Ao Root diam: 3.30 cm MITRAL VALVE               TRICUSPID VALVE MV Area (PHT): 6.02 cm    TR Peak grad:   41.2 mmHg MV Area VTI:   2.25 cm    TR Vmax:        321.00 cm/s MV Peak grad:  3.9 mmHg MV Mean grad:  1.0 mmHg    SHUNTS MV Vmax:       0.99 m/s    Systemic VTI:  0.22 m MV Vmean:      48.8 cm/s   Systemic Diam: 1.80 cm MV Decel Time: 126 msec MV E velocity: 86.00 cm/s MV A velocity: 72.70 cm/s MV E/A ratio:  1.18 Jenkins Rouge MD Electronically signed by Jenkins Rouge MD Signature Date/Time: 06/07/2022/10:28:36 AM    Final    DG CHEST PORT 1 VIEW  Result Date: 06/05/2022 CLINICAL DATA:  76 year old female with respiratory failure. Intubated. EXAM:  PORTABLE CHEST 1 VIEW COMPARISON:  Portable chest 06/04/2022 and earlier. FINDINGS: Portable AP semi upright view at 0527 hours. Endotracheal tube tip in good position between the clavicles and carina. Stable left IJ central line and visible enteric tube. Increased left lung base and retrocardiac opacity now mostly obscuring the left hemidiaphragm. Slightly lower lung volumes overall. Mediastinal contours remain normal. No pneumothorax, pulmonary edema, or definite effusion. Negative right lung. Partially visible midline abdominal skin staples. Paucity of bowel gas in the upper abdomen. Stable left chest and axillary surgical clips. IMPRESSION: 1. Stable lines and tubes. 2. Increased left lower lobe collapse or consolidation since yesterday. Electronically Signed   By: Genevie Ann M.D.   On: 06/05/2022 08:26   DG Chest Port 1 View  Result Date: 06/04/2022 CLINICAL DATA:  Endotracheal tube, nasogastric tube and central line placement. EXAM: PORTABLE CHEST 1 VIEW COMPARISON:  June 03, 2022 FINDINGS: Since the prior study there has been interval placement of an endotracheal tube. Its distal tip is seen approximately 3.8 cm from the carina. Interval nasogastric tube placement is also seen. Its distal and extends into the gastric lumen. There is a left internal jugular venous catheter with its distal tip overlying the distal superior vena cava. This is approximately 6 mm proximal to the junction of the superior vena cava and right atrium. The heart size and mediastinal contours are within normal limits. Mild to moderate severity atelectasis and/or infiltrate is seen within the left lung base. This represents a new finding when compared to the prior study. There is no evidence of a pleural effusion or pneumothorax. Radiopaque surgical clips are seen along the left axilla. The air seen below the right hemidiaphragm on the prior study is no longer visualized. The visualized skeletal structures are unremarkable. IMPRESSION: 1.  Interval endotracheal tube, nasogastric tube and left internal jugular venous catheter placement positioning, as described above. 2. Mild to moderate severity left basilar atelectasis and/or infiltrate. Electronically Signed   By: Virgina Norfolk M.D.   On: 06/04/2022 02:47   CT Abdomen Pelvis  Wo Contrast  Result Date: 06/03/2022 CLINICAL DATA:  Severe weakness for 2 days with acute abdominal pain, initial encounter EXAM: CT ABDOMEN AND PELVIS WITHOUT CONTRAST TECHNIQUE: Multidetector CT imaging of the abdomen and pelvis was performed following the standard protocol without IV contrast. RADIATION DOSE REDUCTION: This exam was performed according to the departmental dose-optimization program which includes automated exposure control, adjustment of the mA and/or kV according to patient size and/or use of iterative reconstruction technique. COMPARISON:  None Available. FINDINGS: Lower chest: Basilar atelectatic changes are noted. Hepatobiliary: Liver is within normal limits. Gallbladder demonstrates a normal appearance. Pancreas: Unremarkable. No pancreatic ductal dilatation or surrounding inflammatory changes. Spleen: Normal in size without focal abnormality. Adrenals/Urinary Tract: Adrenal glands are within normal limits. Kidneys demonstrate tiny nonobstructing stones on right in the lower pole. No ureteral obstruction is seen. The bladder is decompressed. Stomach/Bowel: Considerable fecal material is noted within the rectal vault which may represent a focal impaction. Diverticular change of the colon is noted as well as some generalized wall thickening throughout the colon. The appendix is within normal limits. Small bowel is within normal limits. Stomach demonstrates a defect in the anterior gastric wall consistent with a perforated ulcer. There is a considerable amount of air and fluid throughout the abdomen with apparent direct communication with the gastric best seen on image number 36 of series 2 and image  number 78 of series 6. Vascular/Lymphatic: Aortic atherosclerosis. No enlarged abdominal or pelvic lymph nodes. Reproductive: Status post hysterectomy. No adnexal masses. Other: Free air and free fluid similar to that described above throughout the abdomen. The bowel demonstrates some reactive wall thickening related to the fluid and air. Musculoskeletal: Degenerative changes of the lumbar spine are seen. No other bony abnormality is noted. IMPRESSION: Changes consistent with a perforated gastric ulcer anteriorly with considerable spillage of fluid and air into the abdominal cavity. Some reactive wall thickening is noted within the bowel loops secondary to these changes. Critical Value/emergent results were called by telephone at the time of interpretation on 06/03/2022 at 10:43 pm to Dr. Aletta Edouard , who verbally acknowledged these results. Electronically Signed   By: Inez Catalina M.D.   On: 06/03/2022 22:45   DG Chest Port 1 View  Result Date: 06/03/2022 CLINICAL DATA:  Weakness. EXAM: PORTABLE CHEST 1 VIEW COMPARISON:  None Available. FINDINGS: Multiple overlying radiopaque cardiac lead wires are seen. The heart size and mediastinal contours are within normal limits. Mild, diffuse, chronic appearing increased lung markings are seen. There is no evidence of an acute infiltrate, pleural effusion or pneumothorax. Radiopaque surgical clips are seen along the lateral aspect of the left chest wall. A crescentic area of air is seen just below the right hemidiaphragm. Degenerative changes seen throughout the thoracic spine. IMPRESSION: 1. Chronic appearing increased lung markings without evidence of acute or active cardiopulmonary disease. 2. Air seen just below the right hemidiaphragm which may represent an air filled loop of colon. Further evaluation with abdomen pelvis CT is recommended, as the presence of intra-abdominal free air cannot be excluded. Electronically Signed   By: Virgina Norfolk M.D.   On:  06/03/2022 21:22     Roxan Hockey, MD How to contact the [facility] Attending or Consulting provider [place] or covering provider during after hours [place], for this patient?  Check the care team in [facility] and look for a) attending/consulting [facility] provider listed and b) the [facility] team listed Log into [email or web address] and use [place]'s universal password to access.  If you do not have the password, please contact the hospital operator. Locate the [facility] provider you are looking for under [facility] and page to a number that you can be directly reached. If you still have difficulty reaching the provider, please page the [facility] (Director on Call) for the Hospitalists listed on amion for assistance.   [facility]  If 7PM-7AM, please contact night-coverage [email or web address] Password [email or web address] 06/16/2022, 7:06 PM   LOS: 12 days

## 2022-06-16 NOTE — Progress Notes (Signed)
PHARMACY - TOTAL PARENTERAL NUTRITION CONSULT NOTE   Indication: gastric perforation  Patient Measurements: Height: '5\' 1"'$  (154.9 cm) Weight: 54.9 kg (121 lb 0.5 oz) (removed pillows except one/scd machine) IBW/kg (Calculated) : 47.8 TPN AdjBW (KG): 50.9 Body mass index is 22.87 kg/m.  Assessment:  Pharmacy consulted to dose TPN in patient with gastric perforation.  Glucose / Insulin:    147-161- 6 units Electrolytes: K 3.0> 3.5 , mag 1.6> 2.3 Corrected calcium 12.1 Renal: Scr 1.13 Hepatic: albumin 1.6 Tbili 3.9 TG 230> 161   GI Surgeries / Procedures: Exploratory laparotomy, gastrorrhaphy  Central access: PICC double lumen 7/10 TPN start date: 7/10  Nutritional Goals: Goal TPN rate is 70 mL/hr (provides 77 g of protein and 1444 kcals per day)  RD Assessment: Kcal:  1400-1600 Protein:  75-80 gr  Fluid:  >/= 1600 ml daily  Current Nutrition:  NPO  Plan:  Continue TPN at 70 mL/hr at 1800 Electrolytes in TPN: Na 28mq/L, K 40 mEq/L, Ca 0 mEq/L, Mg 7 mEq/L, and Phos 10 mmol/L. Cl:Ac 1:1 Continue 10 units inuslin in TPN  standard MVI and trace elements to TPN Continue Sensitive q6h SSI and adjust as needed  Monitor TPN labs on Mon/Thurs,   GThomasenia Sales PharmD, [facility] HospitalClinical Pharmacist  06/16/2022 7:51 AM

## 2022-06-16 NOTE — Progress Notes (Signed)
Physical Therapy Treatment Patient Details Name: Carolyn Erickson MRN: [id number] DOB: May 28, 1946 Today's Date: 06/16/2022   History of Present Illness Carolyn Erickson is a 76 yo with a history of HTN and depression who comes in with weakness and abdominal pain. She has been having some pain generalized for months being treated for gastritis/ ulcer with PPI by PCP and referred to GI. She has not seen GI. She has a history of diarrhea and constipation. She says over the last few days she was feeling weaker and her pain because worse and more severe acutely. She denies any chest pain or SOB. She has no fever. She has been hypotensive and required fluid bolus and is on levophed at 2 in the ED.    PT Comments    Patient demonstrates slow labored movement for rolling to side and sitting up from side lying position with fair carryover for using bed rail, once seated frequent leaning backwards requiring verbal/tactile cueing to maintain sitting balance and fair carryover for completing BLE ROM/strengthening exercises possibly due to [facility].  Patient limited to a few slow labored unsteady side steps at bedside with Mod/max assist using RW before having to sit due to fatigue, poor standing balance and fall risk.  Patient tolerated sitting up in chair after therapy with spouse present in room.  Patient will benefit from continued skilled physical therapy in hospital and recommended venue below to increase strength, balance, endurance for safe ADLs and gait.     Recommendations for follow up therapy are one component of a multi-disciplinary discharge planning process, led by the attending physician.  Recommendations may be updated based on patient status, additional functional criteria and insurance authorization.  Follow Up Recommendations  Skilled nursing-short term rehab (<3 hours/day) Can patient physically be transported by private vehicle: Yes   Assistance Recommended at Discharge Intermittent  Supervision/Assistance  Patient can return home with the following A lot of help with walking and/or transfers;Help with stairs or ramp for entrance;A lot of help with bathing/dressing/bathroom;Assistance with cooking/housework   Equipment Recommendations  None recommended by PT    Recommendations for Other Services       Precautions / Restrictions Precautions Precautions: Fall Restrictions Weight Bearing Restrictions: No     Mobility  Bed Mobility Overal bed mobility: Needs Assistance Bed Mobility: Rolling, Sidelying to Sit Rolling: Min assist, Mod assist Sidelying to sit: Mod assist       General bed mobility comments: slow labored movement with fair carrryover for using bed rail during side lying to sitting    Transfers Overall transfer level: Needs assistance Equipment used: Rolling walker (2 wheels) Transfers: Sit to/from Stand, Bed to chair/wheelchair/BSC Sit to Stand: Min assist, Mod assist   Step pivot transfers: Mod assist       General transfer comment: very unsteady labored movement    Ambulation/Gait Ambulation/Gait assistance: Mod assist, Max assist Gait Distance (Feet): 5 Feet Assistive device: Rolling walker (2 wheels) Gait Pattern/deviations: Decreased step length - right, Decreased step length - left, Decreased stride length Gait velocity: decreased     General Gait Details: limited to a few slow labored unsteady side steps with frequent buckling of knees, limited mostly due to fatigue and fear of falling   Stairs             Wheelchair Mobility    Modified Rankin (Stroke Patients Only)       Balance Overall balance assessment: Needs assistance Sitting-balance support: Feet supported, No upper extremity supported Sitting balance-Leahy Scale:  Poor Sitting balance - Comments: fair/poor seated at EOB Postural control: Posterior lean Standing balance support: Reliant on assistive device for balance, During functional activity,  Bilateral upper extremity supported Standing balance-Leahy Scale: Poor Standing balance comment: fair/poor using RW                            Cognition Arousal/Alertness: Awake/alert Behavior During Therapy: WFL for tasks assessed/performed, Anxious Overall Cognitive Status: Within Functional Limits for tasks assessed                                          Exercises General Exercises - Lower Extremity Ankle Circles/Pumps: Seated, AROM, Strengthening, 10 reps Long Arc Quad: Seated, AAROM, Strengthening, Both, 15 reps Hip Flexion/Marching: Seated, AAROM, Strengthening, Both, 15 reps    General Comments        Pertinent Vitals/Pain Pain Assessment Pain Assessment: No/denies pain    Home Living                          Prior Function            PT Goals (current goals can now be found in the care plan section) Acute Rehab PT Goals Patient Stated Goal: return home PT Goal Formulation: With patient/family Time For Goal Achievement: 06/21/22 Potential to Achieve Goals: Good Progress towards PT goals: Progressing toward goals    Frequency    Min 3X/week      PT Plan Current plan remains appropriate    Co-evaluation              AM-PAC PT "6 Clicks" Mobility   Outcome Measure  Help needed turning from your back to your side while in a flat bed without using bedrails?: A Lot Help needed moving from lying on your back to sitting on the side of a flat bed without using bedrails?: A Lot Help needed moving to and from a bed to a chair (including a wheelchair)?: A Lot Help needed standing up from a chair using your arms (e.g., wheelchair or bedside chair)?: A Lot Help needed to walk in hospital room?: A Lot Help needed climbing 3-5 steps with a railing? : Total 6 Click Score: 11    End of Session   Activity Tolerance: Patient tolerated treatment well;Patient limited by fatigue Patient left: in chair;with call  bell/phone within reach;with family/visitor present Nurse Communication: Mobility status PT Visit Diagnosis: Unsteadiness on feet (R26.81);Other abnormalities of gait and mobility (R26.89);Muscle weakness (generalized) (M62.81)     Time: 0539-7673 PT Time Calculation (min) (ACUTE ONLY): 20 min  Charges:  $Therapeutic Exercise: 8-22 mins $Therapeutic Activity: 8-22 mins                     10:02 AM, 06/16/22 Lonell Grandchild, MPT Physical Therapist with [facility] [phone] office [phone] mobile phone

## 2022-06-16 NOTE — Progress Notes (Signed)
[facility] Progress Note  12 Days Post-Op  Subjective: Resting in bed. Just got done from sitting in the chair. Working with physical therapy.   Objective: Vital signs in last 24 hours: Temp:  [98 F (36.7 C)-98.7 F (37.1 C)] 98.7 F (37.1 C) (07/14 0526) Pulse Rate:  [76-85] 76 (07/14 0526) Resp:  [18-21] 18 (07/14 0526) BP: (140-152)/(76-91) 152/88 (07/14 0526) SpO2:  [93 %-99 %] 93 % (07/14 0526) Last BM Date : 06/14/22  Intake/Output from previous day: 07/13 0701 - 07/14 0700 In: 839.3 [I.V.:839.3] Out: 1430 [Urine:1400; Drains:30] Intake/Output this shift: Total I/O In: 0  Out: 650 [Urine:650]  General appearance: alert and no distress Resp: normal work breathing GI: soft, staples c/d/I with JP purulent drainage  Lab Results:  Recent Labs    06/15/22 0606 06/16/22 0609  WBC 25.9* 24.4*  HGB 11.3* 11.3*  HCT 34.5* 34.5*  PLT 475* 527*   BMET Recent Labs    06/15/22 0606  NA 142  K 4.5  CL 109  CO2 29  GLUCOSE 182*  BUN 34*  CREATININE 1.13*  CALCIUM 10.2   PT/INR No results for input(s): "LABPROT", "INR" in the last 72 hours.  Studies/Results: No results found.  Anti-infectives: Anti-infectives (From admission, onward)    Start     Dose/Rate Route Frequency Ordered Stop   06/16/22 0800  Ampicillin-Sulbactam (UNASYN) 3 g in sodium chloride 0.9 % 100 mL IVPB        3 g 200 mL/hr over 30 Minutes Intravenous Every 6 hours 06/16/22 0756     06/13/22 1400  Ampicillin-Sulbactam (UNASYN) 3 g in sodium chloride 0.9 % 100 mL IVPB  Status:  Discontinued        3 g 200 mL/hr over 30 Minutes Intravenous Every 8 hours 06/13/22 0821 06/13/22 0823   06/13/22 1000  Ampicillin-Sulbactam (UNASYN) 3 g in sodium chloride 0.9 % 100 mL IVPB  Status:  Discontinued        3 g 200 mL/hr over 30 Minutes Intravenous Every 12 hours 06/13/22 0823 06/16/22 0756   06/11/22 2200  piperacillin-tazobactam (ZOSYN) IVPB 3.375 g  Status:  Discontinued         3.375 g 12.5 mL/hr over 240 Minutes Intravenous Every 8 hours 06/11/22 1415 06/13/22 0821   06/11/22 1500  piperacillin-tazobactam (ZOSYN) IVPB 3.375 g        3.375 g 100 mL/hr over 30 Minutes Intravenous  Once 06/11/22 1410 06/12/22 0940   06/07/22 0200  fluconazole (DIFLUCAN) IVPB 200 mg  Status:  Discontinued        200 mg 100 mL/hr over 60 Minutes Intravenous Every 24 hours 06/06/22 0901 06/06/22 1031   06/06/22 1200  micafungin (MYCAMINE) 100 mg in sodium chloride 0.9 % 100 mL IVPB        100 mg 105 mL/hr over 1 Hours Intravenous Every 24 hours 06/06/22 1031 06/20/22 2359   06/05/22 1800  piperacillin-tazobactam (ZOSYN) IVPB 3.375 g        3.375 g 12.5 mL/hr over 240 Minutes Intravenous Every 12 hours 06/05/22 0825 06/09/22 2000   06/04/22 0600  piperacillin-tazobactam (ZOSYN) IVPB 3.375 g  Status:  Discontinued        3.375 g 12.5 mL/hr over 240 Minutes Intravenous Every 8 hours 06/04/22 0219 06/05/22 0825   06/04/22 0315  fluconazole (DIFLUCAN) IVPB 200 mg  Status:  Discontinued        200 mg 100 mL/hr over 60 Minutes Intravenous Every 24 hours 06/04/22 0217  06/04/22 0219   06/04/22 0315  fluconazole (DIFLUCAN) IVPB 200 mg  Status:  Discontinued        200 mg 100 mL/hr over 60 Minutes Intravenous Every 24 hours 06/04/22 0219 06/06/22 0901   06/03/22 2130  ceFEPIme (MAXIPIME) 2 g in sodium chloride 0.9 % 100 mL IVPB        2 g 200 mL/hr over 30 Minutes Intravenous  Once 06/03/22 2127 06/03/22 2209   06/03/22 2130  metroNIDAZOLE (FLAGYL) IVPB 500 mg  Status:  Discontinued        500 mg 100 mL/hr over 60 Minutes Intravenous Every 12 hours 06/03/22 2127 06/04/22 0217       Assessment/Plan: Patient s/p Ex lap, gastrorrhaphy and omental patch.  PRN for pain Delirium precautions  Normal work of breathing NPO, can have sips with meds and small amount of applesauce if needed TPN Repeat CT Monday with oral contrast to assess for leak Leukocytosis coming down, Micofungin for 2  weeks starting 7/4 (Negative blood cultures), and Unasyn added back for leak, will continue until we repeat the CT and assess for further leakage or abscess on CT Labs tomorrow, birlirubin coming down, [place] was relatively normal and reassuring Scds, heparin sq Updated her husband over phone. Dr. Okey Dupre seeing over the weekend.    LOS: 12 days    Virl Cagey 06/16/2022

## 2022-06-16 NOTE — Progress Notes (Signed)
Pharmacy Antibiotic Note  Carolyn Erickson is a 76 y.o. female admitted on 06/03/2022 and found to have gastric perforation with peritonitis. Pharmacy has been consulted to adjust Zosyn to Unasyn.  SCr 1.13, CrCl>30 ml/min - will increase Unasyn dosing. Micafungin continuing for Candida glabrata fungemia.  Plan: - Modify Unasyn 3g IV every 6 hours - Continue Micafungin 100 mg IV every 24 hours - Will continue to follow renal function, culture results, LOT, and antibiotic de-escalation plans   Height: '5\' 1"'$  (154.9 cm) Weight: 54.9 kg (121 lb 0.5 oz) (removed pillows except one/scd machine) IBW/kg (Calculated) : 47.8  Temp (24hrs), Avg:98.4 F (36.9 C), Min:98 F (36.7 C), Max:98.7 F (37.1 C)  Recent Labs  Lab 06/10/22 0549 06/11/22 0707 06/12/22 0514 06/13/22 0531 06/14/22 0608 06/15/22 0606 06/16/22 0609  WBC 25.6* 31.0* 30.3* 28.3* 25.5* 25.9* 24.4*  CREATININE 1.97* 1.66* 1.44* 1.37*  --  1.13*  --      Estimated Creatinine Clearance: 31 mL/min (A) (by C-G formula based on SCr of 1.13 mg/dL (H)).    No Known Allergies  Thank you for allowing pharmacy to be a part of this patient's care.  Thomasenia Sales, PharmD, [facility] Clinical Pharmacist  06/16/2022 7:54 AM   **Pharmacist phone directory can now be found on [email or web address] (PW [email or web address]).  Listed under [place].

## 2022-06-16 NOTE — Progress Notes (Addendum)
Dr. Constance Haw at bedside with this LPN. Pt lying in bed with eyes closed but easy to arouse. JP Drain checked the out put is a brownish green. Will continue to monitor.

## 2022-06-16 NOTE — Progress Notes (Signed)
Pt's husband is leaving bedside. Pt's pit back into bed from chair and bed alarm on. Pt's bed in lowest position with rails up for safety.

## 2022-06-17 DIAGNOSIS — A419 Sepsis, unspecified organism: Secondary | ICD-10-CM | POA: Diagnosis not present

## 2022-06-17 DIAGNOSIS — R6521 Severe sepsis with septic shock: Secondary | ICD-10-CM | POA: Diagnosis not present

## 2022-06-17 LAB — GLUCOSE, CAPILLARY
Glucose-Capillary: 148 mg/dL — ABNORMAL HIGH (ref 70–99)
Glucose-Capillary: 114 mg/dL — ABNORMAL HIGH (ref 70–99)
Glucose-Capillary: 159 mg/dL — ABNORMAL HIGH (ref 70–99)
Glucose-Capillary: 109 mg/dL — ABNORMAL HIGH (ref 70–99)

## 2022-06-17 LAB — CBC
HCT: 35.7 % — ABNORMAL LOW (ref 36.0–46.0)
Hemoglobin: 11.4 g/dL — ABNORMAL LOW (ref 12.0–15.0)
MCH: 29.7 pg (ref 26.0–34.0)
MCHC: 31.9 g/dL (ref 30.0–36.0)
MCV: 93 fL (ref 80.0–100.0)
Platelets: 628 10*3/uL — ABNORMAL HIGH (ref 150–400)
RBC: 3.84 MIL/uL — ABNORMAL LOW (ref 3.87–5.11)
RDW: 15.3 % (ref 11.5–15.5)
WBC: 21.3 10*3/uL — ABNORMAL HIGH (ref 4.0–10.5)
nRBC: 0 % (ref 0.0–0.2)

## 2022-06-17 LAB — COMPREHENSIVE METABOLIC PANEL
ALT: 41 U/L (ref 0–44)
AST: 54 U/L — ABNORMAL HIGH (ref 15–41)
Albumin: 1.7 g/dL — ABNORMAL LOW (ref 3.5–5.0)
Alkaline Phosphatase: 181 U/L — ABNORMAL HIGH (ref 38–126)
Anion gap: 5 (ref 5–15)
BUN: 40 mg/dL — ABNORMAL HIGH (ref 8–23)
CO2: 29 mmol/L (ref 22–32)
Calcium: 10.7 mg/dL — ABNORMAL HIGH (ref 8.9–10.3)
Chloride: 111 mmol/L (ref 98–111)
Creatinine, Ser: 1.23 mg/dL — ABNORMAL HIGH (ref 0.44–1.00)
GFR, Estimated: 45 mL/min — ABNORMAL LOW (ref >60)
Glucose, Bld: 146 mg/dL — ABNORMAL HIGH (ref 70–99)
Potassium: 4.8 mmol/L (ref 3.5–5.1)
Sodium: 145 mmol/L (ref 135–145)
Total Bilirubin: 4.4 mg/dL — ABNORMAL HIGH (ref 0.3–1.2)
Total Protein: 6.2 g/dL — ABNORMAL LOW (ref 6.5–8.1)

## 2022-06-17 MED ORDER — TRAVASOL 10 % IV SOLN
INTRAVENOUS | Status: AC
  Filled 2022-06-17: qty 772.8

## 2022-06-17 NOTE — Progress Notes (Signed)
PROGRESS NOTE  BRONWEN Erickson GXQ:[id number] DOB: 1946-02-12 DOA: 06/03/2022 PCP: Dettinger, Fransisca Kaufmann, MD  Brief History:  76 year old female with history of major neurocognitive disorder, hypertension, hyperlipidemia, depression, left breast cancer presenting with generalized weakness and abdominal pain.  Apparently, the patient has been having abdominal pain since April 2023.  The patient had been started on pantoprazole at that time.  There was no improvement.  She followed up with her PCP on 06/02/2022.  Referral was given to see gastroenterology.  Abdominal pain had worsened over the past 2 days prior to admission with worsening generalized weakness.  There is no fevers, chills, chest pain, shortness breath, vomiting, diarrhea.  There is no hematochezia or melena noted.  In the emergency department, the patient was hypotensive with a blood pressure of 71/54.  She was somnolent.  CT of the abdomen and pelvis was performed and showed changes consistent with a perforated gastric ulcer.  Dr. Constance Haw was consulted, and the patient was taken to the OR for exploratory laparotomy, gastrorrhaphy, omental patch.  A left IJ central line was also placed.  TRH is consulted for medical management  In the ED, the patient had low-grade fever 99.5 F.  She was tachycardic and hypotensive with blood pressure 68/53.  WBC 4.1, hemoglobin 14.6, platelets 308,000.  BMP showed sodium 137, potassium 4.1, bicarbonate 22, serum creatinine 2.14.  Postoperatively, the patient was started on Zosyn and fluconazole.  She remained on the ventilator.  Preoperative chest x-ray showed bibasilar atelectasis.  There was free air under the diaphragm.   Assessment and Plan: * Septic shock - RESOLVED Presented with hypotension, tachycardia, and lactic acid peaked at 6.8 Pt successfully weaned off norepinephrine infusion Secondary to gastric perforation with peritonitis - Patient s/p Ex lap, gastrorrhaphy and omental patch.   06/03/22 blood cultures--C. glabrata Continue IV Unasyn per ID  discontinued fluconazole per ID,  -continue echinocandin (micafungin) -Leukocytosis persist, trending down very slowly -Urine  culture NGTD -Repeat CT abdomen and pelvis planned for Monday, 06/19/2022 to look for possible leak  Gastric perforation ([place]) Suspect this may be due to chronic NSAID use -Continue PPI twice daily -06/04/2022--ex laparotomy with omental patch -Postoperative care per Dr. Constance Haw -TNA therapy on hold temporarily due to need for CVL holiday -UGI 7/6 - no leak seen.  7/9 - CT c/w leak present - PICC line placed 7/10 and TPN started 7/10 - surgery planning to repeat CT on Monday 06/19/22  Fungemia 06/03/22 blood cultures--Candida glabrata -d/c fluconazole per ID consult -continue echinocandin per ID  -discussed with general surgery>>removed central line 7/4 for line holiday -repeat blood culture on 06/06/22 NGTD  Acute renal failure superimposed on stage 3a chronic kidney disease (HCC) -Serum creatinine peaked at 2.28 but trending down  -Secondary to hemodynamic changes/sepsis and hypovolemia -Creatinine much improved -renally adjust medications, avoid nephrotoxic agents / dehydration  / hypotension  Leukocytosis -- WBC peaked at 31 K -Leukocytosis persist, trending down very slowly -- appreciate ID and surgery recommendations,  -continue antifungal and Unasyn  Hyperbilirubinemia -- secondary to persistent leak.  Continuing to monitor.   Volume overload -- treated with IV lasix 40 mg x 2 on 7/7 and resolved  Hypomagnesemia IV replacement ordered and repleted, continue monitoring   Acute respiratory failure with hypoxia (HCC) Currently PRVC, RR 20, Vt 380, FiO2 0.4 -7/3 personally reviewed CXR--increased interstitial marking -7/3 ABG--7.35/36/107/20 (0.4) -7/3--extubated -7/4-5--stable on 4L [place] -7/6 - down to 2L/min [place] -Currently  remains on 2 L of nasal cannula  Major neurocognitive  disorder [facility]) Patient had MoCa 17/30 on 03/14/22 -resumed donezepil, venlafaxine, bupropion 06/12/22  GERD (gastroesophageal reflux disease) Continue pantoprazole IV twice daily  Essential hypertension  amlodipine 2.5 mg daily  Mixed hyperlipidemia -- resumed home atorvastatin   Family Communication:   Discussed with husband at bedside  consultants:  general surgery  Code Status:  FULL   DVT Prophylaxis:  SCDs  Procedures: As Listed in Progress Note Above  Antibiotics: Fluconazole 7/2>>7/4 Micafungin 7/4>> Zosyn 7/2>>7/11 Unasyn 7/11>>  Subjective:  06/17/22 -husband at bedside, questions answered -Husband acutely verbalizes previously discussed plan of care -Patient tells me she has been married to her husband for about 60 years now  Objective: Vitals:   06/16/22 1259 06/16/22 2127 06/17/22 0529 06/17/22 0631  BP: (!) 152/76 (!) 165/70 (!) 147/72   Pulse: 73 79 74   Resp: '16 19 19   '$ Temp: 98 F (36.7 C) (!) 97.5 F (36.4 C) 98.1 F (36.7 C)   TempSrc: Oral     SpO2: 96% (!) 87% 95%   Weight:    54.9 kg  Height:        Intake/Output Summary (Last 24 hours) at 06/17/2022 1156 Last data filed at 06/17/2022 0900 Gross per 24 hour  Intake 1552.29 ml  Output 3840 ml  Net -2287.71 ml   Physical Exam  Gen:- Awake Alert, in no acute distress  HEENT:- [place].AT, No sclera icterus Ears-HOH Neck-Supple Neck,No JVD,.  Lungs-   fair air movement bilaterally , no wheezing CV- S1, S2 normal, RRR Abd-  +ve B.Sounds, Abd Soft, mild abdominal discomfort with palpation, JP drain with dark brownish fluid Extremity/Skin:- good pedal pulses  Psych-affect is appropriate, oriented x3 Neuro-generalized weakness, no new focal deficits, no tremors MSK-right arm PICC line   Data Reviewed: I have personally reviewed following labs and imaging studies Basic Metabolic Panel: Recent Labs  Lab 06/11/22 0707 06/12/22 0514 06/13/22 0531 06/15/22 0606 06/17/22 0546  NA 145 142  141 142 145  K 3.3* 3.0* 3.5 4.5 4.8  CL 107 107 107 109 111  CO2 '30 31 29 29 29  '$ GLUCOSE 118* 129* 174* 182* 146*  BUN 51* 40* 37* 34* 40*  CREATININE 1.66* 1.44* 1.37* 1.13* 1.23*  CALCIUM 9.6 9.2 9.2 10.2 10.7*  MG  --  1.6* 2.3 1.8  --   PHOS  --  3.5 3.5 2.7  --    Liver Function Tests: Recent Labs  Lab 06/11/22 0707 06/12/22 0514 06/13/22 0531 06/15/22 0606 06/17/22 0546  AST '30 27 27 '$ 35 54*  ALT '18 18 18 25 '$ 41  ALKPHOS 122 99 92 101 181*  BILITOT 4.8* 4.6* 4.6* 3.9* 4.4*  PROT 5.6* 4.7* 4.7* 5.3* 6.2*  ALBUMIN 1.8* <1.5* <1.5* 1.6* 1.7*   No results for input(s): "LIPASE", "AMYLASE" in the last 168 hours.  No results for input(s): "AMMONIA" in the last 168 hours. Coagulation Profile: No results for input(s): "INR", "PROTIME" in the last 168 hours.  CBC: Recent Labs  Lab 06/12/22 0514 06/13/22 0531 06/14/22 0608 06/15/22 0606 06/16/22 0609 06/17/22 0546  WBC 30.3* 28.3* 25.5* 25.9* 24.4* 21.3*  NEUTROABS 26.2* 24.7* 22.4* 21.8* 20.1*  --   HGB 11.4* 11.4* 11.9* 11.3* 11.3* 11.4*  HCT 35.9* 35.4* 36.3 34.5* 34.5* 35.7*  MCV 92.3 92.4 92.4 92.7 93.0 93.0  PLT 247 304 354 475* 527* 628*   Cardiac Enzymes: No results for input(s): "CKTOTAL", "CKMB", "CKMBINDEX", "TROPONINI" in  the last 168 hours. BNP: Invalid input(s): "POCBNP" CBG: Recent Labs  Lab 06/16/22 1619 06/16/22 2129 06/17/22 0107 06/17/22 0543 06/17/22 1132  GLUCAP 140* 132* 148* 114* 159*   HbA1C: No results for input(s): "HGBA1C" in the last 72 hours. Urine analysis:    Component Value Date/Time   COLORURINE AMBER (A) 06/04/2022 0520   APPEARANCEUR CLOUDY (A) 06/04/2022 0520   APPEARANCEUR Cloudy (A) 04/13/2021 1530   LABSPEC 1.027 06/04/2022 0520   PHURINE 5.0 06/04/2022 0520   GLUCOSEU NEGATIVE 06/04/2022 0520   HGBUR NEGATIVE 06/04/2022 0520   BILIRUBINUR NEGATIVE 06/04/2022 0520   BILIRUBINUR Negative 04/13/2021 1530   KETONESUR NEGATIVE 06/04/2022 0520   PROTEINUR 100  (A) 06/04/2022 0520   NITRITE NEGATIVE 06/04/2022 0520   LEUKOCYTESUR SMALL (A) 06/04/2022 0520    No results found for this or any previous visit (from the past 240 hour(s)).    Scheduled Meds:  amLODipine  2.5 mg Oral Daily   atorvastatin  20 mg Oral QPM   buPROPion  150 mg Oral BID   Chlorhexidine Gluconate Cloth  6 each Topical Daily   donepezil  10 mg Oral QHS   heparin  5,000 Units Subcutaneous Q8H   insulin aspart  0-9 Units Subcutaneous Q6H   mouth rinse  15 mL Mouth Rinse Q4H   pantoprazole (PROTONIX) IV  40 mg Intravenous Q12H   sodium chloride flush  10-40 mL Intracatheter Q12H   venlafaxine  37.5 mg Oral BID   Continuous Infusions:  ampicillin-sulbactam (UNASYN) IV 3 g (06/17/22 0854)   micafungin (MYCAMINE) 100 mg in sodium chloride 0.9 % 100 mL IVPB Stopped (06/16/22 1609)   TPN ADULT (ION) 70 mL/hr at 06/17/22 0109   TPN ADULT (ION)      Procedures/Studies: US Abdomen Limited RUQ (LIVER/GB)  Result Date: 06/12/2022 CLINICAL DATA:  Hyperbilirubinemia EXAM: ULTRASOUND ABDOMEN LIMITED RIGHT UPPER QUADRANT COMPARISON:  CT abdomen and pelvis 06/11/2022 FINDINGS: Gallbladder: Sludge within gallbladder. No definite shadowing calculi. Mild gallbladder wall thickening. No sonographic Murphy sign. Common bile duct: Diameter: 3 mm, normal Liver: Normal echogenicity without mass or nodularity. No intrahepatic biliary dilatation. Portal vein is patent on color Doppler imaging with normal direction of blood flow towards the liver. Other: Perihepatic ascites identified, containing septations/loculations. IMPRESSION: Sludge within gallbladder with mild gallbladder wall thickening, a nonspecific finding in the setting of ascites. No biliary dilatation or focal hepatic abnormality. Electronically Signed   By: Lavonia Dana M.D.   On: 06/12/2022 14:40   [place] EKG SITE RITE  Result Date: 06/11/2022 If Site Rite image not attached, placement could not be confirmed due to current cardiac  rhythm.  CT ABDOMEN PELVIS WO CONTRAST  Result Date: 06/11/2022 CLINICAL DATA:  EXPLORATORY LAPAROTOMY, omental patchImpression: Worsening leukocytosis. EXAM: CT ABDOMEN AND PELVIS WITHOUT CONTRAST TECHNIQUE: Multidetector CT imaging of the abdomen and pelvis was performed following the standard protocol without IV contrast. RADIATION DOSE REDUCTION: This exam was performed according to the departmental dose-optimization program which includes automated exposure control, adjustment of the mA and/or kV according to patient size and/or use of iterative reconstruction technique. COMPARISON:  06/03/2022. FINDINGS: Lower chest: Moderate pleural effusions. Dependent lower lobe and right middle lobe opacity, likely atelectasis. Additional ground-glass opacities are noted, most evident in the right middle lobe, suspicious for infection. Effusions are increased on the left and new on the right since the prior CT. Lung base opacities are mostly new. Hepatobiliary: No focal liver abnormality is seen. No gallstones, gallbladder wall thickening, or biliary  dilatation. Pancreas: Unremarkable. No pancreatic ductal dilatation or surrounding inflammatory changes. Spleen: Normal in size without focal abnormality. Adrenals/Urinary Tract: No adrenal masses. No renal masses or hydronephrosis. Bladder is unremarkable. Stomach/Bowel: Stomach is mostly decompressed. There is an apparent defect along the anterior aspect of the mid to distal stomach through which contrast and air has collected. The ill-defined collection of contrast and air lies in the mid to upper abdomen near the midline, with an adjacent surgical drain, and deep to the midline laparotomy incision. Poorly defined small bowel loops in the central abdomen. There is no colon or small bowel dilation to suggest obstruction. No colonic wall thickening or convincing inflammation. Vascular/Lymphatic: Aortic atherosclerosis. Prominent lymph nodes noted along the gastrohepatic  ligament. There are no enlarged lymph nodes. Reproductive: Status post hysterectomy. No adnexal masses. Other: Small amount of ascites. Generalized increased attenuation of the mesenteric/peritoneal fat consistent with edema. Diffuse subcutaneous soft tissue edema. Musculoskeletal: No acute finding. IMPRESSION: 1. There is no defined fluid collection to indicate an abscess. 2. There is extraluminal air and contrast in the anterior central to upper abdomen, that appears to be arising from a defect along the anterior gastric wall. The extraluminal contrast and air lies adjacent to the surgical drain. 3. Small amount of ascites is similar to the previous CT. 4. Mesenteric/peritoneal and diffuse subcutaneous edema. 5. Moderate pleural effusions with associated lung base atelectasis. Additional areas of ground-glass lung opacity are suspicious for multifocal pneumonia. Electronically Signed   By: Lajean Manes M.D.   On: 06/11/2022 12:56   DG CHEST PORT 1 VIEW  Result Date: 06/09/2022 CLINICAL DATA:  Weakness, wheezing EXAM: PORTABLE CHEST 1 VIEW COMPARISON:  06/05/2022 FINDINGS: Interval endotracheal and esophagogastric extubation. Small to moderate bilateral pleural effusions, increased compared to prior examination, with associated atelectasis or consolidation and diffuse bilateral interstitial pulmonary opacity. Cardiomegaly. IMPRESSION: 1. Small to moderate bilateral pleural effusions, increased compared to prior examination, with associated atelectasis or consolidation and diffuse bilateral interstitial pulmonary opacity. Findings are most consistent with worsened edema. 2. Interval endotracheal and esophagogastric extubation. 3. Cardiomegaly. Electronically Signed   By: Delanna Ahmadi M.D.   On: 06/09/2022 12:25   DG UGI W SMALL BOWEL  Result Date: 06/08/2022 CLINICAL DATA:  Post repair of 1.5 cm diameter perforated anterior wall gastric antral ulcer EXAM: WATER SOLUBLE UPPER GI SERIES TECHNIQUE:  Single-column upper GI series was performed using water soluble contrast. Radiation Exposure Index (as provided by the fluoroscopic device): 43.1 mGy Kerma CONTRAST:  100 cc Omnipaque 300 via NG tube COMPARISON:  CT abdomen and pelvis 06/03/2022 FLUOROSCOPY: Fluoroscopy Time:  2 minutes 36 seconds Radiation Exposure Index (if provided by the fluoroscopic device): 43.1 Number of Acquired Spot Images: Multiple fluoroscopic screen captures and cine series FINDINGS: Contrast administered via NG tube opacifies the gastric lumen. Patent pylorus with passage of contrast through normal appearing duodenal C loop into proximal jejunum with incidentally noted duodenal diverticulum at second portion. Irregularity of rugal folds with thickening and distortion at anterior wall of gastric antrum at site of perforation repair. The third portion of the duodenum is superimposed with the inferior wall of the gastric antrum, mildly limiting exam. A blush of contrast is seen inferior to the antrum, superimposed with the third portion of the duodenum throughout the study. This appears to move in concert with the duodenum with respiration. No contrast opacification of the JP drain. No gross evidence of contrast leak identified though it is difficult to completely exclude a small leak at  the site of repair. IMPRESSION: Distortion and thickening of rugal folds at site of antral gastric ulcer repair. No gross evidence of leak at the site of ulcer repair as discussed above. Electronically Signed   By: Lavonia Dana M.D.   On: 06/08/2022 15:32   ECHOCARDIOGRAM COMPLETE  Result Date: 06/07/2022    ECHOCARDIOGRAM REPORT   Patient Name:   Carolyn Erickson Date of Exam: 06/07/2022 Medical Rec #:  [id number]       Height:       61.0 in Accession #:    [id number]      Weight:       112.2 lb Date of Birth:  1946-12-02        BSA:          1.477 m Patient Age:    89 years        BP:           116/46 mmHg Patient Gender: F               HR:           104  bpm. Exam Location:  [facility] Procedure: 2D Echo, Cardiac Doppler and Color Doppler Indications:    Bacteremia  History:        Patient has no prior history of Echocardiogram examinations.                 Signs/Symptoms:Bacteremia; Risk Factors:Hypertension and                 Dyslipidemia. Breast CA.  Sonographer:    Wenda Low Referring Phys: [id number] [place]  1. Abnormal septal motion . Left ventricular ejection fraction, by estimation, is 55 to 60%. The left ventricle has normal function. The left ventricle has no regional wall motion abnormalities. Left ventricular diastolic parameters were normal.  2. Right ventricular systolic function is normal. The right ventricular size is normal. There is mildly elevated pulmonary artery systolic pressure.  3. Moderate pleural effusion in the left lateral region.  4. The mitral valve is abnormal. Trivial mitral valve regurgitation. No evidence of mitral stenosis.  5. Thickening and calcification noted . The tricuspid valve is abnormal.  6. Some thickening in the LVOT near intervalvular fibrosa Given this and nodular calcification of PV suggest TEE if suspicion for SBE high. The aortic valve is tricuspid. There is moderate calcification of the aortic valve. There is moderate thickening of the aortic valve. Aortic valve regurgitation is mild. Aortic valve sclerosis/calcification is present, without any evidence of aortic stenosis.  7. Nodular calcification seen on PV. The pulmonic valve was abnormal.  8. The inferior vena cava is normal in size with greater than 50% respiratory variability, suggesting right atrial pressure of 3 mmHg. FINDINGS  Left Ventricle: Abnormal septal motion. Left ventricular ejection fraction, by estimation, is 55 to 60%. The left ventricle has normal function. The left ventricle has no regional wall motion abnormalities. The left ventricular internal cavity size was normal in size. There is no left ventricular hypertrophy.  Left ventricular diastolic parameters were normal. Right Ventricle: The right ventricular size is normal. No increase in right ventricular wall thickness. Right ventricular systolic function is normal. There is mildly elevated pulmonary artery systolic pressure. The tricuspid regurgitant velocity is 3.21  m/s, and with an assumed right atrial pressure of 3 mmHg, the estimated right ventricular systolic pressure is 29.9 mmHg. Left Atrium: Left atrial size was normal in size. Right Atrium: Right atrial size  was normal in size. Pericardium: There is no evidence of pericardial effusion. Mitral Valve: The mitral valve is abnormal. There is mild thickening of the mitral valve leaflet(s). There is mild calcification of the mitral valve leaflet(s). Mild mitral annular calcification. Trivial mitral valve regurgitation. No evidence of mitral valve stenosis. MV peak gradient, 3.9 mmHg. The mean mitral valve gradient is 1.0 mmHg. Tricuspid Valve: Thickening and calcification noted. The tricuspid valve is abnormal. Tricuspid valve regurgitation is trivial. No evidence of tricuspid stenosis. Aortic Valve: Some thickening in the LVOT near intervalvular fibrosa Given this and nodular calcification of PV suggest TEE if suspicion for SBE high. The aortic valve is tricuspid. There is moderate calcification of the aortic valve. There is moderate thickening of the aortic valve. Aortic valve regurgitation is mild. Aortic valve sclerosis/calcification is present, without any evidence of aortic stenosis. Aortic valve mean gradient measures 5.0 mmHg. Aortic valve peak gradient measures 10.3 mmHg. Aortic valve area, by VTI measures 2.02 cm. Pulmonic Valve: Nodular calcification seen on PV. The pulmonic valve was abnormal. Pulmonic valve regurgitation is trivial. No evidence of pulmonic stenosis. Aorta: The aortic root is normal in size and structure. Venous: The inferior vena cava is normal in size with greater than 50% respiratory  variability, suggesting right atrial pressure of 3 mmHg. IAS/Shunts: No atrial level shunt detected by color flow Doppler. Additional Comments: There is a moderate pleural effusion in the left lateral region.  LEFT VENTRICLE PLAX 2D LVIDd:         4.50 cm     Diastology LVIDs:         3.00 cm     LV e' medial:    7.29 cm/s LV PW:         0.80 cm     LV E/e' medial:  11.8 LV IVS:        0.80 cm     LV e' lateral:   9.79 cm/s LVOT diam:     1.80 cm     LV E/e' lateral: 8.8 LV SV:         55 LV SV Index:   37 LVOT Area:     2.54 cm  LV Volumes (MOD) LV vol d, MOD A2C: 43.7 ml LV vol d, MOD A4C: 52.7 ml LV vol s, MOD A2C: 17.3 ml LV vol s, MOD A4C: 23.8 ml LV SV MOD A2C:     26.4 ml LV SV MOD A4C:     52.7 ml LV SV MOD BP:      28.6 ml RIGHT VENTRICLE RV Basal diam:  3.10 cm RV Mid diam:    2.90 cm RV S prime:     14.50 cm/s TAPSE (M-mode): 3.0 cm LEFT ATRIUM             Index        RIGHT ATRIUM           Index LA diam:        3.60 cm 2.44 cm/m   RA Area:     11.50 cm LA Vol (A2C):   50.9 ml 34.45 ml/m  RA Volume:   21.80 ml  14.76 ml/m LA Vol (A4C):   43.7 ml 29.58 ml/m LA Biplane Vol: 49.8 ml 33.71 ml/m  AORTIC VALVE                     PULMONIC VALVE AV Area (Vmax):    1.92 cm      PV Vmax:  0.98 m/s AV Area (Vmean):   1.84 cm      PV Peak grad:  3.9 mmHg AV Area (VTI):     2.02 cm AV Vmax:           160.50 cm/s AV Vmean:          103.550 cm/s AV VTI:            0.274 m AV Peak Grad:      10.3 mmHg AV Mean Grad:      5.0 mmHg LVOT Vmax:         121.00 cm/s LVOT Vmean:        74.800 cm/s LVOT VTI:          0.218 m LVOT/AV VTI ratio: 0.79  AORTA Ao Root diam: 3.30 cm MITRAL VALVE               TRICUSPID VALVE MV Area (PHT): 6.02 cm    TR Peak grad:   41.2 mmHg MV Area VTI:   2.25 cm    TR Vmax:        321.00 cm/s MV Peak grad:  3.9 mmHg MV Mean grad:  1.0 mmHg    SHUNTS MV Vmax:       0.99 m/s    Systemic VTI:  0.22 m MV Vmean:      48.8 cm/s   Systemic Diam: 1.80 cm MV Decel Time: 126 msec MV E  velocity: 86.00 cm/s MV A velocity: 72.70 cm/s MV E/A ratio:  1.18 Jenkins Rouge MD Electronically signed by Jenkins Rouge MD Signature Date/Time: 06/07/2022/10:28:36 AM    Final    DG CHEST PORT 1 VIEW  Result Date: 06/05/2022 CLINICAL DATA:  76 year old female with respiratory failure. Intubated. EXAM: PORTABLE CHEST 1 VIEW COMPARISON:  Portable chest 06/04/2022 and earlier. FINDINGS: Portable AP semi upright view at 0527 hours. Endotracheal tube tip in good position between the clavicles and carina. Stable left IJ central line and visible enteric tube. Increased left lung base and retrocardiac opacity now mostly obscuring the left hemidiaphragm. Slightly lower lung volumes overall. Mediastinal contours remain normal. No pneumothorax, pulmonary edema, or definite effusion. Negative right lung. Partially visible midline abdominal skin staples. Paucity of bowel gas in the upper abdomen. Stable left chest and axillary surgical clips. IMPRESSION: 1. Stable lines and tubes. 2. Increased left lower lobe collapse or consolidation since yesterday. Electronically Signed   By: Genevie Ann M.D.   On: 06/05/2022 08:26   DG Chest Port 1 View  Result Date: 06/04/2022 CLINICAL DATA:  Endotracheal tube, nasogastric tube and central line placement. EXAM: PORTABLE CHEST 1 VIEW COMPARISON:  June 03, 2022 FINDINGS: Since the prior study there has been interval placement of an endotracheal tube. Its distal tip is seen approximately 3.8 cm from the carina. Interval nasogastric tube placement is also seen. Its distal and extends into the gastric lumen. There is a left internal jugular venous catheter with its distal tip overlying the distal superior vena cava. This is approximately 6 mm proximal to the junction of the superior vena cava and right atrium. The heart size and mediastinal contours are within normal limits. Mild to moderate severity atelectasis and/or infiltrate is seen within the left lung base. This represents a new finding  when compared to the prior study. There is no evidence of a pleural effusion or pneumothorax. Radiopaque surgical clips are seen along the left axilla. The air seen below the right hemidiaphragm on the prior study is no longer  visualized. The visualized skeletal structures are unremarkable. IMPRESSION: 1. Interval endotracheal tube, nasogastric tube and left internal jugular venous catheter placement positioning, as described above. 2. Mild to moderate severity left basilar atelectasis and/or infiltrate. Electronically Signed   By: Virgina Norfolk M.D.   On: 06/04/2022 02:47   CT Abdomen Pelvis Wo Contrast  Result Date: 06/03/2022 CLINICAL DATA:  Severe weakness for 2 days with acute abdominal pain, initial encounter EXAM: CT ABDOMEN AND PELVIS WITHOUT CONTRAST TECHNIQUE: Multidetector CT imaging of the abdomen and pelvis was performed following the standard protocol without IV contrast. RADIATION DOSE REDUCTION: This exam was performed according to the departmental dose-optimization program which includes automated exposure control, adjustment of the mA and/or kV according to patient size and/or use of iterative reconstruction technique. COMPARISON:  None Available. FINDINGS: Lower chest: Basilar atelectatic changes are noted. Hepatobiliary: Liver is within normal limits. Gallbladder demonstrates a normal appearance. Pancreas: Unremarkable. No pancreatic ductal dilatation or surrounding inflammatory changes. Spleen: Normal in size without focal abnormality. Adrenals/Urinary Tract: Adrenal glands are within normal limits. Kidneys demonstrate tiny nonobstructing stones on right in the lower pole. No ureteral obstruction is seen. The bladder is decompressed. Stomach/Bowel: Considerable fecal material is noted within the rectal vault which may represent a focal impaction. Diverticular change of the colon is noted as well as some generalized wall thickening throughout the colon. The appendix is within normal  limits. Small bowel is within normal limits. Stomach demonstrates a defect in the anterior gastric wall consistent with a perforated ulcer. There is a considerable amount of air and fluid throughout the abdomen with apparent direct communication with the gastric best seen on image number 36 of series 2 and image number 78 of series 6. Vascular/Lymphatic: Aortic atherosclerosis. No enlarged abdominal or pelvic lymph nodes. Reproductive: Status post hysterectomy. No adnexal masses. Other: Free air and free fluid similar to that described above throughout the abdomen. The bowel demonstrates some reactive wall thickening related to the fluid and air. Musculoskeletal: Degenerative changes of the lumbar spine are seen. No other bony abnormality is noted. IMPRESSION: Changes consistent with a perforated gastric ulcer anteriorly with considerable spillage of fluid and air into the abdominal cavity. Some reactive wall thickening is noted within the bowel loops secondary to these changes. Critical Value/emergent results were called by telephone at the time of interpretation on 06/03/2022 at 10:43 pm to Dr. Aletta Edouard , who verbally acknowledged these results. Electronically Signed   By: Inez Catalina M.D.   On: 06/03/2022 22:45   DG Chest Port 1 View  Result Date: 06/03/2022 CLINICAL DATA:  Weakness. EXAM: PORTABLE CHEST 1 VIEW COMPARISON:  None Available. FINDINGS: Multiple overlying radiopaque cardiac lead wires are seen. The heart size and mediastinal contours are within normal limits. Mild, diffuse, chronic appearing increased lung markings are seen. There is no evidence of an acute infiltrate, pleural effusion or pneumothorax. Radiopaque surgical clips are seen along the lateral aspect of the left chest wall. A crescentic area of air is seen just below the right hemidiaphragm. Degenerative changes seen throughout the thoracic spine. IMPRESSION: 1. Chronic appearing increased lung markings without evidence of acute or  active cardiopulmonary disease. 2. Air seen just below the right hemidiaphragm which may represent an air filled loop of colon. Further evaluation with abdomen pelvis CT is recommended, as the presence of intra-abdominal free air cannot be excluded. Electronically Signed   By: Virgina Norfolk M.D.   On: 06/03/2022 21:22     Roxan Hockey, MD How  to contact the [facility] Attending or Consulting provider [place] or covering provider during after hours [place], for this patient?  Check the care team in [facility] and look for a) attending/consulting [facility] provider listed and b) the [facility] team listed Log into [email or web address] and use [place]'s universal password to access. If you do not have the password, please contact the hospital operator. Locate the [facility] provider you are looking for under [facility] and page to a number that you can be directly reached. If you still have difficulty reaching the provider, please page the [facility] (Director on Call) for the Hospitalists listed on amion for assistance.   [facility]  If 7PM-7AM, please contact night-coverage [email or web address] Password [email or web address] 06/17/2022, 11:56 AM   LOS: 13 days

## 2022-06-17 NOTE — Progress Notes (Signed)
[facility] Progress Note  13 Days Post-Op  Subjective: Patient seen and examined.  She is resting in bed.  She denies any abdominal pain, nausea, and vomiting.  She was just washed out by the nurses per her husband.  Objective: Vital signs in last 24 hours: Temp:  [97.5 F (36.4 C)-98.1 F (36.7 C)] 98.1 F (36.7 C) (07/15 0529) Pulse Rate:  [73-79] 74 (07/15 0529) Resp:  [16-19] 19 (07/15 0529) BP: (147-165)/(70-76) 147/72 (07/15 0529) SpO2:  [87 %-96 %] 95 % (07/15 0529) Weight:  [54.9 kg] 54.9 kg (07/15 0631) Last BM Date : 06/14/22  Intake/Output from previous day: 07/14 0701 - 07/15 0700 In: 1552.3 [I.V.:1347.3; IV Piggyback:205] Out: 3490 [Urine:3450; Drains:40] Intake/Output this shift: Total I/O In: -  Out: 1000 [Urine:1000]  General appearance: alert and no distress Resp: Normal work of breathing GI: Abdomen soft, nondistended, no percussion tenderness, minimal epigastric tenderness to palpation, midline dressing in place without strikethrough, JP drain with purulent output-40 cc in the last 24 hours  Lab Results:  Recent Labs    06/16/22 0609 06/17/22 0546  WBC 24.4* 21.3*  HGB 11.3* 11.4*  HCT 34.5* 35.7*  PLT 527* 628*   BMET Recent Labs    06/15/22 0606 06/17/22 0546  NA 142 145  K 4.5 4.8  CL 109 111  CO2 29 29  GLUCOSE 182* 146*  BUN 34* 40*  CREATININE 1.13* 1.23*  CALCIUM 10.2 10.7*   PT/INR No results for input(s): "LABPROT", "INR" in the last 72 hours.  Studies/Results: No results found.  Anti-infectives: Anti-infectives (From admission, onward)    Start     Dose/Rate Route Frequency Ordered Stop   06/16/22 0800  Ampicillin-Sulbactam (UNASYN) 3 g in sodium chloride 0.9 % 100 mL IVPB        3 g 200 mL/hr over 30 Minutes Intravenous Every 6 hours 06/16/22 0756     06/13/22 1400  Ampicillin-Sulbactam (UNASYN) 3 g in sodium chloride 0.9 % 100 mL IVPB  Status:  Discontinued        3 g 200 mL/hr over 30 Minutes  Intravenous Every 8 hours 06/13/22 0821 06/13/22 0823   06/13/22 1000  Ampicillin-Sulbactam (UNASYN) 3 g in sodium chloride 0.9 % 100 mL IVPB  Status:  Discontinued        3 g 200 mL/hr over 30 Minutes Intravenous Every 12 hours 06/13/22 0823 06/16/22 0756   06/11/22 2200  piperacillin-tazobactam (ZOSYN) IVPB 3.375 g  Status:  Discontinued        3.375 g 12.5 mL/hr over 240 Minutes Intravenous Every 8 hours 06/11/22 1415 06/13/22 0821   06/11/22 1500  piperacillin-tazobactam (ZOSYN) IVPB 3.375 g        3.375 g 100 mL/hr over 30 Minutes Intravenous  Once 06/11/22 1410 06/12/22 0940   06/07/22 0200  fluconazole (DIFLUCAN) IVPB 200 mg  Status:  Discontinued        200 mg 100 mL/hr over 60 Minutes Intravenous Every 24 hours 06/06/22 0901 06/06/22 1031   06/06/22 1200  micafungin (MYCAMINE) 100 mg in sodium chloride 0.9 % 100 mL IVPB        100 mg 105 mL/hr over 1 Hours Intravenous Every 24 hours 06/06/22 1031 06/20/22 2359   06/05/22 1800  piperacillin-tazobactam (ZOSYN) IVPB 3.375 g        3.375 g 12.5 mL/hr over 240 Minutes Intravenous Every 12 hours 06/05/22 0825 06/09/22 2000   06/04/22 0600  piperacillin-tazobactam (ZOSYN) IVPB 3.375 g  Status:  Discontinued        3.375 g 12.5 mL/hr over 240 Minutes Intravenous Every 8 hours 06/04/22 0219 06/05/22 0825   06/04/22 0315  fluconazole (DIFLUCAN) IVPB 200 mg  Status:  Discontinued        200 mg 100 mL/hr over 60 Minutes Intravenous Every 24 hours 06/04/22 0217 06/04/22 0219   06/04/22 0315  fluconazole (DIFLUCAN) IVPB 200 mg  Status:  Discontinued        200 mg 100 mL/hr over 60 Minutes Intravenous Every 24 hours 06/04/22 0219 06/06/22 0901   06/03/22 2130  ceFEPIme (MAXIPIME) 2 g in sodium chloride 0.9 % 100 mL IVPB        2 g 200 mL/hr over 30 Minutes Intravenous  Once 06/03/22 2127 06/03/22 2209   06/03/22 2130  metroNIDAZOLE (FLAGYL) IVPB 500 mg  Status:  Discontinued        500 mg 100 mL/hr over 60 Minutes Intravenous Every 12  hours 06/03/22 2127 06/04/22 0217       Assessment/Plan:  Patient is a 76 year old female status post ex lap, gastrorrhaphy and omental patch with subsequent leak  -Leukocytosis improving, 21.3 from 24.4 -Continue micafungin and Unasyn -Plan for repeat CT on Monday to evaluate for leak -NPO, okay for sips and spoonfuls of applesauce with medications -TPN -PRN pain medications and antiemetics -Bilirubin elevated but stable, will continue to monitor -SCDs and SQ heparin -All questions of husband and granddaughter answered at bedside   LOS: 13 days    Ola Fawver A Mersades Barbaro 06/17/2022

## 2022-06-17 NOTE — Progress Notes (Signed)
PHARMACY - TOTAL PARENTERAL NUTRITION CONSULT NOTE   Indication: gastric perforation  Patient Measurements: Height: '5\' 1"'$  (154.9 cm) Weight: 54.9 kg (121 lb 0.5 oz) IBW/kg (Calculated) : 47.8 TPN AdjBW (KG): 50.9 Body mass index is 22.87 kg/m.  Assessment:  Pharmacy consulted to dose TPN in patient with gastric perforation.  Glucose / Insulin:    1114-148- 4 units Electrolytes: K 4.8 , mag 1.8 Na 145 Corrected calcium 12.5 Renal: Scr 1.23 Hepatic: albumin 1.7 Tbili 4.4 TG 230> 161   GI Surgeries / Procedures: Exploratory laparotomy, gastrorrhaphy  Central access: PICC double lumen 7/10 TPN start date: 7/10  Nutritional Goals: Goal TPN rate is 70 mL/hr (provides 77 g of protein and 1444 kcals per day)  RD Assessment: Kcal:  1400-1600 Protein:  75-80 gr  Fluid:  >/= 1600 ml daily  Current Nutrition:  NPO  Plan:  Continue TPN at 70 mL/hr at 1800 Electrolytes in TPN: Na 70mq/L, K 40 mEq/L, Ca 0 mEq/L, Mg 7 mEq/L, and Phos 10 mmol/L. Cl:Ac 1:1 Continue 10 units inuslin in TPN  standard MVI and trace elements to TPN Continue Sensitive q6h SSI and adjust as needed  Monitor TPN labs on Mon/Thurs,   SMargot Ables PharmD Clinical Pharmacist 06/17/2022 8:35 AM

## 2022-06-18 DIAGNOSIS — R6521 Severe sepsis with septic shock: Secondary | ICD-10-CM | POA: Diagnosis not present

## 2022-06-18 DIAGNOSIS — A419 Sepsis, unspecified organism: Secondary | ICD-10-CM | POA: Diagnosis not present

## 2022-06-18 LAB — GLUCOSE, CAPILLARY
Glucose-Capillary: 131 mg/dL — ABNORMAL HIGH (ref 70–99)
Glucose-Capillary: 103 mg/dL — ABNORMAL HIGH (ref 70–99)
Glucose-Capillary: 122 mg/dL — ABNORMAL HIGH (ref 70–99)
Glucose-Capillary: 105 mg/dL — ABNORMAL HIGH (ref 70–99)

## 2022-06-18 LAB — CBC
HCT: 33.8 % — ABNORMAL LOW (ref 36.0–46.0)
Hemoglobin: 10.8 g/dL — ABNORMAL LOW (ref 12.0–15.0)
MCH: 29.9 pg (ref 26.0–34.0)
MCHC: 32 g/dL (ref 30.0–36.0)
MCV: 93.6 fL (ref 80.0–100.0)
Platelets: 618 10*3/uL — ABNORMAL HIGH (ref 150–400)
RBC: 3.61 MIL/uL — ABNORMAL LOW (ref 3.87–5.11)
RDW: 15.4 % (ref 11.5–15.5)
WBC: 22.1 10*3/uL — ABNORMAL HIGH (ref 4.0–10.5)
nRBC: 0 % (ref 0.0–0.2)

## 2022-06-18 LAB — BASIC METABOLIC PANEL
Anion gap: 6 (ref 5–15)
BUN: 39 mg/dL — ABNORMAL HIGH (ref 8–23)
CO2: 27 mmol/L (ref 22–32)
Calcium: 10.4 mg/dL — ABNORMAL HIGH (ref 8.9–10.3)
Chloride: 111 mmol/L (ref 98–111)
Creatinine, Ser: 1.21 mg/dL — ABNORMAL HIGH (ref 0.44–1.00)
GFR, Estimated: 46 mL/min — ABNORMAL LOW (ref >60)
Glucose, Bld: 98 mg/dL (ref 70–99)
Potassium: 4 mmol/L (ref 3.5–5.1)
Sodium: 144 mmol/L (ref 135–145)

## 2022-06-18 LAB — PHOSPHORUS: Phosphorus: 3.7 mg/dL (ref 2.5–4.6)

## 2022-06-18 LAB — MAGNESIUM: Magnesium: 1.9 mg/dL (ref 1.7–2.4)

## 2022-06-18 MED ORDER — TRAVASOL 10 % IV SOLN
INTRAVENOUS | Status: AC
  Filled 2022-06-18: qty 772.8

## 2022-06-18 NOTE — Progress Notes (Signed)
Pt moved into chair,husband at bedside. Pt had bed linen changed and new female external cath placed. Pt has amber colored urine and nursing staff emptied 750 ml. Pt more alert and is able to interact with nursing staff. Pt is alert to self and stated that she is not in any pain or discomfort. Pt IS "READY TO GO". Nursing staff will continue to monitor.

## 2022-06-18 NOTE — Progress Notes (Signed)
[facility] Progress Note  14 Days Post-Op  Subjective: Patient seen and examined.  She is resting comfortably in bed.  She has no complaints at this time.  She states that she feels she needs to have a bowel movement.  She denies abdominal pain, nausea, and vomiting.  She confirms passing flatus.  Objective: Vital signs in last 24 hours: Temp:  [98.2 F (36.8 C)-98.5 F (36.9 C)] 98.2 F (36.8 C) (07/16 0446) Pulse Rate:  [76-87] 76 (07/16 0446) Resp:  [16-18] 18 (07/16 0446) BP: (140-158)/(73-74) 140/74 (07/16 0446) SpO2:  [94 %] 94 % (07/15 1342) Weight:  [57.9 kg] 57.9 kg (07/16 0500) Last BM Date : 06/14/22  Intake/Output from previous day: 07/15 0701 - 07/16 0700 In: 851.8 [I.V.:851.8] Out: 1935 [Urine:1900; Drains:5] Intake/Output this shift: No intake/output data recorded.  General appearance: alert, cooperative, and no distress GI: Abdomen soft, nondistended, no percussion tenderness, mild incisional tenderness to palpation; no rigidity, guarding, rebound tenderness; midline incision dressing intact without strikethrough, JP drain with purulent output, minimal in bulb at this time, 35 cc in last 24 hours  Lab Results:  Recent Labs    06/17/22 0546 06/18/22 0617  WBC 21.3* 22.1*  HGB 11.4* 10.8*  HCT 35.7* 33.8*  PLT 628* 618*   BMET Recent Labs    06/17/22 0546 06/18/22 0617  NA 145 144  K 4.8 4.0  CL 111 111  CO2 29 27  GLUCOSE 146* 98  BUN 40* 39*  CREATININE 1.23* 1.21*  CALCIUM 10.7* 10.4*   PT/INR No results for input(s): "LABPROT", "INR" in the last 72 hours.  Studies/Results: No results found.  Anti-infectives: Anti-infectives (From admission, onward)    Start     Dose/Rate Route Frequency Ordered Stop   06/16/22 0800  Ampicillin-Sulbactam (UNASYN) 3 g in sodium chloride 0.9 % 100 mL IVPB        3 g 200 mL/hr over 30 Minutes Intravenous Every 6 hours 06/16/22 0756     06/13/22 1400  Ampicillin-Sulbactam (UNASYN) 3 g  in sodium chloride 0.9 % 100 mL IVPB  Status:  Discontinued        3 g 200 mL/hr over 30 Minutes Intravenous Every 8 hours 06/13/22 0821 06/13/22 0823   06/13/22 1000  Ampicillin-Sulbactam (UNASYN) 3 g in sodium chloride 0.9 % 100 mL IVPB  Status:  Discontinued        3 g 200 mL/hr over 30 Minutes Intravenous Every 12 hours 06/13/22 0823 06/16/22 0756   06/11/22 2200  piperacillin-tazobactam (ZOSYN) IVPB 3.375 g  Status:  Discontinued        3.375 g 12.5 mL/hr over 240 Minutes Intravenous Every 8 hours 06/11/22 1415 06/13/22 0821   06/11/22 1500  piperacillin-tazobactam (ZOSYN) IVPB 3.375 g        3.375 g 100 mL/hr over 30 Minutes Intravenous  Once 06/11/22 1410 06/12/22 0940   06/07/22 0200  fluconazole (DIFLUCAN) IVPB 200 mg  Status:  Discontinued        200 mg 100 mL/hr over 60 Minutes Intravenous Every 24 hours 06/06/22 0901 06/06/22 1031   06/06/22 1200  micafungin (MYCAMINE) 100 mg in sodium chloride 0.9 % 100 mL IVPB        100 mg 105 mL/hr over 1 Hours Intravenous Every 24 hours 06/06/22 1031 06/20/22 2359   06/05/22 1800  piperacillin-tazobactam (ZOSYN) IVPB 3.375 g        3.375 g 12.5 mL/hr over 240 Minutes Intravenous Every 12 hours 06/05/22 0825 06/09/22  2000   06/04/22 0600  piperacillin-tazobactam (ZOSYN) IVPB 3.375 g  Status:  Discontinued        3.375 g 12.5 mL/hr over 240 Minutes Intravenous Every 8 hours 06/04/22 0219 06/05/22 0825   06/04/22 0315  fluconazole (DIFLUCAN) IVPB 200 mg  Status:  Discontinued        200 mg 100 mL/hr over 60 Minutes Intravenous Every 24 hours 06/04/22 0217 06/04/22 0219   06/04/22 0315  fluconazole (DIFLUCAN) IVPB 200 mg  Status:  Discontinued        200 mg 100 mL/hr over 60 Minutes Intravenous Every 24 hours 06/04/22 0219 06/06/22 0901   06/03/22 2130  ceFEPIme (MAXIPIME) 2 g in sodium chloride 0.9 % 100 mL IVPB        2 g 200 mL/hr over 30 Minutes Intravenous  Once 06/03/22 2127 06/03/22 2209   06/03/22 2130  metroNIDAZOLE (FLAGYL)  IVPB 500 mg  Status:  Discontinued        500 mg 100 mL/hr over 60 Minutes Intravenous Every 12 hours 06/03/22 2127 06/04/22 0217       Assessment/Plan:  Patient is a 76 year old female status post ex lap, gastrorrhaphy and omental patch with subsequent leak  -Leukocytosis stable, 22.1 from 21.3 -Continue micafungin and Unasyn -NPO, okay for sips and spoonfuls of applesauce with medications -Plan for repeat CT on 7/17 to evaluate for leak -TPN -PRN pain control and antiemetics -SCDs and SQ heparin -Appreciate hospitalist recommendations   LOS: 14 days    Tatayana Beshears A Devani Odonnel 06/18/2022

## 2022-06-18 NOTE — Progress Notes (Signed)
PHARMACY - TOTAL PARENTERAL NUTRITION CONSULT NOTE   Indication: gastric perforation  Patient Measurements: Height: '5\' 1"'$  (154.9 cm) Weight: 57.9 kg (127 lb 10.3 oz) IBW/kg (Calculated) : 47.8 TPN AdjBW (KG): 57.9 Body mass index is 24.12 kg/m.  Assessment:  Pharmacy consulted to dose TPN in patient with gastric perforation.  Glucose / Insulin:    103-131- 43units Electrolytes: K 4.8> 4.0 , mag 1.9 Na 144 Corrected calcium 12.2 Renal: Scr 1.23 Hepatic: albumin 1.7 Tbili 4.4 TG 230> 161   GI Surgeries / Procedures: Exploratory laparotomy, gastrorrhaphy  Central access: PICC double lumen 7/10 TPN start date: 7/10  Nutritional Goals: Goal TPN rate is 70 mL/hr (provides 77 g of protein and 1444 kcals per day)  RD Assessment: Kcal:  1400-1600 Protein:  75-80 gr  Fluid:  >/= 1600 ml daily  Current Nutrition:  NPO  Plan:  Continue TPN at 70 mL/hr at 1800 Electrolytes in TPN: Na 64mq/L, K 50 mEq/L, Ca 0 mEq/L, Mg 7 mEq/L, and Phos 10 mmol/L. Cl:Ac 1:1 Continue 10 units inuslin in TPN  standard MVI and trace elements to TPN Continue Sensitive q6h SSI and adjust as needed  Monitor TPN labs on Mon/Thurs,   SMargot Ables PharmD Clinical Pharmacist 06/18/2022 9:10 AM

## 2022-06-18 NOTE — Progress Notes (Signed)
PROGRESS NOTE  Carolyn Erickson PPJ:[id number] DOB: [birth year] DOA: 06/03/2022 PCP: Dettinger, Fransisca Kaufmann, MD  Brief History:  76 year old female with history of major neurocognitive disorder, hypertension, hyperlipidemia, depression, left breast cancer presenting with generalized weakness and abdominal pain.  Apparently, the patient has been having abdominal pain since April 2023.  The patient had been started on pantoprazole at that time.  There was no improvement.  She followed up with her PCP on 06/02/2022.  Referral was given to see gastroenterology.  Abdominal pain had worsened over the past 2 days prior to admission with worsening generalized weakness.  There is no fevers, chills, chest pain, shortness breath, vomiting, diarrhea.  There is no hematochezia or melena noted.  In the emergency department, the patient was hypotensive with a blood pressure of 71/54.  She was somnolent.  CT of the abdomen and pelvis was performed and showed changes consistent with a perforated gastric ulcer.  Dr. Constance Haw was consulted, and the patient was taken to the OR for exploratory laparotomy, gastrorrhaphy, omental patch.  A left IJ central line was also placed.  TRH is consulted for medical management  In the ED, the patient had low-grade fever 99.5 F.  She was tachycardic and hypotensive with blood pressure 68/53.  WBC 4.1, hemoglobin 14.6, platelets 308,000.  BMP showed sodium 137, potassium 4.1, bicarbonate 22, serum creatinine 2.14.  Postoperatively, the patient was started on Zosyn and fluconazole.  She remained on the ventilator.  Preoperative chest x-ray showed bibasilar atelectasis.  There was free air under the diaphragm.   Assessment and Plan: * Septic shock - RESOLVED Presented with hypotension, tachycardia, and lactic acid peaked at 6.8 Pt successfully weaned off norepinephrine infusion Secondary to gastric perforation with peritonitis - Patient s/p Ex lap, gastrorrhaphy and omental patch.   06/03/22 blood cultures--C. glabrata Continue IV Unasyn per ID  discontinued fluconazole per ID,  -continue echinocandin (micafungin) -Leukocytosis persist, trending down very slowly -Urine  culture NGTD -Repeat CT abdomen and pelvis planned for Monday, 06/19/2022 to look for possible leak  Gastric perforation ([place]) Suspect this may be due to chronic NSAID use -Continue PPI twice daily -06/04/2022--ex laparotomy with omental patch -Postoperative care per Dr. Constance Haw -TNA therapy on hold temporarily due to need for CVL holiday -UGI 7/6 - no leak seen.  7/9 - CT c/w leak present - PICC line placed 7/10 and TPN started 7/10 - surgery planning to repeat CT on Monday 06/19/22  Nutrition----n.p.o. except for medications with spoonful of applesauce -Continue IV TPN -General surgery considering resuming oral intake after 06/19/2022 if repeat CT abdomen and pelvis failed to demonstrate any leak at that time  Fungemia 06/03/22 blood cultures--Candida glabrata -d/c fluconazole per ID consult -continue echinocandin per ID  -discussed with general surgery>>removed central line 7/4 for line holiday -repeat blood culture on 06/06/22 NGTD  Acute renal failure superimposed on stage 3a chronic kidney disease (HCC) -Serum creatinine peaked at 2.28 but trending down  -Secondary to hemodynamic changes/sepsis and hypovolemia -Creatinine much improved (currently in the 1.2 range) -renally adjust medications, avoid nephrotoxic agents / dehydration  / hypotension  Leukocytosis -- WBC peaked at 31 K -Leukocytosis persist, trending down very slowly (currently in the 22 K range) -- appreciate ID and surgery recommendations,  -continue antifungal and Unasyn  Hyperbilirubinemia -- secondary to persistent leak.  Continuing to monitor.   Volume overload -- treated with IV lasix 40 mg x 2 on 7/7 and resolved  Hypomagnesemia/hypokalemia resolved with replacement  Acute respiratory failure with hypoxia  (HCC) Currently PRVC, RR 20, Vt 380, FiO2 0.4 -7/3 personally reviewed CXR--increased interstitial marking -7/3 ABG--7.35/36/107/20 (0.4) -7/3--extubated -7/4-5--stable on 4L [place] -7/6 - down to 2L/min [place] Oxygen weaned off  Major neurocognitive disorder/dementia/depression Patient had MoCa 17/30 on 03/14/22 -Continue donezepil, venlafaxine, bupropion    GERD (gastroesophageal reflux disease) Continue pantoprazole IV twice daily  Essential hypertension  amlodipine 2.5 mg daily  Mixed hyperlipidemia -c/n atorvastatin   Social/Ethics--- plan of care discussed with patient's husband multiple times almost on a daily basis usually at bedside.... Patient's husband accurately verbalizes understanding of the plan of care -Patient's husband asked appropriate questions -Patient remains a full code without limitations to treatment -Palliative care input appreciated  Family Communication:   Discussed with husband at bedside  consultants:  general surgery is primary service, hospitalist service is consulting  Code Status:  FULL   DVT Prophylaxis:  SCDs  Procedures: As Listed in Progress Note Above  Antibiotics: Fluconazole 7/2>>7/4 Micafungin 7/4>> Zosyn 7/2>>7/11 Unasyn 7/11>>  Subjective:  06/18/22 -husband at bedside, questions answered -Patient appears comfortable -No significant abdominal pain or other concerns -She is passing gas  Objective: Vitals:   06/17/22 0631 06/17/22 1342 06/18/22 0446 06/18/22 0500  BP:  (!) 158/73 140/74   Pulse:  87 76   Resp:  16 18   Temp:  98.5 F (36.9 C) 98.2 F (36.8 C)   TempSrc:  Oral    SpO2:  94%    Weight: 54.9 kg   57.9 kg  Height:    '5\' 1"'$  (1.549 m)    Intake/Output Summary (Last 24 hours) at 06/18/2022 1130 Last data filed at 06/18/2022 1100 Gross per 24 hour  Intake 1051.8 ml  Output 2460 ml  Net -1408.2 ml   Physical Exam  Gen:- Awake Alert, in no acute distress  HEENT:- [place].AT, No sclera  icterus Ears-HOH Neck-Supple Neck,No JVD,.  Lungs-   fair air movement bilaterally , no wheezing CV- S1, S2 normal, RRR Abd-  +ve B.Sounds, Abd Soft, mild abdominal discomfort with palpation, JP drain with somewhat purulent drainage  -extremity/Skin:- good pedal pulses  Psych-affect is appropriate, oriented x3 Neuro-generalized weakness, no new focal deficits, no tremors MSK-right arm PICC line  Data Reviewed: I have personally reviewed following labs and imaging studies Basic Metabolic Panel: Recent Labs  Lab 06/12/22 0514 06/13/22 0531 06/15/22 0606 06/17/22 0546 06/18/22 0617  NA 142 141 142 145 144  K 3.0* 3.5 4.5 4.8 4.0  CL 107 107 109 111 111  CO2 '31 29 29 29 27  '$ GLUCOSE 129* 174* 182* 146* 98  BUN 40* 37* 34* 40* 39*  CREATININE 1.44* 1.37* 1.13* 1.23* 1.21*  CALCIUM 9.2 9.2 10.2 10.7* 10.4*  MG 1.6* 2.3 1.8  --  1.9  PHOS 3.5 3.5 2.7  --  3.7   Liver Function Tests: Recent Labs  Lab 06/12/22 0514 06/13/22 0531 06/15/22 0606 06/17/22 0546  AST 27 27 35 54*  ALT '18 18 25 '$ 41  ALKPHOS 99 92 101 181*  BILITOT 4.6* 4.6* 3.9* 4.4*  PROT 4.7* 4.7* 5.3* 6.2*  ALBUMIN <1.5* <1.5* 1.6* 1.7*   No results for input(s): "LIPASE", "AMYLASE" in the last 168 hours.  No results for input(s): "AMMONIA" in the last 168 hours. Coagulation Profile: No results for input(s): "INR", "PROTIME" in the last 168 hours.  CBC: Recent Labs  Lab 06/12/22 0514 06/13/22 0531 06/14/22 8099 06/15/22 0606 06/16/22 8338 06/17/22 2505  06/18/22 0617  WBC 30.3* 28.3* 25.5* 25.9* 24.4* 21.3* 22.1*  NEUTROABS 26.2* 24.7* 22.4* 21.8* 20.1*  --   --   HGB 11.4* 11.4* 11.9* 11.3* 11.3* 11.4* 10.8*  HCT 35.9* 35.4* 36.3 34.5* 34.5* 35.7* 33.8*  MCV 92.3 92.4 92.4 92.7 93.0 93.0 93.6  PLT 247 304 354 475* 527* 628* 618*   Cardiac Enzymes: No results for input(s): "CKTOTAL", "CKMB", "CKMBINDEX", "TROPONINI" in the last 168 hours. BNP: Invalid input(s): "POCBNP" CBG: Recent Labs  Lab  06/17/22 0543 06/17/22 1132 06/17/22 1623 06/18/22 0255 06/18/22 0620  GLUCAP 114* 159* 109* 131* 103*   HbA1C: No results for input(s): "HGBA1C" in the last 72 hours. Urine analysis:    Component Value Date/Time   COLORURINE AMBER (A) 06/04/2022 0520   APPEARANCEUR CLOUDY (A) 06/04/2022 0520   APPEARANCEUR Cloudy (A) 04/13/2021 1530   LABSPEC 1.027 06/04/2022 0520   PHURINE 5.0 06/04/2022 0520   GLUCOSEU NEGATIVE 06/04/2022 0520   HGBUR NEGATIVE 06/04/2022 0520   BILIRUBINUR NEGATIVE 06/04/2022 0520   BILIRUBINUR Negative 04/13/2021 1530   KETONESUR NEGATIVE 06/04/2022 0520   PROTEINUR 100 (A) 06/04/2022 0520   NITRITE NEGATIVE 06/04/2022 0520   LEUKOCYTESUR SMALL (A) 06/04/2022 0520    No results found for this or any previous visit (from the past 240 hour(s)).    Scheduled Meds:  amLODipine  2.5 mg Oral Daily   atorvastatin  20 mg Oral QPM   buPROPion  150 mg Oral BID   Chlorhexidine Gluconate Cloth  6 each Topical Daily   donepezil  10 mg Oral QHS   heparin  5,000 Units Subcutaneous Q8H   insulin aspart  0-9 Units Subcutaneous Q6H   mouth rinse  15 mL Mouth Rinse Q4H   pantoprazole (PROTONIX) IV  40 mg Intravenous Q12H   sodium chloride flush  10-40 mL Intracatheter Q12H   venlafaxine  37.5 mg Oral BID   Continuous Infusions:  ampicillin-sulbactam (UNASYN) IV 3 g (06/18/22 1046)   micafungin (MYCAMINE) 100 mg in sodium chloride 0.9 % 100 mL IVPB 100 mg (06/17/22 1219)   TPN ADULT (ION) 70 mL/hr at 06/17/22 1746   TPN ADULT (ION)      Procedures/Studies: US Abdomen Limited RUQ (LIVER/GB)  Result Date: 06/12/2022 CLINICAL DATA:  Hyperbilirubinemia EXAM: ULTRASOUND ABDOMEN LIMITED RIGHT UPPER QUADRANT COMPARISON:  CT abdomen and pelvis 06/11/2022 FINDINGS: Gallbladder: Sludge within gallbladder. No definite shadowing calculi. Mild gallbladder wall thickening. No sonographic Murphy sign. Common bile duct: Diameter: 3 mm, normal Liver: Normal echogenicity without  mass or nodularity. No intrahepatic biliary dilatation. Portal vein is patent on color Doppler imaging with normal direction of blood flow towards the liver. Other: Perihepatic ascites identified, containing septations/loculations. IMPRESSION: Sludge within gallbladder with mild gallbladder wall thickening, a nonspecific finding in the setting of ascites. No biliary dilatation or focal hepatic abnormality. Electronically Signed   By: Lavonia Dana M.D.   On: 06/12/2022 14:40   [place] EKG SITE RITE  Result Date: 06/11/2022 If Site Rite image not attached, placement could not be confirmed due to current cardiac rhythm.  CT ABDOMEN PELVIS WO CONTRAST  Result Date: 06/11/2022 CLINICAL DATA:  EXPLORATORY LAPAROTOMY, omental patchImpression: Worsening leukocytosis. EXAM: CT ABDOMEN AND PELVIS WITHOUT CONTRAST TECHNIQUE: Multidetector CT imaging of the abdomen and pelvis was performed following the standard protocol without IV contrast. RADIATION DOSE REDUCTION: This exam was performed according to the departmental dose-optimization program which includes automated exposure control, adjustment of the mA and/or kV according to patient size and/or  use of iterative reconstruction technique. COMPARISON:  06/03/2022. FINDINGS: Lower chest: Moderate pleural effusions. Dependent lower lobe and right middle lobe opacity, likely atelectasis. Additional ground-glass opacities are noted, most evident in the right middle lobe, suspicious for infection. Effusions are increased on the left and new on the right since the prior CT. Lung base opacities are mostly new. Hepatobiliary: No focal liver abnormality is seen. No gallstones, gallbladder wall thickening, or biliary dilatation. Pancreas: Unremarkable. No pancreatic ductal dilatation or surrounding inflammatory changes. Spleen: Normal in size without focal abnormality. Adrenals/Urinary Tract: No adrenal masses. No renal masses or hydronephrosis. Bladder is unremarkable. Stomach/Bowel:  Stomach is mostly decompressed. There is an apparent defect along the anterior aspect of the mid to distal stomach through which contrast and air has collected. The ill-defined collection of contrast and air lies in the mid to upper abdomen near the midline, with an adjacent surgical drain, and deep to the midline laparotomy incision. Poorly defined small bowel loops in the central abdomen. There is no colon or small bowel dilation to suggest obstruction. No colonic wall thickening or convincing inflammation. Vascular/Lymphatic: Aortic atherosclerosis. Prominent lymph nodes noted along the gastrohepatic ligament. There are no enlarged lymph nodes. Reproductive: Status post hysterectomy. No adnexal masses. Other: Small amount of ascites. Generalized increased attenuation of the mesenteric/peritoneal fat consistent with edema. Diffuse subcutaneous soft tissue edema. Musculoskeletal: No acute finding. IMPRESSION: 1. There is no defined fluid collection to indicate an abscess. 2. There is extraluminal air and contrast in the anterior central to upper abdomen, that appears to be arising from a defect along the anterior gastric wall. The extraluminal contrast and air lies adjacent to the surgical drain. 3. Small amount of ascites is similar to the previous CT. 4. Mesenteric/peritoneal and diffuse subcutaneous edema. 5. Moderate pleural effusions with associated lung base atelectasis. Additional areas of ground-glass lung opacity are suspicious for multifocal pneumonia. Electronically Signed   By: Lajean Manes M.D.   On: 06/11/2022 12:56   DG CHEST PORT 1 VIEW  Result Date: 06/09/2022 CLINICAL DATA:  Weakness, wheezing EXAM: PORTABLE CHEST 1 VIEW COMPARISON:  06/05/2022 FINDINGS: Interval endotracheal and esophagogastric extubation. Small to moderate bilateral pleural effusions, increased compared to prior examination, with associated atelectasis or consolidation and diffuse bilateral interstitial pulmonary opacity.  Cardiomegaly. IMPRESSION: 1. Small to moderate bilateral pleural effusions, increased compared to prior examination, with associated atelectasis or consolidation and diffuse bilateral interstitial pulmonary opacity. Findings are most consistent with worsened edema. 2. Interval endotracheal and esophagogastric extubation. 3. Cardiomegaly. Electronically Signed   By: Delanna Ahmadi M.D.   On: 06/09/2022 12:25   DG UGI W SMALL BOWEL  Result Date: 06/08/2022 CLINICAL DATA:  Post repair of 1.5 cm diameter perforated anterior wall gastric antral ulcer EXAM: WATER SOLUBLE UPPER GI SERIES TECHNIQUE: Single-column upper GI series was performed using water soluble contrast. Radiation Exposure Index (as provided by the fluoroscopic device): 43.1 mGy Kerma CONTRAST:  100 cc Omnipaque 300 via NG tube COMPARISON:  CT abdomen and pelvis 06/03/2022 FLUOROSCOPY: Fluoroscopy Time:  2 minutes 36 seconds Radiation Exposure Index (if provided by the fluoroscopic device): 43.1 Number of Acquired Spot Images: Multiple fluoroscopic screen captures and cine series FINDINGS: Contrast administered via NG tube opacifies the gastric lumen. Patent pylorus with passage of contrast through normal appearing duodenal C loop into proximal jejunum with incidentally noted duodenal diverticulum at second portion. Irregularity of rugal folds with thickening and distortion at anterior wall of gastric antrum at site of perforation repair. The third  portion of the duodenum is superimposed with the inferior wall of the gastric antrum, mildly limiting exam. A blush of contrast is seen inferior to the antrum, superimposed with the third portion of the duodenum throughout the study. This appears to move in concert with the duodenum with respiration. No contrast opacification of the JP drain. No gross evidence of contrast leak identified though it is difficult to completely exclude a small leak at the site of repair. IMPRESSION: Distortion and thickening of  rugal folds at site of antral gastric ulcer repair. No gross evidence of leak at the site of ulcer repair as discussed above. Electronically Signed   By: Lavonia Dana M.D.   On: 06/08/2022 15:32   ECHOCARDIOGRAM COMPLETE  Result Date: 06/07/2022    ECHOCARDIOGRAM REPORT   Patient Name:   JOVONDA SELNER Date of Exam: 06/07/2022 Medical Rec #:  [id number]       Height:       61.0 in Accession #:    [id number]      Weight:       112.2 lb Date of Birth:  11-16-1946        BSA:          1.477 m Patient Age:    89 years        BP:           116/46 mmHg Patient Gender: F               HR:           104 bpm. Exam Location:  [facility] Procedure: 2D Echo, Cardiac Doppler and Color Doppler Indications:    Bacteremia  History:        Patient has no prior history of Echocardiogram examinations.                 Signs/Symptoms:Bacteremia; Risk Factors:Hypertension and                 Dyslipidemia. Breast CA.  Sonographer:    Wenda Low Referring Phys: [id number] [place]  1. Abnormal septal motion . Left ventricular ejection fraction, by estimation, is 55 to 60%. The left ventricle has normal function. The left ventricle has no regional wall motion abnormalities. Left ventricular diastolic parameters were normal.  2. Right ventricular systolic function is normal. The right ventricular size is normal. There is mildly elevated pulmonary artery systolic pressure.  3. Moderate pleural effusion in the left lateral region.  4. The mitral valve is abnormal. Trivial mitral valve regurgitation. No evidence of mitral stenosis.  5. Thickening and calcification noted . The tricuspid valve is abnormal.  6. Some thickening in the LVOT near intervalvular fibrosa Given this and nodular calcification of PV suggest TEE if suspicion for SBE high. The aortic valve is tricuspid. There is moderate calcification of the aortic valve. There is moderate thickening of the aortic valve. Aortic valve regurgitation is mild. Aortic valve  sclerosis/calcification is present, without any evidence of aortic stenosis.  7. Nodular calcification seen on PV. The pulmonic valve was abnormal.  8. The inferior vena cava is normal in size with greater than 50% respiratory variability, suggesting right atrial pressure of 3 mmHg. FINDINGS  Left Ventricle: Abnormal septal motion. Left ventricular ejection fraction, by estimation, is 55 to 60%. The left ventricle has normal function. The left ventricle has no regional wall motion abnormalities. The left ventricular internal cavity size was normal in size. There is no left ventricular hypertrophy. Left ventricular  diastolic parameters were normal. Right Ventricle: The right ventricular size is normal. No increase in right ventricular wall thickness. Right ventricular systolic function is normal. There is mildly elevated pulmonary artery systolic pressure. The tricuspid regurgitant velocity is 3.21  m/s, and with an assumed right atrial pressure of 3 mmHg, the estimated right ventricular systolic pressure is 56.2 mmHg. Left Atrium: Left atrial size was normal in size. Right Atrium: Right atrial size was normal in size. Pericardium: There is no evidence of pericardial effusion. Mitral Valve: The mitral valve is abnormal. There is mild thickening of the mitral valve leaflet(s). There is mild calcification of the mitral valve leaflet(s). Mild mitral annular calcification. Trivial mitral valve regurgitation. No evidence of mitral valve stenosis. MV peak gradient, 3.9 mmHg. The mean mitral valve gradient is 1.0 mmHg. Tricuspid Valve: Thickening and calcification noted. The tricuspid valve is abnormal. Tricuspid valve regurgitation is trivial. No evidence of tricuspid stenosis. Aortic Valve: Some thickening in the LVOT near intervalvular fibrosa Given this and nodular calcification of PV suggest TEE if suspicion for SBE high. The aortic valve is tricuspid. There is moderate calcification of the aortic valve. There is  moderate thickening of the aortic valve. Aortic valve regurgitation is mild. Aortic valve sclerosis/calcification is present, without any evidence of aortic stenosis. Aortic valve mean gradient measures 5.0 mmHg. Aortic valve peak gradient measures 10.3 mmHg. Aortic valve area, by VTI measures 2.02 cm. Pulmonic Valve: Nodular calcification seen on PV. The pulmonic valve was abnormal. Pulmonic valve regurgitation is trivial. No evidence of pulmonic stenosis. Aorta: The aortic root is normal in size and structure. Venous: The inferior vena cava is normal in size with greater than 50% respiratory variability, suggesting right atrial pressure of 3 mmHg. IAS/Shunts: No atrial level shunt detected by color flow Doppler. Additional Comments: There is a moderate pleural effusion in the left lateral region.  LEFT VENTRICLE PLAX 2D LVIDd:         4.50 cm     Diastology LVIDs:         3.00 cm     LV e' medial:    7.29 cm/s LV PW:         0.80 cm     LV E/e' medial:  11.8 LV IVS:        0.80 cm     LV e' lateral:   9.79 cm/s LVOT diam:     1.80 cm     LV E/e' lateral: 8.8 LV SV:         55 LV SV Index:   37 LVOT Area:     2.54 cm  LV Volumes (MOD) LV vol d, MOD A2C: 43.7 ml LV vol d, MOD A4C: 52.7 ml LV vol s, MOD A2C: 17.3 ml LV vol s, MOD A4C: 23.8 ml LV SV MOD A2C:     26.4 ml LV SV MOD A4C:     52.7 ml LV SV MOD BP:      28.6 ml RIGHT VENTRICLE RV Basal diam:  3.10 cm RV Mid diam:    2.90 cm RV S prime:     14.50 cm/s TAPSE (M-mode): 3.0 cm LEFT ATRIUM             Index        RIGHT ATRIUM           Index LA diam:        3.60 cm 2.44 cm/m   RA Area:     11.50 cm LA Vol (  A2C):   50.9 ml 34.45 ml/m  RA Volume:   21.80 ml  14.76 ml/m LA Vol (A4C):   43.7 ml 29.58 ml/m LA Biplane Vol: 49.8 ml 33.71 ml/m  AORTIC VALVE                     PULMONIC VALVE AV Area (Vmax):    1.92 cm      PV Vmax:       0.98 m/s AV Area (Vmean):   1.84 cm      PV Peak grad:  3.9 mmHg AV Area (VTI):     2.02 cm AV Vmax:           160.50  cm/s AV Vmean:          103.550 cm/s AV VTI:            0.274 m AV Peak Grad:      10.3 mmHg AV Mean Grad:      5.0 mmHg LVOT Vmax:         121.00 cm/s LVOT Vmean:        74.800 cm/s LVOT VTI:          0.218 m LVOT/AV VTI ratio: 0.79  AORTA Ao Root diam: 3.30 cm MITRAL VALVE               TRICUSPID VALVE MV Area (PHT): 6.02 cm    TR Peak grad:   41.2 mmHg MV Area VTI:   2.25 cm    TR Vmax:        321.00 cm/s MV Peak grad:  3.9 mmHg MV Mean grad:  1.0 mmHg    SHUNTS MV Vmax:       0.99 m/s    Systemic VTI:  0.22 m MV Vmean:      48.8 cm/s   Systemic Diam: 1.80 cm MV Decel Time: 126 msec MV E velocity: 86.00 cm/s MV A velocity: 72.70 cm/s MV E/A ratio:  1.18 Jenkins Rouge MD Electronically signed by Jenkins Rouge MD Signature Date/Time: 06/07/2022/10:28:36 AM    Final    DG CHEST PORT 1 VIEW  Result Date: 06/05/2022 CLINICAL DATA:  76 year old female with respiratory failure. Intubated. EXAM: PORTABLE CHEST 1 VIEW COMPARISON:  Portable chest 06/04/2022 and earlier. FINDINGS: Portable AP semi upright view at 0527 hours. Endotracheal tube tip in good position between the clavicles and carina. Stable left IJ central line and visible enteric tube. Increased left lung base and retrocardiac opacity now mostly obscuring the left hemidiaphragm. Slightly lower lung volumes overall. Mediastinal contours remain normal. No pneumothorax, pulmonary edema, or definite effusion. Negative right lung. Partially visible midline abdominal skin staples. Paucity of bowel gas in the upper abdomen. Stable left chest and axillary surgical clips. IMPRESSION: 1. Stable lines and tubes. 2. Increased left lower lobe collapse or consolidation since yesterday. Electronically Signed   By: Genevie Ann M.D.   On: 06/05/2022 08:26   DG Chest Port 1 View  Result Date: 06/04/2022 CLINICAL DATA:  Endotracheal tube, nasogastric tube and central line placement. EXAM: PORTABLE CHEST 1 VIEW COMPARISON:  June 03, 2022 FINDINGS: Since the prior study there has  been interval placement of an endotracheal tube. Its distal tip is seen approximately 3.8 cm from the carina. Interval nasogastric tube placement is also seen. Its distal and extends into the gastric lumen. There is a left internal jugular venous catheter with its distal tip overlying the distal superior vena cava. This is approximately 6 mm proximal  to the junction of the superior vena cava and right atrium. The heart size and mediastinal contours are within normal limits. Mild to moderate severity atelectasis and/or infiltrate is seen within the left lung base. This represents a new finding when compared to the prior study. There is no evidence of a pleural effusion or pneumothorax. Radiopaque surgical clips are seen along the left axilla. The air seen below the right hemidiaphragm on the prior study is no longer visualized. The visualized skeletal structures are unremarkable. IMPRESSION: 1. Interval endotracheal tube, nasogastric tube and left internal jugular venous catheter placement positioning, as described above. 2. Mild to moderate severity left basilar atelectasis and/or infiltrate. Electronically Signed   By: Virgina Norfolk M.D.   On: 06/04/2022 02:47   CT Abdomen Pelvis Wo Contrast  Result Date: 06/03/2022 CLINICAL DATA:  Severe weakness for 2 days with acute abdominal pain, initial encounter EXAM: CT ABDOMEN AND PELVIS WITHOUT CONTRAST TECHNIQUE: Multidetector CT imaging of the abdomen and pelvis was performed following the standard protocol without IV contrast. RADIATION DOSE REDUCTION: This exam was performed according to the departmental dose-optimization program which includes automated exposure control, adjustment of the mA and/or kV according to patient size and/or use of iterative reconstruction technique. COMPARISON:  None Available. FINDINGS: Lower chest: Basilar atelectatic changes are noted. Hepatobiliary: Liver is within normal limits. Gallbladder demonstrates a normal appearance.  Pancreas: Unremarkable. No pancreatic ductal dilatation or surrounding inflammatory changes. Spleen: Normal in size without focal abnormality. Adrenals/Urinary Tract: Adrenal glands are within normal limits. Kidneys demonstrate tiny nonobstructing stones on right in the lower pole. No ureteral obstruction is seen. The bladder is decompressed. Stomach/Bowel: Considerable fecal material is noted within the rectal vault which may represent a focal impaction. Diverticular change of the colon is noted as well as some generalized wall thickening throughout the colon. The appendix is within normal limits. Small bowel is within normal limits. Stomach demonstrates a defect in the anterior gastric wall consistent with a perforated ulcer. There is a considerable amount of air and fluid throughout the abdomen with apparent direct communication with the gastric best seen on image number 36 of series 2 and image number 78 of series 6. Vascular/Lymphatic: Aortic atherosclerosis. No enlarged abdominal or pelvic lymph nodes. Reproductive: Status post hysterectomy. No adnexal masses. Other: Free air and free fluid similar to that described above throughout the abdomen. The bowel demonstrates some reactive wall thickening related to the fluid and air. Musculoskeletal: Degenerative changes of the lumbar spine are seen. No other bony abnormality is noted. IMPRESSION: Changes consistent with a perforated gastric ulcer anteriorly with considerable spillage of fluid and air into the abdominal cavity. Some reactive wall thickening is noted within the bowel loops secondary to these changes. Critical Value/emergent results were called by telephone at the time of interpretation on 06/03/2022 at 10:43 pm to Dr. Aletta Edouard , who verbally acknowledged these results. Electronically Signed   By: Inez Catalina M.D.   On: 06/03/2022 22:45   DG Chest Port 1 View  Result Date: 06/03/2022 CLINICAL DATA:  Weakness. EXAM: PORTABLE CHEST 1 VIEW  COMPARISON:  None Available. FINDINGS: Multiple overlying radiopaque cardiac lead wires are seen. The heart size and mediastinal contours are within normal limits. Mild, diffuse, chronic appearing increased lung markings are seen. There is no evidence of an acute infiltrate, pleural effusion or pneumothorax. Radiopaque surgical clips are seen along the lateral aspect of the left chest wall. A crescentic area of air is seen just below the right hemidiaphragm.  Degenerative changes seen throughout the thoracic spine. IMPRESSION: 1. Chronic appearing increased lung markings without evidence of acute or active cardiopulmonary disease. 2. Air seen just below the right hemidiaphragm which may represent an air filled loop of colon. Further evaluation with abdomen pelvis CT is recommended, as the presence of intra-abdominal free air cannot be excluded. Electronically Signed   By: Virgina Norfolk M.D.   On: 06/03/2022 21:22     Roxan Hockey, MD How to contact the [facility] Attending or Consulting provider [place] or covering provider during after hours [place], for this patient?  Check the care team in [facility] and look for a) attending/consulting [facility] provider listed and b) the [facility] team listed Log into [email or web address] and use [place]'s universal password to access. If you do not have the password, please contact the hospital operator. Locate the [facility] provider you are looking for under [facility] and page to a number that you can be directly reached. If you still have difficulty reaching the provider, please page the [facility] (Director on Call) for the Hospitalists listed on amion for assistance.   [facility]  If 7PM-7AM, please contact night-coverage [email or web address] Password [email or web address] 06/18/2022, 11:30 AM   LOS: 14 days

## 2022-06-19 ENCOUNTER — Inpatient Hospital Stay (HOSPITAL_COMMUNITY): Payer: Medicare PPO

## 2022-06-19 DIAGNOSIS — K316 Fistula of stomach and duodenum: Secondary | ICD-10-CM

## 2022-06-19 DIAGNOSIS — A419 Sepsis, unspecified organism: Secondary | ICD-10-CM | POA: Diagnosis not present

## 2022-06-19 DIAGNOSIS — R6521 Severe sepsis with septic shock: Secondary | ICD-10-CM | POA: Diagnosis not present

## 2022-06-19 LAB — GLUCOSE, CAPILLARY
Glucose-Capillary: 137 mg/dL — ABNORMAL HIGH (ref 70–99)
Glucose-Capillary: 129 mg/dL — ABNORMAL HIGH (ref 70–99)
Glucose-Capillary: 123 mg/dL — ABNORMAL HIGH (ref 70–99)
Glucose-Capillary: 85 mg/dL (ref 70–99)
Glucose-Capillary: 129 mg/dL — ABNORMAL HIGH (ref 70–99)

## 2022-06-19 LAB — COMPREHENSIVE METABOLIC PANEL
ALT: 46 U/L — ABNORMAL HIGH (ref 0–44)
AST: 58 U/L — ABNORMAL HIGH (ref 15–41)
Albumin: 1.7 g/dL — ABNORMAL LOW (ref 3.5–5.0)
Alkaline Phosphatase: 203 U/L — ABNORMAL HIGH (ref 38–126)
Anion gap: 7 (ref 5–15)
BUN: 40 mg/dL — ABNORMAL HIGH (ref 8–23)
CO2: 25 mmol/L (ref 22–32)
Calcium: 10.4 mg/dL — ABNORMAL HIGH (ref 8.9–10.3)
Chloride: 111 mmol/L (ref 98–111)
Creatinine, Ser: 1.18 mg/dL — ABNORMAL HIGH (ref 0.44–1.00)
GFR, Estimated: 47 mL/min — ABNORMAL LOW (ref >60)
Glucose, Bld: 130 mg/dL — ABNORMAL HIGH (ref 70–99)
Potassium: 4.6 mmol/L (ref 3.5–5.1)
Sodium: 143 mmol/L (ref 135–145)
Total Bilirubin: 3.7 mg/dL — ABNORMAL HIGH (ref 0.3–1.2)
Total Protein: 6.3 g/dL — ABNORMAL LOW (ref 6.5–8.1)

## 2022-06-19 LAB — CBC
HCT: 37.3 % (ref 36.0–46.0)
HCT: 31.1 % — ABNORMAL LOW (ref 36.0–46.0)
Hemoglobin: 11.4 g/dL — ABNORMAL LOW (ref 12.0–15.0)
Hemoglobin: 10 g/dL — ABNORMAL LOW (ref 12.0–15.0)
MCH: 29.2 pg (ref 26.0–34.0)
MCH: 30.2 pg (ref 26.0–34.0)
MCHC: 30.6 g/dL (ref 30.0–36.0)
MCHC: 32.2 g/dL (ref 30.0–36.0)
MCV: 95.6 fL (ref 80.0–100.0)
MCV: 94 fL (ref 80.0–100.0)
Platelets: 498 10*3/uL — ABNORMAL HIGH (ref 150–400)
Platelets: 562 10*3/uL — ABNORMAL HIGH (ref 150–400)
RBC: 3.9 MIL/uL (ref 3.87–5.11)
RBC: 3.31 MIL/uL — ABNORMAL LOW (ref 3.87–5.11)
RDW: 15.8 % — ABNORMAL HIGH (ref 11.5–15.5)
RDW: 15.9 % — ABNORMAL HIGH (ref 11.5–15.5)
WBC: 19 10*3/uL — ABNORMAL HIGH (ref 4.0–10.5)
WBC: 19 10*3/uL — ABNORMAL HIGH (ref 4.0–10.5)
nRBC: 0 % (ref 0.0–0.2)
nRBC: 0 % (ref 0.0–0.2)

## 2022-06-19 LAB — PROTIME-INR
INR: 1 (ref 0.8–1.2)
Prothrombin Time: 13.3 seconds (ref 11.4–15.2)

## 2022-06-19 LAB — HEMOGLOBIN AND HEMATOCRIT, BLOOD
HCT: 29.1 % — ABNORMAL LOW (ref 36.0–46.0)
HCT: 28.6 % — ABNORMAL LOW (ref 36.0–46.0)
Hemoglobin: 9.5 g/dL — ABNORMAL LOW (ref 12.0–15.0)
Hemoglobin: 9.4 g/dL — ABNORMAL LOW (ref 12.0–15.0)

## 2022-06-19 LAB — PREPARE RBC (CROSSMATCH)

## 2022-06-19 LAB — TRIGLYCERIDES: Triglycerides: 144 mg/dL (ref <150)

## 2022-06-19 LAB — MAGNESIUM: Magnesium: 2.2 mg/dL (ref 1.7–2.4)

## 2022-06-19 LAB — PHOSPHORUS: Phosphorus: 4 mg/dL (ref 2.5–4.6)

## 2022-06-19 MED ORDER — SODIUM CHLORIDE 0.9% IV SOLUTION: Freq: Once | INTRAVENOUS | Status: DC

## 2022-06-19 MED ORDER — TRAVASOL 10 % IV SOLN
INTRAVENOUS | Status: AC
  Filled 2022-06-19: qty 772.8

## 2022-06-19 NOTE — Progress Notes (Signed)
Family conference with pt, pt's husband Dellis Filbert, daughter Karlene Einstein Romie Minus) , son Gerald Stabs , grand daughter Vikki Ports and [place]- son Jeneen Rinks   -Request DNR/DNI status without limitations to treatment otherwise  Roxan Hockey, MD

## 2022-06-19 NOTE — Progress Notes (Signed)
Pt arrived to ICU alert and oriented to self, place, and situation.  Small amount of BRB noted to from rectum. VSS at  97.5 axillary 157/73  pulse of 79 NSR  Respirations of 19 and 99% RA. Pt denies pain. JP drain in place with small amount of purulent  drainage. Gauze dressing over midline surgical incision. Carolyn Erickson

## 2022-06-19 NOTE — Progress Notes (Signed)
Dr. Joesph Fillers at bedside. Bryson Corona Edd Fabian

## 2022-06-19 NOTE — Progress Notes (Signed)
PHARMACY - TOTAL PARENTERAL NUTRITION CONSULT NOTE   Indication: gastric perforation  Patient Measurements: Height: '5\' 1"'$  (154.9 cm) Weight: 57.7 kg (127 lb 3.3 oz) IBW/kg (Calculated) : 47.8 TPN AdjBW (KG): 57.9 Body mass index is 24.04 kg/m.  Assessment:  Pharmacy consulted to dose TPN in patient with gastric perforation.  Glucose / Insulin:    105-137- 3units Electrolytes: K 4.0 > 4.6, mag 1.9> 2.2  Corrected calcium 12.2 Renal: Scr 1.18 Hepatic: albumin 1.7 Tbili 4.4> 3.7 TG 144   GI Surgeries / Procedures:   Plan for repeat CT on 7/17 to evaluate for leak  Exploratory laparotomy, gastrorrhaphy  Central access: PICC double lumen 7/10 TPN start date: 7/10  Nutritional Goals: Goal TPN rate is 70 mL/hr (provides 77 g of protein and 1444 kcals per day)  RD Assessment: Kcal:  1400-1600 Protein:  75-80 gr  Fluid:  >/= 1600 ml daily  Current Nutrition:  NPO  Plan:  Continue TPN at 70 mL/hr at 1800 Electrolytes in TPN: Na 51mq/L, K 40 mEq/L, Ca 0 mEq/L, Mg 5 mEq/L, and Phos 10 mmol/L. Cl:Ac 1:1 Add 12 units inuslin in TPN  standard MVI and trace elements to TPN Continue Sensitive q6h SSI and adjust as needed  Monitor TPN labs on Mon/Thurs,   SMargot Ables PharmD Clinical Pharmacist 06/19/2022 8:46 AM

## 2022-06-19 NOTE — Progress Notes (Signed)
[facility]  H&H is down. Cross for blood pending bleeding more. Getting transferred to the ICU for monitoring. Appreciate Dr. Fredonia Highland help And discussion with family.  Curlene Labrum, MD

## 2022-06-19 NOTE — Progress Notes (Signed)
Report called and given to [place], RN in ICU. Pt transferred via bed with all belongings.

## 2022-06-19 NOTE — Plan of Care (Signed)
  Problem: Education: Goal: Knowledge of General Education information will improve Description: Including pain rating scale, medication(s)/side effects and non-pharmacologic comfort measures Outcome: Progressing   Problem: Activity: Goal: Risk for activity intolerance will decrease Outcome: Progressing   Problem: Nutrition: Goal: Adequate nutrition will be maintained Outcome: Progressing   Problem: Coping: Goal: Level of anxiety will decrease Outcome: Progressing   Problem: Pain Managment: Goal: General experience of comfort will improve Outcome: Progressing   Problem: Safety: Goal: Ability to remain free from injury will improve Outcome: Progressing   Problem: Skin Integrity: Goal: Risk for impaired skin integrity will decrease Outcome: Progressing   Problem: Coping: Goal: Ability to adjust to condition or change in health will improve Outcome: Progressing   Problem: Nutritional: Goal: Maintenance of adequate nutrition will improve Outcome: Progressing Goal: Progress toward achieving an optimal weight will improve Outcome: Progressing

## 2022-06-19 NOTE — Progress Notes (Addendum)
I was present with the medical student for this service. I personally verified the history of present illness, performed the physical exam, and made the plan for this encounter. I have verified the medical student's documentation and made modifications where appropriately. I have personally documented in my own words a brief history, physical, and plan below.     CT with continued leak, some air and contrast under the omentum but now tracking to midline. JP drain in good position, output is more serous in nature. Had IR review the imaging and no need for additional drain placement right now as the JP is in good position.  Midline wound staples removed and lower portion opened up, some blood drainage and gas evacuated, is developing a potential fistula from the wound to the leak, so contained gastrocutaneous fistula, will monitor and do wet to dry packing daily. Fascia was intact.   Tried to call husband but no answer. Left Chris a VM, will need more longterm TPN and npo except for meds (small amount applesauce for swallowing ok).  JP in place and will remain.   Looking at needing LTAC with plans for repeat CT in 2 weeks 07/03/2022. Will end her micafungin 7/18 after 2 weeks from 7/4. Will do 1 more week of unasyn given the continued leak and to ensure we have this under control. Unasyn to end 7/25.  CBC tomorrow.  TPN LTAC. Updated RN Family could not be reached will discuss further in the week  Wound order placed.  Curlene Labrum, MD [facility] [place], [place] [phone] (office)   [facility] Progress Note  15 Days Post-Op  Subjective: Patient is sleeping comfortably in bed this morning. Spoke with overnight RN who reports patient had mucousy vomit overnight and received Zofran. Patient seemed uncomfortable overnight so was given morphine. Patient's urine output is appropriate. Last stool reported to be 4  days ago.  Objective: Vital signs in last 24 hours: Temp:  [98.9 F (37.2 C)-99 F (37.2 C)] 98.9 F (37.2 C) (07/16 2057) Pulse Rate:  [86-87] 87 (07/16 2057) Resp:  [19-24] 24 (07/16 2057) BP: (144-148)/(73-83) 148/83 (07/16 2057) SpO2:  [94 %-95 %] 94 % (07/16 2057) Weight:  [57.7 kg] 57.7 kg (07/16 1403) Last BM Date : 06/14/22  Intake/Output from previous day: 07/16 0701 - 07/17 0700 In: 1936.3 [I.V.:1424.6; IV Piggyback:511.7] Out: 3370 [Urine:3325; Drains:45] Intake/Output this shift: No intake/output data recorded.  General appearance: Patient is asleep, no acute distress Resp: Regular work of breathing on room air, mild snoring Cardio: normal rate, regular rhythm, no murmurs Skin: warm and dry Incision/Wound: JP drainage light yellow fluid, mild purulence  Lab Results:  Recent Labs    06/18/22 0617 06/19/22 0606  WBC 22.1* 19.0*  HGB 10.8* 11.4*  HCT 33.8* 37.3  PLT 618* 498*   BMET Recent Labs    06/18/22 0617 06/19/22 0606  NA 144 143  K 4.0 4.6  CL 111 111  CO2 27 25  GLUCOSE 98 130*  BUN 39* 40*  CREATININE 1.21* 1.18*  CALCIUM 10.4* 10.4*   PT/INR No results for input(s): "LABPROT", "INR" in the last 72 hours.  Studies/Results: No results found.  Anti-infectives: Anti-infectives (From admission, onward)    Start     Dose/Rate Route Frequency Ordered Stop   06/16/22 0800  Ampicillin-Sulbactam (UNASYN) 3 g in sodium chloride 0.9 % 100 mL IVPB        3 g 200 mL/hr over 30  Minutes Intravenous Every 6 hours 06/16/22 0756     06/13/22 1400  Ampicillin-Sulbactam (UNASYN) 3 g in sodium chloride 0.9 % 100 mL IVPB  Status:  Discontinued        3 g 200 mL/hr over 30 Minutes Intravenous Every 8 hours 06/13/22 0821 06/13/22 0823   06/13/22 1000  Ampicillin-Sulbactam (UNASYN) 3 g in sodium chloride 0.9 % 100 mL IVPB  Status:  Discontinued        3 g 200 mL/hr over 30 Minutes Intravenous Every 12 hours 06/13/22 0823 06/16/22 0756   06/11/22 2200   piperacillin-tazobactam (ZOSYN) IVPB 3.375 g  Status:  Discontinued        3.375 g 12.5 mL/hr over 240 Minutes Intravenous Every 8 hours 06/11/22 1415 06/13/22 0821   06/11/22 1500  piperacillin-tazobactam (ZOSYN) IVPB 3.375 g        3.375 g 100 mL/hr over 30 Minutes Intravenous  Once 06/11/22 1410 06/12/22 0940   06/07/22 0200  fluconazole (DIFLUCAN) IVPB 200 mg  Status:  Discontinued        200 mg 100 mL/hr over 60 Minutes Intravenous Every 24 hours 06/06/22 0901 06/06/22 1031   06/06/22 1200  micafungin (MYCAMINE) 100 mg in sodium chloride 0.9 % 100 mL IVPB        100 mg 105 mL/hr over 1 Hours Intravenous Every 24 hours 06/06/22 1031 06/20/22 2359   06/05/22 1800  piperacillin-tazobactam (ZOSYN) IVPB 3.375 g        3.375 g 12.5 mL/hr over 240 Minutes Intravenous Every 12 hours 06/05/22 0825 06/09/22 2000   06/04/22 0600  piperacillin-tazobactam (ZOSYN) IVPB 3.375 g  Status:  Discontinued        3.375 g 12.5 mL/hr over 240 Minutes Intravenous Every 8 hours 06/04/22 0219 06/05/22 0825   06/04/22 0315  fluconazole (DIFLUCAN) IVPB 200 mg  Status:  Discontinued        200 mg 100 mL/hr over 60 Minutes Intravenous Every 24 hours 06/04/22 0217 06/04/22 0219   06/04/22 0315  fluconazole (DIFLUCAN) IVPB 200 mg  Status:  Discontinued        200 mg 100 mL/hr over 60 Minutes Intravenous Every 24 hours 06/04/22 0219 06/06/22 0901   06/03/22 2130  ceFEPIme (MAXIPIME) 2 g in sodium chloride 0.9 % 100 mL IVPB        2 g 200 mL/hr over 30 Minutes Intravenous  Once 06/03/22 2127 06/03/22 2209   06/03/22 2130  metroNIDAZOLE (FLAGYL) IVPB 500 mg  Status:  Discontinued        500 mg 100 mL/hr over 60 Minutes Intravenous Every 12 hours 06/03/22 2127 06/04/22 0217       Assessment/Plan: s/p Procedure(s): POD 15; EXPLORATORY LAPAROTOMY with omental patch   Patient is a 76 y.o. F who underwent an exploratory laparotomy with omental patch and presented with septic shock.  - PRN pain and antiemetics -  Patient on room air, regular work of breathing - Repeat CT today Monday 7/17 to evaluate for leak. Continue PPI BID - Continue TPN - NPO except for vital meds (Aricept, atorvastatin, etc.); can have sips with meds and small amount of applesauce if needed - Continue micafungin (2 week course starting from 7/4 negative blood culture) and Unasyn for contained leak. Leukocytosis trending down (19.0 today 7/17 from 22.1 7/16) - SCDs and SQ heparin - Delirium precautions - Appreciate hospitalist recommendations   LOS: 15 days   Pirapat (Max) Stanley, Medical Student 06/19/2022

## 2022-06-19 NOTE — Progress Notes (Signed)
Blood administration  A positive  [id number]   Expiration 07/17/22 [employer] # [id number]  [employer] # [id number]  Start time 1928  VS  Pulse 79  143/84 Res 19  Spo2 95 RA  Temp 98.8 Axillary  Witness Tawnya Crook RN Spoke with Dr Joesph Fillers and Dr Constance Haw ok to transfuse at 140m / hr.Order to be placed.  BBryson CoronaSEdd Fabian

## 2022-06-19 NOTE — Progress Notes (Signed)
[facility]  Discussed case with Dr. Abbey Chatters G.I. , no real role for endoscopy given the continued leak and worry for worsening that and inability to control any bleeding. Bleeding also presumed to be from ulcer, but could be from another source.   Dr. Joesph Fillers is speaking with family now. Repeat H&H at 8 PM is planned. May have to do a CTA to determine source of bleeding as a tag scan would not be avail able at night at [facility] since it is [place].   Crossed for blood pending need.  Appreciate everyone's help .  Curlene Labrum, MD

## 2022-06-19 NOTE — Plan of Care (Signed)
  Problem: Pain Managment: Goal: General experience of comfort will improve Outcome: Progressing   Problem: Safety: Goal: Ability to remain free from injury will improve Outcome: Progressing   Problem: Skin Integrity: Goal: Risk for impaired skin integrity will decrease Outcome: Progressing   

## 2022-06-19 NOTE — Progress Notes (Signed)
[facility]  Bloody BM.Likely from gastric ulcer that has not healed.  H&h check. Monitor transfuse as needed.  Appreciate Dr. Denton Brick assistance.  Curlene Labrum, MD

## 2022-06-19 NOTE — Progress Notes (Signed)
[facility]  Talked to her husband on the wound and discussed the leak, the need for JP and TPN. The midline wound and gastrocutaneous fistula and packing.   Discussed LTAC To help transition her from the hospital and continue TPN.  Curlene Labrum, MD [facility] [address] [place], [place] [address] [phone] (office)

## 2022-06-19 NOTE — Progress Notes (Signed)
Pt with 3 large bright red bloody BM within 1 hour. Scheduled sub-q Heparin held per Curlene Labrum, MD. Roxan Hockey, MD at bedside. See new orders.

## 2022-06-19 NOTE — Progress Notes (Signed)
PROGRESS NOTE  Carolyn Erickson YIF:[id number] DOB: March 26, 1946 DOA: 06/03/2022 PCP: Dettinger, Fransisca Kaufmann, MD  Brief History:  76 year old female with history of major neurocognitive disorder, hypertension, hyperlipidemia, depression, left breast cancer presenting with generalized weakness and abdominal pain.  Apparently, the patient has been having abdominal pain since April 2023.  The patient had been started on pantoprazole at that time.  There was no improvement.  She followed up with her PCP on 06/02/2022.  Referral was given to see gastroenterology.  Abdominal pain had worsened over the past 2 days prior to admission with worsening generalized weakness.  There is no fevers, chills, chest pain, shortness breath, vomiting, diarrhea.  There is no hematochezia or melena noted.  In the emergency department, the patient was hypotensive with a blood pressure of 71/54.  She was somnolent.  CT of the abdomen and pelvis was performed and showed changes consistent with a perforated gastric ulcer.  Dr. Constance Haw was consulted, and the patient was taken to the OR for exploratory laparotomy, gastrorrhaphy, omental patch.  A left IJ central line was also placed.  TRH is consulted for medical management  In the ED, the patient had low-grade fever 99.5 F.  She was tachycardic and hypotensive with blood pressure 68/53.  WBC 4.1, hemoglobin 14.6, platelets 308,000.  BMP showed sodium 137, potassium 4.1, bicarbonate 22, serum creatinine 2.14.  Postoperatively, the patient was started on Zosyn and fluconazole.  She remained on the ventilator.  Preoperative chest x-ray showed bibasilar atelectasis.  There was free air under the diaphragm.   Assessment and Plan: * Septic shock - RESOLVED Presented with hypotension, tachycardia, and lactic acid peaked at 6.8 Pt successfully weaned off norepinephrine infusion Secondary to gastric perforation with peritonitis - Patient s/p Ex lap, gastrorrhaphy and omental patch.   06/03/22 blood cultures--C. glabrata Continue IV Unasyn per ID  discontinued fluconazole per ID,  -continue echinocandin (micafungin) -Leukocytosis persist, trending down very slowly -Urine  culture NGTD -Repeat CT on  06/19/22 shows persistent Leak and Peritonitis   BRBPR -with acute on chronic anemia due to acute blood loss --Pt with multiple episodes of BRBPR after her CT Abd/Pelvis with oral and iv Contrast Hgb down to 9.5 from 11.4 earlier this morning -Monitor closely, check serial H&H and transfuse as clinically indicated -Transfer to ICU   Gastric perforation ([place]) Suspect this may be due to chronic NSAID use -Continue PPI twice daily -06/04/2022--ex laparotomy with omental patch -Postoperative care per Dr. Constance Haw -TNA therapy on hold temporarily due to need for CVL holiday -UGI 7/6 - no leak seen.  7/9 - CT c/w leak present - PICC line placed 7/10 and TPN started 7/10 -Repeat CT on  06/19/22 shows persistent Leak and Peritonitis  Nutrition----n.p.o. except for medications with spoonful of applesauce -Continue IV TPN -General surgery considering resuming oral intake after 06/19/2022 if repeat CT abdomen and pelvis failed to demonstrate any leak at that time  Fungemia 06/03/22 blood cultures--Candida glabrata -d/c fluconazole per ID consult -continue echinocandin per ID  -discussed with general surgery>>removed central line 7/4 for line holiday -repeat blood culture on 06/06/22 NGTD  Acute renal failure superimposed on stage 3a chronic kidney disease (HCC) -Serum creatinine peaked at 2.28 but trending down  -Secondary to hemodynamic changes/sepsis and hypovolemia -Creatinine much improved (currently in the 1.2 range) -renally adjust medications, avoid nephrotoxic agents / dehydration  / hypotension  Leukocytosis -- WBC peaked at 31 K -Leukocytosis persist, trending down  very slowly (currently in the 19 K range) -- appreciate ID and surgery recommendations,  -continue  antifungal and Unasyn  Hyperbilirubinemia -- secondary to persistent leak.  Continuing to monitor.   Volume overload -- treated with IV lasix 40 mg x 2 on 7/7 and resolved  Hypomagnesemia/hypokalemia resolved with replacement  Acute respiratory failure with hypoxia (HCC) Currently PRVC, RR 20, Vt 380, FiO2 0.4 -7/3 personally reviewed CXR--increased interstitial marking -7/3 ABG--7.35/36/107/20 (0.4) -7/3--extubated -7/4-5--stable on 4L [place] -7/6 - down to 2L/min [place] Oxygen weaned off  Major neurocognitive disorder/dementia/depression Patient had MoCa 17/30 on 03/14/22 -Continue donezepil, venlafaxine, bupropion    GERD (gastroesophageal reflux disease) Continue pantoprazole IV twice daily  Essential hypertension  amlodipine 2.5 mg daily  Mixed hyperlipidemia -c/n atorvastatin   Social/Ethics--- plan of care discussed with patient's husband multiple times almost on a daily basis usually at bedside.... Patient's husband accurately verbalizes understanding of the plan of care -Patient's husband asked appropriate questions -Patient remains a full code without limitations to treatment -Palliative care input appreciated  Family Communication:   Discussed with husband at bedside  consultants:  General surgery is primary service, hospitalist service is consulting  Code Status:  FULL   DVT Prophylaxis:  SCDs  Procedures: As Listed in Progress Note Above  Antibiotics: Fluconazole 7/2>>7/4 Micafungin 7/4>> Zosyn 7/2>>7/11 Unasyn 7/11>>  Subjective:  06/19/22 -husband at bedside earlier, questions answered Grand-daughter Carolyn Erickson is at bed side-- -Pt with multiple episodes of BRBPR after her CT Abd/Pelvis with oral and iv Contrast No chest pain, no emesis  No significant Abd Pains  Objective: Vitals:   06/19/22 1310 06/19/22 1602 06/19/22 1655 06/19/22 1700  BP: (!) 156/84 (!) 147/76 (!) 157/73 (!) 162/70  Pulse: 92 85    Resp: 16 18 (!) 22 20  Temp: 98.4 F (36.9  C) 98.7 F (37.1 C) (!) 97.5 F (36.4 C)   TempSrc:   Axillary   SpO2: 93% 93% 99%   Weight:    57.7 kg  Height:    '5\' 1"'$  (1.549 m)    Intake/Output Summary (Last 24 hours) at 06/19/2022 1723 Last data filed at 06/19/2022 1300 Gross per 24 hour  Intake 939.79 ml  Output 1895 ml  Net -955.21 ml   Physical Exam  Gen:- Awake Alert, in no acute distress  HEENT:- [place].AT, No sclera icterus, missing teeth Ears-HOH Neck-Supple Neck,No JVD,.  Lungs-   fair air movement bilaterally , no wheezing CV- S1, S2 normal, RRR Abd-  +ve B.Sounds, Abd Soft, mild abdominal discomfort with palpation, JP drain with purulent drainage,  dressing in situ -extremity/Skin:- good pedal pulses  Psych-affect is appropriate, oriented x3 Neuro-Generalized weakness, no new focal deficits, no tremors MSK-right arm PICC line  Data Reviewed: I have personally reviewed following labs and imaging studies  Basic Metabolic Panel: Recent Labs  Lab 06/13/22 0531 06/15/22 0606 06/17/22 0546 06/18/22 0617 06/19/22 0606  NA 141 142 145 144 143  K 3.5 4.5 4.8 4.0 4.6  CL 107 109 111 111 111  CO2 '29 29 29 27 25  '$ GLUCOSE 174* 182* 146* 98 130*  BUN 37* 34* 40* 39* 40*  CREATININE 1.37* 1.13* 1.23* 1.21* 1.18*  CALCIUM 9.2 10.2 10.7* 10.4* 10.4*  MG 2.3 1.8  --  1.9 2.2  PHOS 3.5 2.7  --  3.7 4.0   Liver Function Tests: Recent Labs  Lab 06/13/22 0531 06/15/22 0606 06/17/22 0546 06/19/22 0606  AST 27 35 54* 58*  ALT 18 25 41 46*  ALKPHOS 92 101 181* 203*  BILITOT 4.6* 3.9* 4.4* 3.7*  PROT 4.7* 5.3* 6.2* 6.3*  ALBUMIN <1.5* 1.6* 1.7* 1.7*   No results for input(s): "LIPASE", "AMYLASE" in the last 168 hours.  No results for input(s): "AMMONIA" in the last 168 hours. Coagulation Profile: Recent Labs  Lab 06/19/22 1613  INR 1.0   CBC: Recent Labs  Lab 06/13/22 0531 06/14/22 0608 06/15/22 0606 06/16/22 0609 06/17/22 0546 06/18/22 0617 06/19/22 0606 06/19/22 1613  WBC 28.3* 25.5* 25.9*  24.4* 21.3* 22.1* 19.0*  --   NEUTROABS 24.7* 22.4* 21.8* 20.1*  --   --   --   --   HGB 11.4* 11.9* 11.3* 11.3* 11.4* 10.8* 11.4* 9.5*  HCT 35.4* 36.3 34.5* 34.5* 35.7* 33.8* 37.3 29.1*  MCV 92.4 92.4 92.7 93.0 93.0 93.6 95.6  --   PLT 304 354 475* 527* 628* 618* 498*  --    Cardiac Enzymes: No results for input(s): "CKTOTAL", "CKMB", "CKMBINDEX", "TROPONINI" in the last 168 hours. BNP: Invalid input(s): "POCBNP" CBG: Recent Labs  Lab 06/18/22 1224 06/18/22 1826 06/19/22 0101 06/19/22 0550 06/19/22 1139  GLUCAP 122* 105* 137* 129* 123*   HbA1C: No results for input(s): "HGBA1C" in the last 72 hours. Urine analysis:    Component Value Date/Time   COLORURINE AMBER (A) 06/04/2022 0520   APPEARANCEUR CLOUDY (A) 06/04/2022 0520   APPEARANCEUR Cloudy (A) 04/13/2021 1530   LABSPEC 1.027 06/04/2022 0520   PHURINE 5.0 06/04/2022 0520   GLUCOSEU NEGATIVE 06/04/2022 0520   HGBUR NEGATIVE 06/04/2022 0520   BILIRUBINUR NEGATIVE 06/04/2022 0520   BILIRUBINUR Negative 04/13/2021 1530   KETONESUR NEGATIVE 06/04/2022 0520   PROTEINUR 100 (A) 06/04/2022 0520   NITRITE NEGATIVE 06/04/2022 0520   LEUKOCYTESUR SMALL (A) 06/04/2022 0520    No results found for this or any previous visit (from the past 240 hour(s)).    Scheduled Meds:  sodium chloride   Intravenous Once   amLODipine  2.5 mg Oral Daily   atorvastatin  20 mg Oral QPM   buPROPion  150 mg Oral BID   Chlorhexidine Gluconate Cloth  6 each Topical Daily   donepezil  10 mg Oral QHS   heparin  5,000 Units Subcutaneous Q8H   insulin aspart  0-9 Units Subcutaneous Q6H   mouth rinse  15 mL Mouth Rinse Q4H   pantoprazole (PROTONIX) IV  40 mg Intravenous Q12H   sodium chloride flush  10-40 mL Intracatheter Q12H   venlafaxine  37.5 mg Oral BID   Continuous Infusions:  ampicillin-sulbactam (UNASYN) IV 3 g (06/19/22 1543)   micafungin (MYCAMINE) 100 mg in sodium chloride 0.9 % 100 mL IVPB 100 mg (06/19/22 1342)   TPN ADULT  (ION) 70 mL/hr at 06/19/22 0301   TPN ADULT (ION)      Procedures/Studies: CT ABDOMEN PELVIS W CONTRAST  Result Date: 06/19/2022 CLINICAL DATA:  Peritonitis, gastric perforation, status post laparotomy and omental patch, contained leak evaluate for continued leak and abscess formation EXAM: CT ABDOMEN AND PELVIS WITH CONTRAST TECHNIQUE: Multidetector CT imaging of the abdomen and pelvis was performed using the standard protocol following bolus administration of intravenous contrast. RADIATION DOSE REDUCTION: This exam was performed according to the departmental dose-optimization program which includes automated exposure control, adjustment of the mA and/or kV according to patient size and/or use of iterative reconstruction technique. CONTRAST:  35m OMNIPAQUE IOHEXOL 300 MG/ML SOLN, additional oral enteric contrast COMPARISON:  06/11/2022 FINDINGS: Lower chest: Moderate bilateral pleural effusions and associated atelectasis or consolidation,  similar to prior examination. Cardiomegaly. Coronary artery calcifications. Hepatobiliary: No solid liver abnormality is seen. No gallstones, gallbladder wall thickening, or biliary dilatation. Pancreas: Unremarkable. No pancreatic ductal dilatation or surrounding inflammatory changes. Spleen: Normal in size without significant abnormality. Adrenals/Urinary Tract: Adrenal glands are unremarkable. Kidneys are normal, without renal calculi, solid lesion, or hydronephrosis. Bladder is unremarkable. Stomach/Bowel: Mucosal thickening of the gastric body and antrum. Persistent air and fluid collection in the ventral abdomen overlying the gastric antrum containing enteric contrast and communicating with the ventral gastric antrum, measuring 6.1 x 2.3 cm (series 2, image 37). This collection may further communicate to midline laparotomy incision, which contains air loculations (series 2, image 37). Appendix appears normal. No evidence of bowel wall thickening, distention, or  inflammatory changes. Vascular/Lymphatic: Aortic atherosclerosis. No enlarged abdominal or pelvic lymph nodes. Reproductive: Status post hysterectomy. Other: Severe anasarca. Status post midline laparotomy, as above containing air loculations within the incision (series 2, image 35). Small volume perihepatic and perisplenic ascites, similar to prior examination, however now with clear, diffuse peritoneal thickening and hyperenhancement (series 2, image 23). Fluid loculation in the central small bowel mesentery measuring 3.9 x 2.0 cm (series 2, image 56) well as in the low pelvis measuring 3.5 x 3.5 cm (series 2, image 65). Surgical drain about the ventral abdomen. Musculoskeletal: No acute or significant osseous findings. IMPRESSION: 1. Persistent air and fluid collection in the ventral abdomen overlying the gastric antrum containing enteric contrast and communicating with the ventral gastric antrum, measuring 6.1 x 2.3 cm. This collection may further communicate to midline laparotomy incision, which contains air loculations. 2. Small volume perihepatic and perisplenic ascites, similar to prior examination, however now with clear, diffuse peritoneal thickening and hyperenhancement. Additional fluid collections in the central small bowel mesentery and low pelvis. Findings are consistent with peritonitis, and the presence or absence of infection within these fluid collections is not established by CT. 3. Moderate bilateral pleural effusions and associated atelectasis or consolidation, similar to prior examination. 4. Severe anasarca. 5. Coronary artery disease. Aortic Atherosclerosis (ICD10-I70.0). Electronically Signed   By: Delanna Ahmadi M.D.   On: 06/19/2022 11:55   US Abdomen Limited RUQ (LIVER/GB)  Result Date: 06/12/2022 CLINICAL DATA:  Hyperbilirubinemia EXAM: ULTRASOUND ABDOMEN LIMITED RIGHT UPPER QUADRANT COMPARISON:  CT abdomen and pelvis 06/11/2022 FINDINGS: Gallbladder: Sludge within gallbladder. No  definite shadowing calculi. Mild gallbladder wall thickening. No sonographic Murphy sign. Common bile duct: Diameter: 3 mm, normal Liver: Normal echogenicity without mass or nodularity. No intrahepatic biliary dilatation. Portal vein is patent on color Doppler imaging with normal direction of blood flow towards the liver. Other: Perihepatic ascites identified, containing septations/loculations. IMPRESSION: Sludge within gallbladder with mild gallbladder wall thickening, a nonspecific finding in the setting of ascites. No biliary dilatation or focal hepatic abnormality. Electronically Signed   By: Lavonia Dana M.D.   On: 06/12/2022 14:40   [place] EKG SITE RITE  Result Date: 06/11/2022 If Site Rite image not attached, placement could not be confirmed due to current cardiac rhythm.  CT ABDOMEN PELVIS WO CONTRAST  Result Date: 06/11/2022 CLINICAL DATA:  EXPLORATORY LAPAROTOMY, omental patchImpression: Worsening leukocytosis. EXAM: CT ABDOMEN AND PELVIS WITHOUT CONTRAST TECHNIQUE: Multidetector CT imaging of the abdomen and pelvis was performed following the standard protocol without IV contrast. RADIATION DOSE REDUCTION: This exam was performed according to the departmental dose-optimization program which includes automated exposure control, adjustment of the mA and/or kV according to patient size and/or use of iterative reconstruction technique. COMPARISON:  06/03/2022. FINDINGS:  Lower chest: Moderate pleural effusions. Dependent lower lobe and right middle lobe opacity, likely atelectasis. Additional ground-glass opacities are noted, most evident in the right middle lobe, suspicious for infection. Effusions are increased on the left and new on the right since the prior CT. Lung base opacities are mostly new. Hepatobiliary: No focal liver abnormality is seen. No gallstones, gallbladder wall thickening, or biliary dilatation. Pancreas: Unremarkable. No pancreatic ductal dilatation or surrounding inflammatory changes.  Spleen: Normal in size without focal abnormality. Adrenals/Urinary Tract: No adrenal masses. No renal masses or hydronephrosis. Bladder is unremarkable. Stomach/Bowel: Stomach is mostly decompressed. There is an apparent defect along the anterior aspect of the mid to distal stomach through which contrast and air has collected. The ill-defined collection of contrast and air lies in the mid to upper abdomen near the midline, with an adjacent surgical drain, and deep to the midline laparotomy incision. Poorly defined small bowel loops in the central abdomen. There is no colon or small bowel dilation to suggest obstruction. No colonic wall thickening or convincing inflammation. Vascular/Lymphatic: Aortic atherosclerosis. Prominent lymph nodes noted along the gastrohepatic ligament. There are no enlarged lymph nodes. Reproductive: Status post hysterectomy. No adnexal masses. Other: Small amount of ascites. Generalized increased attenuation of the mesenteric/peritoneal fat consistent with edema. Diffuse subcutaneous soft tissue edema. Musculoskeletal: No acute finding. IMPRESSION: 1. There is no defined fluid collection to indicate an abscess. 2. There is extraluminal air and contrast in the anterior central to upper abdomen, that appears to be arising from a defect along the anterior gastric wall. The extraluminal contrast and air lies adjacent to the surgical drain. 3. Small amount of ascites is similar to the previous CT. 4. Mesenteric/peritoneal and diffuse subcutaneous edema. 5. Moderate pleural effusions with associated lung base atelectasis. Additional areas of ground-glass lung opacity are suspicious for multifocal pneumonia. Electronically Signed   By: Lajean Manes M.D.   On: 06/11/2022 12:56   DG CHEST PORT 1 VIEW  Result Date: 06/09/2022 CLINICAL DATA:  Weakness, wheezing EXAM: PORTABLE CHEST 1 VIEW COMPARISON:  06/05/2022 FINDINGS: Interval endotracheal and esophagogastric extubation. Small to moderate  bilateral pleural effusions, increased compared to prior examination, with associated atelectasis or consolidation and diffuse bilateral interstitial pulmonary opacity. Cardiomegaly. IMPRESSION: 1. Small to moderate bilateral pleural effusions, increased compared to prior examination, with associated atelectasis or consolidation and diffuse bilateral interstitial pulmonary opacity. Findings are most consistent with worsened edema. 2. Interval endotracheal and esophagogastric extubation. 3. Cardiomegaly. Electronically Signed   By: Delanna Ahmadi M.D.   On: 06/09/2022 12:25   DG UGI W SMALL BOWEL  Result Date: 06/08/2022 CLINICAL DATA:  Post repair of 1.5 cm diameter perforated anterior wall gastric antral ulcer EXAM: WATER SOLUBLE UPPER GI SERIES TECHNIQUE: Single-column upper GI series was performed using water soluble contrast. Radiation Exposure Index (as provided by the fluoroscopic device): 43.1 mGy Kerma CONTRAST:  100 cc Omnipaque 300 via NG tube COMPARISON:  CT abdomen and pelvis 06/03/2022 FLUOROSCOPY: Fluoroscopy Time:  2 minutes 36 seconds Radiation Exposure Index (if provided by the fluoroscopic device): 43.1 Number of Acquired Spot Images: Multiple fluoroscopic screen captures and cine series FINDINGS: Contrast administered via NG tube opacifies the gastric lumen. Patent pylorus with passage of contrast through normal appearing duodenal C loop into proximal jejunum with incidentally noted duodenal diverticulum at second portion. Irregularity of rugal folds with thickening and distortion at anterior wall of gastric antrum at site of perforation repair. The third portion of the duodenum is superimposed with the inferior  wall of the gastric antrum, mildly limiting exam. A blush of contrast is seen inferior to the antrum, superimposed with the third portion of the duodenum throughout the study. This appears to move in concert with the duodenum with respiration. No contrast opacification of the JP drain.  No gross evidence of contrast leak identified though it is difficult to completely exclude a small leak at the site of repair. IMPRESSION: Distortion and thickening of rugal folds at site of antral gastric ulcer repair. No gross evidence of leak at the site of ulcer repair as discussed above. Electronically Signed   By: Lavonia Dana M.D.   On: 06/08/2022 15:32   ECHOCARDIOGRAM COMPLETE  Result Date: 06/07/2022    ECHOCARDIOGRAM REPORT   Patient Name:   Carolyn Erickson Date of Exam: 06/07/2022 Medical Rec #:  [id number]       Height:       61.0 in Accession #:    [id number]      Weight:       112.2 lb Date of Birth:  [birth year]        BSA:          1.477 m Patient Age:    76 years        BP:           116/46 mmHg Patient Gender: F               HR:           104 bpm. Exam Location:  [facility] Procedure: 2D Echo, Cardiac Doppler and Color Doppler Indications:    Bacteremia  History:        Patient has no prior history of Echocardiogram examinations.                 Signs/Symptoms:Bacteremia; Risk Factors:Hypertension and                 Dyslipidemia. Breast CA.  Sonographer:    Wenda Low Referring Phys: [id number] [place]  1. Abnormal septal motion . Left ventricular ejection fraction, by estimation, is 55 to 60%. The left ventricle has normal function. The left ventricle has no regional wall motion abnormalities. Left ventricular diastolic parameters were normal.  2. Right ventricular systolic function is normal. The right ventricular size is normal. There is mildly elevated pulmonary artery systolic pressure.  3. Moderate pleural effusion in the left lateral region.  4. The mitral valve is abnormal. Trivial mitral valve regurgitation. No evidence of mitral stenosis.  5. Thickening and calcification noted . The tricuspid valve is abnormal.  6. Some thickening in the LVOT near intervalvular fibrosa Given this and nodular calcification of PV suggest TEE if suspicion for SBE high. The aortic valve is  tricuspid. There is moderate calcification of the aortic valve. There is moderate thickening of the aortic valve. Aortic valve regurgitation is mild. Aortic valve sclerosis/calcification is present, without any evidence of aortic stenosis.  7. Nodular calcification seen on PV. The pulmonic valve was abnormal.  8. The inferior vena cava is normal in size with greater than 50% respiratory variability, suggesting right atrial pressure of 3 mmHg. FINDINGS  Left Ventricle: Abnormal septal motion. Left ventricular ejection fraction, by estimation, is 55 to 60%. The left ventricle has normal function. The left ventricle has no regional wall motion abnormalities. The left ventricular internal cavity size was normal in size. There is no left ventricular hypertrophy. Left ventricular diastolic parameters were normal. Right Ventricle: The right ventricular  size is normal. No increase in right ventricular wall thickness. Right ventricular systolic function is normal. There is mildly elevated pulmonary artery systolic pressure. The tricuspid regurgitant velocity is 3.21  m/s, and with an assumed right atrial pressure of 3 mmHg, the estimated right ventricular systolic pressure is 53.9 mmHg. Left Atrium: Left atrial size was normal in size. Right Atrium: Right atrial size was normal in size. Pericardium: There is no evidence of pericardial effusion. Mitral Valve: The mitral valve is abnormal. There is mild thickening of the mitral valve leaflet(s). There is mild calcification of the mitral valve leaflet(s). Mild mitral annular calcification. Trivial mitral valve regurgitation. No evidence of mitral valve stenosis. MV peak gradient, 3.9 mmHg. The mean mitral valve gradient is 1.0 mmHg. Tricuspid Valve: Thickening and calcification noted. The tricuspid valve is abnormal. Tricuspid valve regurgitation is trivial. No evidence of tricuspid stenosis. Aortic Valve: Some thickening in the LVOT near intervalvular fibrosa Given this and  nodular calcification of PV suggest TEE if suspicion for SBE high. The aortic valve is tricuspid. There is moderate calcification of the aortic valve. There is moderate thickening of the aortic valve. Aortic valve regurgitation is mild. Aortic valve sclerosis/calcification is present, without any evidence of aortic stenosis. Aortic valve mean gradient measures 5.0 mmHg. Aortic valve peak gradient measures 10.3 mmHg. Aortic valve area, by VTI measures 2.02 cm. Pulmonic Valve: Nodular calcification seen on PV. The pulmonic valve was abnormal. Pulmonic valve regurgitation is trivial. No evidence of pulmonic stenosis. Aorta: The aortic root is normal in size and structure. Venous: The inferior vena cava is normal in size with greater than 50% respiratory variability, suggesting right atrial pressure of 3 mmHg. IAS/Shunts: No atrial level shunt detected by color flow Doppler. Additional Comments: There is a moderate pleural effusion in the left lateral region.  LEFT VENTRICLE PLAX 2D LVIDd:         4.50 cm     Diastology LVIDs:         3.00 cm     LV e' medial:    7.29 cm/s LV PW:         0.80 cm     LV E/e' medial:  11.8 LV IVS:        0.80 cm     LV e' lateral:   9.79 cm/s LVOT diam:     1.80 cm     LV E/e' lateral: 8.8 LV SV:         55 LV SV Index:   37 LVOT Area:     2.54 cm  LV Volumes (MOD) LV vol d, MOD A2C: 43.7 ml LV vol d, MOD A4C: 52.7 ml LV vol s, MOD A2C: 17.3 ml LV vol s, MOD A4C: 23.8 ml LV SV MOD A2C:     26.4 ml LV SV MOD A4C:     52.7 ml LV SV MOD BP:      28.6 ml RIGHT VENTRICLE RV Basal diam:  3.10 cm RV Mid diam:    2.90 cm RV S prime:     14.50 cm/s TAPSE (M-mode): 3.0 cm LEFT ATRIUM             Index        RIGHT ATRIUM           Index LA diam:        3.60 cm 2.44 cm/m   RA Area:     11.50 cm LA Vol (A2C):   50.9 ml 34.45 ml/m  RA  Volume:   21.80 ml  14.76 ml/m LA Vol (A4C):   43.7 ml 29.58 ml/m LA Biplane Vol: 49.8 ml 33.71 ml/m  AORTIC VALVE                     PULMONIC VALVE AV Area  (Vmax):    1.92 cm      PV Vmax:       0.98 m/s AV Area (Vmean):   1.84 cm      PV Peak grad:  3.9 mmHg AV Area (VTI):     2.02 cm AV Vmax:           160.50 cm/s AV Vmean:          103.550 cm/s AV VTI:            0.274 m AV Peak Grad:      10.3 mmHg AV Mean Grad:      5.0 mmHg LVOT Vmax:         121.00 cm/s LVOT Vmean:        74.800 cm/s LVOT VTI:          0.218 m LVOT/AV VTI ratio: 0.79  AORTA Ao Root diam: 3.30 cm MITRAL VALVE               TRICUSPID VALVE MV Area (PHT): 6.02 cm    TR Peak grad:   41.2 mmHg MV Area VTI:   2.25 cm    TR Vmax:        321.00 cm/s MV Peak grad:  3.9 mmHg MV Mean grad:  1.0 mmHg    SHUNTS MV Vmax:       0.99 m/s    Systemic VTI:  0.22 m MV Vmean:      48.8 cm/s   Systemic Diam: 1.80 cm MV Decel Time: 126 msec MV E velocity: 86.00 cm/s MV A velocity: 72.70 cm/s MV E/A ratio:  1.18 Jenkins Rouge MD Electronically signed by Jenkins Rouge MD Signature Date/Time: 06/07/2022/10:28:36 AM    Final    DG CHEST PORT 1 VIEW  Result Date: 06/05/2022 CLINICAL DATA:  76 year old female with respiratory failure. Intubated. EXAM: PORTABLE CHEST 1 VIEW COMPARISON:  Portable chest 06/04/2022 and earlier. FINDINGS: Portable AP semi upright view at 0527 hours. Endotracheal tube tip in good position between the clavicles and carina. Stable left IJ central line and visible enteric tube. Increased left lung base and retrocardiac opacity now mostly obscuring the left hemidiaphragm. Slightly lower lung volumes overall. Mediastinal contours remain normal. No pneumothorax, pulmonary edema, or definite effusion. Negative right lung. Partially visible midline abdominal skin staples. Paucity of bowel gas in the upper abdomen. Stable left chest and axillary surgical clips. IMPRESSION: 1. Stable lines and tubes. 2. Increased left lower lobe collapse or consolidation since yesterday. Electronically Signed   By: Genevie Ann M.D.   On: 06/05/2022 08:26   DG Chest Port 1 View  Result Date: 06/04/2022 CLINICAL DATA:   Endotracheal tube, nasogastric tube and central line placement. EXAM: PORTABLE CHEST 1 VIEW COMPARISON:  June 03, 2022 FINDINGS: Since the prior study there has been interval placement of an endotracheal tube. Its distal tip is seen approximately 3.8 cm from the carina. Interval nasogastric tube placement is also seen. Its distal and extends into the gastric lumen. There is a left internal jugular venous catheter with its distal tip overlying the distal superior vena cava. This is approximately 6 mm proximal to the junction of the superior vena cava and  right atrium. The heart size and mediastinal contours are within normal limits. Mild to moderate severity atelectasis and/or infiltrate is seen within the left lung base. This represents a new finding when compared to the prior study. There is no evidence of a pleural effusion or pneumothorax. Radiopaque surgical clips are seen along the left axilla. The air seen below the right hemidiaphragm on the prior study is no longer visualized. The visualized skeletal structures are unremarkable. IMPRESSION: 1. Interval endotracheal tube, nasogastric tube and left internal jugular venous catheter placement positioning, as described above. 2. Mild to moderate severity left basilar atelectasis and/or infiltrate. Electronically Signed   By: Virgina Norfolk M.D.   On: 06/04/2022 02:47   CT Abdomen Pelvis Wo Contrast  Result Date: 06/03/2022 CLINICAL DATA:  Severe weakness for 2 days with acute abdominal pain, initial encounter EXAM: CT ABDOMEN AND PELVIS WITHOUT CONTRAST TECHNIQUE: Multidetector CT imaging of the abdomen and pelvis was performed following the standard protocol without IV contrast. RADIATION DOSE REDUCTION: This exam was performed according to the departmental dose-optimization program which includes automated exposure control, adjustment of the mA and/or kV according to patient size and/or use of iterative reconstruction technique. COMPARISON:  None  Available. FINDINGS: Lower chest: Basilar atelectatic changes are noted. Hepatobiliary: Liver is within normal limits. Gallbladder demonstrates a normal appearance. Pancreas: Unremarkable. No pancreatic ductal dilatation or surrounding inflammatory changes. Spleen: Normal in size without focal abnormality. Adrenals/Urinary Tract: Adrenal glands are within normal limits. Kidneys demonstrate tiny nonobstructing stones on right in the lower pole. No ureteral obstruction is seen. The bladder is decompressed. Stomach/Bowel: Considerable fecal material is noted within the rectal vault which may represent a focal impaction. Diverticular change of the colon is noted as well as some generalized wall thickening throughout the colon. The appendix is within normal limits. Small bowel is within normal limits. Stomach demonstrates a defect in the anterior gastric wall consistent with a perforated ulcer. There is a considerable amount of air and fluid throughout the abdomen with apparent direct communication with the gastric best seen on image number 36 of series 2 and image number 78 of series 6. Vascular/Lymphatic: Aortic atherosclerosis. No enlarged abdominal or pelvic lymph nodes. Reproductive: Status post hysterectomy. No adnexal masses. Other: Free air and free fluid similar to that described above throughout the abdomen. The bowel demonstrates some reactive wall thickening related to the fluid and air. Musculoskeletal: Degenerative changes of the lumbar spine are seen. No other bony abnormality is noted. IMPRESSION: Changes consistent with a perforated gastric ulcer anteriorly with considerable spillage of fluid and air into the abdominal cavity. Some reactive wall thickening is noted within the bowel loops secondary to these changes. Critical Value/emergent results were called by telephone at the time of interpretation on 06/03/2022 at 10:43 pm to Dr. Aletta Edouard , who verbally acknowledged these results. Electronically  Signed   By: Inez Catalina M.D.   On: 06/03/2022 22:45   DG Chest Port 1 View  Result Date: 06/03/2022 CLINICAL DATA:  Weakness. EXAM: PORTABLE CHEST 1 VIEW COMPARISON:  None Available. FINDINGS: Multiple overlying radiopaque cardiac lead wires are seen. The heart size and mediastinal contours are within normal limits. Mild, diffuse, chronic appearing increased lung markings are seen. There is no evidence of an acute infiltrate, pleural effusion or pneumothorax. Radiopaque surgical clips are seen along the lateral aspect of the left chest wall. A crescentic area of air is seen just below the right hemidiaphragm. Degenerative changes seen throughout the thoracic spine. IMPRESSION: 1.  Chronic appearing increased lung markings without evidence of acute or active cardiopulmonary disease. 2. Air seen just below the right hemidiaphragm which may represent an air filled loop of colon. Further evaluation with abdomen pelvis CT is recommended, as the presence of intra-abdominal free air cannot be excluded. Electronically Signed   By: Virgina Norfolk M.D.   On: 06/03/2022 21:22     Roxan Hockey, MD How to contact the [facility] Attending or Consulting provider [place] or covering provider during after hours [place], for this patient?  Check the care team in [facility] and look for a) attending/consulting TR[facility] provider listed and b) the [facility] team listed Log into [email or web address] and use [place]'s universal password to access. If you do not have the password, please contact the hospital operator. Locate the [facility] provider you are looking for under [facility] and page to a number that you can be directly reached. If you still have difficulty reaching the provider, please page the [facility] (Director on Call) for the Hospitalists listed on amion for assistance.   [facility]  If 7PM-7AM, please contact night-coverage [email or web address] 06/19/2022, 5:23 PM   LOS: 15 days

## 2022-06-19 NOTE — TOC Progression Note (Signed)
Transition of Care [facility]) - Progression Note    Patient Details  Name: Carolyn Erickson MRN: [id number] Date of Birth: 21-Feb-1946  [facility]) CM/SW Contact  Shade Flood, LCSW Phone Number: 06/19/2022, 12:44 PM  Clinical Narrative:     TOC following. Per MD, pt will need at least two more weeks of TPN and IV anbx. MD requesting LTAC referral. Eston Esters at [facility] to request another assessment to see if pt would be eligible for LTAC. TOC will follow.  Expected Discharge Plan: [place] Barriers to Discharge: Continued Medical Work up  Expected Discharge Plan and Services Expected Discharge Plan: [place] In-house Referral: Clinical Social Work   Post Acute Care Choice: [place] Living arrangements for the past 2 months: Single Family Home                                       Social Determinants of Health (SDOH) Interventions    Readmission Risk Interventions     No data to display

## 2022-06-19 NOTE — Progress Notes (Signed)
Nutrition Follow-up  DOCUMENTATION CODES:      INTERVENTION:  TPN per pharmacy   NUTRITION DIAGNOSIS:   Increased nutrient needs related to post-op healing as evidenced by estimated needs.  -continues  GOAL:   Patient will meet greater than or equal to 90% of their needs  -Met with TPN  MONITOR:   Labs, I & O's, Skin, Weight trends  ASSESSMENT:   Patient is a 76 yo female with gastric ulcer perforation. Status post exploratory laparotomy and omental patch. J/P drain. Gastrocutaneous fistula. Transition to LTAC discussed per surgery.  Patient remains on TPN@ 70 ml/hr which is meeting est energy/protein needs.  Repeat CT planned for today.  Vomited this morning per nursing (mucous). Last BM-7/12.  Patient is resting. No family present. Talked with nursing. RD will continue to follow.   Medications: Aricept, insulin, lipitor. Morphine 1 mg (0343). Zofran (0046)     Latest Ref Rng & Units 06/19/2022    6:06 AM 06/18/2022    6:17 AM 06/17/2022    5:46 AM  BMP  Glucose 70 - 99 mg/dL 130  98  146   BUN 8 - 23 mg/dL 40  39  40   Creatinine 0.44 - 1.00 mg/dL 1.18  1.21  1.23   Sodium 135 - 145 mmol/L 143  144  145   Potassium 3.5 - 5.1 mmol/L 4.6  4.0  4.8   Chloride 98 - 111 mmol/L 111  111  111   CO2 22 - 32 mmol/L _0 Calcium 8.9 - 10.3 mg/dL 10.4  10.4  10.7      Diet Order:   Diet Order             Diet NPO time specified Except for: Sips with Meds  Diet effective now                   EDUCATION NEEDS:   Not appropriate for education at this time  Skin:  Skin Assessment: Skin Integrity Issues: Skin Integrity Issues:: Incisions Incisions: abdomen with honeycomb dressing- JP drain 45 ml. (Per surgery-midline wound and gastrocutaneous fistula and packing.)  Last BM:  7/12  Height:   Ht Readings from Last 1 Encounters:  06/18/22 _1  (1.549 m)    Weight:   Wt Readings from Last 1 Encounters:  06/18/22 57.7 kg    Ideal Body Weight:    48 kg  BMI:  Body mass index is 24.04 kg/m.  Estimated Nutritional Needs:   Kcal:  1400-1600  Protein:  75-80  Fluid:  1600 ml daily   Colman Cater MS,RD,CSG,LDN Contact: Shea Evans

## 2022-06-19 NOTE — Progress Notes (Signed)
Physical Therapy Treatment Patient Details Name: Carolyn Erickson MRN: [id number] DOB: Feb 03, 1946 Today's Date: 06/19/2022   History of Present Illness Carolyn Erickson is a 76 yo with a history of HTN and depression who comes in with weakness and abdominal pain. She has been having some pain generalized for months being treated for gastritis/ ulcer with PPI by PCP and referred to GI. She has not seen GI. She has a history of diarrhea and constipation. She says over the last few days she was feeling weaker and her pain because worse and more severe acutely. She denies any chest pain or SOB. She has no fever. She has been hypotensive and required fluid bolus and is on levophed at 2 in the ED.    PT Comments    Patient presents supine in bed and consents to PT treatment session. Patient is min/mod assist with bed mobility. Requires HOB elevated with UE assist to reach sitting position. Slow labored movement with fair carryover. Decreased trunk mobility/control with needing assist to scoot to EOB. Demonstrates significant posterior leaning. Excessive bleeding noted in purewick cannister. Treatment session stopped with nursing staff notified to assist patient. Patient left in room with nursing attending to patient. Patient will benefit from continued skilled physical therapy in hospital and recommended venue below to increase strength, balance, endurance for safe ADLs and gait.    Recommendations for follow up therapy are one component of a multi-disciplinary discharge planning process, led by the attending physician.  Recommendations may be updated based on patient status, additional functional criteria and insurance authorization.  Follow Up Recommendations  Skilled nursing-short term rehab (<3 hours/day) Can patient physically be transported by private vehicle: Yes   Assistance Recommended at Discharge Intermittent Supervision/Assistance  Patient can return home with the following A lot of help with  walking and/or transfers;Help with stairs or ramp for entrance;A lot of help with bathing/dressing/bathroom;Assistance with cooking/housework   Equipment Recommendations  None recommended by PT    Recommendations for Other Services       Precautions / Restrictions Precautions Precautions: Fall Restrictions Weight Bearing Restrictions: No     Mobility  Bed Mobility Overal bed mobility: Needs Assistance Bed Mobility: Rolling, Sidelying to Sit Rolling: Min assist, Mod assist Sidelying to sit: Mod assist       General bed mobility comments: Min/mod assist with bed mobility. Requires HOB elevated with UE assist provided to reach sitting positoin. Slow labored movement with fair carryover. Decreased trunk mobility/control with needing assist to scoot to EOB.    Transfers                        Ambulation/Gait                   Stairs             Wheelchair Mobility    Modified Rankin (Stroke Patients Only)       Balance Overall balance assessment: Needs assistance Sitting-balance support: Feet supported, No upper extremity supported Sitting balance-Leahy Scale: Poor Sitting balance - Comments: fair/poor seated at EOB                                    Cognition Arousal/Alertness: Lethargic Behavior During Therapy: WFL for tasks assessed/performed, Anxious Overall Cognitive Status: Within Functional Limits for tasks assessed  Exercises      General Comments        Pertinent Vitals/Pain Pain Assessment Pain Assessment: No/denies pain    Home Living                          Prior Function            PT Goals (current goals can now be found in the care plan section) Acute Rehab PT Goals Patient Stated Goal: return home PT Goal Formulation: With patient/family Time For Goal Achievement: 06/21/22 Potential to Achieve Goals: Good Progress towards  PT goals: Progressing toward goals    Frequency    Min 3X/week      PT Plan Current plan remains appropriate    Co-evaluation              AM-PAC PT "6 Clicks" Mobility   Outcome Measure  Help needed turning from your back to your side while in a flat bed without using bedrails?: A Lot Help needed moving from lying on your back to sitting on the side of a flat bed without using bedrails?: A Lot Help needed moving to and from a bed to a chair (including a wheelchair)?: A Lot Help needed standing up from a chair using your arms (e.g., wheelchair or bedside chair)?: A Lot Help needed to walk in hospital room?: A Lot Help needed climbing 3-5 steps with a railing? : Total 6 Click Score: 11    End of Session   Activity Tolerance: Patient tolerated treatment well;Patient limited by fatigue Patient left: in chair;with call bell/phone within reach;with family/visitor present Nurse Communication: Mobility status PT Visit Diagnosis: Unsteadiness on feet (R26.81);Other abnormalities of gait and mobility (R26.89);Muscle weakness (generalized) (M62.81)     Time: 1683-7290 PT Time Calculation (min) (ACUTE ONLY): 12 min  Charges:  $Therapeutic Activity: 8-22 mins                     3:52 PM, 06/19/22 Lestine Box, S/PT

## 2022-06-19 NOTE — Progress Notes (Signed)
Blood transfusion completed with no adverse effects or transfusion reactions. 300 mL transfused. Vital signs remained stable throughout transfusion.

## 2022-06-20 DIAGNOSIS — A419 Sepsis, unspecified organism: Secondary | ICD-10-CM | POA: Diagnosis not present

## 2022-06-20 DIAGNOSIS — R6521 Severe sepsis with septic shock: Secondary | ICD-10-CM | POA: Diagnosis not present

## 2022-06-20 LAB — COMPREHENSIVE METABOLIC PANEL
ALT: 37 U/L (ref 0–44)
AST: 41 U/L (ref 15–41)
Albumin: <1.5 g/dL — ABNORMAL LOW (ref 3.5–5.0)
Alkaline Phosphatase: 139 U/L — ABNORMAL HIGH (ref 38–126)
Anion gap: 5 (ref 5–15)
BUN: 42 mg/dL — ABNORMAL HIGH (ref 8–23)
CO2: 26 mmol/L (ref 22–32)
Calcium: 9.9 mg/dL (ref 8.9–10.3)
Chloride: 111 mmol/L (ref 98–111)
Creatinine, Ser: 1.36 mg/dL — ABNORMAL HIGH (ref 0.44–1.00)
GFR, Estimated: 40 mL/min — ABNORMAL LOW (ref >60)
Glucose, Bld: 133 mg/dL — ABNORMAL HIGH (ref 70–99)
Potassium: 4.1 mmol/L (ref 3.5–5.1)
Sodium: 142 mmol/L (ref 135–145)
Total Bilirubin: 2.7 mg/dL — ABNORMAL HIGH (ref 0.3–1.2)
Total Protein: 5.6 g/dL — ABNORMAL LOW (ref 6.5–8.1)

## 2022-06-20 LAB — CBC WITH DIFFERENTIAL/PLATELET
Abs Immature Granulocytes: 0.14 10*3/uL — ABNORMAL HIGH (ref 0.00–0.07)
Basophils Absolute: 0.1 10*3/uL (ref 0.0–0.1)
Basophils Relative: 0 %
Eosinophils Absolute: 0.3 10*3/uL (ref 0.0–0.5)
Eosinophils Relative: 2 %
HCT: 28.6 % — ABNORMAL LOW (ref 36.0–46.0)
Hemoglobin: 9.2 g/dL — ABNORMAL LOW (ref 12.0–15.0)
Immature Granulocytes: 1 %
Lymphocytes Relative: 12 %
Lymphs Abs: 2.1 10*3/uL (ref 0.7–4.0)
MCH: 29.8 pg (ref 26.0–34.0)
MCHC: 32.2 g/dL (ref 30.0–36.0)
MCV: 92.6 fL (ref 80.0–100.0)
Monocytes Absolute: 1.9 10*3/uL — ABNORMAL HIGH (ref 0.1–1.0)
Monocytes Relative: 10 %
Neutro Abs: 13.8 10*3/uL — ABNORMAL HIGH (ref 1.7–7.7)
Neutrophils Relative %: 75 %
Platelets: 465 10*3/uL — ABNORMAL HIGH (ref 150–400)
RBC: 3.09 MIL/uL — ABNORMAL LOW (ref 3.87–5.11)
RDW: 15.9 % — ABNORMAL HIGH (ref 11.5–15.5)
WBC: 18.3 10*3/uL — ABNORMAL HIGH (ref 4.0–10.5)
nRBC: 0 % (ref 0.0–0.2)

## 2022-06-20 LAB — GLUCOSE, CAPILLARY
Glucose-Capillary: 117 mg/dL — ABNORMAL HIGH (ref 70–99)
Glucose-Capillary: 128 mg/dL — ABNORMAL HIGH (ref 70–99)
Glucose-Capillary: 129 mg/dL — ABNORMAL HIGH (ref 70–99)
Glucose-Capillary: 132 mg/dL — ABNORMAL HIGH (ref 70–99)

## 2022-06-20 MED ORDER — SODIUM CHLORIDE 0.9 % IV SOLN
3.0000 g | Freq: Two times a day (BID) | INTRAVENOUS | Status: DC
  Administered 2022-06-20 – 2022-06-24 (×8): 3 g via INTRAVENOUS
  Filled 2022-06-20: qty 3
  Filled 2022-06-20: qty 8
  Filled 2022-06-20: qty 3
  Filled 2022-06-20 (×7): qty 8

## 2022-06-20 MED ORDER — TRAVASOL 10 % IV SOLN
INTRAVENOUS | Status: AC
  Filled 2022-06-20: qty 772.8

## 2022-06-20 NOTE — Progress Notes (Addendum)
I was present with the medical student for this service. I personally verified the history of present illness, performed the physical exam, and made the plan for this encounter. I have verified the medical student's documentation and made modifications where appropriately. I have personally documented in my own words a brief history, physical, and plan below.     No more bloody Bms. Received 1 uRBC. Doing better, hemodynamics are stable. H&H Stable X 3.  Discussed with son and daughter at bedside. Discussed with Dr. Denton Brick. Will get her stabilized  and no more bloody Bms and then will still plan for LTAC end of week.  Can have meds with sips and apple sauce if needed Midline wound packing daily TPN Micafugnin ends today, unasyn for 7 more days  DNR now Discussed that there could be additional set backs and up and downs.  Plan to reimage 7/31 after further time to heal   Curlene Labrum, MD [facility] [place], [place] [address] [phone] (office)   [facility] Progress Note  16 Days Post-Op  Subjective: Patient is awake, alert, and interactive this morning. She is not in acute distress and states she is feeling "all right". She acknowledges no particular concerns at this time. Husband in room with her. RN reports no bloody BM today. JP drainage light yellow fluid, mild purulence.  Objective: Vital signs in last 24 hours: Temp:  [97.4 F (36.3 C)-98.8 F (37.1 C)] 97.4 F (36.3 C) (07/18 0733) Pulse Rate:  [74-92] 87 (07/18 0600) Resp:  [16-26] 22 (07/18 0600) BP: (130-162)/(62-84) 155/77 (07/18 0500) SpO2:  [93 %-100 %] 95 % (07/18 0600) Weight:  [56.8 kg-57.7 kg] 56.8 kg (07/18 0500) Last BM Date : 06/19/22  Intake/Output from previous day: 07/17 0701 - 07/18 0700 In: 2326.3 [I.V.:1711.3; IV Piggyback:615] Out: 50 [Drains:50] Intake/Output this shift: Total I/O In: -  Out: 30 [Drains:30]  General  appearance: Patient is alert, cooperative, interactive, not in acute distress Resp: Regular work of breathing on room air, CTAB Cardio: Normal rate, regular rhythm, no mumurs Skin: warm, dry Incision/Wound: JP drainage light yellow fluid, mild purulence. Abdominal surgical wound with staples out, inferior section of wound open for saline gauze packing. Wound appears clean, old saline packed gauze with brown/dark red stains  Lab Results:  Recent Labs    06/19/22 1848 06/19/22 2300 06/20/22 0409  WBC 19.0*  --  18.3*  HGB 10.0* 9.4* 9.2*  HCT 31.1* 28.6* 28.6*  PLT 562*  --  465*   BMET Recent Labs    06/19/22 0606 06/20/22 0409  NA 143 142  K 4.6 4.1  CL 111 111  CO2 25 26  GLUCOSE 130* 133*  BUN 40* 42*  CREATININE 1.18* 1.36*  CALCIUM 10.4* 9.9   PT/INR Recent Labs    06/19/22 1613  LABPROT 13.3  INR 1.0    Studies/Results: CT ABDOMEN PELVIS W CONTRAST  Result Date: 06/19/2022 CLINICAL DATA:  Peritonitis, gastric perforation, status post laparotomy and omental patch, contained leak evaluate for continued leak and abscess formation EXAM: CT ABDOMEN AND PELVIS WITH CONTRAST TECHNIQUE: Multidetector CT imaging of the abdomen and pelvis was performed using the standard protocol following bolus administration of intravenous contrast. RADIATION DOSE REDUCTION: This exam was performed according to the departmental dose-optimization program which includes automated exposure control, adjustment of the mA and/or kV according to patient size and/or use of iterative reconstruction technique. CONTRAST:  39m OMNIPAQUE IOHEXOL 300 MG/ML SOLN, additional oral enteric  contrast COMPARISON:  06/11/2022 FINDINGS: Lower chest: Moderate bilateral pleural effusions and associated atelectasis or consolidation, similar to prior examination. Cardiomegaly. Coronary artery calcifications. Hepatobiliary: No solid liver abnormality is seen. No gallstones, gallbladder wall thickening, or biliary  dilatation. Pancreas: Unremarkable. No pancreatic ductal dilatation or surrounding inflammatory changes. Spleen: Normal in size without significant abnormality. Adrenals/Urinary Tract: Adrenal glands are unremarkable. Kidneys are normal, without renal calculi, solid lesion, or hydronephrosis. Bladder is unremarkable. Stomach/Bowel: Mucosal thickening of the gastric body and antrum. Persistent air and fluid collection in the ventral abdomen overlying the gastric antrum containing enteric contrast and communicating with the ventral gastric antrum, measuring 6.1 x 2.3 cm (series 2, image 37). This collection may further communicate to midline laparotomy incision, which contains air loculations (series 2, image 37). Appendix appears normal. No evidence of bowel wall thickening, distention, or inflammatory changes. Vascular/Lymphatic: Aortic atherosclerosis. No enlarged abdominal or pelvic lymph nodes. Reproductive: Status post hysterectomy. Other: Severe anasarca. Status post midline laparotomy, as above containing air loculations within the incision (series 2, image 35). Small volume perihepatic and perisplenic ascites, similar to prior examination, however now with clear, diffuse peritoneal thickening and hyperenhancement (series 2, image 23). Fluid loculation in the central small bowel mesentery measuring 3.9 x 2.0 cm (series 2, image 56) well as in the low pelvis measuring 3.5 x 3.5 cm (series 2, image 65). Surgical drain about the ventral abdomen. Musculoskeletal: No acute or significant osseous findings. IMPRESSION: 1. Persistent air and fluid collection in the ventral abdomen overlying the gastric antrum containing enteric contrast and communicating with the ventral gastric antrum, measuring 6.1 x 2.3 cm. This collection may further communicate to midline laparotomy incision, which contains air loculations. 2. Small volume perihepatic and perisplenic ascites, similar to prior examination, however now with clear,  diffuse peritoneal thickening and hyperenhancement. Additional fluid collections in the central small bowel mesentery and low pelvis. Findings are consistent with peritonitis, and the presence or absence of infection within these fluid collections is not established by CT. 3. Moderate bilateral pleural effusions and associated atelectasis or consolidation, similar to prior examination. 4. Severe anasarca. 5. Coronary artery disease. Aortic Atherosclerosis (ICD10-I70.0). Electronically Signed   By: Delanna Ahmadi M.D.   On: 06/19/2022 11:55    Anti-infectives: Anti-infectives (From admission, onward)    Start     Dose/Rate Route Frequency Ordered Stop   06/20/22 1200  Ampicillin-Sulbactam (UNASYN) 3 g in sodium chloride 0.9 % 100 mL IVPB        3 g 200 mL/hr over 30 Minutes Intravenous Every 12 hours 06/20/22 0815     06/16/22 0800  Ampicillin-Sulbactam (UNASYN) 3 g in sodium chloride 0.9 % 100 mL IVPB  Status:  Discontinued        3 g 200 mL/hr over 30 Minutes Intravenous Every 6 hours 06/16/22 0756 06/20/22 0815   06/13/22 1400  Ampicillin-Sulbactam (UNASYN) 3 g in sodium chloride 0.9 % 100 mL IVPB  Status:  Discontinued        3 g 200 mL/hr over 30 Minutes Intravenous Every 8 hours 06/13/22 0821 06/13/22 0823   06/13/22 1000  Ampicillin-Sulbactam (UNASYN) 3 g in sodium chloride 0.9 % 100 mL IVPB  Status:  Discontinued        3 g 200 mL/hr over 30 Minutes Intravenous Every 12 hours 06/13/22 0823 06/16/22 0756   06/11/22 2200  piperacillin-tazobactam (ZOSYN) IVPB 3.375 g  Status:  Discontinued        3.375 g 12.5 mL/hr over 240 Minutes  Intravenous Every 8 hours 06/11/22 1415 06/13/22 0821   06/11/22 1500  piperacillin-tazobactam (ZOSYN) IVPB 3.375 g        3.375 g 100 mL/hr over 30 Minutes Intravenous  Once 06/11/22 1410 06/12/22 0940   06/07/22 0200  fluconazole (DIFLUCAN) IVPB 200 mg  Status:  Discontinued        200 mg 100 mL/hr over 60 Minutes Intravenous Every 24 hours 06/06/22 0901  06/06/22 1031   06/06/22 1200  micafungin (MYCAMINE) 100 mg in sodium chloride 0.9 % 100 mL IVPB        100 mg 105 mL/hr over 1 Hours Intravenous Every 24 hours 06/06/22 1031 06/20/22 2359   06/05/22 1800  piperacillin-tazobactam (ZOSYN) IVPB 3.375 g        3.375 g 12.5 mL/hr over 240 Minutes Intravenous Every 12 hours 06/05/22 0825 06/09/22 2000   06/04/22 0600  piperacillin-tazobactam (ZOSYN) IVPB 3.375 g  Status:  Discontinued        3.375 g 12.5 mL/hr over 240 Minutes Intravenous Every 8 hours 06/04/22 0219 06/05/22 0825   06/04/22 0315  fluconazole (DIFLUCAN) IVPB 200 mg  Status:  Discontinued        200 mg 100 mL/hr over 60 Minutes Intravenous Every 24 hours 06/04/22 0217 06/04/22 0219   06/04/22 0315  fluconazole (DIFLUCAN) IVPB 200 mg  Status:  Discontinued        200 mg 100 mL/hr over 60 Minutes Intravenous Every 24 hours 06/04/22 0219 06/06/22 0901   06/03/22 2130  ceFEPIme (MAXIPIME) 2 g in sodium chloride 0.9 % 100 mL IVPB        2 g 200 mL/hr over 30 Minutes Intravenous  Once 06/03/22 2127 06/03/22 2209   06/03/22 2130  metroNIDAZOLE (FLAGYL) IVPB 500 mg  Status:  Discontinued        500 mg 100 mL/hr over 60 Minutes Intravenous Every 12 hours 06/03/22 2127 06/04/22 0217       Assessment/Plan: s/p Procedure(s): POD 16; EXPLORATORY LAPAROTOMY with omental patch   Patient is a 76 y.o. F who underwent an exploratory laparotomy with omental patch and presented with septic shock.   - PRN pain and antiemetics - Patient on room air, regular work of breathing - Patient transferred to ICU and required 300 mL blood transfusion yesterday 7/17 due to low Hgb (9.5 on 7/17). Low Hgb and bloody BM yesterday could be form gastric ulcer that has not healed. Continue to monitor and transfuse as necessary - Repeat CT Monday 7/17 showed continued leak with some air and contrast under the omentum tracking to midline. Plan for repeat CT in 2 weeks 07/03/22 - Surgical wound clean and staples  out with inferior section of wound open, packed with saline gauze. Dressing changed today - previous gauze with brown/dark red stains. Continue to monitor contained gastrocutaneous fistula - Continue TPN - NPO except for vital meds (Aricept, atorvastatin, etc.); can have sips with meds and small amount of applesauce if needed - Continue micafungin (2 week course starting from 7/4 negative blood culture, last dose will be today 7/18) and Unasyn for contained leak. Leukocytosis trending down (18.3 today 7/18 from 19.0 7/17) - SCDs - SQ heparin held due to low Hgb/bloody BM yesterday - Delirium precautions - LTAC planning - Labs in AM - Appreciate hospitalist recommendations   LOS: 16 days    Pirapat (Max) Rerkpattanapipat, Medical Student 06/20/2022

## 2022-06-20 NOTE — Progress Notes (Signed)
PHARMACY - TOTAL PARENTERAL NUTRITION CONSULT NOTE   Indication: gastric perforation  Patient Measurements: Height: '5\' 1"'$  (154.9 cm) Weight: 56.8 kg (125 lb 3.5 oz) IBW/kg (Calculated) : 47.8 TPN AdjBW (KG): 57.7 Body mass index is 23.66 kg/m.  Assessment:  Pharmacy consulted to dose TPN in patient with gastric perforation.  Glucose / Insulin:    85-133- 2 units Electrolytes: K 4.1, mag 1.9> 2.2  Corrected calcium 11.9 Renal: Scr 1.18> 1.36 Hepatic: albumin 1.7 Tbili 4.4> 3.7> 2.7 TG 144   GI Surgeries / Procedures:   Repeat CT on  06/19/22 shows persistent Leak and Peritonitis  Exploratory laparotomy, gastrorrhaphy  Central access: PICC double lumen 7/10 TPN start date: 7/10  Nutritional Goals: Goal TPN rate is 70 mL/hr (provides 77 g of protein and 1444 kcals per day)  RD Assessment: Kcal:  1400-1600 Protein:  75-80 gr  Fluid:  >/= 1600 ml daily  Current Nutrition:  NPO  Plan:  Continue TPN at 70 mL/hr at 1800 Electrolytes in TPN: Na 56mq/L, K 40 mEq/L, Ca 0 mEq/L, Mg 5 mEq/L, and Phos 10 mmol/L. Cl:Ac 1:1 Add 12 units inuslin in TPN  standard MVI and trace elements to TPN Continue Sensitive q6h SSI and adjust as needed  Monitor TPN labs on Mon/Thurs,   SMargot Ables PharmD Clinical Pharmacist 06/20/2022 8:07 AM

## 2022-06-20 NOTE — TOC Progression Note (Signed)
Transition of Care [facility]) - Progression Note    Patient Details  Name: Carolyn Erickson MRN: [id number] Date of Birth: [birth year]  Transition of Care [facility]) CM/SW [place], [place] Phone Number: 06/20/2022, 10:51 AM  Clinical Narrative:    TOC following for discharge planning. Per MD if pt is stable after 48 hours can pursue LTAC. CSW updated Jaquelyn Bitter with [facility] of this. TOC to follow.   Expected Discharge Plan: [place] Barriers to Discharge: Continued Medical Work up  Expected Discharge Plan and Services Expected Discharge Plan: [place] In-house Referral: Clinical Social Work   Post Acute Care Choice: [place] Living arrangements for the past 2 months: Single Family Home                                       Social Determinants of Health (SDOH) Interventions    Readmission Risk Interventions     No data to display

## 2022-06-20 NOTE — Progress Notes (Signed)
PROGRESS NOTE  JHENE WESTMORELAND EQA:[id number] DOB: 15-Jul-1946 DOA: 06/03/2022 PCP: Dettinger, Fransisca Kaufmann, MD  Brief History:  76 year old female with history of major neurocognitive disorder, hypertension, hyperlipidemia, depression, left breast cancer presenting with generalized weakness and abdominal pain.  Apparently, the patient has been having abdominal pain since April 2023.  The patient had been started on pantoprazole at that time.  There was no improvement.  She followed up with her PCP on 06/02/2022.  Referral was given to see gastroenterology.  Abdominal pain had worsened over the past 2 days prior to admission with worsening generalized weakness.  There is no fevers, chills, chest pain, shortness breath, vomiting, diarrhea.  There is no hematochezia or melena noted.  In the emergency department, the patient was hypotensive with a blood pressure of 71/54.  She was somnolent.  CT of the abdomen and pelvis was performed and showed changes consistent with a perforated gastric ulcer.  Dr. Constance Haw was consulted, and the patient was taken to the OR for exploratory laparotomy, gastrorrhaphy, omental patch.  A left IJ central line was also placed.  TRH is consulted for medical management  In the ED, the patient had low-grade fever 99.5 F.  She was tachycardic and hypotensive with blood pressure 68/53.  WBC 4.1, hemoglobin 14.6, platelets 308,000.  BMP showed sodium 137, potassium 4.1, bicarbonate 22, serum creatinine 2.14.  Postoperatively, the patient was started on Zosyn and fluconazole.  She remained on the ventilator.  Preoperative chest x-ray showed bibasilar atelectasis.  There was free air under the diaphragm.   Assessment and Plan: * Septic shock - RESOLVED Presented with hypotension, tachycardia, and lactic acid peaked at 6.8 Pt successfully weaned off norepinephrine infusion Secondary to gastric perforation with peritonitis - Patient s/p Ex lap, gastrorrhaphy and omental patch.   06/03/22 blood cultures--C. glabrata Continue IV Unasyn per ID  discontinued fluconazole per ID,  -continue echinocandin (micafungin) -Leukocytosis persist, trending down very slowly -Urine  culture NGTD -Repeat CT on  06/19/22 shows persistent Leak and Peritonitis   BRBPR -with acute on chronic anemia due to acute blood loss --Pt with multiple episodes of BRBPR after her CT Abd/Pelvis with oral and iv Contrast Hgb down to 9.2 from 11.4 earlier -she did receive 1 unit of PRBC -Monitor H&H and transfuse as needed -Only 1 bright red blood per rectum in the last 8 hours or so   Gastric perforation ([place]) Suspect this may be due to chronic NSAID use -Continue PPI twice daily -06/04/2022--ex laparotomy with omental patch -Postoperative care per Dr. Constance Haw -TNA therapy on hold temporarily due to need for CVL holiday -UGI 7/6 - no leak seen.  7/9 - CT c/w leak present - PICC line placed 7/10 and TPN started 7/10 -Repeat CT on  06/19/22 shows persistent Leak and Peritonitis -Plan is to repeat another CT abdomen and pelvis with Contrast on 07/03/2022 [facility] at [facility] if patient is already discharge at that time  Nutrition----n.p.o. except for medications with spoonful of applesauce -Continue IV TPN --  Fungemia 06/03/22 blood cultures--Candida glabrata -d/c fluconazole per ID consult -continue echinocandin per ID  -discussed with general surgery>>removed central line 7/4 for line holiday -repeat blood culture on 06/06/22 NGTD  Acute renal failure superimposed on stage 3a chronic kidney disease (HCC) -Serum creatinine peaked at 2.28 but trending down  -Secondary to hemodynamic changes/sepsis and hypovolemia -Creatinine much improved (currently in the 1.2 range) -renally adjust medications, avoid nephrotoxic agents / dehydration  /  hypotension  Leukocytosis -- WBC peaked at 31 K -Leukocytosis persist, trending down very slowly (currently in the 18 K range) -- appreciate ID and surgery  recommendations,  -continue antifungal and Unasyn  Hyperbilirubinemia -- secondary to persistent leak.  Continuing to monitor.   Volume overload -- treated with IV lasix 40 mg x 2 on 7/7 and resolved  Hypomagnesemia/hypokalemia resolved with replacement  Acute respiratory failure with hypoxia (HCC) Currently PRVC, RR 20, Vt 380, FiO2 0.4 -7/3 personally reviewed CXR--increased interstitial marking -7/3 ABG--7.35/36/107/20 (0.4) -7/3--extubated -7/4-5--stable on 4L [place] -7/6 - down to 2L/min [place] Oxygen weaned off  Major neurocognitive disorder/dementia/depression Patient had MoCa 17/30 on 03/14/22 -Continue donezepil, venlafaxine, bupropion    GERD (gastroesophageal reflux disease) Continue pantoprazole IV twice daily  Essential hypertension  amlodipine 2.5 mg daily  Mixed hyperlipidemia -c/n atorvastatin   Social/Ethics--- plan of care discussed with patient's husband multiple times almost on a daily basis usually at bedside.... Patient's husband accurately verbalizes understanding of the plan of care -DNR/DNI -Overall prognosis is guarded  Dispo--LTAC around 06/22/2022 if improved/stable  Family Communication:   Discussed with husband and multiple family members at bedside  consultants:  General surgery is primary service, hospitalist service is consulting  Code Status: DNR  DVT Prophylaxis:  SCDs  Procedures: As Listed in Progress Note Above  Antibiotics: Fluconazole 7/2>>7/4 Micafungin 7/4>> Zosyn 7/2>>7/11 Unasyn 7/11>>  Subjective:  06/20/22 -Patient's son Gerald Stabs, patient's daughter [name] and patient's husband as well as Education officer, museum and grandson Clint Lipps are  at bed side-- --Only 1 episode of bright red blood per rectum in the last 8 hours -Family members confirm DNR/DNI -Questions answered  Objective: Vitals:   06/20/22 1633 06/20/22 1700 06/20/22 1800 06/20/22 1900  BP:  (!) 167/64 (!) 157/72 (!) 152/64  Pulse:  87 85 (!) 57  Resp:  (!) 25  (!) 27 (!) 21  Temp: 98.5 F (36.9 C)   98.4 F (36.9 C)  TempSrc: Axillary     SpO2:  97% 96% 96%  Weight:      Height:        Intake/Output Summary (Last 24 hours) at 06/20/2022 2028 Last data filed at 06/20/2022 0730 Gross per 24 hour  Intake 920.45 ml  Output 30 ml  Net 890.45 ml   Physical Exam  Gen:- Awake Alert, in no acute distress  HEENT:- .AT, No sclera icterus, missing teeth Ears-HOH Neck-Supple Neck,No JVD,.  Lungs-   fair air movement bilaterally , no wheezing CV- S1, S2 normal, RRR Abd-  +ve B.Sounds, Abd Soft, mild abdominal discomfort with palpation, JP drain with purulent drainage,  dressing in situ -extremity/Skin:- good pedal pulses  Psych-affect is flat,, oriented x3 Neuro-Generalized weakness, no new focal deficits, no tremors MSK-right arm PICC line  Data Reviewed: I have personally reviewed following labs and imaging studies  Basic Metabolic Panel: Recent Labs  Lab 06/15/22 0606 06/17/22 0546 06/18/22 0617 06/19/22 0606 06/20/22 0409  NA 142 145 144 143 142  K 4.5 4.8 4.0 4.6 4.1  CL 109 111 111 111 111  CO2 '29 29 27 25 26  '$ GLUCOSE 182* 146* 98 130* 133*  BUN 34* 40* 39* 40* 42*  CREATININE 1.13* 1.23* 1.21* 1.18* 1.36*  CALCIUM 10.2 10.7* 10.4* 10.4* 9.9  MG 1.8  --  1.9 2.2  --   PHOS 2.7  --  3.7 4.0  --    Liver Function Tests: Recent Labs  Lab 06/15/22 0606 06/17/22 0546 06/19/22 0606 06/20/22 0409  AST  35 54* 58* 41  ALT 25 41 46* 37  ALKPHOS 101 181* 203* 139*  BILITOT 3.9* 4.4* 3.7* 2.7*  PROT 5.3* 6.2* 6.3* 5.6*  ALBUMIN 1.6* 1.7* 1.7* <1.5*   No results for input(s): "LIPASE", "AMYLASE" in the last 168 hours.  No results for input(s): "AMMONIA" in the last 168 hours. Coagulation Profile: Recent Labs  Lab 06/19/22 1613  INR 1.0   CBC: Recent Labs  Lab 06/14/22 0608 06/15/22 0606 06/16/22 0609 06/17/22 0546 06/18/22 0617 06/19/22 0606 06/19/22 1613 06/19/22 1848 06/19/22 2300 06/20/22 0409  WBC  25.5* 25.9* 24.4* 21.3* 22.1* 19.0*  --  19.0*  --  18.3*  NEUTROABS 22.4* 21.8* 20.1*  --   --   --   --   --   --  13.8*  HGB 11.9* 11.3* 11.3* 11.4* 10.8* 11.4* 9.5* 10.0* 9.4* 9.2*  HCT 36.3 34.5* 34.5* 35.7* 33.8* 37.3 29.1* 31.1* 28.6* 28.6*  MCV 92.4 92.7 93.0 93.0 93.6 95.6  --  94.0  --  92.6  PLT 354 475* 527* 628* 618* 498*  --  562*  --  465*   Cardiac Enzymes: No results for input(s): "CKTOTAL", "CKMB", "CKMBINDEX", "TROPONINI" in the last 168 hours. BNP: Invalid input(s): "POCBNP" CBG: Recent Labs  Lab 06/19/22 1815 06/19/22 2318 06/20/22 0736 06/20/22 1144 06/20/22 1830  GLUCAP 85 129* 129* 128* 117*   HbA1C: No results for input(s): "HGBA1C" in the last 72 hours. Urine analysis:    Component Value Date/Time   COLORURINE AMBER (A) 06/04/2022 0520   APPEARANCEUR CLOUDY (A) 06/04/2022 0520   APPEARANCEUR Cloudy (A) 04/13/2021 1530   LABSPEC 1.027 06/04/2022 0520   PHURINE 5.0 06/04/2022 0520   GLUCOSEU NEGATIVE 06/04/2022 0520   HGBUR NEGATIVE 06/04/2022 0520   BILIRUBINUR NEGATIVE 06/04/2022 0520   BILIRUBINUR Negative 04/13/2021 1530   KETONESUR NEGATIVE 06/04/2022 0520   PROTEINUR 100 (A) 06/04/2022 0520   NITRITE NEGATIVE 06/04/2022 0520   LEUKOCYTESUR SMALL (A) 06/04/2022 0520    No results found for this or any previous visit (from the past 240 hour(s)).    Scheduled Meds:  sodium chloride   Intravenous Once   amLODipine  2.5 mg Oral Daily   atorvastatin  20 mg Oral QPM   buPROPion  150 mg Oral BID   Chlorhexidine Gluconate Cloth  6 each Topical Daily   donepezil  10 mg Oral QHS   insulin aspart  0-9 Units Subcutaneous Q6H   mouth rinse  15 mL Mouth Rinse Q4H   pantoprazole (PROTONIX) IV  40 mg Intravenous Q12H   sodium chloride flush  10-40 mL Intracatheter Q12H   venlafaxine  37.5 mg Oral BID   Continuous Infusions:  ampicillin-sulbactam (UNASYN) IV 3 g (06/20/22 1302)   TPN ADULT (ION) 70 mL/hr at 06/20/22 1837     Procedures/Studies: CT ABDOMEN PELVIS W CONTRAST  Result Date: 06/19/2022 CLINICAL DATA:  Peritonitis, gastric perforation, status post laparotomy and omental patch, contained leak evaluate for continued leak and abscess formation EXAM: CT ABDOMEN AND PELVIS WITH CONTRAST TECHNIQUE: Multidetector CT imaging of the abdomen and pelvis was performed using the standard protocol following bolus administration of intravenous contrast. RADIATION DOSE REDUCTION: This exam was performed according to the departmental dose-optimization program which includes automated exposure control, adjustment of the mA and/or kV according to patient size and/or use of iterative reconstruction technique. CONTRAST:  101m OMNIPAQUE IOHEXOL 300 MG/ML SOLN, additional oral enteric contrast COMPARISON:  06/11/2022 FINDINGS: Lower chest: Moderate bilateral pleural effusions and  associated atelectasis or consolidation, similar to prior examination. Cardiomegaly. Coronary artery calcifications. Hepatobiliary: No solid liver abnormality is seen. No gallstones, gallbladder wall thickening, or biliary dilatation. Pancreas: Unremarkable. No pancreatic ductal dilatation or surrounding inflammatory changes. Spleen: Normal in size without significant abnormality. Adrenals/Urinary Tract: Adrenal glands are unremarkable. Kidneys are normal, without renal calculi, solid lesion, or hydronephrosis. Bladder is unremarkable. Stomach/Bowel: Mucosal thickening of the gastric body and antrum. Persistent air and fluid collection in the ventral abdomen overlying the gastric antrum containing enteric contrast and communicating with the ventral gastric antrum, measuring 6.1 x 2.3 cm (series 2, image 37). This collection may further communicate to midline laparotomy incision, which contains air loculations (series 2, image 37). Appendix appears normal. No evidence of bowel wall thickening, distention, or inflammatory changes. Vascular/Lymphatic: Aortic  atherosclerosis. No enlarged abdominal or pelvic lymph nodes. Reproductive: Status post hysterectomy. Other: Severe anasarca. Status post midline laparotomy, as above containing air loculations within the incision (series 2, image 35). Small volume perihepatic and perisplenic ascites, similar to prior examination, however now with clear, diffuse peritoneal thickening and hyperenhancement (series 2, image 23). Fluid loculation in the central small bowel mesentery measuring 3.9 x 2.0 cm (series 2, image 56) well as in the low pelvis measuring 3.5 x 3.5 cm (series 2, image 65). Surgical drain about the ventral abdomen. Musculoskeletal: No acute or significant osseous findings. IMPRESSION: 1. Persistent air and fluid collection in the ventral abdomen overlying the gastric antrum containing enteric contrast and communicating with the ventral gastric antrum, measuring 6.1 x 2.3 cm. This collection may further communicate to midline laparotomy incision, which contains air loculations. 2. Small volume perihepatic and perisplenic ascites, similar to prior examination, however now with clear, diffuse peritoneal thickening and hyperenhancement. Additional fluid collections in the central small bowel mesentery and low pelvis. Findings are consistent with peritonitis, and the presence or absence of infection within these fluid collections is not established by CT. 3. Moderate bilateral pleural effusions and associated atelectasis or consolidation, similar to prior examination. 4. Severe anasarca. 5. Coronary artery disease. Aortic Atherosclerosis (ICD10-I70.0). Electronically Signed   By: Delanna Ahmadi M.D.   On: 06/19/2022 11:55   US Abdomen Limited RUQ (LIVER/GB)  Result Date: 06/12/2022 CLINICAL DATA:  Hyperbilirubinemia EXAM: ULTRASOUND ABDOMEN LIMITED RIGHT UPPER QUADRANT COMPARISON:  CT abdomen and pelvis 06/11/2022 FINDINGS: Gallbladder: Sludge within gallbladder. No definite shadowing calculi. Mild gallbladder wall  thickening. No sonographic Murphy sign. Common bile duct: Diameter: 3 mm, normal Liver: Normal echogenicity without mass or nodularity. No intrahepatic biliary dilatation. Portal vein is patent on color Doppler imaging with normal direction of blood flow towards the liver. Other: Perihepatic ascites identified, containing septations/loculations. IMPRESSION: Sludge within gallbladder with mild gallbladder wall thickening, a nonspecific finding in the setting of ascites. No biliary dilatation or focal hepatic abnormality. Electronically Signed   By: Lavonia Dana M.D.   On: 06/12/2022 14:40   [place] EKG SITE RITE  Result Date: 06/11/2022 If Site Rite image not attached, placement could not be confirmed due to current cardiac rhythm.  CT ABDOMEN PELVIS WO CONTRAST  Result Date: 06/11/2022 CLINICAL DATA:  EXPLORATORY LAPAROTOMY, omental patchImpression: Worsening leukocytosis. EXAM: CT ABDOMEN AND PELVIS WITHOUT CONTRAST TECHNIQUE: Multidetector CT imaging of the abdomen and pelvis was performed following the standard protocol without IV contrast. RADIATION DOSE REDUCTION: This exam was performed according to the departmental dose-optimization program which includes automated exposure control, adjustment of the mA and/or kV according to patient size and/or use of iterative reconstruction technique.  COMPARISON:  06/03/2022. FINDINGS: Lower chest: Moderate pleural effusions. Dependent lower lobe and right middle lobe opacity, likely atelectasis. Additional ground-glass opacities are noted, most evident in the right middle lobe, suspicious for infection. Effusions are increased on the left and new on the right since the prior CT. Lung base opacities are mostly new. Hepatobiliary: No focal liver abnormality is seen. No gallstones, gallbladder wall thickening, or biliary dilatation. Pancreas: Unremarkable. No pancreatic ductal dilatation or surrounding inflammatory changes. Spleen: Normal in size without focal abnormality.  Adrenals/Urinary Tract: No adrenal masses. No renal masses or hydronephrosis. Bladder is unremarkable. Stomach/Bowel: Stomach is mostly decompressed. There is an apparent defect along the anterior aspect of the mid to distal stomach through which contrast and air has collected. The ill-defined collection of contrast and air lies in the mid to upper abdomen near the midline, with an adjacent surgical drain, and deep to the midline laparotomy incision. Poorly defined small bowel loops in the central abdomen. There is no colon or small bowel dilation to suggest obstruction. No colonic wall thickening or convincing inflammation. Vascular/Lymphatic: Aortic atherosclerosis. Prominent lymph nodes noted along the gastrohepatic ligament. There are no enlarged lymph nodes. Reproductive: Status post hysterectomy. No adnexal masses. Other: Small amount of ascites. Generalized increased attenuation of the mesenteric/peritoneal fat consistent with edema. Diffuse subcutaneous soft tissue edema. Musculoskeletal: No acute finding. IMPRESSION: 1. There is no defined fluid collection to indicate an abscess. 2. There is extraluminal air and contrast in the anterior central to upper abdomen, that appears to be arising from a defect along the anterior gastric wall. The extraluminal contrast and air lies adjacent to the surgical drain. 3. Small amount of ascites is similar to the previous CT. 4. Mesenteric/peritoneal and diffuse subcutaneous edema. 5. Moderate pleural effusions with associated lung base atelectasis. Additional areas of ground-glass lung opacity are suspicious for multifocal pneumonia. Electronically Signed   By: Lajean Manes M.D.   On: 06/11/2022 12:56   DG CHEST PORT 1 VIEW  Result Date: 06/09/2022 CLINICAL DATA:  Weakness, wheezing EXAM: PORTABLE CHEST 1 VIEW COMPARISON:  06/05/2022 FINDINGS: Interval endotracheal and esophagogastric extubation. Small to moderate bilateral pleural effusions, increased compared to  prior examination, with associated atelectasis or consolidation and diffuse bilateral interstitial pulmonary opacity. Cardiomegaly. IMPRESSION: 1. Small to moderate bilateral pleural effusions, increased compared to prior examination, with associated atelectasis or consolidation and diffuse bilateral interstitial pulmonary opacity. Findings are most consistent with worsened edema. 2. Interval endotracheal and esophagogastric extubation. 3. Cardiomegaly. Electronically Signed   By: Delanna Ahmadi M.D.   On: 06/09/2022 12:25   DG UGI W SMALL BOWEL  Result Date: 06/08/2022 CLINICAL DATA:  Post repair of 1.5 cm diameter perforated anterior wall gastric antral ulcer EXAM: WATER SOLUBLE UPPER GI SERIES TECHNIQUE: Single-column upper GI series was performed using water soluble contrast. Radiation Exposure Index (as provided by the fluoroscopic device): 43.1 mGy Kerma CONTRAST:  100 cc Omnipaque 300 via NG tube COMPARISON:  CT abdomen and pelvis 06/03/2022 FLUOROSCOPY: Fluoroscopy Time:  2 minutes 36 seconds Radiation Exposure Index (if provided by the fluoroscopic device): 43.1 Number of Acquired Spot Images: Multiple fluoroscopic screen captures and cine series FINDINGS: Contrast administered via NG tube opacifies the gastric lumen. Patent pylorus with passage of contrast through normal appearing duodenal C loop into proximal jejunum with incidentally noted duodenal diverticulum at second portion. Irregularity of rugal folds with thickening and distortion at anterior wall of gastric antrum at site of perforation repair. The third portion of the duodenum is  superimposed with the inferior wall of the gastric antrum, mildly limiting exam. A blush of contrast is seen inferior to the antrum, superimposed with the third portion of the duodenum throughout the study. This appears to move in concert with the duodenum with respiration. No contrast opacification of the JP drain. No gross evidence of contrast leak identified though  it is difficult to completely exclude a small leak at the site of repair. IMPRESSION: Distortion and thickening of rugal folds at site of antral gastric ulcer repair. No gross evidence of leak at the site of ulcer repair as discussed above. Electronically Signed   By: Lavonia Dana M.D.   On: 06/08/2022 15:32   ECHOCARDIOGRAM COMPLETE  Result Date: 06/07/2022    ECHOCARDIOGRAM REPORT   Patient Name:   MEGHAN WARSHAWSKY Date of Exam: 06/07/2022 Medical Rec #:  [id number]       Height:       61.0 in Accession #:    [id number]      Weight:       112.2 lb Date of Birth:  1946/08/29        BSA:          1.477 m Patient Age:    70 years        BP:           116/46 mmHg Patient Gender: F               HR:           104 bpm. Exam Location:  [facility] Procedure: 2D Echo, Cardiac Doppler and Color Doppler Indications:    Bacteremia  History:        Patient has no prior history of Echocardiogram examinations.                 Signs/Symptoms:Bacteremia; Risk Factors:Hypertension and                 Dyslipidemia. Breast CA.  Sonographer:    Wenda Low Referring Phys: [id number] [place]  1. Abnormal septal motion . Left ventricular ejection fraction, by estimation, is 55 to 60%. The left ventricle has normal function. The left ventricle has no regional wall motion abnormalities. Left ventricular diastolic parameters were normal.  2. Right ventricular systolic function is normal. The right ventricular size is normal. There is mildly elevated pulmonary artery systolic pressure.  3. Moderate pleural effusion in the left lateral region.  4. The mitral valve is abnormal. Trivial mitral valve regurgitation. No evidence of mitral stenosis.  5. Thickening and calcification noted . The tricuspid valve is abnormal.  6. Some thickening in the LVOT near intervalvular fibrosa Given this and nodular calcification of PV suggest TEE if suspicion for SBE high. The aortic valve is tricuspid. There is moderate calcification of the  aortic valve. There is moderate thickening of the aortic valve. Aortic valve regurgitation is mild. Aortic valve sclerosis/calcification is present, without any evidence of aortic stenosis.  7. Nodular calcification seen on PV. The pulmonic valve was abnormal.  8. The inferior vena cava is normal in size with greater than 50% respiratory variability, suggesting right atrial pressure of 3 mmHg. FINDINGS  Left Ventricle: Abnormal septal motion. Left ventricular ejection fraction, by estimation, is 55 to 60%. The left ventricle has normal function. The left ventricle has no regional wall motion abnormalities. The left ventricular internal cavity size was normal in size. There is no left ventricular hypertrophy. Left ventricular diastolic parameters were normal. Right  Ventricle: The right ventricular size is normal. No increase in right ventricular wall thickness. Right ventricular systolic function is normal. There is mildly elevated pulmonary artery systolic pressure. The tricuspid regurgitant velocity is 3.21  m/s, and with an assumed right atrial pressure of 3 mmHg, the estimated right ventricular systolic pressure is 97.0 mmHg. Left Atrium: Left atrial size was normal in size. Right Atrium: Right atrial size was normal in size. Pericardium: There is no evidence of pericardial effusion. Mitral Valve: The mitral valve is abnormal. There is mild thickening of the mitral valve leaflet(s). There is mild calcification of the mitral valve leaflet(s). Mild mitral annular calcification. Trivial mitral valve regurgitation. No evidence of mitral valve stenosis. MV peak gradient, 3.9 mmHg. The mean mitral valve gradient is 1.0 mmHg. Tricuspid Valve: Thickening and calcification noted. The tricuspid valve is abnormal. Tricuspid valve regurgitation is trivial. No evidence of tricuspid stenosis. Aortic Valve: Some thickening in the LVOT near intervalvular fibrosa Given this and nodular calcification of PV suggest TEE if suspicion  for SBE high. The aortic valve is tricuspid. There is moderate calcification of the aortic valve. There is moderate thickening of the aortic valve. Aortic valve regurgitation is mild. Aortic valve sclerosis/calcification is present, without any evidence of aortic stenosis. Aortic valve mean gradient measures 5.0 mmHg. Aortic valve peak gradient measures 10.3 mmHg. Aortic valve area, by VTI measures 2.02 cm. Pulmonic Valve: Nodular calcification seen on PV. The pulmonic valve was abnormal. Pulmonic valve regurgitation is trivial. No evidence of pulmonic stenosis. Aorta: The aortic root is normal in size and structure. Venous: The inferior vena cava is normal in size with greater than 50% respiratory variability, suggesting right atrial pressure of 3 mmHg. IAS/Shunts: No atrial level shunt detected by color flow Doppler. Additional Comments: There is a moderate pleural effusion in the left lateral region.  LEFT VENTRICLE PLAX 2D LVIDd:         4.50 cm     Diastology LVIDs:         3.00 cm     LV e' medial:    7.29 cm/s LV PW:         0.80 cm     LV E/e' medial:  11.8 LV IVS:        0.80 cm     LV e' lateral:   9.79 cm/s LVOT diam:     1.80 cm     LV E/e' lateral: 8.8 LV SV:         55 LV SV Index:   37 LVOT Area:     2.54 cm  LV Volumes (MOD) LV vol d, MOD A2C: 43.7 ml LV vol d, MOD A4C: 52.7 ml LV vol s, MOD A2C: 17.3 ml LV vol s, MOD A4C: 23.8 ml LV SV MOD A2C:     26.4 ml LV SV MOD A4C:     52.7 ml LV SV MOD BP:      28.6 ml RIGHT VENTRICLE RV Basal diam:  3.10 cm RV Mid diam:    2.90 cm RV S prime:     14.50 cm/s TAPSE (M-mode): 3.0 cm LEFT ATRIUM             Index        RIGHT ATRIUM           Index LA diam:        3.60 cm 2.44 cm/m   RA Area:     11.50 cm LA Vol (A2C):   50.9 ml  34.45 ml/m  RA Volume:   21.80 ml  14.76 ml/m LA Vol (A4C):   43.7 ml 29.58 ml/m LA Biplane Vol: 49.8 ml 33.71 ml/m  AORTIC VALVE                     PULMONIC VALVE AV Area (Vmax):    1.92 cm      PV Vmax:       0.98 m/s AV Area  (Vmean):   1.84 cm      PV Peak grad:  3.9 mmHg AV Area (VTI):     2.02 cm AV Vmax:           160.50 cm/s AV Vmean:          103.550 cm/s AV VTI:            0.274 m AV Peak Grad:      10.3 mmHg AV Mean Grad:      5.0 mmHg LVOT Vmax:         121.00 cm/s LVOT Vmean:        74.800 cm/s LVOT VTI:          0.218 m LVOT/AV VTI ratio: 0.79  AORTA Ao Root diam: 3.30 cm MITRAL VALVE               TRICUSPID VALVE MV Area (PHT): 6.02 cm    TR Peak grad:   41.2 mmHg MV Area VTI:   2.25 cm    TR Vmax:        321.00 cm/s MV Peak grad:  3.9 mmHg MV Mean grad:  1.0 mmHg    SHUNTS MV Vmax:       0.99 m/s    Systemic VTI:  0.22 m MV Vmean:      48.8 cm/s   Systemic Diam: 1.80 cm MV Decel Time: 126 msec MV E velocity: 86.00 cm/s MV A velocity: 72.70 cm/s MV E/A ratio:  1.18 Jenkins Rouge MD Electronically signed by Jenkins Rouge MD Signature Date/Time: 06/07/2022/10:28:36 AM    Final    DG CHEST PORT 1 VIEW  Result Date: 06/05/2022 CLINICAL DATA:  76 year old female with respiratory failure. Intubated. EXAM: PORTABLE CHEST 1 VIEW COMPARISON:  Portable chest 06/04/2022 and earlier. FINDINGS: Portable AP semi upright view at 0527 hours. Endotracheal tube tip in good position between the clavicles and carina. Stable left IJ central line and visible enteric tube. Increased left lung base and retrocardiac opacity now mostly obscuring the left hemidiaphragm. Slightly lower lung volumes overall. Mediastinal contours remain normal. No pneumothorax, pulmonary edema, or definite effusion. Negative right lung. Partially visible midline abdominal skin staples. Paucity of bowel gas in the upper abdomen. Stable left chest and axillary surgical clips. IMPRESSION: 1. Stable lines and tubes. 2. Increased left lower lobe collapse or consolidation since yesterday. Electronically Signed   By: Genevie Ann M.D.   On: 06/05/2022 08:26   DG Chest Port 1 View  Result Date: 06/04/2022 CLINICAL DATA:  Endotracheal tube, nasogastric tube and central line  placement. EXAM: PORTABLE CHEST 1 VIEW COMPARISON:  June 03, 2022 FINDINGS: Since the prior study there has been interval placement of an endotracheal tube. Its distal tip is seen approximately 3.8 cm from the carina. Interval nasogastric tube placement is also seen. Its distal and extends into the gastric lumen. There is a left internal jugular venous catheter with its distal tip overlying the distal superior vena cava. This is approximately 6 mm proximal to the junction of the  superior vena cava and right atrium. The heart size and mediastinal contours are within normal limits. Mild to moderate severity atelectasis and/or infiltrate is seen within the left lung base. This represents a new finding when compared to the prior study. There is no evidence of a pleural effusion or pneumothorax. Radiopaque surgical clips are seen along the left axilla. The air seen below the right hemidiaphragm on the prior study is no longer visualized. The visualized skeletal structures are unremarkable. IMPRESSION: 1. Interval endotracheal tube, nasogastric tube and left internal jugular venous catheter placement positioning, as described above. 2. Mild to moderate severity left basilar atelectasis and/or infiltrate. Electronically Signed   By: Virgina Norfolk M.D.   On: 06/04/2022 02:47   CT Abdomen Pelvis Wo Contrast  Result Date: 06/03/2022 CLINICAL DATA:  Severe weakness for 2 days with acute abdominal pain, initial encounter EXAM: CT ABDOMEN AND PELVIS WITHOUT CONTRAST TECHNIQUE: Multidetector CT imaging of the abdomen and pelvis was performed following the standard protocol without IV contrast. RADIATION DOSE REDUCTION: This exam was performed according to the departmental dose-optimization program which includes automated exposure control, adjustment of the mA and/or kV according to patient size and/or use of iterative reconstruction technique. COMPARISON:  None Available. FINDINGS: Lower chest: Basilar atelectatic changes  are noted. Hepatobiliary: Liver is within normal limits. Gallbladder demonstrates a normal appearance. Pancreas: Unremarkable. No pancreatic ductal dilatation or surrounding inflammatory changes. Spleen: Normal in size without focal abnormality. Adrenals/Urinary Tract: Adrenal glands are within normal limits. Kidneys demonstrate tiny nonobstructing stones on right in the lower pole. No ureteral obstruction is seen. The bladder is decompressed. Stomach/Bowel: Considerable fecal material is noted within the rectal vault which may represent a focal impaction. Diverticular change of the colon is noted as well as some generalized wall thickening throughout the colon. The appendix is within normal limits. Small bowel is within normal limits. Stomach demonstrates a defect in the anterior gastric wall consistent with a perforated ulcer. There is a considerable amount of air and fluid throughout the abdomen with apparent direct communication with the gastric best seen on image number 36 of series 2 and image number 78 of series 6. Vascular/Lymphatic: Aortic atherosclerosis. No enlarged abdominal or pelvic lymph nodes. Reproductive: Status post hysterectomy. No adnexal masses. Other: Free air and free fluid similar to that described above throughout the abdomen. The bowel demonstrates some reactive wall thickening related to the fluid and air. Musculoskeletal: Degenerative changes of the lumbar spine are seen. No other bony abnormality is noted. IMPRESSION: Changes consistent with a perforated gastric ulcer anteriorly with considerable spillage of fluid and air into the abdominal cavity. Some reactive wall thickening is noted within the bowel loops secondary to these changes. Critical Value/emergent results were called by telephone at the time of interpretation on 06/03/2022 at 10:43 pm to Dr. Aletta Edouard , who verbally acknowledged these results. Electronically Signed   By: Inez Catalina M.D.   On: 06/03/2022 22:45   DG  Chest Port 1 View  Result Date: 06/03/2022 CLINICAL DATA:  Weakness. EXAM: PORTABLE CHEST 1 VIEW COMPARISON:  None Available. FINDINGS: Multiple overlying radiopaque cardiac lead wires are seen. The heart size and mediastinal contours are within normal limits. Mild, diffuse, chronic appearing increased lung markings are seen. There is no evidence of an acute infiltrate, pleural effusion or pneumothorax. Radiopaque surgical clips are seen along the lateral aspect of the left chest wall. A crescentic area of air is seen just below the right hemidiaphragm. Degenerative changes seen throughout the  thoracic spine. IMPRESSION: 1. Chronic appearing increased lung markings without evidence of acute or active cardiopulmonary disease. 2. Air seen just below the right hemidiaphragm which may represent an air filled loop of colon. Further evaluation with abdomen pelvis CT is recommended, as the presence of intra-abdominal free air cannot be excluded. Electronically Signed   By: Virgina Norfolk M.D.   On: 06/03/2022 21:22     Roxan Hockey, MD How to contact the [facility] Attending or Consulting provider Grandfather or covering provider during after hours [place], for this patient?  Check the care team in [facility] and look for a) attending/consulting [facility] provider listed and b) the [facility] team listed Log into [email or web address] and use [facility] universal password to access. If you do not have the password, please contact the hospital operator. Locate the [facility] provider you are looking for under [facility] and page to a number that you can be directly reached. If you still have difficulty reaching the provider, please page the [facility] (Director on Call) for the Hospitalists listed on amion for assistance.   [facility]  If 7PM-7AM, please contact night-coverage [email or web address] 06/20/2022, 8:28 PM   LOS: 16 days

## 2022-06-20 NOTE — Progress Notes (Signed)
PT Cancellation Note  Patient Details Name: Carolyn Erickson MRN: [id number] DOB: 1946/10/25   Cancelled Treatment:    Reason Eval/Treat Not Completed: Medical issues which prohibited therapy.  Patient transferred to a higher level of care and will need new PT consult to resume therapy when patient is medically stable.  Thank you.   7:34 AM, 06/20/22 Lonell Grandchild, MPT Physical Therapist with [facility] [phone] office [phone] mobile phone

## 2022-06-20 NOTE — Progress Notes (Signed)
Pharmacy Antibiotic Note  Carolyn Erickson is a 76 y.o. female admitted on 06/03/2022 and found to have gastric perforation with peritonitis. Pharmacy has been consulted to adjust Zosyn to Unasyn.  SCr 1.13, CrCl>30 ml/min - will increase Unasyn dosing. Micafungin continuing for Candida glabrata fungemia.  Plan: Micafungin 100 mg IV every 24 hours to end after today's dose Unasyn 3000 mg IV every 12 hours. F/U duration  Height: '5\' 1"'$  (154.9 cm) Weight: 56.8 kg (125 lb 3.5 oz) IBW/kg (Calculated) : 47.8  Temp (24hrs), Avg:98.2 F (36.8 C), Min:97.4 F (36.3 C), Max:98.8 F (37.1 C)  Recent Labs  Lab 06/15/22 0606 06/16/22 0609 06/17/22 0546 06/18/22 0617 06/19/22 0606 06/19/22 1848 06/20/22 0409  WBC 25.9*   < > 21.3* 22.1* 19.0* 19.0* 18.3*  CREATININE 1.13*  --  1.23* 1.21* 1.18*  --  1.36*   < > = values in this interval not displayed.     Estimated Creatinine Clearance: 25.7 mL/min (A) (by C-G formula based on SCr of 1.36 mg/dL (H)).    No Known Allergies  Thank you for allowing pharmacy to be a part of this patient's care.  Margot Ables, PharmD Clinical Pharmacist 06/20/2022 8:16 AM

## 2022-06-20 NOTE — Progress Notes (Signed)
Hgb 9.4 on recheck, Dr. Constance Haw aware. Will continue to monitor hgb and for bloody BM, transfuse as necessary. Family updated at bedside.

## 2022-06-21 DIAGNOSIS — J9601 Acute respiratory failure with hypoxia: Secondary | ICD-10-CM | POA: Diagnosis not present

## 2022-06-21 DIAGNOSIS — D72829 Elevated white blood cell count, unspecified: Secondary | ICD-10-CM

## 2022-06-21 DIAGNOSIS — K251 Acute gastric ulcer with perforation: Secondary | ICD-10-CM | POA: Diagnosis not present

## 2022-06-21 DIAGNOSIS — A419 Sepsis, unspecified organism: Secondary | ICD-10-CM | POA: Diagnosis not present

## 2022-06-21 DIAGNOSIS — N179 Acute kidney failure, unspecified: Secondary | ICD-10-CM | POA: Diagnosis not present

## 2022-06-21 LAB — CBC
HCT: 25.1 % — ABNORMAL LOW (ref 36.0–46.0)
Hemoglobin: 8.3 g/dL — ABNORMAL LOW (ref 12.0–15.0)
MCH: 30.7 pg (ref 26.0–34.0)
MCHC: 33.1 g/dL (ref 30.0–36.0)
MCV: 93 fL (ref 80.0–100.0)
Platelets: 419 10*3/uL — ABNORMAL HIGH (ref 150–400)
RBC: 2.7 MIL/uL — ABNORMAL LOW (ref 3.87–5.11)
RDW: 16 % — ABNORMAL HIGH (ref 11.5–15.5)
WBC: 16 10*3/uL — ABNORMAL HIGH (ref 4.0–10.5)
nRBC: 0 % (ref 0.0–0.2)

## 2022-06-21 LAB — GLUCOSE, CAPILLARY
Glucose-Capillary: 132 mg/dL — ABNORMAL HIGH (ref 70–99)
Glucose-Capillary: 149 mg/dL — ABNORMAL HIGH (ref 70–99)
Glucose-Capillary: 123 mg/dL — ABNORMAL HIGH (ref 70–99)

## 2022-06-21 MED ORDER — TRAVASOL 10 % IV SOLN
INTRAVENOUS | Status: AC
  Filled 2022-06-21: qty 772.8

## 2022-06-21 MED ORDER — INSULIN ASPART 100 UNIT/ML IJ SOLN
0.0000 [IU] | Freq: Three times a day (TID) | INTRAMUSCULAR | Status: DC
  Administered 2022-06-21 – 2022-06-23 (×2): 1 [IU] via SUBCUTANEOUS
  Administered 2022-06-23: 2 [IU] via SUBCUTANEOUS
  Administered 2022-06-24: 1 [IU] via SUBCUTANEOUS
  Administered 2022-06-24: 2 [IU] via SUBCUTANEOUS

## 2022-06-21 NOTE — Progress Notes (Addendum)
PHARMACY - TOTAL PARENTERAL NUTRITION CONSULT NOTE   Indication: gastric perforation  Patient Measurements: Height: '5\' 1"'$  (154.9 cm) Weight: 54.3 kg (119 lb 11.4 oz) IBW/kg (Calculated) : 47.8 TPN AdjBW (KG): 57.7 Body mass index is 22.62 kg/m.  Assessment:  Pharmacy consulted to dose TPN in patient with gastric perforation.  Glucose / Insulin:    117-132- 4 units Electrolytes: K 4.1, mag 1.9> 2.2  Corrected calcium 11.9 Renal: Scr 1.18> 1.36 Hepatic: albumin 1.7 Tbili 4.4> 3.7> 2.7 TG 144   GI Surgeries / Procedures:   Repeat CT on  06/19/22 shows persistent Leak and Peritonitis  Exploratory laparotomy, gastrorrhaphy  Central access: PICC double lumen 7/10 TPN start date: 7/10  Nutritional Goals: Goal TPN rate is 70 mL/hr (provides 77 g of protein and 1444 kcals per day)  RD Assessment: Kcal:  1400-1600 Protein:  75-80 gr  Fluid:  >/= 1600 ml daily  Current Nutrition:  NPO  Plan:  Continue TPN at 70 mL/hr at 1800 Electrolytes in TPN: Na 53mq/L, K 40 mEq/L, Ca 0 mEq/L, Mg 5 mEq/L, and Phos 10 mmol/L. Cl:Ac 1:1 Add 14 units inuslin in TPN  standard MVI and trace elements to TPN Continue Sensitive q8h SSI and adjust as needed  Monitor TPN labs on Mon/Thurs,   SMargot Ables PharmD Clinical Pharmacist 06/21/2022 9:04 AM

## 2022-06-21 NOTE — TOC Progression Note (Signed)
Transition of Care [facility]) - Progression Note    Patient Details  Name: [name] MRN: [id number] Date of Birth: 1946-11-07  Transition of [facility]) CM/SW [place], [place] Phone Number: 06/21/2022, 11:34 AM  Clinical Narrative:    CSW updated Jaquelyn Bitter with [facility] that MD is hopeful for D/C to [facility] on Friday 7/21. Jaquelyn Bitter will start insurance auth as soon as possible for pt. TOC to follow.   Expected Discharge Plan: [place] Barriers to Discharge: Continued Medical Work up  Expected Discharge Plan and Services Expected Discharge Plan: [place] In-house Referral: Clinical Social Work   Post Acute Care Choice: [place] Living arrangements for the past 2 months: Single Family Home                                       Social Determinants of Health (SDOH) Interventions    Readmission Risk Interventions     No data to display

## 2022-06-21 NOTE — Progress Notes (Signed)
Physical Therapy Treatment/RE-ASSESSMENT Patient Details Name: Carolyn Erickson MRN: [id number] DOB: [birth year] Today's Date: 06/21/2022   History of Present Illness Carolyn Erickson is a 76 yo with a history of HTN and depression who comes in with weakness and abdominal pain. She has been having some pain generalized for months being treated for gastritis/ ulcer with PPI by PCP and referred to GI. She has not seen GI. She has a history of diarrhea and constipation. She says over the last few days she was feeling weaker and her pain because worse and more severe acutely. She denies any chest pain or SOB. She has no fever. She has been hypotensive and required fluid bolus and is on levophed at 2 in the ED.    PT Comments    Patient supine in bed upon therapist arrival with nursing present. Husband arrived during session. Patient fair at following commands and is difficult to understand her speech. Patient min to mod assist for bed mobility and transfers. Patient exhibits posterior/right lean in sitting and posterior lean in standing requiring assistance to maintain an upright posture. Patient limited to a few slow, labored, unsteady side steps with frequent minor buckling of knees and posterior lean requiring assistance to maintain an upright posture. Moderate gait deviations noted. Patient limited mostly due to fatigue. Patient on room air throughout.  Patient would continue to benefit from skilled physical therapy in current environment and next venue to continue return to prior function and increase strength, endurance, balance, coordination, and functional mobility and gait skills.    Recommendations for follow up therapy are one component of a multi-disciplinary discharge planning process, led by the attending physician.  Recommendations may be updated based on patient status, additional functional criteria and insurance authorization.  Follow Up Recommendations  Skilled nursing-short term rehab (<3  hours/day) Can patient physically be transported by private vehicle: Yes   Assistance Recommended at Discharge Intermittent Supervision/Assistance  Patient can return home with the following A lot of help with walking and/or transfers;Help with stairs or ramp for entrance;A lot of help with bathing/dressing/bathroom;Assistance with cooking/housework   Equipment Recommendations  None recommended by PT    Recommendations for Other Services       Precautions / Restrictions Precautions Precautions: Fall Restrictions Weight Bearing Restrictions: No     Mobility  Bed Mobility Overal bed mobility: Needs Assistance Bed Mobility: Rolling, Sidelying to Sit Rolling: Min assist, Mod assist Sidelying to sit: Mod assist, Min assist       General bed mobility comments: slow labored movement with poor carrryover for using bed rail during side lying to sitting    Transfers Overall transfer level: Needs assistance Equipment used: Rolling walker (2 wheels) Transfers: Sit to/from Stand, Bed to chair/wheelchair/BSC Sit to Stand: Min assist, Mod assist   Step pivot transfers: Mod assist       General transfer comment: very unsteady labored movement with posterior lean    Ambulation/Gait Ambulation/Gait assistance: Mod assist Gait Distance (Feet): 4 Feet Assistive device: Rolling walker (2 wheels) Gait Pattern/deviations: Decreased step length - right, Decreased step length - left, Decreased stride length, Wide base of support, Knees buckling Gait velocity: decreased     General Gait Details: limited to a few slow labored unsteady side steps with frequent minor buckling of knees and posterior lean, limited mostly due to fatigue; on room air   Stairs       Wheelchair Mobility    Modified Rankin (Stroke Patients Only)  Balance Overall balance assessment: Needs assistance Sitting-balance support: Feet supported, No upper extremity supported Sitting balance-Leahy  Scale: Poor Sitting balance - Comments: poor seated at EOB Postural control: Posterior lean, Right lateral lean Standing balance support: Reliant on assistive device for balance, During functional activity, Bilateral upper extremity supported Standing balance-Leahy Scale: Poor Standing balance comment: poor using RW        Cognition Arousal/Alertness: Awake/alert Behavior During Therapy: WFL for tasks assessed/performed, Anxious Overall Cognitive Status: Within Functional Limits for tasks assessed          Exercises      General Comments        Pertinent Vitals/Pain Pain Assessment Breathing: normal Negative Vocalization: none Facial Expression: smiling or inexpressive Body Language: relaxed Consolability: no need to console PAINAD Score: 0    Home Living               Prior Function            PT Goals (current goals can now be found in the care plan section) Acute Rehab PT Goals Patient Stated Goal: return home ultimately PT Goal Formulation: With patient/family Time For Goal Achievement: 06/21/22 Potential to Achieve Goals: Good Progress towards PT goals: Progressing toward goals    Frequency    Min 3X/week      PT Plan Current plan remains appropriate       AM-PAC PT "6 Clicks" Mobility   Outcome Measure  Help needed turning from your back to your side while in a flat bed without using bedrails?: A Lot Help needed moving from lying on your back to sitting on the side of a flat bed without using bedrails?: A Lot Help needed moving to and from a bed to a chair (including a wheelchair)?: A Lot Help needed standing up from a chair using your arms (e.g., wheelchair or bedside chair)?: A Lot Help needed to walk in hospital room?: A Lot Help needed climbing 3-5 steps with a railing? : Total 6 Click Score: 11    End of Session Equipment Utilized During Treatment: Gait belt Activity Tolerance: Patient tolerated treatment well;Patient limited by  fatigue Patient left: in chair;with call bell/phone within reach;with family/visitor present Nurse Communication: Mobility status PT Visit Diagnosis: Unsteadiness on feet (R26.81);Other abnormalities of gait and mobility (R26.89);Muscle weakness (generalized) (M62.81)     Time: 1610-9604 PT Time Calculation (min) (ACUTE ONLY): 23 min  Charges:  $Therapeutic Activity: 8-22 mins                     Floria Raveling. Hartnett-Rands, MS, PT Per [place] [phone]  Pamala Hurry  Hartnett-Rands 06/21/2022, 2:19 PM

## 2022-06-21 NOTE — Progress Notes (Addendum)
I was present with the medical student for this service. I personally verified the history of present illness, performed the physical exam, and made the plan for this encounter. I have verified the medical student's documentation and made modifications where appropriately. I have personally documented in my own words a brief history, physical, and plan below.     Doing fair this Am. Looks await and is watching TV. Some dark stools but no bloody stools. H&H stablizing out.  TPN until no leak JP stays in place Wound packing Unasyn until 7/25 (1 more week after repeat CT and the leak with gas in midline) Repeat CT with oral contrast at [facility] 7/31, if no leak then feed, if leak then do another 2 weeks, family knows this is a marathon LTAC Friday pending no further bleeding Transfer back to 300 PT reordered   Update Gerald Stabs and discussed with team. Curlene Labrum, MD [address] [place], [place] [address] [phone] (office)   [facility] Progress Note  17 Days Post-Op  Subjective: Patient is resting in bed and states she is "doing fine" this morning. She is not in acute distress and has no particular concerns at this time. Nursing team reports no bloody BM throughout yesterday except for some mild spotting. JP drain light yellow fluid with mild purulence.  Objective: Vital signs in last 24 hours: Temp:  [98.4 F (36.9 C)-98.6 F (37 C)] 98.6 F (37 C) (07/18 2336) Pulse Rate:  [27-107] 52 (07/19 0700) Resp:  [17-27] 20 (07/19 0700) BP: (128-167)/(51-111) 138/56 (07/19 0700) SpO2:  [89 %-97 %] 96 % (07/19 0700) Weight:  [54.3 kg] 54.3 kg (07/19 0600) Last BM Date : 06/20/22  Intake/Output from previous day: 07/18 0701 - 07/19 0700 In: 826.4 [I.V.:626.4; IV Piggyback:200] Out: 31 [Drains:30; Stool:1] Intake/Output this shift: No intake/output data recorded.  General appearance: Patient is alert, cooperative, not  in acute distress Resp: Regular work of breathing on room air, CTAB Cardio: Normal rate, regular rhythm, no mumurs Skin: warm, dry Incision/Wound: JP drainage light yellow fluid, mild purulence. Abdominal surgical wound with staples out, inferior section of wound open for saline gauze packing. Wound appears clean, old saline packed gauze with brown/dark red stains and some mucous/purulence. Some mucous/purulence observed in wound along with dried blood  Lab Results:  Recent Labs    06/20/22 0409 06/21/22 0407  WBC 18.3* 16.0*  HGB 9.2* 8.3*  HCT 28.6* 25.1*  PLT 465* 419*   BMET Recent Labs    06/19/22 0606 06/20/22 0409  NA 143 142  K 4.6 4.1  CL 111 111  CO2 25 26  GLUCOSE 130* 133*  BUN 40* 42*  CREATININE 1.18* 1.36*  CALCIUM 10.4* 9.9   PT/INR Recent Labs    06/19/22 1613  LABPROT 13.3  INR 1.0    Studies/Results: CT ABDOMEN PELVIS W CONTRAST  Result Date: 06/19/2022 CLINICAL DATA:  Peritonitis, gastric perforation, status post laparotomy and omental patch, contained leak evaluate for continued leak and abscess formation EXAM: CT ABDOMEN AND PELVIS WITH CONTRAST TECHNIQUE: Multidetector CT imaging of the abdomen and pelvis was performed using the standard protocol following bolus administration of intravenous contrast. RADIATION DOSE REDUCTION: This exam was performed according to the departmental dose-optimization program which includes automated exposure control, adjustment of the mA and/or kV according to patient size and/or use of iterative reconstruction technique. CONTRAST:  107m OMNIPAQUE IOHEXOL 300 MG/ML SOLN, additional oral enteric contrast COMPARISON:  06/11/2022 FINDINGS: Lower chest: Moderate  bilateral pleural effusions and associated atelectasis or consolidation, similar to prior examination. Cardiomegaly. Coronary artery calcifications. Hepatobiliary: No solid liver abnormality is seen. No gallstones, gallbladder wall thickening, or biliary dilatation.  Pancreas: Unremarkable. No pancreatic ductal dilatation or surrounding inflammatory changes. Spleen: Normal in size without significant abnormality. Adrenals/Urinary Tract: Adrenal glands are unremarkable. Kidneys are normal, without renal calculi, solid lesion, or hydronephrosis. Bladder is unremarkable. Stomach/Bowel: Mucosal thickening of the gastric body and antrum. Persistent air and fluid collection in the ventral abdomen overlying the gastric antrum containing enteric contrast and communicating with the ventral gastric antrum, measuring 6.1 x 2.3 cm (series 2, image 37). This collection may further communicate to midline laparotomy incision, which contains air loculations (series 2, image 37). Appendix appears normal. No evidence of bowel wall thickening, distention, or inflammatory changes. Vascular/Lymphatic: Aortic atherosclerosis. No enlarged abdominal or pelvic lymph nodes. Reproductive: Status post hysterectomy. Other: Severe anasarca. Status post midline laparotomy, as above containing air loculations within the incision (series 2, image 35). Small volume perihepatic and perisplenic ascites, similar to prior examination, however now with clear, diffuse peritoneal thickening and hyperenhancement (series 2, image 23). Fluid loculation in the central small bowel mesentery measuring 3.9 x 2.0 cm (series 2, image 56) well as in the low pelvis measuring 3.5 x 3.5 cm (series 2, image 65). Surgical drain about the ventral abdomen. Musculoskeletal: No acute or significant osseous findings. IMPRESSION: 1. Persistent air and fluid collection in the ventral abdomen overlying the gastric antrum containing enteric contrast and communicating with the ventral gastric antrum, measuring 6.1 x 2.3 cm. This collection may further communicate to midline laparotomy incision, which contains air loculations. 2. Small volume perihepatic and perisplenic ascites, similar to prior examination, however now with clear, diffuse  peritoneal thickening and hyperenhancement. Additional fluid collections in the central small bowel mesentery and low pelvis. Findings are consistent with peritonitis, and the presence or absence of infection within these fluid collections is not established by CT. 3. Moderate bilateral pleural effusions and associated atelectasis or consolidation, similar to prior examination. 4. Severe anasarca. 5. Coronary artery disease. Aortic Atherosclerosis (ICD10-I70.0). Electronically Signed   By: Delanna Ahmadi M.D.   On: 06/19/2022 11:55    Anti-infectives: Anti-infectives (From admission, onward)    Start     Dose/Rate Route Frequency Ordered Stop   06/20/22 1200  Ampicillin-Sulbactam (UNASYN) 3 g in sodium chloride 0.9 % 100 mL IVPB        3 g 200 mL/hr over 30 Minutes Intravenous Every 12 hours 06/20/22 0815     06/16/22 0800  Ampicillin-Sulbactam (UNASYN) 3 g in sodium chloride 0.9 % 100 mL IVPB  Status:  Discontinued        3 g 200 mL/hr over 30 Minutes Intravenous Every 6 hours 06/16/22 0756 06/20/22 0815   06/13/22 1400  Ampicillin-Sulbactam (UNASYN) 3 g in sodium chloride 0.9 % 100 mL IVPB  Status:  Discontinued        3 g 200 mL/hr over 30 Minutes Intravenous Every 8 hours 06/13/22 0821 06/13/22 0823   06/13/22 1000  Ampicillin-Sulbactam (UNASYN) 3 g in sodium chloride 0.9 % 100 mL IVPB  Status:  Discontinued        3 g 200 mL/hr over 30 Minutes Intravenous Every 12 hours 06/13/22 0823 06/16/22 0756   06/11/22 2200  piperacillin-tazobactam (ZOSYN) IVPB 3.375 g  Status:  Discontinued        3.375 g 12.5 mL/hr over 240 Minutes Intravenous Every 8 hours 06/11/22 1415 06/13/22 3500  06/11/22 1500  piperacillin-tazobactam (ZOSYN) IVPB 3.375 g        3.375 g 100 mL/hr over 30 Minutes Intravenous  Once 06/11/22 1410 06/12/22 0940   06/07/22 0200  fluconazole (DIFLUCAN) IVPB 200 mg  Status:  Discontinued        200 mg 100 mL/hr over 60 Minutes Intravenous Every 24 hours 06/06/22 0901 06/06/22  1031   06/06/22 1200  micafungin (MYCAMINE) 100 mg in sodium chloride 0.9 % 100 mL IVPB        100 mg 105 mL/hr over 1 Hours Intravenous Every 24 hours 06/06/22 1031 06/20/22 1528   06/05/22 1800  piperacillin-tazobactam (ZOSYN) IVPB 3.375 g        3.375 g 12.5 mL/hr over 240 Minutes Intravenous Every 12 hours 06/05/22 0825 06/09/22 2000   06/04/22 0600  piperacillin-tazobactam (ZOSYN) IVPB 3.375 g  Status:  Discontinued        3.375 g 12.5 mL/hr over 240 Minutes Intravenous Every 8 hours 06/04/22 0219 06/05/22 0825   06/04/22 0315  fluconazole (DIFLUCAN) IVPB 200 mg  Status:  Discontinued        200 mg 100 mL/hr over 60 Minutes Intravenous Every 24 hours 06/04/22 0217 06/04/22 0219   06/04/22 0315  fluconazole (DIFLUCAN) IVPB 200 mg  Status:  Discontinued        200 mg 100 mL/hr over 60 Minutes Intravenous Every 24 hours 06/04/22 0219 06/06/22 0901   06/03/22 2130  ceFEPIme (MAXIPIME) 2 g in sodium chloride 0.9 % 100 mL IVPB        2 g 200 mL/hr over 30 Minutes Intravenous  Once 06/03/22 2127 06/03/22 2209   06/03/22 2130  metroNIDAZOLE (FLAGYL) IVPB 500 mg  Status:  Discontinued        500 mg 100 mL/hr over 60 Minutes Intravenous Every 12 hours 06/03/22 2127 06/04/22 0217       Assessment/Plan: s/p Procedure(s): POD 17; EXPLORATORY LAPAROTOMY with omental patch   Patient is a 76 y.o. F who underwent an exploratory laparotomy with omental patch and presented with septic shock.   - PRN pain and antiemetics - Patient on room air, regular work of breathing - Hemodynamically stable - Patient transferred to ICU and required 300 mL blood transfusion 7/17 due to low Hgb (9.5 on 7/17). Low Hgb and bloody BM 7/17 could be form gastric ulcer that has not healed. No bloody BM throughout 7/18 except for light spotting. Hgb down to 8.3 today 7/19 from 9.2 7/18. Continue to monitor and transfuse as necessary - Repeat CT Monday 7/17 showed continued leak with some air and contrast under the  omentum tracking to midline. Plan for repeat CT in 2 weeks 07/03/22 - Surgical wound appears clean and staples out with inferior section of wound open, packed with saline gauze. Dressing changed today - previous gauze with brown/dark red stains and some mucous/purulence. Continue to monitor contained gastrocutaneous fistula - Continue TPN - NPO except for vital meds; can have sips with meds and small amount of applesauce if needed - Continue Unasyn for contained leak. Micafungin course complete yesterday 7/18 (2 week course starting from 7/4 negative blood culture). Leukocytosis trending down (16 today 7/19 from 18.3 7/18) - SCDs - SQ heparin held due to low Hgb/bloody BM on 7/17 - Delirium precautions - LTAC planning for end of week if she continues to stabilize with no more bloody BM - DNR status - Labs in AM - Appreciate hospitalist recommendations   LOS: 17 days  Pirapat (Max) [place]erk[place], Medical Student 06/21/2022

## 2022-06-21 NOTE — Progress Notes (Signed)
PROGRESS NOTE  Carolyn Erickson KXF:[id number] DOB: 1946/07/24 DOA: 06/03/2022 PCP: Dettinger, Fransisca Kaufmann, MD  Brief History:  76 year old female with history of major neurocognitive disorder, hypertension, hyperlipidemia, depression, left breast cancer presenting with generalized weakness and abdominal pain.  Apparently, the patient has been having abdominal pain since April 2023.  The patient had been started on pantoprazole at that time.  There was no improvement.  She followed up with her PCP on 06/02/2022.  Referral was given to see gastroenterology.  Abdominal pain had worsened over the past 2 days prior to admission with worsening generalized weakness.  There is no fevers, chills, chest pain, shortness breath, vomiting, diarrhea.  There is no hematochezia or melena noted.  In the emergency department, the patient was hypotensive with a blood pressure of 71/54.  She was somnolent.  CT of the abdomen and pelvis was performed and showed changes consistent with a perforated gastric ulcer.  Dr. Constance Haw was consulted, and the patient was taken to the OR for exploratory laparotomy, gastrorrhaphy, omental patch.  A left IJ central line was also placed.  TRH is consulted for medical management  In the ED, the patient had low-grade fever 99.5 F.  She was tachycardic and hypotensive with blood pressure 68/53.  WBC 4.1, hemoglobin 14.6, platelets 308,000.  BMP showed sodium 137, potassium 4.1, bicarbonate 22, serum creatinine 2.14.  Postoperatively, the patient was started on Zosyn and fluconazole.  She remained on the ventilator.  Preoperative chest x-ray showed bibasilar atelectasis.  There was free air under the diaphragm.   Assessment and Plan: * Septic shock - RESOLVED Presented with hypotension, tachycardia, and lactic acid peaked at 6.8 Pt successfully weaned off norepinephrine infusion Secondary to gastric perforation with peritonitis - Patient s/p Ex lap, gastrorrhaphy and omental patch.   06/03/22 blood cultures--C. glabrata Continue IV Unasyn per ID  discontinued fluconazole per ID,  -continue echinocandin (micafungin) -Leukocytosis persist, trending down very slowly -Urine  culture NGTD -Repeat CT on  06/19/22 shows persistent Leak and Peritonitis.   BRBPR -with acute on chronic anemia due to acute blood loss --Pt with multiple episodes of BRBPR after her CT Abd/Pelvis with oral and iv Contrast -Hgb down to 8.3 from 9.2 yesterday -continue to follow trend and transfuse as indicated. -Monitor H&H and transfuse as needed -Only 1 bright red blood per rectum; hemoglobin level has remained stable.  Gastric perforation ([place]) Suspect this may be due to chronic NSAID use -Continue PPI twice daily -06/04/2022--ex laparotomy with omental patch -Postoperative care per Dr. Constance Haw -TNA therapy on hold temporarily due to need for CVL holiday -UGI 7/6 - no leak seen.  7/9 - CT c/w leak present - PICC line placed 7/10 and TPN started 7/10 -Repeat CT on  06/19/22 shows persistent Leak and Peritonitis -Plan is to repeat another CT abdomen and pelvis with -Contrast on 07/03/2022 probably at [facility] if patient is already discharge at that time  Nutrition----n.p.o. except for medications with spoonful of applesauce -Continue IV TPN  Fungemia 06/03/22 blood cultures--Candida glabrata -d/c fluconazole per ID consult -continue echinocandin per ID  -discussed with general surgery>>removed central line 7/4 for line holiday -repeat blood culture on 06/06/22 NGTD  Acute renal failure superimposed on stage 3a chronic kidney disease (HCC) -Serum creatinine peaked at 2.28 but trending down  -Secondary to hemodynamic changes/sepsis and hypovolemia -Creatinine much improved (currently in the 1.2 range) -renally adjust medications, avoid nephrotoxic agents / dehydration  / hypotension  Leukocytosis -- WBC peaked at 31 K -Leukocytosis persist, trending down very slowly (currently in the 16 K  range) -- appreciate ID and surgery recommendations,  -continue antifungal and Unasyn  Hyperbilirubinemia -- secondary to persistent leak.   -Continuing to monitor bilirubin trend.  -Follow general surgery recommendations and care.  Volume overload -- treated with IV lasix 40 mg x 2 on 7/7 and resolved since then. -Continue to follow volume status.  Hypomagnesemia/hypokalemia -resolved with replacement -Continue to follow ultralights trend.  Acute respiratory failure with hypoxia (HCC) Currently PRVC, RR 20, Vt 380, FiO2 0.4 -7/3 personally reviewed CXR--increased interstitial marking -7/3 ABG--7.35/36/107/20 (0.4) -7/3--extubated -7/4-5--stable on 4L [place] -7/6 - down to 2L/min  -Currently oxygen weaned off completely and demonstrating good oxygen saturation on room air.  Major neurocognitive disorder/dementia/depression Patient had MoCa 17/30 on 03/14/22 -Continue donezepil, venlafaxine, bupropion   -Continue constant reorientation.  GERD (gastroesophageal reflux disease) -Continue PPI.  Essential hypertension -amlodipine 2.5 mg daily -Blood pressure stable.  Mixed hyperlipidemia -c/n atorvastatin   Social/Ethics--- plan of care discussed with patient's husband multiple times almost on a daily basis usually at bedside.... Patient's husband accurately verbalizes understanding of the plan of care -DNR/DNI -Overall prognosis is guarded.  Dispo--LTAC around 06/22/2022 if remains hemodynamically stable.  Family Communication:   Discussed with husband and multiple family members at bedside  consultants:  General surgery is primary service, hospitalist service is consulting.  Code Status: DNR  DVT Prophylaxis:  SCDs  Procedures: As Listed in Progress Note Above  Antibiotics: Fluconazole 7/2>>7/4 Micafungin 7/4>> Zosyn 7/2>>7/11 Unasyn 7/11>>  Subjective:  06/20/22 Patient's husband and patient directly updated at bedside.  All questions answered. -Patient  remains generally weak and deconditioned; no focal deficits, no fever, chest pain, nausea or vomiting.  Tolerating TPN.  Overall hemoglobin stable and just 1 episode of blood per rectum reported overnight.  Objective: Vitals:   06/21/22 1300 06/21/22 1400 06/21/22 1500 06/21/22 1639  BP: (!) 132/56 (!) 171/78 (!) 166/79 131/87  Pulse: 82 86  74  Resp: 18 (!) 28 (!) 21   Temp:    (!) 97.5 F (36.4 C)  TempSrc:    Oral  SpO2: 99% 99%  98%  Weight:      Height:        Intake/Output Summary (Last 24 hours) at 06/21/2022 1714 Last data filed at 06/21/2022 1100 Gross per 24 hour  Intake 1333.46 ml  Output 1 ml  Net 1332.46 ml   Physical Exam General exam: Alert, awake and in no acute distress.  Continues to be confused/oriented x2 intermittently.  Following simple commands.  No fever. Respiratory system: Improved air movement bilaterally; no wheezing, no using accessory muscle.  Good saturation appreciated. Cardiovascular system:RRR. No rubs or gallops; no JVD appreciated on exam. Gastrointestinal system: Abdomen is nondistended, soft and with positive bowel sounds.  JP drain with purulent drainage appreciated clean dressings in place.   Central nervous system: No focal neurologic deficits on exam. Extremities: No cyanosis or clubbing. Skin: No petechiae.  Right arm PICC line in place. Psychiatry: Judgement and insight appear normal.  Flat affect.   Data Reviewed: I have personally reviewed following labs and imaging studies  Basic Metabolic Panel: Recent Labs  Lab 06/15/22 0606 06/17/22 0546 06/18/22 0617 06/19/22 0606 06/20/22 0409  NA 142 145 144 143 142  K 4.5 4.8 4.0 4.6 4.1  CL 109 111 111 111 111  CO2 '29 29 27 25 26  '$ GLUCOSE 182* 146* 98 130* 133*  BUN 34* 40* 39* 40* 42*  CREATININE 1.13* 1.23* 1.21* 1.18* 1.36*  CALCIUM 10.2 10.7* 10.4* 10.4* 9.9  MG 1.8  --  1.9 2.2  --   PHOS 2.7  --  3.7 4.0  --    Liver Function Tests: Recent Labs  Lab 06/15/22 0606  06/17/22 0546 06/19/22 0606 06/20/22 0409  AST 35 54* 58* 41  ALT 25 41 46* 37  ALKPHOS 101 181* 203* 139*  BILITOT 3.9* 4.4* 3.7* 2.7*  PROT 5.3* 6.2* 6.3* 5.6*  ALBUMIN 1.6* 1.7* 1.7* <1.5*   Coagulation Profile: Recent Labs  Lab 06/19/22 1613  INR 1.0   CBC: Recent Labs  Lab 06/15/22 0606 06/16/22 0609 06/17/22 0546 06/18/22 0617 06/19/22 0606 06/19/22 1613 06/19/22 1848 06/19/22 2300 06/20/22 0409 06/21/22 0407  WBC 25.9* 24.4*   < > 22.1* 19.0*  --  19.0*  --  18.3* 16.0*  NEUTROABS 21.8* 20.1*  --   --   --   --   --   --  13.8*  --   HGB 11.3* 11.3*   < > 10.8* 11.4* 9.5* 10.0* 9.4* 9.2* 8.3*  HCT 34.5* 34.5*   < > 33.8* 37.3 29.1* 31.1* 28.6* 28.6* 25.1*  MCV 92.7 93.0   < > 93.6 95.6  --  94.0  --  92.6 93.0  PLT 475* 527*   < > 618* 498*  --  562*  --  465* 419*   < > = values in this interval not displayed.   CBG: Recent Labs  Lab 06/20/22 1144 06/20/22 1830 06/20/22 2336 06/21/22 0645 06/21/22 1207  GLUCAP 128* 117* 132* 132* 149*   HbA1C: No results for input(s): "HGBA1C" in the last 72 hours.  Urine analysis:    Component Value Date/Time   COLORURINE AMBER (A) 06/04/2022 0520   APPEARANCEUR CLOUDY (A) 06/04/2022 0520   APPEARANCEUR Cloudy (A) 04/13/2021 1530   LABSPEC 1.027 06/04/2022 0520   PHURINE 5.0 06/04/2022 0520   GLUCOSEU NEGATIVE 06/04/2022 0520   HGBUR NEGATIVE 06/04/2022 0520   BILIRUBINUR NEGATIVE 06/04/2022 0520   BILIRUBINUR Negative 04/13/2021 1530   KETONESUR NEGATIVE 06/04/2022 0520   PROTEINUR 100 (A) 06/04/2022 0520   NITRITE NEGATIVE 06/04/2022 0520   LEUKOCYTESUR SMALL (A) 06/04/2022 0520    No results found for this or any previous visit (from the past 240 hour(s)).   Scheduled Meds:  sodium chloride   Intravenous Once   amLODipine  2.5 mg Oral Daily   atorvastatin  20 mg Oral QPM   buPROPion  150 mg Oral BID   Chlorhexidine Gluconate Cloth  6 each Topical Daily   donepezil  10 mg Oral QHS   insulin  aspart  0-9 Units Subcutaneous Q8H   mouth rinse  15 mL Mouth Rinse Q4H   pantoprazole (PROTONIX) IV  40 mg Intravenous Q12H   sodium chloride flush  10-40 mL Intracatheter Q12H   venlafaxine  37.5 mg Oral BID   Continuous Infusions:  ampicillin-sulbactam (UNASYN) IV 3 g (06/21/22 1147)   TPN ADULT (ION) 70 mL/hr at 06/21/22 1100   TPN ADULT (ION)      Procedures/Studies: CT ABDOMEN PELVIS W CONTRAST  Result Date: 06/19/2022 CLINICAL DATA:  Peritonitis, gastric perforation, status post laparotomy and omental patch, contained leak evaluate for continued leak and abscess formation EXAM: CT ABDOMEN AND PELVIS WITH CONTRAST TECHNIQUE: Multidetector CT imaging of the abdomen and pelvis was performed using the standard protocol following bolus administration of intravenous contrast. RADIATION DOSE  REDUCTION: This exam was performed according to the departmental dose-optimization program which includes automated exposure control, adjustment of the mA and/or kV according to patient size and/or use of iterative reconstruction technique. CONTRAST:  47m OMNIPAQUE IOHEXOL 300 MG/ML SOLN, additional oral enteric contrast COMPARISON:  06/11/2022 FINDINGS: Lower chest: Moderate bilateral pleural effusions and associated atelectasis or consolidation, similar to prior examination. Cardiomegaly. Coronary artery calcifications. Hepatobiliary: No solid liver abnormality is seen. No gallstones, gallbladder wall thickening, or biliary dilatation. Pancreas: Unremarkable. No pancreatic ductal dilatation or surrounding inflammatory changes. Spleen: Normal in size without significant abnormality. Adrenals/Urinary Tract: Adrenal glands are unremarkable. Kidneys are normal, without renal calculi, solid lesion, or hydronephrosis. Bladder is unremarkable. Stomach/Bowel: Mucosal thickening of the gastric body and antrum. Persistent air and fluid collection in the ventral abdomen overlying the gastric antrum containing enteric  contrast and communicating with the ventral gastric antrum, measuring 6.1 x 2.3 cm (series 2, image 37). This collection may further communicate to midline laparotomy incision, which contains air loculations (series 2, image 37). Appendix appears normal. No evidence of bowel wall thickening, distention, or inflammatory changes. Vascular/Lymphatic: Aortic atherosclerosis. No enlarged abdominal or pelvic lymph nodes. Reproductive: Status post hysterectomy. Other: Severe anasarca. Status post midline laparotomy, as above containing air loculations within the incision (series 2, image 35). Small volume perihepatic and perisplenic ascites, similar to prior examination, however now with clear, diffuse peritoneal thickening and hyperenhancement (series 2, image 23). Fluid loculation in the central small bowel mesentery measuring 3.9 x 2.0 cm (series 2, image 56) well as in the low pelvis measuring 3.5 x 3.5 cm (series 2, image 65). Surgical drain about the ventral abdomen. Musculoskeletal: No acute or significant osseous findings. IMPRESSION: 1. Persistent air and fluid collection in the ventral abdomen overlying the gastric antrum containing enteric contrast and communicating with the ventral gastric antrum, measuring 6.1 x 2.3 cm. This collection may further communicate to midline laparotomy incision, which contains air loculations. 2. Small volume perihepatic and perisplenic ascites, similar to prior examination, however now with clear, diffuse peritoneal thickening and hyperenhancement. Additional fluid collections in the central small bowel mesentery and low pelvis. Findings are consistent with peritonitis, and the presence or absence of infection within these fluid collections is not established by CT. 3. Moderate bilateral pleural effusions and associated atelectasis or consolidation, similar to prior examination. 4. Severe anasarca. 5. Coronary artery disease. Aortic Atherosclerosis (ICD10-I70.0). Electronically  Signed   By: ADelanna AhmadiM.D.   On: 06/19/2022 11:55   UKoreaAbdomen Limited RUQ (LIVER/GB)  Result Date: 06/12/2022 CLINICAL DATA:  Hyperbilirubinemia EXAM: ULTRASOUND ABDOMEN LIMITED RIGHT UPPER QUADRANT COMPARISON:  CT abdomen and pelvis 06/11/2022 FINDINGS: Gallbladder: Sludge within gallbladder. No definite shadowing calculi. Mild gallbladder wall thickening. No sonographic Murphy sign. Common bile duct: Diameter: 3 mm, normal Liver: Normal echogenicity without mass or nodularity. No intrahepatic biliary dilatation. Portal vein is patent on color Doppler imaging with normal direction of blood flow towards the liver. Other: Perihepatic ascites identified, containing septations/loculations. IMPRESSION: Sludge within gallbladder with mild gallbladder wall thickening, a nonspecific finding in the setting of ascites. No biliary dilatation or focal hepatic abnormality. Electronically Signed   By: MLavonia DanaM.D.   On: 06/12/2022 14:40   UKoreaEKG SITE RITE  Result Date: 06/11/2022 If Site Rite image not attached, placement could not be confirmed due to current cardiac rhythm.  CT ABDOMEN PELVIS WO CONTRAST  Result Date: 06/11/2022 CLINICAL DATA:  EXPLORATORY LAPAROTOMY, omental patchImpression: Worsening leukocytosis. EXAM: CT ABDOMEN AND  PELVIS WITHOUT CONTRAST TECHNIQUE: Multidetector CT imaging of the abdomen and pelvis was performed following the standard protocol without IV contrast. RADIATION DOSE REDUCTION: This exam was performed according to the departmental dose-optimization program which includes automated exposure control, adjustment of the mA and/or kV according to patient size and/or use of iterative reconstruction technique. COMPARISON:  06/03/2022. FINDINGS: Lower chest: Moderate pleural effusions. Dependent lower lobe and right middle lobe opacity, likely atelectasis. Additional ground-glass opacities are noted, most evident in the right middle lobe, suspicious for infection. Effusions are  increased on the left and new on the right since the prior CT. Lung base opacities are mostly new. Hepatobiliary: No focal liver abnormality is seen. No gallstones, gallbladder wall thickening, or biliary dilatation. Pancreas: Unremarkable. No pancreatic ductal dilatation or surrounding inflammatory changes. Spleen: Normal in size without focal abnormality. Adrenals/Urinary Tract: No adrenal masses. No renal masses or hydronephrosis. Bladder is unremarkable. Stomach/Bowel: Stomach is mostly decompressed. There is an apparent defect along the anterior aspect of the mid to distal stomach through which contrast and air has collected. The ill-defined collection of contrast and air lies in the mid to upper abdomen near the midline, with an adjacent surgical drain, and deep to the midline laparotomy incision. Poorly defined small bowel loops in the central abdomen. There is no colon or small bowel dilation to suggest obstruction. No colonic wall thickening or convincing inflammation. Vascular/Lymphatic: Aortic atherosclerosis. Prominent lymph nodes noted along the gastrohepatic ligament. There are no enlarged lymph nodes. Reproductive: Status post hysterectomy. No adnexal masses. Other: Small amount of ascites. Generalized increased attenuation of the mesenteric/peritoneal fat consistent with edema. Diffuse subcutaneous soft tissue edema. Musculoskeletal: No acute finding. IMPRESSION: 1. There is no defined fluid collection to indicate an abscess. 2. There is extraluminal air and contrast in the anterior central to upper abdomen, that appears to be arising from a defect along the anterior gastric wall. The extraluminal contrast and air lies adjacent to the surgical drain. 3. Small amount of ascites is similar to the previous CT. 4. Mesenteric/peritoneal and diffuse subcutaneous edema. 5. Moderate pleural effusions with associated lung base atelectasis. Additional areas of ground-glass lung opacity are suspicious for  multifocal pneumonia. Electronically Signed   By: Lajean Manes M.D.   On: 06/11/2022 12:56   DG CHEST PORT 1 VIEW  Result Date: 06/09/2022 CLINICAL DATA:  Weakness, wheezing EXAM: PORTABLE CHEST 1 VIEW COMPARISON:  06/05/2022 FINDINGS: Interval endotracheal and esophagogastric extubation. Small to moderate bilateral pleural effusions, increased compared to prior examination, with associated atelectasis or consolidation and diffuse bilateral interstitial pulmonary opacity. Cardiomegaly. IMPRESSION: 1. Small to moderate bilateral pleural effusions, increased compared to prior examination, with associated atelectasis or consolidation and diffuse bilateral interstitial pulmonary opacity. Findings are most consistent with worsened edema. 2. Interval endotracheal and esophagogastric extubation. 3. Cardiomegaly. Electronically Signed   By: Delanna Ahmadi M.D.   On: 06/09/2022 12:25   DG UGI W SMALL BOWEL  Result Date: 06/08/2022 CLINICAL DATA:  Post repair of 1.5 cm diameter perforated anterior wall gastric antral ulcer EXAM: WATER SOLUBLE UPPER GI SERIES TECHNIQUE: Single-column upper GI series was performed using water soluble contrast. Radiation Exposure Index (as provided by the fluoroscopic device): 43.1 mGy Kerma CONTRAST:  100 cc Omnipaque 300 via NG tube COMPARISON:  CT abdomen and pelvis 06/03/2022 FLUOROSCOPY: Fluoroscopy Time:  2 minutes 36 seconds Radiation Exposure Index (if provided by the fluoroscopic device): 43.1 Number of Acquired Spot Images: Multiple fluoroscopic screen captures and cine series FINDINGS: Contrast administered via  NG tube opacifies the gastric lumen. Patent pylorus with passage of contrast through normal appearing duodenal C loop into proximal jejunum with incidentally noted duodenal diverticulum at second portion. Irregularity of rugal folds with thickening and distortion at anterior wall of gastric antrum at site of perforation repair. The third portion of the duodenum is  superimposed with the inferior wall of the gastric antrum, mildly limiting exam. A blush of contrast is seen inferior to the antrum, superimposed with the third portion of the duodenum throughout the study. This appears to move in concert with the duodenum with respiration. No contrast opacification of the JP drain. No gross evidence of contrast leak identified though it is difficult to completely exclude a small leak at the site of repair. IMPRESSION: Distortion and thickening of rugal folds at site of antral gastric ulcer repair. No gross evidence of leak at the site of ulcer repair as discussed above. Electronically Signed   By: Lavonia Dana M.D.   On: 06/08/2022 15:32   ECHOCARDIOGRAM COMPLETE  Result Date: 06/07/2022    ECHOCARDIOGRAM REPORT   Patient Name:   ALLAYAH RAINERI Date of Exam: 06/07/2022 Medical Rec #:  [id number]       Height:       61.0 in Accession #:    [id number]      Weight:       112.2 lb Date of Birth:  1946-07-13        BSA:          1.477 m Patient Age:    82 years        BP:           116/46 mmHg Patient Gender: F               HR:           104 bpm. Exam Location:  [facility] Procedure: 2D Echo, Cardiac Doppler and Color Doppler Indications:    Bacteremia  History:        Patient has no prior history of Echocardiogram examinations.                 Signs/Symptoms:Bacteremia; Risk Factors:Hypertension and                 Dyslipidemia. Breast CA.  Sonographer:    Wenda Low Referring Phys: [id number] Elliott  1. Abnormal septal motion . Left ventricular ejection fraction, by estimation, is 55 to 60%. The left ventricle has normal function. The left ventricle has no regional wall motion abnormalities. Left ventricular diastolic parameters were normal.  2. Right ventricular systolic function is normal. The right ventricular size is normal. There is mildly elevated pulmonary artery systolic pressure.  3. Moderate pleural effusion in the left lateral region.  4. The mitral valve  is abnormal. Trivial mitral valve regurgitation. No evidence of mitral stenosis.  5. Thickening and calcification noted . The tricuspid valve is abnormal.  6. Some thickening in the LVOT near intervalvular fibrosa Given this and nodular calcification of PV suggest TEE if suspicion for SBE high. The aortic valve is tricuspid. There is moderate calcification of the aortic valve. There is moderate thickening of the aortic valve. Aortic valve regurgitation is mild. Aortic valve sclerosis/calcification is present, without any evidence of aortic stenosis.  7. Nodular calcification seen on PV. The pulmonic valve was abnormal.  8. The inferior vena cava is normal in size with greater than 50% respiratory variability, suggesting right atrial pressure of 3 mmHg.  FINDINGS  Left Ventricle: Abnormal septal motion. Left ventricular ejection fraction, by estimation, is 55 to 60%. The left ventricle has normal function. The left ventricle has no regional wall motion abnormalities. The left ventricular internal cavity size was normal in size. There is no left ventricular hypertrophy. Left ventricular diastolic parameters were normal. Right Ventricle: The right ventricular size is normal. No increase in right ventricular wall thickness. Right ventricular systolic function is normal. There is mildly elevated pulmonary artery systolic pressure. The tricuspid regurgitant velocity is 3.21  m/s, and with an assumed right atrial pressure of 3 mmHg, the estimated right ventricular systolic pressure is 16.9 mmHg. Left Atrium: Left atrial size was normal in size. Right Atrium: Right atrial size was normal in size. Pericardium: There is no evidence of pericardial effusion. Mitral Valve: The mitral valve is abnormal. There is mild thickening of the mitral valve leaflet(s). There is mild calcification of the mitral valve leaflet(s). Mild mitral annular calcification. Trivial mitral valve regurgitation. No evidence of mitral valve stenosis. MV  peak gradient, 3.9 mmHg. The mean mitral valve gradient is 1.0 mmHg. Tricuspid Valve: Thickening and calcification noted. The tricuspid valve is abnormal. Tricuspid valve regurgitation is trivial. No evidence of tricuspid stenosis. Aortic Valve: Some thickening in the LVOT near intervalvular fibrosa Given this and nodular calcification of PV suggest TEE if suspicion for SBE high. The aortic valve is tricuspid. There is moderate calcification of the aortic valve. There is moderate thickening of the aortic valve. Aortic valve regurgitation is mild. Aortic valve sclerosis/calcification is present, without any evidence of aortic stenosis. Aortic valve mean gradient measures 5.0 mmHg. Aortic valve peak gradient measures 10.3 mmHg. Aortic valve area, by VTI measures 2.02 cm. Pulmonic Valve: Nodular calcification seen on PV. The pulmonic valve was abnormal. Pulmonic valve regurgitation is trivial. No evidence of pulmonic stenosis. Aorta: The aortic root is normal in size and structure. Venous: The inferior vena cava is normal in size with greater than 50% respiratory variability, suggesting right atrial pressure of 3 mmHg. IAS/Shunts: No atrial level shunt detected by color flow Doppler. Additional Comments: There is a moderate pleural effusion in the left lateral region.  LEFT VENTRICLE PLAX 2D LVIDd:         4.50 cm     Diastology LVIDs:         3.00 cm     LV e' medial:    7.29 cm/s LV PW:         0.80 cm     LV E/e' medial:  11.8 LV IVS:        0.80 cm     LV e' lateral:   9.79 cm/s LVOT diam:     1.80 cm     LV E/e' lateral: 8.8 LV SV:         55 LV SV Index:   37 LVOT Area:     2.54 cm  LV Volumes (MOD) LV vol d, MOD A2C: 43.7 ml LV vol d, MOD A4C: 52.7 ml LV vol s, MOD A2C: 17.3 ml LV vol s, MOD A4C: 23.8 ml LV SV MOD A2C:     26.4 ml LV SV MOD A4C:     52.7 ml LV SV MOD BP:      28.6 ml RIGHT VENTRICLE RV Basal diam:  3.10 cm RV Mid diam:    2.90 cm RV S prime:     14.50 cm/s TAPSE (M-mode): 3.0 cm LEFT ATRIUM  Index        RIGHT ATRIUM           Index LA diam:        3.60 cm 2.44 cm/m   RA Area:     11.50 cm LA Vol (A2C):   50.9 ml 34.45 ml/m  RA Volume:   21.80 ml  14.76 ml/m LA Vol (A4C):   43.7 ml 29.58 ml/m LA Biplane Vol: 49.8 ml 33.71 ml/m  AORTIC VALVE                     PULMONIC VALVE AV Area (Vmax):    1.92 cm      PV Vmax:       0.98 m/s AV Area (Vmean):   1.84 cm      PV Peak grad:  3.9 mmHg AV Area (VTI):     2.02 cm AV Vmax:           160.50 cm/s AV Vmean:          103.550 cm/s AV VTI:            0.274 m AV Peak Grad:      10.3 mmHg AV Mean Grad:      5.0 mmHg LVOT Vmax:         121.00 cm/s LVOT Vmean:        74.800 cm/s LVOT VTI:          0.218 m LVOT/AV VTI ratio: 0.79  AORTA Ao Root diam: 3.30 cm MITRAL VALVE               TRICUSPID VALVE MV Area (PHT): 6.02 cm    TR Peak grad:   41.2 mmHg MV Area VTI:   2.25 cm    TR Vmax:        321.00 cm/s MV Peak grad:  3.9 mmHg MV Mean grad:  1.0 mmHg    SHUNTS MV Vmax:       0.99 m/s    Systemic VTI:  0.22 m MV Vmean:      48.8 cm/s   Systemic Diam: 1.80 cm MV Decel Time: 126 msec MV E velocity: 86.00 cm/s MV A velocity: 72.70 cm/s MV E/A ratio:  1.18 Jenkins Rouge MD Electronically signed by Jenkins Rouge MD Signature Date/Time: 06/07/2022/10:28:36 AM    Final    DG CHEST PORT 1 VIEW  Result Date: 06/05/2022 CLINICAL DATA:  76 year old female with respiratory failure. Intubated. EXAM: PORTABLE CHEST 1 VIEW COMPARISON:  Portable chest 06/04/2022 and earlier. FINDINGS: Portable AP semi upright view at 0527 hours. Endotracheal tube tip in good position between the clavicles and carina. Stable left IJ central line and visible enteric tube. Increased left lung base and retrocardiac opacity now mostly obscuring the left hemidiaphragm. Slightly lower lung volumes overall. Mediastinal contours remain normal. No pneumothorax, pulmonary edema, or definite effusion. Negative right lung. Partially visible midline abdominal skin staples. Paucity of bowel gas  in the upper abdomen. Stable left chest and axillary surgical clips. IMPRESSION: 1. Stable lines and tubes. 2. Increased left lower lobe collapse or consolidation since yesterday. Electronically Signed   By: Genevie Ann M.D.   On: 06/05/2022 08:26   DG Chest Port 1 View  Result Date: 06/04/2022 CLINICAL DATA:  Endotracheal tube, nasogastric tube and central line placement. EXAM: PORTABLE CHEST 1 VIEW COMPARISON:  June 03, 2022 FINDINGS: Since the prior study there has been interval placement of an endotracheal tube. Its distal tip is seen  approximately 3.8 cm from the carina. Interval nasogastric tube placement is also seen. Its distal and extends into the gastric lumen. There is a left internal jugular venous catheter with its distal tip overlying the distal superior vena cava. This is approximately 6 mm proximal to the junction of the superior vena cava and right atrium. The heart size and mediastinal contours are within normal limits. Mild to moderate severity atelectasis and/or infiltrate is seen within the left lung base. This represents a new finding when compared to the prior study. There is no evidence of a pleural effusion or pneumothorax. Radiopaque surgical clips are seen along the left axilla. The air seen below the right hemidiaphragm on the prior study is no longer visualized. The visualized skeletal structures are unremarkable. IMPRESSION: 1. Interval endotracheal tube, nasogastric tube and left internal jugular venous catheter placement positioning, as described above. 2. Mild to moderate severity left basilar atelectasis and/or infiltrate. Electronically Signed   By: Virgina Norfolk M.D.   On: 06/04/2022 02:47   CT Abdomen Pelvis Wo Contrast  Result Date: 06/03/2022 CLINICAL DATA:  Severe weakness for 2 days with acute abdominal pain, initial encounter EXAM: CT ABDOMEN AND PELVIS WITHOUT CONTRAST TECHNIQUE: Multidetector CT imaging of the abdomen and pelvis was performed following the standard  protocol without IV contrast. RADIATION DOSE REDUCTION: This exam was performed according to the departmental dose-optimization program which includes automated exposure control, adjustment of the mA and/or kV according to patient size and/or use of iterative reconstruction technique. COMPARISON:  None Available. FINDINGS: Lower chest: Basilar atelectatic changes are noted. Hepatobiliary: Liver is within normal limits. Gallbladder demonstrates a normal appearance. Pancreas: Unremarkable. No pancreatic ductal dilatation or surrounding inflammatory changes. Spleen: Normal in size without focal abnormality. Adrenals/Urinary Tract: Adrenal glands are within normal limits. Kidneys demonstrate tiny nonobstructing stones on right in the lower pole. No ureteral obstruction is seen. The bladder is decompressed. Stomach/Bowel: Considerable fecal material is noted within the rectal vault which may represent a focal impaction. Diverticular change of the colon is noted as well as some generalized wall thickening throughout the colon. The appendix is within normal limits. Small bowel is within normal limits. Stomach demonstrates a defect in the anterior gastric wall consistent with a perforated ulcer. There is a considerable amount of air and fluid throughout the abdomen with apparent direct communication with the gastric best seen on image number 36 of series 2 and image number 78 of series 6. Vascular/Lymphatic: Aortic atherosclerosis. No enlarged abdominal or pelvic lymph nodes. Reproductive: Status post hysterectomy. No adnexal masses. Other: Free air and free fluid similar to that described above throughout the abdomen. The bowel demonstrates some reactive wall thickening related to the fluid and air. Musculoskeletal: Degenerative changes of the lumbar spine are seen. No other bony abnormality is noted. IMPRESSION: Changes consistent with a perforated gastric ulcer anteriorly with considerable spillage of fluid and air into  the abdominal cavity. Some reactive wall thickening is noted within the bowel loops secondary to these changes. Critical Value/emergent results were called by telephone at the time of interpretation on 06/03/2022 at 10:43 pm to Dr. Aletta Edouard , who verbally acknowledged these results. Electronically Signed   By: Inez Catalina M.D.   On: 06/03/2022 22:45   DG Chest Port 1 View  Result Date: 06/03/2022 CLINICAL DATA:  Weakness. EXAM: PORTABLE CHEST 1 VIEW COMPARISON:  None Available. FINDINGS: Multiple overlying radiopaque cardiac lead wires are seen. The heart size and mediastinal contours are within normal limits. Mild, diffuse,  chronic appearing increased lung markings are seen. There is no evidence of an acute infiltrate, pleural effusion or pneumothorax. Radiopaque surgical clips are seen along the lateral aspect of the left chest wall. A crescentic area of air is seen just below the right hemidiaphragm. Degenerative changes seen throughout the thoracic spine. IMPRESSION: 1. Chronic appearing increased lung markings without evidence of acute or active cardiopulmonary disease. 2. Air seen just below the right hemidiaphragm which may represent an air filled loop of colon. Further evaluation with abdomen pelvis CT is recommended, as the presence of intra-abdominal free air cannot be excluded. Electronically Signed   By: Virgina Norfolk M.D.   On: 06/03/2022 21:22     Barton Dubois, MD How to contact the [facility] Attending or Consulting provider [place] or covering provider during after hours [place], for this patient?  Check the care team in [facility] and look for a) attending/consulting [facility] provider listed and b) the [facility] team listed Log into [email or web address] and use [place] universal password to access. If you do not have the password, please contact the hospital operator. Locate the [facility] provider you are looking for under [facility] and page to a number that you can be directly reached. If you still  have difficulty reaching the provider, please page the [facility] (Director on Call) for the Hospitalists listed on amion for assistance.   [facility]  If 7PM-7AM, please contact night-coverage [email or web address] 06/21/2022, 5:14 PM   LOS: 17 days

## 2022-06-22 DIAGNOSIS — I10 Essential (primary) hypertension: Secondary | ICD-10-CM | POA: Diagnosis not present

## 2022-06-22 DIAGNOSIS — A419 Sepsis, unspecified organism: Secondary | ICD-10-CM | POA: Diagnosis not present

## 2022-06-22 DIAGNOSIS — K251 Acute gastric ulcer with perforation: Secondary | ICD-10-CM | POA: Diagnosis not present

## 2022-06-22 DIAGNOSIS — N179 Acute kidney failure, unspecified: Secondary | ICD-10-CM | POA: Diagnosis not present

## 2022-06-22 LAB — CBC WITH DIFFERENTIAL/PLATELET
Abs Immature Granulocytes: 0.11 10*3/uL — ABNORMAL HIGH (ref 0.00–0.07)
Basophils Absolute: 0.1 10*3/uL (ref 0.0–0.1)
Basophils Relative: 1 %
Eosinophils Absolute: 0.5 10*3/uL (ref 0.0–0.5)
Eosinophils Relative: 3 %
HCT: 28.3 % — ABNORMAL LOW (ref 36.0–46.0)
Hemoglobin: 9 g/dL — ABNORMAL LOW (ref 12.0–15.0)
Immature Granulocytes: 1 %
Lymphocytes Relative: 12 %
Lymphs Abs: 2 10*3/uL (ref 0.7–4.0)
MCH: 30.2 pg (ref 26.0–34.0)
MCHC: 31.8 g/dL (ref 30.0–36.0)
MCV: 95 fL (ref 80.0–100.0)
Monocytes Absolute: 2.1 10*3/uL — ABNORMAL HIGH (ref 0.1–1.0)
Monocytes Relative: 12 %
Neutro Abs: 13 10*3/uL — ABNORMAL HIGH (ref 1.7–7.7)
Neutrophils Relative %: 71 %
Platelets: 427 10*3/uL — ABNORMAL HIGH (ref 150–400)
RBC: 2.98 MIL/uL — ABNORMAL LOW (ref 3.87–5.11)
RDW: 15.9 % — ABNORMAL HIGH (ref 11.5–15.5)
WBC: 17.8 10*3/uL — ABNORMAL HIGH (ref 4.0–10.5)
nRBC: 0 % (ref 0.0–0.2)

## 2022-06-22 LAB — GLUCOSE, CAPILLARY
Glucose-Capillary: 111 mg/dL — ABNORMAL HIGH (ref 70–99)
Glucose-Capillary: 113 mg/dL — ABNORMAL HIGH (ref 70–99)
Glucose-Capillary: 116 mg/dL — ABNORMAL HIGH (ref 70–99)

## 2022-06-22 LAB — COMPREHENSIVE METABOLIC PANEL
ALT: 46 U/L — ABNORMAL HIGH (ref 0–44)
AST: 53 U/L — ABNORMAL HIGH (ref 15–41)
Albumin: 1.6 g/dL — ABNORMAL LOW (ref 3.5–5.0)
Alkaline Phosphatase: 190 U/L — ABNORMAL HIGH (ref 38–126)
Anion gap: 5 (ref 5–15)
BUN: 41 mg/dL — ABNORMAL HIGH (ref 8–23)
CO2: 27 mmol/L (ref 22–32)
Calcium: 10.5 mg/dL — ABNORMAL HIGH (ref 8.9–10.3)
Chloride: 110 mmol/L (ref 98–111)
Creatinine, Ser: 1.24 mg/dL — ABNORMAL HIGH (ref 0.44–1.00)
GFR, Estimated: 45 mL/min — ABNORMAL LOW (ref >60)
Glucose, Bld: 112 mg/dL — ABNORMAL HIGH (ref 70–99)
Potassium: 3.6 mmol/L (ref 3.5–5.1)
Sodium: 142 mmol/L (ref 135–145)
Total Bilirubin: 2.9 mg/dL — ABNORMAL HIGH (ref 0.3–1.2)
Total Protein: 6.2 g/dL — ABNORMAL LOW (ref 6.5–8.1)

## 2022-06-22 LAB — MAGNESIUM: Magnesium: 2 mg/dL (ref 1.7–2.4)

## 2022-06-22 LAB — PHOSPHORUS: Phosphorus: 3.9 mg/dL (ref 2.5–4.6)

## 2022-06-22 LAB — TRIGLYCERIDES: Triglycerides: 114 mg/dL (ref <150)

## 2022-06-22 MED ORDER — TRAVASOL 10 % IV SOLN
INTRAVENOUS | Status: AC
  Filled 2022-06-22: qty 772.8

## 2022-06-22 NOTE — Progress Notes (Addendum)
I was present with the medical student for this service. I personally verified the history of present illness, performed the physical exam, and made the plan for this encounter. I have verified the medical student's documentation and made modifications where appropriately. I have personally documented in my own words a brief history, physical, and plan below.     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  Discussed with husband that this could take months to heal and that if this gastrocutaneous fistula continues to form will have decide about TPN versus feed trials for contained fistula and see if it can heal. Would do TPN and NPO for at least another month after 7/31 before doing something like that .   Carolyn Labrum, MD [facility] [place], [place] [id number] [phone] (office)   [facility] Progress Note  18 Days Post-Op  Subjective: Patient is resting in bed and is feeling okay this morning. She is not in acute distress, she says she is thirsty. Nursing team reports no acute issues overnight - they report she called out a few times throughout the night. JP drain light yellow fluid with some purulence.  Objective: Vital signs in last 24 hours: Temp:  [97.1 F (36.2 C)-98.1 F (36.7 C)] 98.1 F (36.7 C) (07/20 0441) Pulse Rate:  [71-90] 79 (07/20 0441) Resp:  [16-28] 16 (07/20 0441) BP: (131-171)/(56-87) 155/82 (07/20 0441) SpO2:  [96 %-100 %] 98 % (07/20 0441) Last BM Date : 06/21/22  Intake/Output from  previous day: 07/19 0701 - 07/20 0700 In: 1767.3 [I.V.:1567.3; IV Piggyback:200] Out: 800 [Urine:800] Intake/Output this shift: No intake/output data recorded.  General appearance: Patient is alert, interactive, not in acute distress Resp: Regular work of breathing on room air, CTAB Cardio: Normal rate, regular rhythm, no mumurs Skin: warm, dry Incision/Wound: JP drainage light yellow fluid with some purulence. Abdominal surgical wound with staples out, inferior section of wound open for saline gauze packing. Wound appears clean, old saline packed gauze with less brown/dark red stains compared to yesterday and increased mucous on gauze and in wound  Lab Results:  Recent Labs    06/20/22 0409 06/21/22 0407  WBC 18.3* 16.0*  HGB 9.2* 8.3*  HCT 28.6* 25.1*  PLT 465* 419*   BMET Recent Labs    06/20/22 0409 06/22/22 0605  NA 142 142  K 4.1 3.6  CL 111 110  CO2 26 27  GLUCOSE 133* 112*  BUN 42* 41*  CREATININE 1.36* 1.24*  CALCIUM 9.9 10.5*   PT/INR Recent Labs    06/19/22 1613  LABPROT 13.3  INR 1.0    Studies/Results: No results found.  Anti-infectives: Anti-infectives (From admission, onward)    Start     Dose/Rate Route Frequency Ordered Stop   06/20/22 1200  Ampicillin-Sulbactam (UNASYN) 3 g in sodium chloride 0.9 % 100 mL IVPB        3 g 200 mL/hr over 30 Minutes Intravenous Every 12 hours 06/20/22 0815     06/16/22 0800  Ampicillin-Sulbactam (UNASYN) 3 g in sodium chloride 0.9 % 100 mL  IVPB  Status:  Discontinued        3 g 200 mL/hr over 30 Minutes Intravenous Every 6 hours 06/16/22 0756 06/20/22 0815   06/13/22 1400  Ampicillin-Sulbactam (UNASYN) 3 g in sodium chloride 0.9 % 100 mL IVPB  Status:  Discontinued        3 g 200 mL/hr over 30 Minutes Intravenous Every 8 hours 06/13/22 0821 06/13/22 0823   06/13/22 1000  Ampicillin-Sulbactam (UNASYN) 3 g in sodium chloride 0.9 % 100 mL IVPB  Status:  Discontinued        3 g 200 mL/hr over 30 Minutes  Intravenous Every 12 hours 06/13/22 0823 06/16/22 0756   06/11/22 2200  piperacillin-tazobactam (ZOSYN) IVPB 3.375 g  Status:  Discontinued        3.375 g 12.5 mL/hr over 240 Minutes Intravenous Every 8 hours 06/11/22 1415 06/13/22 0821   06/11/22 1500  piperacillin-tazobactam (ZOSYN) IVPB 3.375 g        3.375 g 100 mL/hr over 30 Minutes Intravenous  Once 06/11/22 1410 06/12/22 0940   06/07/22 0200  fluconazole (DIFLUCAN) IVPB 200 mg  Status:  Discontinued        200 mg 100 mL/hr over 60 Minutes Intravenous Every 24 hours 06/06/22 0901 06/06/22 1031   06/06/22 1200  micafungin (MYCAMINE) 100 mg in sodium chloride 0.9 % 100 mL IVPB        100 mg 105 mL/hr over 1 Hours Intravenous Every 24 hours 06/06/22 1031 06/20/22 1528   06/05/22 1800  piperacillin-tazobactam (ZOSYN) IVPB 3.375 g        3.375 g 12.5 mL/hr over 240 Minutes Intravenous Every 12 hours 06/05/22 0825 06/09/22 2000   06/04/22 0600  piperacillin-tazobactam (ZOSYN) IVPB 3.375 g  Status:  Discontinued        3.375 g 12.5 mL/hr over 240 Minutes Intravenous Every 8 hours 06/04/22 0219 06/05/22 0825   06/04/22 0315  fluconazole (DIFLUCAN) IVPB 200 mg  Status:  Discontinued        200 mg 100 mL/hr over 60 Minutes Intravenous Every 24 hours 06/04/22 0217 06/04/22 0219   06/04/22 0315  fluconazole (DIFLUCAN) IVPB 200 mg  Status:  Discontinued        200 mg 100 mL/hr over 60 Minutes Intravenous Every 24 hours 06/04/22 0219 06/06/22 0901   06/03/22 2130  ceFEPIme (MAXIPIME) 2 g in sodium chloride 0.9 % 100 mL IVPB        2 g 200 mL/hr over 30 Minutes Intravenous  Once 06/03/22 2127 06/03/22 2209   06/03/22 2130  metroNIDAZOLE (FLAGYL) IVPB 500 mg  Status:  Discontinued        500 mg 100 mL/hr over 60 Minutes Intravenous Every 12 hours 06/03/22 2127 06/04/22 0217       Assessment/Plan: s/p Procedure(s): POD 18; EXPLORATORY LAPAROTOMY with omental patch   Patient is a 76 y.o. F who underwent an exploratory laparotomy with  omental patch and presented with septic shock.   - PRN pain and antiemetics - Patient on room air, regular work of breathing; Hemodynamically stable - Patient was transferred back to 300 floor yesterday (7/19) from ICU - Repeat CT Monday 7/17 showed continued leak with some air and contrast under the omentum tracking to midline. Plan for repeat CT in 2 weeks 07/03/22 - Surgical wound appears clean and staples out with inferior section of wound open, packed with saline gauze. Dressing changed today - wound with more mucous and less dried blood. 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

## 2022-06-22 NOTE — Progress Notes (Signed)
PROGRESS NOTE  Carolyn Erickson BHA:[id number] DOB: September 17, 1946 DOA: 06/03/2022 PCP: Dettinger, Fransisca Kaufmann, MD  Brief History:  76 year old female with history of major neurocognitive disorder, hypertension, hyperlipidemia, depression, left breast cancer presenting with generalized weakness and abdominal pain.  Apparently, the patient has been having abdominal pain since April 2023.  The patient had been started on pantoprazole at that time.  There was no improvement.  She followed up with her PCP on 06/02/2022.  Referral was given to see gastroenterology.  Abdominal pain had worsened over the past 2 days prior to admission with worsening generalized weakness.  There is no fevers, chills, chest pain, shortness breath, vomiting, diarrhea.  There is no hematochezia or melena noted.  In the emergency department, the patient was hypotensive with a blood pressure of 71/54.  She was somnolent.  CT of the abdomen and pelvis was performed and showed changes consistent with a perforated gastric ulcer.  Dr. Constance Haw was consulted, and the patient was taken to the OR for exploratory laparotomy, gastrorrhaphy, omental patch.  A left IJ central line was also placed.  TRH is consulted for medical management  In the ED, the patient had low-grade fever 99.5 F.  She was tachycardic and hypotensive with blood pressure 68/53.  WBC 4.1, hemoglobin 14.6, platelets 308,000.  BMP showed sodium 137, potassium 4.1, bicarbonate 22, serum creatinine 2.14.  Postoperatively, the patient was started on Zosyn and fluconazole.  She remained on the ventilator.  Preoperative chest x-ray showed bibasilar atelectasis.  There was free air under the diaphragm.   Assessment and Plan: * Septic shock - RESOLVED Presented with hypotension, tachycardia, and lactic acid peaked at 6.8 Pt successfully weaned off norepinephrine infusion Secondary to gastric perforation with peritonitis - Patient s/p Ex lap, gastrorrhaphy and omental patch.   06/03/22 blood cultures--C. glabrata Continue IV Unasyn per ID (plan is to treat for a total of 5 days after infection is/leakage controlled). discontinued fluconazole per ID,  -continue echinocandin (micafungin; with plans to treat for a total of 14 days, after blood cultures negative) -Leukocytosis persist, trending down very slowly -Urine  culture NGTD -Repeat CT on  06/19/22 shows persistent Leak and Peritonitis.   BRBPR -with acute on chronic anemia due to acute blood loss --Pt with multiple episodes of BRBPR after her CT Abd/Pelvis with oral and iv Contrast -Hgb down to 8.3 from 9.2 yesterday -continue to follow trend and transfuse as indicated. -Monitor H&H and transfuse as needed -Only 1 bright red blood per rectum; hemoglobin level has remained stable.  Gastric perforation ([place]) Suspect this may be due to chronic NSAID use -Continue PPI twice daily -06/04/2022--ex laparotomy with omental patch -Postoperative care per Dr. Constance Haw -TNA therapy on hold temporarily due to need for CVL holiday -UGI 7/6 - no leak seen.  7/9 - CT c/w leak present - PICC line placed 7/10 and TPN started 7/10 -Repeat CT on  06/19/22 shows persistent Leak and Peritonitis. -Plan is to repeat another CT abdomen and pelvis with -Contrast on 07/03/2022 probably at [facility] if patient is already discharge at that time  Nutrition----n.p.o. except for medications with spoonful of applesauce -Continue IV TPN  Fungemia 06/03/22 blood cultures--Candida glabrata -d/c fluconazole per ID consult -continue echinocandin per ID  -discussed with general surgery>>removed central line 7/4 for line holiday. -repeat blood culture on 06/06/22 NGTD  Acute renal failure superimposed on stage 3a chronic kidney disease (HCC) -Serum creatinine peaked at 2.28 but trending  down  -Secondary to hemodynamic changes/sepsis and hypovolemia -Creatinine much improved (currently in the 1.2 range) -renally adjust medications, avoid nephrotoxic  agents / dehydration  / hypotension  Leukocytosis -- WBC peaked at 31 K -Leukocytosis persist, trending down very slowly (currently in the 16 K range) -- appreciate ID and surgery recommendations,  -continue antifungal and Unasyn  Hyperbilirubinemia -- secondary to persistent leak.   -Continuing to monitor bilirubin trend.  -Follow general surgery recommendations and care.  Volume overload -- treated with IV lasix 40 mg x 2 on 7/7 and resolved since then. -Continue to follow volume status.  Hypomagnesemia/hypokalemia -resolved with replacement -Continue to follow ultralights trend.  Acute respiratory failure with hypoxia (HCC) Currently PRVC, RR 20, Vt 380, FiO2 0.4 -7/3 personally reviewed CXR--increased interstitial marking -7/3 ABG--7.35/36/107/20 (0.4) -7/3--extubated -7/4-5--stable on 4L [place] -7/6 - down to 2L/min [place] -Currently oxygen weaned off completely and demonstrating good oxygen saturation on room air.  Major neurocognitive disorder/dementia/depression Patient had MoCa 17/30 on 03/14/22 -Continue donezepil, venlafaxine, bupropion   -Continue constant reorientation.  GERD (gastroesophageal reflux disease) -Continue PPI.  Essential hypertension -amlodipine 2.5 mg daily -Blood pressure stable.  Mixed hyperlipidemia -c/n atorvastatin   Social/Ethics--- plan of care discussed with patient's husband multiple times almost on a daily basis usually at bedside.... Patient's husband accurately verbalizes understanding of the plan of care -DNR/DNI -Overall prognosis is guarded.  Dispo--LTAC around 06/22/2022 if remains hemodynamically stable.  Family Communication:   Discussed with husband and multiple family members at bedside  consultants:  General surgery is primary service, hospitalist service is consulting.  Code Status: DNR  DVT Prophylaxis:  SCDs  Procedures: As Listed in Progress Note Above  Antibiotics: Fluconazole 7/2>>7/4 Micafungin 7/4>> Zosyn  7/2>>7/11 Unasyn 7/11>>  Subjective: No fever, no chest pain, no nausea, no vomiting.  Intermittently oriented x2 and following commands appropriately.  No bloody bowel movement overnight reported.  Objective: Vitals:   06/21/22 2051 06/21/22 2052 06/22/22 0441 06/22/22 1301  BP: (!) 145/78  (!) 155/82 (!) 150/76  Pulse: 82 90 79 77  Resp: '20  16 16  '$ Temp:  97.9 F (36.6 C) 98.1 F (36.7 C) 97.8 F (36.6 C)  TempSrc:  Oral  Oral  SpO2: 100% 99% 98% 100%  Weight:      Height:        Intake/Output Summary (Last 24 hours) at 06/22/2022 1734 Last data filed at 06/22/2022 1400 Gross per 24 hour  Intake 797.54 ml  Output 1400 ml  Net -602.46 ml   Physical Exam General exam: Alert, awake, oriented x 2 intermittently; no overnight events.  No nausea, no vomiting; patient is afebrile and no bloody bowel movements reported overnight. Respiratory system: Clear to auscultation. Respiratory effort normal.  No using accessory muscle.  Good saturation on room air. Cardiovascular system:RRR. No rubs or gallops; no JVD. Gastrointestinal system: Abdomen is nondistended, soft and nontender positive bowel sounds.  JP drain with light yellow fluid and no blood; wound is clean dry and intact. Central nervous system: No focal neurologic deficit. Extremities: No cyanosis or clubbing. Skin: No petechiae; right upper extremity PICC line in place. Psychiatry: Judgement and insight appear normal. Mood & affect appropriate.    Data Reviewed: I have personally reviewed following labs and imaging studies  Basic Metabolic Panel: Recent Labs  Lab 06/17/22 0546 06/18/22 0617 06/19/22 0606 06/20/22 0409 06/22/22 0605  NA 145 144 143 142 142  K 4.8 4.0 4.6 4.1 3.6  CL 111 111 111 111 110  CO2 '29 27 25 26 27  '$ GLUCOSE 146* 98 130* 133* 112*  BUN 40* 39* 40* 42* 41*  CREATININE 1.23* 1.21* 1.18* 1.36* 1.24*  CALCIUM 10.7* 10.4* 10.4* 9.9 10.5*  MG  --  1.9 2.2  --  2.0  PHOS  --  3.7 4.0  --  3.9    Liver Function Tests: Recent Labs  Lab 06/17/22 0546 06/19/22 0606 06/20/22 0409 06/22/22 0605  AST 54* 58* 41 53*  ALT 41 46* 37 46*  ALKPHOS 181* 203* 139* 190*  BILITOT 4.4* 3.7* 2.7* 2.9*  PROT 6.2* 6.3* 5.6* 6.2*  ALBUMIN 1.7* 1.7* <1.5* 1.6*   Coagulation Profile: Recent Labs  Lab 06/19/22 1613  INR 1.0   CBC: Recent Labs  Lab 06/16/22 0609 06/17/22 0546 06/19/22 0606 06/19/22 1613 06/19/22 1848 06/19/22 2300 06/20/22 0409 06/21/22 0407 06/22/22 0605  WBC 24.4*   < > 19.0*  --  19.0*  --  18.3* 16.0* 17.8*  NEUTROABS 20.1*  --   --   --   --   --  13.8*  --  13.0*  HGB 11.3*   < > 11.4*   < > 10.0* 9.4* 9.2* 8.3* 9.0*  HCT 34.5*   < > 37.3   < > 31.1* 28.6* 28.6* 25.1* 28.3*  MCV 93.0   < > 95.6  --  94.0  --  92.6 93.0 95.0  PLT 527*   < > 498*  --  562*  --  465* 419* 427*   < > = values in this interval not displayed.   CBG: Recent Labs  Lab 06/21/22 1207 06/21/22 2056 06/22/22 0009 06/22/22 0713 06/22/22 1554  GLUCAP 149* 123* 116* 111* 113*   HbA1C: No results for input(s): "HGBA1C" in the last 72 hours.  Urine analysis:    Component Value Date/Time   COLORURINE AMBER (A) 06/04/2022 0520   APPEARANCEUR CLOUDY (A) 06/04/2022 0520   APPEARANCEUR Cloudy (A) 04/13/2021 1530   LABSPEC 1.027 06/04/2022 0520   PHURINE 5.0 06/04/2022 0520   GLUCOSEU NEGATIVE 06/04/2022 0520   HGBUR NEGATIVE 06/04/2022 0520   BILIRUBINUR NEGATIVE 06/04/2022 0520   BILIRUBINUR Negative 04/13/2021 1530   KETONESUR NEGATIVE 06/04/2022 0520   PROTEINUR 100 (A) 06/04/2022 0520   NITRITE NEGATIVE 06/04/2022 0520   LEUKOCYTESUR SMALL (A) 06/04/2022 0520    No results found for this or any previous visit (from the past 240 hour(s)).   Scheduled Meds:  sodium chloride   Intravenous Once   amLODipine  2.5 mg Oral Daily   atorvastatin  20 mg Oral QPM   buPROPion  150 mg Oral BID   Chlorhexidine Gluconate Cloth  6 each Topical Daily   donepezil  10 mg Oral QHS    insulin aspart  0-9 Units Subcutaneous Q8H   mouth rinse  15 mL Mouth Rinse Q4H   pantoprazole (PROTONIX) IV  40 mg Intravenous Q12H   sodium chloride flush  10-40 mL Intracatheter Q12H   venlafaxine  37.5 mg Oral BID   Continuous Infusions:  ampicillin-sulbactam (UNASYN) IV 3 g (06/22/22 1205)   TPN ADULT (ION) 70 mL/hr at 06/22/22 0150   TPN ADULT (ION)      Procedures/Studies: CT ABDOMEN PELVIS W CONTRAST  Result Date: 06/19/2022 CLINICAL DATA:  Peritonitis, gastric perforation, status post laparotomy and omental patch, contained leak evaluate for continued leak and abscess formation EXAM: CT ABDOMEN AND PELVIS WITH CONTRAST TECHNIQUE: Multidetector CT imaging of the abdomen and pelvis was performed using the  standard protocol following bolus administration of intravenous contrast. RADIATION DOSE REDUCTION: This exam was performed according to the departmental dose-optimization program which includes automated exposure control, adjustment of the mA and/or kV according to patient size and/or use of iterative reconstruction technique. CONTRAST:  83m OMNIPAQUE IOHEXOL 300 MG/ML SOLN, additional oral enteric contrast COMPARISON:  06/11/2022 FINDINGS: Lower chest: Moderate bilateral pleural effusions and associated atelectasis or consolidation, similar to prior examination. Cardiomegaly. Coronary artery calcifications. Hepatobiliary: No solid liver abnormality is seen. No gallstones, gallbladder wall thickening, or biliary dilatation. Pancreas: Unremarkable. No pancreatic ductal dilatation or surrounding inflammatory changes. Spleen: Normal in size without significant abnormality. Adrenals/Urinary Tract: Adrenal glands are unremarkable. Kidneys are normal, without renal calculi, solid lesion, or hydronephrosis. Bladder is unremarkable. Stomach/Bowel: Mucosal thickening of the gastric body and antrum. Persistent air and fluid collection in the ventral abdomen overlying the gastric antrum containing  enteric contrast and communicating with the ventral gastric antrum, measuring 6.1 x 2.3 cm (series 2, image 37). This collection may further communicate to midline laparotomy incision, which contains air loculations (series 2, image 37). Appendix appears normal. No evidence of bowel wall thickening, distention, or inflammatory changes. Vascular/Lymphatic: Aortic atherosclerosis. No enlarged abdominal or pelvic lymph nodes. Reproductive: Status post hysterectomy. Other: Severe anasarca. Status post midline laparotomy, as above containing air loculations within the incision (series 2, image 35). Small volume perihepatic and perisplenic ascites, similar to prior examination, however now with clear, diffuse peritoneal thickening and hyperenhancement (series 2, image 23). Fluid loculation in the central small bowel mesentery measuring 3.9 x 2.0 cm (series 2, image 56) well as in the low pelvis measuring 3.5 x 3.5 cm (series 2, image 65). Surgical drain about the ventral abdomen. Musculoskeletal: No acute or significant osseous findings. IMPRESSION: 1. Persistent air and fluid collection in the ventral abdomen overlying the gastric antrum containing enteric contrast and communicating with the ventral gastric antrum, measuring 6.1 x 2.3 cm. This collection may further communicate to midline laparotomy incision, which contains air loculations. 2. Small volume perihepatic and perisplenic ascites, similar to prior examination, however now with clear, diffuse peritoneal thickening and hyperenhancement. Additional fluid collections in the central small bowel mesentery and low pelvis. Findings are consistent with peritonitis, and the presence or absence of infection within these fluid collections is not established by CT. 3. Moderate bilateral pleural effusions and associated atelectasis or consolidation, similar to prior examination. 4. Severe anasarca. 5. Coronary artery disease. Aortic Atherosclerosis (ICD10-I70.0).  Electronically Signed   By: ADelanna AhmadiM.D.   On: 06/19/2022 11:55   UKoreaAbdomen Limited RUQ (LIVER/GB)  Result Date: 06/12/2022 CLINICAL DATA:  Hyperbilirubinemia EXAM: ULTRASOUND ABDOMEN LIMITED RIGHT UPPER QUADRANT COMPARISON:  CT abdomen and pelvis 06/11/2022 FINDINGS: Gallbladder: Sludge within gallbladder. No definite shadowing calculi. Mild gallbladder wall thickening. No sonographic Murphy sign. Common bile duct: Diameter: 3 mm, normal Liver: Normal echogenicity without mass or nodularity. No intrahepatic biliary dilatation. Portal vein is patent on color Doppler imaging with normal direction of blood flow towards the liver. Other: Perihepatic ascites identified, containing septations/loculations. IMPRESSION: Sludge within gallbladder with mild gallbladder wall thickening, a nonspecific finding in the setting of ascites. No biliary dilatation or focal hepatic abnormality. Electronically Signed   By: MLavonia DanaM.D.   On: 06/12/2022 14:40   UKoreaEKG SITE RITE  Result Date: 06/11/2022 If Site Rite image not attached, placement could not be confirmed due to current cardiac rhythm.  CT ABDOMEN PELVIS WO CONTRAST  Result Date: 06/11/2022 CLINICAL DATA:  EXPLORATORY LAPAROTOMY, omental patchImpression: Worsening leukocytosis. EXAM: CT ABDOMEN AND PELVIS WITHOUT CONTRAST TECHNIQUE: Multidetector CT imaging of the abdomen and pelvis was performed following the standard protocol without IV contrast. RADIATION DOSE REDUCTION: This exam was performed according to the departmental dose-optimization program which includes automated exposure control, adjustment of the mA and/or kV according to patient size and/or use of iterative reconstruction technique. COMPARISON:  06/03/2022. FINDINGS: Lower chest: Moderate pleural effusions. Dependent lower lobe and right middle lobe opacity, likely atelectasis. Additional ground-glass opacities are noted, most evident in the right middle lobe, suspicious for infection.  Effusions are increased on the left and new on the right since the prior CT. Lung base opacities are mostly new. Hepatobiliary: No focal liver abnormality is seen. No gallstones, gallbladder wall thickening, or biliary dilatation. Pancreas: Unremarkable. No pancreatic ductal dilatation or surrounding inflammatory changes. Spleen: Normal in size without focal abnormality. Adrenals/Urinary Tract: No adrenal masses. No renal masses or hydronephrosis. Bladder is unremarkable. Stomach/Bowel: Stomach is mostly decompressed. There is an apparent defect along the anterior aspect of the mid to distal stomach through which contrast and air has collected. The ill-defined collection of contrast and air lies in the mid to upper abdomen near the midline, with an adjacent surgical drain, and deep to the midline laparotomy incision. Poorly defined small bowel loops in the central abdomen. There is no colon or small bowel dilation to suggest obstruction. No colonic wall thickening or convincing inflammation. Vascular/Lymphatic: Aortic atherosclerosis. Prominent lymph nodes noted along the gastrohepatic ligament. There are no enlarged lymph nodes. Reproductive: Status post hysterectomy. No adnexal masses. Other: Small amount of ascites. Generalized increased attenuation of the mesenteric/peritoneal fat consistent with edema. Diffuse subcutaneous soft tissue edema. Musculoskeletal: No acute finding. IMPRESSION: 1. There is no defined fluid collection to indicate an abscess. 2. There is extraluminal air and contrast in the anterior central to upper abdomen, that appears to be arising from a defect along the anterior gastric wall. The extraluminal contrast and air lies adjacent to the surgical drain. 3. Small amount of ascites is similar to the previous CT. 4. Mesenteric/peritoneal and diffuse subcutaneous edema. 5. Moderate pleural effusions with associated lung base atelectasis. Additional areas of ground-glass lung opacity are  suspicious for multifocal pneumonia. Electronically Signed   By: Lajean Manes M.D.   On: 06/11/2022 12:56   DG CHEST PORT 1 VIEW  Result Date: 06/09/2022 CLINICAL DATA:  Weakness, wheezing EXAM: PORTABLE CHEST 1 VIEW COMPARISON:  06/05/2022 FINDINGS: Interval endotracheal and esophagogastric extubation. Small to moderate bilateral pleural effusions, increased compared to prior examination, with associated atelectasis or consolidation and diffuse bilateral interstitial pulmonary opacity. Cardiomegaly. IMPRESSION: 1. Small to moderate bilateral pleural effusions, increased compared to prior examination, with associated atelectasis or consolidation and diffuse bilateral interstitial pulmonary opacity. Findings are most consistent with worsened edema. 2. Interval endotracheal and esophagogastric extubation. 3. Cardiomegaly. Electronically Signed   By: Delanna Ahmadi M.D.   On: 06/09/2022 12:25   DG UGI W SMALL BOWEL  Result Date: 06/08/2022 CLINICAL DATA:  Post repair of 1.5 cm diameter perforated anterior wall gastric antral ulcer EXAM: WATER SOLUBLE UPPER GI SERIES TECHNIQUE: Single-column upper GI series was performed using water soluble contrast. Radiation Exposure Index (as provided by the fluoroscopic device): 43.1 mGy Kerma CONTRAST:  100 cc Omnipaque 300 via NG tube COMPARISON:  CT abdomen and pelvis 06/03/2022 FLUOROSCOPY: Fluoroscopy Time:  2 minutes 36 seconds Radiation Exposure Index (if provided by the fluoroscopic device): 43.1 Number of Acquired Spot Images: Multiple  fluoroscopic screen captures and cine series FINDINGS: Contrast administered via NG tube opacifies the gastric lumen. Patent pylorus with passage of contrast through normal appearing duodenal C loop into proximal jejunum with incidentally noted duodenal diverticulum at second portion. Irregularity of rugal folds with thickening and distortion at anterior wall of gastric antrum at site of perforation repair. The third portion of the  duodenum is superimposed with the inferior wall of the gastric antrum, mildly limiting exam. A blush of contrast is seen inferior to the antrum, superimposed with the third portion of the duodenum throughout the study. This appears to move in concert with the duodenum with respiration. No contrast opacification of the JP drain. No gross evidence of contrast leak identified though it is difficult to completely exclude a small leak at the site of repair. IMPRESSION: Distortion and thickening of rugal folds at site of antral gastric ulcer repair. No gross evidence of leak at the site of ulcer repair as discussed above. Electronically Signed   By: Lavonia Dana M.D.   On: 06/08/2022 15:32   ECHOCARDIOGRAM COMPLETE  Result Date: 06/07/2022    ECHOCARDIOGRAM REPORT   Patient Name:   Carolyn Erickson Date of Exam: 06/07/2022 Medical Rec #:  [id number]       Height:       61.0 in Accession #:    [id number]      Weight:       112.2 lb Date of Birth:  1946-03-25        BSA:          1.477 m Patient Age:    65 years        BP:           116/46 mmHg Patient Gender: F               HR:           104 bpm. Exam Location:  [facility] Procedure: 2D Echo, Cardiac Doppler and Color Doppler Indications:    Bacteremia  History:        Patient has no prior history of Echocardiogram examinations.                 Signs/Symptoms:Bacteremia; Risk Factors:Hypertension and                 Dyslipidemia. Breast CA.  Sonographer:    Wenda Low Referring Phys: [id number] [place]  1. Abnormal septal motion . Left ventricular ejection fraction, by estimation, is 55 to 60%. The left ventricle has normal function. The left ventricle has no regional wall motion abnormalities. Left ventricular diastolic parameters were normal.  2. Right ventricular systolic function is normal. The right ventricular size is normal. There is mildly elevated pulmonary artery systolic pressure.  3. Moderate pleural effusion in the left lateral region.  4. The  mitral valve is abnormal. Trivial mitral valve regurgitation. No evidence of mitral stenosis.  5. Thickening and calcification noted . The tricuspid valve is abnormal.  6. Some thickening in the LVOT near intervalvular fibrosa Given this and nodular calcification of PV suggest TEE if suspicion for SBE high. The aortic valve is tricuspid. There is moderate calcification of the aortic valve. There is moderate thickening of the aortic valve. Aortic valve regurgitation is mild. Aortic valve sclerosis/calcification is present, without any evidence of aortic stenosis.  7. Nodular calcification seen on PV. The pulmonic valve was abnormal.  8. The inferior vena cava is normal in size with greater than  50% respiratory variability, suggesting right atrial pressure of 3 mmHg. FINDINGS  Left Ventricle: Abnormal septal motion. Left ventricular ejection fraction, by estimation, is 55 to 60%. The left ventricle has normal function. The left ventricle has no regional wall motion abnormalities. The left ventricular internal cavity size was normal in size. There is no left ventricular hypertrophy. Left ventricular diastolic parameters were normal. Right Ventricle: The right ventricular size is normal. No increase in right ventricular wall thickness. Right ventricular systolic function is normal. There is mildly elevated pulmonary artery systolic pressure. The tricuspid regurgitant velocity is 3.21  m/s, and with an assumed right atrial pressure of 3 mmHg, the estimated right ventricular systolic pressure is 24.2 mmHg. Left Atrium: Left atrial size was normal in size. Right Atrium: Right atrial size was normal in size. Pericardium: There is no evidence of pericardial effusion. Mitral Valve: The mitral valve is abnormal. There is mild thickening of the mitral valve leaflet(s). There is mild calcification of the mitral valve leaflet(s). Mild mitral annular calcification. Trivial mitral valve regurgitation. No evidence of mitral valve  stenosis. MV peak gradient, 3.9 mmHg. The mean mitral valve gradient is 1.0 mmHg. Tricuspid Valve: Thickening and calcification noted. The tricuspid valve is abnormal. Tricuspid valve regurgitation is trivial. No evidence of tricuspid stenosis. Aortic Valve: Some thickening in the LVOT near intervalvular fibrosa Given this and nodular calcification of PV suggest TEE if suspicion for SBE high. The aortic valve is tricuspid. There is moderate calcification of the aortic valve. There is moderate thickening of the aortic valve. Aortic valve regurgitation is mild. Aortic valve sclerosis/calcification is present, without any evidence of aortic stenosis. Aortic valve mean gradient measures 5.0 mmHg. Aortic valve peak gradient measures 10.3 mmHg. Aortic valve area, by VTI measures 2.02 cm. Pulmonic Valve: Nodular calcification seen on PV. The pulmonic valve was abnormal. Pulmonic valve regurgitation is trivial. No evidence of pulmonic stenosis. Aorta: The aortic root is normal in size and structure. Venous: The inferior vena cava is normal in size with greater than 50% respiratory variability, suggesting right atrial pressure of 3 mmHg. IAS/Shunts: No atrial level shunt detected by color flow Doppler. Additional Comments: There is a moderate pleural effusion in the left lateral region.  LEFT VENTRICLE PLAX 2D LVIDd:         4.50 cm     Diastology LVIDs:         3.00 cm     LV e' medial:    7.29 cm/s LV PW:         0.80 cm     LV E/e' medial:  11.8 LV IVS:        0.80 cm     LV e' lateral:   9.79 cm/s LVOT diam:     1.80 cm     LV E/e' lateral: 8.8 LV SV:         55 LV SV Index:   37 LVOT Area:     2.54 cm  LV Volumes (MOD) LV vol d, MOD A2C: 43.7 ml LV vol d, MOD A4C: 52.7 ml LV vol s, MOD A2C: 17.3 ml LV vol s, MOD A4C: 23.8 ml LV SV MOD A2C:     26.4 ml LV SV MOD A4C:     52.7 ml LV SV MOD BP:      28.6 ml RIGHT VENTRICLE RV Basal diam:  3.10 cm RV Mid diam:    2.90 cm RV S prime:     14.50 cm/s TAPSE (M-mode): 3.0 cm  LEFT ATRIUM             Index        RIGHT ATRIUM           Index LA diam:        3.60 cm 2.44 cm/m   RA Area:     11.50 cm LA Vol (A2C):   50.9 ml 34.45 ml/m  RA Volume:   21.80 ml  14.76 ml/m LA Vol (A4C):   43.7 ml 29.58 ml/m LA Biplane Vol: 49.8 ml 33.71 ml/m  AORTIC VALVE                     PULMONIC VALVE AV Area (Vmax):    1.92 cm      PV Vmax:       0.98 m/s AV Area (Vmean):   1.84 cm      PV Peak grad:  3.9 mmHg AV Area (VTI):     2.02 cm AV Vmax:           160.50 cm/s AV Vmean:          103.550 cm/s AV VTI:            0.274 m AV Peak Grad:      10.3 mmHg AV Mean Grad:      5.0 mmHg LVOT Vmax:         121.00 cm/s LVOT Vmean:        74.800 cm/s LVOT VTI:          0.218 m LVOT/AV VTI ratio: 0.79  AORTA Ao Root diam: 3.30 cm MITRAL VALVE               TRICUSPID VALVE MV Area (PHT): 6.02 cm    TR Peak grad:   41.2 mmHg MV Area VTI:   2.25 cm    TR Vmax:        321.00 cm/s MV Peak grad:  3.9 mmHg MV Mean grad:  1.0 mmHg    SHUNTS MV Vmax:       0.99 m/s    Systemic VTI:  0.22 m MV Vmean:      48.8 cm/s   Systemic Diam: 1.80 cm MV Decel Time: 126 msec MV E velocity: 86.00 cm/s MV A velocity: 72.70 cm/s MV E/A ratio:  1.18 Jenkins Rouge MD Electronically signed by Jenkins Rouge MD Signature Date/Time: 06/07/2022/10:28:36 AM    Final    DG CHEST PORT 1 VIEW  Result Date: 06/05/2022 CLINICAL DATA:  76 year old female with respiratory failure. Intubated. EXAM: PORTABLE CHEST 1 VIEW COMPARISON:  Portable chest 06/04/2022 and earlier. FINDINGS: Portable AP semi upright view at 0527 hours. Endotracheal tube tip in good position between the clavicles and carina. Stable left IJ central line and visible enteric tube. Increased left lung base and retrocardiac opacity now mostly obscuring the left hemidiaphragm. Slightly lower lung volumes overall. Mediastinal contours remain normal. No pneumothorax, pulmonary edema, or definite effusion. Negative right lung. Partially visible midline abdominal skin staples.  Paucity of bowel gas in the upper abdomen. Stable left chest and axillary surgical clips. IMPRESSION: 1. Stable lines and tubes. 2. Increased left lower lobe collapse or consolidation since yesterday. Electronically Signed   By: Genevie Ann M.D.   On: 06/05/2022 08:26   DG Chest Port 1 View  Result Date: 06/04/2022 CLINICAL DATA:  Endotracheal tube, nasogastric tube and central line placement. EXAM: PORTABLE CHEST 1 VIEW COMPARISON:  June 03, 2022 FINDINGS: Since the prior study  there has been interval placement of an endotracheal tube. Its distal tip is seen approximately 3.8 cm from the carina. Interval nasogastric tube placement is also seen. Its distal and extends into the gastric lumen. There is a left internal jugular venous catheter with its distal tip overlying the distal superior vena cava. This is approximately 6 mm proximal to the junction of the superior vena cava and right atrium. The heart size and mediastinal contours are within normal limits. Mild to moderate severity atelectasis and/or infiltrate is seen within the left lung base. This represents a new finding when compared to the prior study. There is no evidence of a pleural effusion or pneumothorax. Radiopaque surgical clips are seen along the left axilla. The air seen below the right hemidiaphragm on the prior study is no longer visualized. The visualized skeletal structures are unremarkable. IMPRESSION: 1. Interval endotracheal tube, nasogastric tube and left internal jugular venous catheter placement positioning, as described above. 2. Mild to moderate severity left basilar atelectasis and/or infiltrate. Electronically Signed   By: Virgina Norfolk M.D.   On: 06/04/2022 02:47   CT Abdomen Pelvis Wo Contrast  Result Date: 06/03/2022 CLINICAL DATA:  Severe weakness for 2 days with acute abdominal pain, initial encounter EXAM: CT ABDOMEN AND PELVIS WITHOUT CONTRAST TECHNIQUE: Multidetector CT imaging of the abdomen and pelvis was performed  following the standard protocol without IV contrast. RADIATION DOSE REDUCTION: This exam was performed according to the departmental dose-optimization program which includes automated exposure control, adjustment of the mA and/or kV according to patient size and/or use of iterative reconstruction technique. COMPARISON:  None Available. FINDINGS: Lower chest: Basilar atelectatic changes are noted. Hepatobiliary: Liver is within normal limits. Gallbladder demonstrates a normal appearance. Pancreas: Unremarkable. No pancreatic ductal dilatation or surrounding inflammatory changes. Spleen: Normal in size without focal abnormality. Adrenals/Urinary Tract: Adrenal glands are within normal limits. Kidneys demonstrate tiny nonobstructing stones on right in the lower pole. No ureteral obstruction is seen. The bladder is decompressed. Stomach/Bowel: Considerable fecal material is noted within the rectal vault which may represent a focal impaction. Diverticular change of the colon is noted as well as some generalized wall thickening throughout the colon. The appendix is within normal limits. Small bowel is within normal limits. Stomach demonstrates a defect in the anterior gastric wall consistent with a perforated ulcer. There is a considerable amount of air and fluid throughout the abdomen with apparent direct communication with the gastric best seen on image number 36 of series 2 and image number 78 of series 6. Vascular/Lymphatic: Aortic atherosclerosis. No enlarged abdominal or pelvic lymph nodes. Reproductive: Status post hysterectomy. No adnexal masses. Other: Free air and free fluid similar to that described above throughout the abdomen. The bowel demonstrates some reactive wall thickening related to the fluid and air. Musculoskeletal: Degenerative changes of the lumbar spine are seen. No other bony abnormality is noted. IMPRESSION: Changes consistent with a perforated gastric ulcer anteriorly with considerable spillage  of fluid and air into the abdominal cavity. Some reactive wall thickening is noted within the bowel loops secondary to these changes. Critical Value/emergent results were called by telephone at the time of interpretation on 06/03/2022 at 10:43 pm to Dr. Aletta Edouard , who verbally acknowledged these results. Electronically Signed   By: Inez Catalina M.D.   On: 06/03/2022 22:45   DG Chest Port 1 View  Result Date: 06/03/2022 CLINICAL DATA:  Weakness. EXAM: PORTABLE CHEST 1 VIEW COMPARISON:  None Available. FINDINGS: Multiple overlying radiopaque cardiac lead wires  are seen. The heart size and mediastinal contours are within normal limits. Mild, diffuse, chronic appearing increased lung markings are seen. There is no evidence of an acute infiltrate, pleural effusion or pneumothorax. Radiopaque surgical clips are seen along the lateral aspect of the left chest wall. A crescentic area of air is seen just below the right hemidiaphragm. Degenerative changes seen throughout the thoracic spine. IMPRESSION: 1. Chronic appearing increased lung markings without evidence of acute or active cardiopulmonary disease. 2. Air seen just below the right hemidiaphragm which may represent an air filled loop of colon. Further evaluation with abdomen pelvis CT is recommended, as the presence of intra-abdominal free air cannot be excluded. Electronically Signed   By: Virgina Norfolk M.D.   On: 06/03/2022 21:22     Barton Dubois, MD How to contact the [facility] Attending or Consulting provider [place] or covering provider during after hours [place], for this patient?  Check the care team in [facility] and look for a) attending/consulting TRH provider listed and b) the [facility] team listed Log into [email or web address] and use [place]'s universal password to access. If you do not have the password, please contact the hospital operator. Locate the [facility] provider you are looking for under [facility] and page to a number that you can be directly  reached. If you still have difficulty reaching the provider, please page the [facility] (Director on Call) for the Hospitalists listed on amion for assistance.   [facility]  If 7PM-7AM, please contact night-coverage [email or web address] 06/22/2022, 5:34 PM   LOS: 18 days

## 2022-06-22 NOTE — Progress Notes (Signed)
PHARMACY - TOTAL PARENTERAL NUTRITION CONSULT NOTE   Indication: gastric perforation  Patient Measurements: Height: '5\' 1"'$  (154.9 cm) Weight: 54.3 kg (119 lb 11.4 oz) IBW/kg (Calculated) : 47.8 TPN AdjBW (KG): 57.7 Body mass index is 22.62 kg/m.  Assessment:  Pharmacy consulted to dose TPN in patient with gastric perforation.  Glucose / Insulin:    111-132- 2 units Electrolytes: K 3.6 Corrected calcium 12.4 Renal: Scr 1.24 Hepatic: albumin 1.6 Tbili 4.4> 3.7> 2.9 TG 114   GI Surgeries / Procedures:   Repeat CT on  06/19/22 shows persistent Leak and Peritonitis  Exploratory laparotomy, gastrorrhaphy  Central access: PICC double lumen 7/10 TPN start date: 7/10  Nutritional Goals: Goal TPN rate is 70 mL/hr (provides 77 g of protein and 1444 kcals per day)  RD Assessment: Kcal:  1400-1600 Protein:  75-80 gr  Fluid:  >/= 1600 ml daily  Current Nutrition:  NPO  Plan:  Continue TPN at 70 mL/hr at 1800 Electrolytes in TPN: Na 46mq/L, K 50 mEq/L, Ca 0 mEq/L, Mg 5 mEq/L, and Phos 10 mmol/L. Cl:Ac 1:1 Add 14 units inuslin in TPN - consider less CBG checks if patient tolerating w/out insulin needs  standard MVI and trace elements to TPN Continue Sensitive q8h SSI and adjust as needed  Monitor TPN labs on Mon/Thurs,   SMargot Ables PharmD Clinical Pharmacist 06/22/2022 8:21 AM

## 2022-06-22 NOTE — TOC Progression Note (Signed)
Transition of Care [facility]) - Progression Note    Patient Details  Name: Carolyn Erickson MRN: [id number] Date of Birth: July 26, 1946  Transition of Care [facility]) CM/SW [place], [place] Phone Number: 06/22/2022, 12:01 PM  Clinical Narrative:    TOC updated by Jaquelyn Bitter with [facility] that at this time insurance Josem Kaufmann is still pending. TOC to follow for updates.   Expected Discharge Plan: [place] Barriers to Discharge: Continued Medical Work up  Expected Discharge Plan and Services Expected Discharge Plan: [place] In-house Referral: Clinical Social Work   Post Acute Care Choice: [place] Living arrangements for the past 2 months: Single Family Home                                       Social Determinants of Health (SDOH) Interventions    Readmission Risk Interventions     No data to display

## 2022-06-23 DIAGNOSIS — K251 Acute gastric ulcer with perforation: Secondary | ICD-10-CM | POA: Diagnosis not present

## 2022-06-23 DIAGNOSIS — A419 Sepsis, unspecified organism: Secondary | ICD-10-CM | POA: Diagnosis not present

## 2022-06-23 DIAGNOSIS — N179 Acute kidney failure, unspecified: Secondary | ICD-10-CM | POA: Diagnosis not present

## 2022-06-23 DIAGNOSIS — J9601 Acute respiratory failure with hypoxia: Secondary | ICD-10-CM | POA: Diagnosis not present

## 2022-06-23 LAB — BPAM RBC
Blood Product Expiration Date: 202308092359
Blood Product Expiration Date: 202308142359
ISSUE DATE / TIME: 202307171904
Unit Type and Rh: 6200
Unit Type and Rh: 6200

## 2022-06-23 LAB — TYPE AND SCREEN
ABO/RH(D): A POS
Antibody Screen: NEGATIVE
Unit division: 0
Unit division: 0

## 2022-06-23 LAB — GLUCOSE, CAPILLARY
Glucose-Capillary: 128 mg/dL — ABNORMAL HIGH (ref 70–99)
Glucose-Capillary: 132 mg/dL — ABNORMAL HIGH (ref 70–99)
Glucose-Capillary: 135 mg/dL — ABNORMAL HIGH (ref 70–99)

## 2022-06-23 MED ORDER — SODIUM CHLORIDE 0.9 % IV SOLN: 3.0000 g | Freq: Two times a day (BID) | INTRAVENOUS | Status: AC

## 2022-06-23 MED ORDER — TRAVASOL 10 % IV SOLN
INTRAVENOUS | Status: DC
  Filled 2022-06-23: qty 772.8

## 2022-06-23 MED ORDER — ONDANSETRON HCL 4 MG/2ML IJ SOLN: 4.0000 mg | Freq: Four times a day (QID) | INTRAMUSCULAR | 0 refills | Status: AC | PRN

## 2022-06-23 MED ORDER — MORPHINE SULFATE (PF) 2 MG/ML IV SOLN
1.0000 mg | INTRAVENOUS | Status: AC | PRN
Start: 2022-06-23 — End: ?

## 2022-06-23 NOTE — Progress Notes (Signed)
Physical Therapy Treatment Patient Details Name: LORREN ROSSETTI MRN: [id number] DOB: 1946-05-25 Today's Date: 06/23/2022   History of Present Illness Ms. Kahan is a 76 yo with a history of HTN and depression who comes in with weakness and abdominal pain. She has been having some pain generalized for months being treated for gastritis/ ulcer with PPI by PCP and referred to GI. She has not seen GI. She has a history of diarrhea and constipation. She says over the last few days she was feeling weaker and her pain because worse and more severe acutely. She denies any chest pain or SOB. She has no fever. She has been hypotensive and required fluid bolus and is on levophed at 2 in the ED.    PT Comments    Patient demonstrates slow labored movement for sitting up at bedside, once seated able to maintain sitting balance while competing BLE ROM/strengthening exercises with frequent verbal/tactile cueing with fair carryover demonstrated.  Patient limited for activity mostly due to leaking from IV (TPN) with gown and bed wet - RN notified and patient put back to bed to be cleaned by nursing staff.  Patient will benefit from continued skilled physical therapy in hospital and recommended venue below to increase strength, balance, endurance for safe ADLs and gait.     Recommendations for follow up therapy are one component of a multi-disciplinary discharge planning process, led by the attending physician.  Recommendations may be updated based on patient status, additional functional criteria and insurance authorization.  Follow Up Recommendations  Skilled nursing-short term rehab (<3 hours/day) Can patient physically be transported by private vehicle: Yes   Assistance Recommended at Discharge Intermittent Supervision/Assistance  Patient can return home with the following A lot of help with walking and/or transfers;Help with stairs or ramp for entrance;A lot of help with bathing/dressing/bathroom;Assistance  with cooking/housework   Equipment Recommendations  None recommended by PT    Recommendations for Other Services       Precautions / Restrictions Precautions Precautions: Fall Restrictions Weight Bearing Restrictions: No     Mobility  Bed Mobility Overal bed mobility: Needs Assistance Bed Mobility: Supine to Sit, Sit to Supine Rolling: Mod assist   Supine to sit: Min guard, Min assist     General bed mobility comments: increased time, labored movement    Transfers                        Ambulation/Gait                   Stairs             Wheelchair Mobility    Modified Rankin (Stroke Patients Only)       Balance Overall balance assessment: Needs assistance Sitting-balance support: Feet supported, No upper extremity supported Sitting balance-Leahy Scale: Fair Sitting balance - Comments: seated at EOB Postural control: Posterior lean                                  Cognition Arousal/Alertness: Awake/alert Behavior During Therapy: Anxious, WFL for tasks assessed/performed Overall Cognitive Status: Within Functional Limits for tasks assessed                                          Exercises General Exercises - Lower Extremity Ankle  Circles/Pumps: Seated, AROM, Both, 10 reps Long Arc Quad: Seated, AAROM, Strengthening, Both, 10 reps Hip Flexion/Marching: Seated, AAROM, Strengthening, Both, 5 reps    General Comments        Pertinent Vitals/Pain Pain Assessment Pain Assessment: No/denies pain    Home Living                          Prior Function            PT Goals (current goals can now be found in the care plan section) Acute Rehab PT Goals Patient Stated Goal: return home ultimately PT Goal Formulation: With patient/family Time For Goal Achievement: 06/28/22 Potential to Achieve Goals: Good Progress towards PT goals: Progressing toward goals    Frequency    Min  3X/week      PT Plan Current plan remains appropriate    Co-evaluation              AM-PAC PT "6 Clicks" Mobility   Outcome Measure  Help needed turning from your back to your side while in a flat bed without using bedrails?: A Lot Help needed moving from lying on your back to sitting on the side of a flat bed without using bedrails?: A Lot Help needed moving to and from a bed to a chair (including a wheelchair)?: A Lot Help needed standing up from a chair using your arms (e.g., wheelchair or bedside chair)?: A Lot Help needed to walk in hospital room?: A Lot Help needed climbing 3-5 steps with a railing? : Total 6 Click Score: 11    End of Session   Activity Tolerance: Patient tolerated treatment well;Patient limited by fatigue Patient left: in bed;with call bell/phone within reach Nurse Communication: Mobility status PT Visit Diagnosis: Unsteadiness on feet (R26.81);Other abnormalities of gait and mobility (R26.89);Muscle weakness (generalized) (M62.81)     Time: 1415-1430 PT Time Calculation (min) (ACUTE ONLY): 15 min  Charges:  $Therapeutic Activity: 8-22 mins                     2:41 PM, 06/23/22 Lonell Grandchild, MPT Physical Therapist with [facility] 336 [phone] office [phone] mobile phone

## 2022-06-23 NOTE — Progress Notes (Signed)
[facility]  I have made multiple personal calls to [facility] to try to get this. Patient placed in an L tac. I have left multiple messages myself and was given numbers to a third-party, which ended up being incorrect as the third-party says they cannot approve an L tac.   Social Work is also working on getting the patient placed and getting this peer to peer completed. At this time, we're still trying to do  the peer to peer, but no one has given [place] a call.   Dr. Dyann Kief and myself are available to do the peer to peer  and every attempt has been made to make this possible.  Curlene Labrum MD

## 2022-06-23 NOTE — Progress Notes (Signed)
PHARMACY - TOTAL PARENTERAL NUTRITION CONSULT NOTE   Indication: gastric perforation  Patient Measurements: Height: '5\' 1"'$  (154.9 cm) Weight: 54.3 kg (119 lb 11.4 oz) IBW/kg (Calculated) : 47.8 TPN AdjBW (KG): 57.7 Body mass index is 22.62 kg/m.  Assessment:  Pharmacy consulted to dose TPN in patient with gastric perforation. Repeat CT on  06/19/22 shows persistent Leak and Peritonitis. Plan for repeat CT in 2 weeks 07/03/22  Glucose / Insulin:    111-135- 0 units Electrolytes: K 3.6 Corrected calcium 12.4 Renal: Scr 1.24 Hepatic: albumin 1.6 Tbili 4.4> 3.7> 2.9 TG 114  GI Surgeries / Procedures:  Repeat CT on  06/19/22 shows persistent Leak and Peritonitis. Plan for repeat CT in 2 weeks 07/03/22  Exploratory laparotomy, gastrorrhaphy  Central access: PICC double lumen 7/10 TPN start date: 7/10  Nutritional Goals: Goal TPN rate is 70 mL/hr (provides 77 g of protein and 1444 kcals per day)  RD Assessment: Kcal:  1400-1600 Protein:  75-80 gr  Fluid:  >/= 1600 ml daily  Current Nutrition:  NPO  Plan:  Continue TPN at 70 mL/hr at 1800 Electrolytes in TPN: Na 47mq/L, K 50 mEq/L, Ca 0 mEq/L, Mg 5 mEq/L, and Phos 10 mmol/L. Cl:Ac 1:1 Add 14 units inuslin in TPN - consider less CBG checks if patient tolerating w/out insulin needs  standard MVI and trace elements to TPN Continue Sensitive q8h SSI and adjust as needed  Monitor TPN labs on Mon/Thurs,   LIsac Sarna BS Pharm D, BCPS Clinical Pharmacist 06/23/2022 8:10 AM

## 2022-06-23 NOTE — Care Management Important Message (Signed)
Important Message  Patient Details  Name: Carolyn Erickson MRN: [id number] Date of Birth: 10-09-1946   Medicare Important Message Given:  Yes  Reviewed Medicare IM with spouse via room phone.  Copy of Medicare IM mailed to home address on file.    Dannette Barbara 06/23/2022, 10:04 AM

## 2022-06-23 NOTE — Discharge Summary (Signed)
Physician Discharge Summary   Patient: Carolyn Erickson MRN: [id number] DOB: 1946-02-12  Admit date:     06/03/2022  Discharge date: 06/23/22  Discharge Physician: Barton Dubois   PCP: Dettinger, Fransisca Kaufmann, MD   Recommendations at discharge:  Maintain adequate hydration TPN per current regimen (please reach out to pharmacy service for details/orders regimen) Follow detailed instructions by general surgery (Dr. Constance Haw) to further follow treatment and decision regarding gastric perforation with ongoing leakage concern. Repeat complete metabolic panel, magnesium, phosphorus and triglycerides every Monday and Thursday to further decide/adjust TPN orders. Repeat CBC in 5 days to follow hemoglobin trend/stability.  Discharge Diagnoses: Principal Problem:   Septic shock - RESOLVED Active Problems:   Gastric perforation (HCC)   Fungemia   Acute renal failure superimposed on stage 3a chronic kidney disease (HCC)   Mixed hyperlipidemia   Essential hypertension   GERD (gastroesophageal reflux disease)   Major neurocognitive disorder (HCC)   Acute respiratory failure with hypoxia (HCC)   Hypomagnesemia   Volume overload   Hyperbilirubinemia   Leukocytosis   Gastrocutaneous fistula    Hospital Course: 76 year old female with history of major neurocognitive disorder, hypertension, hyperlipidemia, depression, left breast cancer presenting with generalized weakness and abdominal pain.  Apparently, the patient has been having abdominal pain since April 2023.  The patient had been started on pantoprazole at that time.  There was no improvement.  She followed up with her PCP on 06/02/2022.  Referral was given to see gastroenterology.  Abdominal pain had worsened over the past 2 days prior to admission with worsening generalized weakness.  There is no fevers, chills, chest pain, shortness breath, vomiting, diarrhea.  There is no hematochezia or melena noted.  In the emergency department, the patient was  hypotensive with a blood pressure of 71/54.  She was somnolent.  CT of the abdomen and pelvis was performed and showed changes consistent with a perforated gastric ulcer.  Dr. Constance Haw was consulted, and the patient was taken to the OR for exploratory laparotomy, gastrorrhaphy, omental patch.  A left IJ central line was also placed.  TRH is consulted for medical management  In the ED, the patient had low-grade fever 99.5 F.  She was tachycardic and hypotensive with blood pressure 68/53.  WBC 4.1, hemoglobin 14.6, platelets 308,000.  BMP showed sodium 137, potassium 4.1, bicarbonate 22, serum creatinine 2.14.  Postoperatively, the patient was started on Zosyn and fluconazole.  She remained on the ventilator after surgical intervention.  Preoperative chest x-ray showed bibasilar atelectasis.  There was free air under the diaphragm.  Assessment and Plan: * Septic shock - RESOLVED -Presented with hypotension, tachycardia, and lactic acid that  peaked at 6.8 -Pt successfully weaned off norepinephrine infusion and with stable vital signs at time of discharge. -Sepsis process secondary to gastric perforation with peritonitis -06/03/22 blood cultures--C. glabrata -UA 11-20 WBC>>urine culture unrevealing -Continue IV antibiotics per ID (currently receiving Unasyn for 5 more days at time of discharge). -Patient has completed treatment for fungemia.  Gastric perforation ([place]) -Suspect this may be due to chronic NSAID use -Continue PPI twice daily (currently transition to oral route). -06/04/2022--ex laparotomy with omental patch -Postoperative care per general surgery service (Dr. Constance Haw) -UGI 7/6 - no leak seen.  7/9 - CT c/w leak present - PICC line placed 7/10 and TPN started 7/10 - surgery planning to repeat CT with oral contrast again on 07/03/22 -Continue IV Unasyn for complaining of peritonitis. -Still with ongoing leakage.  Fungemia -06/03/22 blood cultures--Candida glabrata -  fluconazole discontinued  per ID consult -patient completed echinocandin per ID (14 days after blood cultures without growth; last antifungal therapy given on 06/20/2022). -Patient has remained afebrile.  Acute renal failure superimposed on stage 3a chronic kidney disease (HCC) -Serum creatinine peaked at 2.28 but trended down since then. -Secondary to hemodynamic changes/sepsis and hypovolemia -Currently stable per GFR essentially back to baseline. -Continue minimizing the use of nephrotoxic agents, avoid hypotension, adjust medication dosages to appropriate renal function and maintain adequate hydration. -Creatinine at discharge 1.2 -Continue to follow renal function trend/stability.   Gastrocutaneous fistula - Continue to follow-up on further recommendations by general surgery. -JP drain in place.  Leukocytosis -WBC trending down appropriately; peak in the 30,000 range and most recently check down into the 16000.  -- appreciate ID and surgery recommendations, continue current antibiotic therapy as instructed. 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  Hyperbilirubinemia -secondary to persistent leak. -Continue to monitor complete metabolic panel intermittently.  -Last bilirubin level 2.9.  Volume overload - treated with IV lasix 40 mg x 2 on 7/7 and resolved since then. -Continue to be judicious about volume status and fluid resuscitation if needed.  Hypomagnesemia Repleted -Continue to closely follow drain and further repleted/supplement through TPN.  Acute respiratory failure with hypoxia (HCC) After surgical intervention patient requires to maintain ventilatory support (PRVC, RR 20, Vt 380, FiO2 0.4) -Successfully extubated on 7/3 and currently not requiring any oxygen supplementation. - continue supportive care.  Major neurocognitive disorder [facility]) -Patient had MoCa 17/30 on 03/14/22 -will continue home dose donezepil, venlafaxine and bupropion   GERD  (gastroesophageal reflux disease) - Continue PPI twice a day.  Essential hypertension -Resumed amlodipine 2.5 mg daily -Follow-up vital signs.  Mixed hyperlipidemia - Continue Lipitor.   Consultants: General surgery, PCCM and infectious disease. Procedures performed: See below for x-ray reports.  Status post surgical intervention to repair gastric perforation. Disposition: Patient will be discharged to select LTAC facility. Diet recommendation: N.p.o. status except for sips with essential medications.  Nutrition and hydration provided through TPN.  DISCHARGE MEDICATION: Allergies as of 06/23/2022   No Known Allergies      Medication List     STOP taking these medications    cyclobenzaprine 10 MG tablet Commonly known as: FLEXERIL   meloxicam 7.5 MG tablet Commonly known as: MOBIC       TAKE these medications    amLODipine 2.5 MG tablet Commonly known as: NORVASC TAKE 1 TABLET EVERY DAY   Ampicillin-Sulbactam 3 g in sodium chloride 0.9 % 100 mL Inject 3 g into the vein every 12 (twelve) hours for 5 days. Start taking on: June 24, 2022   atorvastatin 20 MG tablet Commonly known as: LIPITOR Take 1 tablet (20 mg total) by mouth daily.   buPROPion 150 MG 12 hr tablet Commonly known as: WELLBUTRIN SR Take 1 tablet (150 mg total) by mouth 2 (two) times daily.   cholecalciferol 25 MCG (1000 UNIT) tablet Commonly known as: VITAMIN D3 Take 1,000 Units by mouth daily.   donepezil 10 MG tablet Commonly known as: ARICEPT Take one tablet 10 mg daily   morphine (PF) 2 MG/ML injection Inject 0.5 mLs (1 mg total) into the vein every 4 (four) hours as needed (severe pain.).   ondansetron 4 MG/2ML Soln injection Commonly known as: ZOFRAN Inject 2 mLs (4 mg total) into the vein every 6 (six) hours as needed for nausea.   pantoprazole 40 MG tablet Commonly known as: PROTONIX Take 1 tablet (  40 mg total) by mouth 2 (two) times daily before a meal.   venlafaxine 37.5  MG tablet Commonly known as: EFFEXOR Take 1 tablet (37.5 mg total) by mouth 2 (two) times daily.               Discharge Care Instructions  (From admission, onward)           Start     Ordered   06/23/22 0000  Discharge wound care:       Comments: Pack lower midline wound with saline dampened gauze and cover with gauze and paper tape; daily wound care.  Repeat as needed for soiling.   06/23/22 1432            Discharge Exam: Filed Weights   06/19/22 1700 06/20/22 0500 06/21/22 0600  Weight: 57.7 kg 56.8 kg 54.3 kg   General exam: Alert, awake, oriented x 2 intermittently; no overnight events.  No nausea, no vomiting; patient is afebrile and no bloody bowel movements reported overnight. Respiratory system: Clear to auscultation. Respiratory effort normal.  No using accessory muscle.  Good saturation on room air. Cardiovascular system:RRR. No rubs or gallops; no JVD. Gastrointestinal system: Abdomen is nondistended, soft and nontender positive bowel sounds.  JP drain with light yellow fluid and no blood; wound is clean dry and intact. Central nervous system: No focal neurologic deficit. Extremities: No cyanosis or clubbing. Skin: No petechiae; right upper extremity PICC line in place. Psychiatry: Judgement and insight appear normal. Mood & affect appropriate.   Condition at discharge: Stable and improved.  The results of significant diagnostics from this hospitalization (including imaging, microbiology, ancillary and laboratory) are listed below for reference.   Imaging Studies: CT ABDOMEN PELVIS W CONTRAST  Result Date: 06/19/2022 CLINICAL DATA:  Peritonitis, gastric perforation, status post laparotomy and omental patch, contained leak evaluate for continued leak and abscess formation EXAM: CT ABDOMEN AND PELVIS WITH CONTRAST TECHNIQUE: Multidetector CT imaging of the abdomen and pelvis was performed using the standard protocol following bolus administration of  intravenous contrast. RADIATION DOSE REDUCTION: This exam was performed according to the departmental dose-optimization program which includes automated exposure control, adjustment of the mA and/or kV according to patient size and/or use of iterative reconstruction technique. CONTRAST:  83m OMNIPAQUE IOHEXOL 300 MG/ML SOLN, additional oral enteric contrast COMPARISON:  06/11/2022 FINDINGS: Lower chest: Moderate bilateral pleural effusions and associated atelectasis or consolidation, similar to prior examination. Cardiomegaly. Coronary artery calcifications. Hepatobiliary: No solid liver abnormality is seen. No gallstones, gallbladder wall thickening, or biliary dilatation. Pancreas: Unremarkable. No pancreatic ductal dilatation or surrounding inflammatory changes. Spleen: Normal in size without significant abnormality. Adrenals/Urinary Tract: Adrenal glands are unremarkable. Kidneys are normal, without renal calculi, solid lesion, or hydronephrosis. Bladder is unremarkable. Stomach/Bowel: Mucosal thickening of the gastric body and antrum. Persistent air and fluid collection in the ventral abdomen overlying the gastric antrum containing enteric contrast and communicating with the ventral gastric antrum, measuring 6.1 x 2.3 cm (series 2, image 37). This collection may further communicate to midline laparotomy incision, which contains air loculations (series 2, image 37). Appendix appears normal. No evidence of bowel wall thickening, distention, or inflammatory changes. Vascular/Lymphatic: Aortic atherosclerosis. No enlarged abdominal or pelvic lymph nodes. Reproductive: Status post hysterectomy. Other: Severe anasarca. Status post midline laparotomy, as above containing air loculations within the incision (series 2, image 35). Small volume perihepatic and perisplenic ascites, similar to prior examination, however now with clear, diffuse peritoneal thickening and hyperenhancement (series 2, image 23). Fluid  loculation in the central small bowel mesentery measuring 3.9 x 2.0 cm (series 2, image 56) well as in the low pelvis measuring 3.5 x 3.5 cm (series 2, image 65). Surgical drain about the ventral abdomen. Musculoskeletal: No acute or significant osseous findings. IMPRESSION: 1. Persistent air and fluid collection in the ventral abdomen overlying the gastric antrum containing enteric contrast and communicating with the ventral gastric antrum, measuring 6.1 x 2.3 cm. This collection may further communicate to midline laparotomy incision, which contains air loculations. 2. Small volume perihepatic and perisplenic ascites, similar to prior examination, however now with clear, diffuse peritoneal thickening and hyperenhancement. Additional fluid collections in the central small bowel mesentery and low pelvis. Findings are consistent with peritonitis, and the presence or absence of infection within these fluid collections is not established by CT. 3. Moderate bilateral pleural effusions and associated atelectasis or consolidation, similar to prior examination. 4. Severe anasarca. 5. Coronary artery disease. Aortic Atherosclerosis (ICD10-I70.0). Electronically Signed   By: Delanna Ahmadi M.D.   On: 06/19/2022 11:55   US Abdomen Limited RUQ (LIVER/GB)  Result Date: 06/12/2022 CLINICAL DATA:  Hyperbilirubinemia EXAM: ULTRASOUND ABDOMEN LIMITED RIGHT UPPER QUADRANT COMPARISON:  CT abdomen and pelvis 06/11/2022 FINDINGS: Gallbladder: Sludge within gallbladder. No definite shadowing calculi. Mild gallbladder wall thickening. No sonographic Murphy sign. Common bile duct: Diameter: 3 mm, normal Liver: Normal echogenicity without mass or nodularity. No intrahepatic biliary dilatation. Portal vein is patent on color Doppler imaging with normal direction of blood flow towards the liver. Other: Perihepatic ascites identified, containing septations/loculations. IMPRESSION: Sludge within gallbladder with mild gallbladder wall  thickening, a nonspecific finding in the setting of ascites. No biliary dilatation or focal hepatic abnormality. Electronically Signed   By: Lavonia Dana M.D.   On: 06/12/2022 14:40   [place] EKG SITE RITE  Result Date: 06/11/2022 If Site Rite image not attached, placement could not be confirmed due to current cardiac rhythm.  CT ABDOMEN PELVIS WO CONTRAST  Result Date: 06/11/2022 CLINICAL DATA:  EXPLORATORY LAPAROTOMY, omental patchImpression: Worsening leukocytosis. EXAM: CT ABDOMEN AND PELVIS WITHOUT CONTRAST TECHNIQUE: Multidetector CT imaging of the abdomen and pelvis was performed following the standard protocol without IV contrast. RADIATION DOSE REDUCTION: This exam was performed according to the departmental dose-optimization program which includes automated exposure control, adjustment of the mA and/or kV according to patient size and/or use of iterative reconstruction technique. COMPARISON:  06/03/2022. FINDINGS: Lower chest: Moderate pleural effusions. Dependent lower lobe and right middle lobe opacity, likely atelectasis. Additional ground-glass opacities are noted, most evident in the right middle lobe, suspicious for infection. Effusions are increased on the left and new on the right since the prior CT. Lung base opacities are mostly new. Hepatobiliary: No focal liver abnormality is seen. No gallstones, gallbladder wall thickening, or biliary dilatation. Pancreas: Unremarkable. No pancreatic ductal dilatation or surrounding inflammatory changes. Spleen: Normal in size without focal abnormality. Adrenals/Urinary Tract: No adrenal masses. No renal masses or hydronephrosis. Bladder is unremarkable. Stomach/Bowel: Stomach is mostly decompressed. There is an apparent defect along the anterior aspect of the mid to distal stomach through which contrast and air has collected. The ill-defined collection of contrast and air lies in the mid to upper abdomen near the midline, with an adjacent surgical drain, and  deep to the midline laparotomy incision. Poorly defined small bowel loops in the central abdomen. There is no colon or small bowel dilation to suggest obstruction. No colonic wall thickening or convincing inflammation. Vascular/Lymphatic: Aortic atherosclerosis. Prominent lymph nodes noted along  the gastrohepatic ligament. There are no enlarged lymph nodes. Reproductive: Status post hysterectomy. No adnexal masses. Other: Small amount of ascites. Generalized increased attenuation of the mesenteric/peritoneal fat consistent with edema. Diffuse subcutaneous soft tissue edema. Musculoskeletal: No acute finding. IMPRESSION: 1. There is no defined fluid collection to indicate an abscess. 2. There is extraluminal air and contrast in the anterior central to upper abdomen, that appears to be arising from a defect along the anterior gastric wall. The extraluminal contrast and air lies adjacent to the surgical drain. 3. Small amount of ascites is similar to the previous CT. 4. Mesenteric/peritoneal and diffuse subcutaneous edema. 5. Moderate pleural effusions with associated lung base atelectasis. Additional areas of ground-glass lung opacity are suspicious for multifocal pneumonia. Electronically Signed   By: Lajean Manes M.D.   On: 06/11/2022 12:56   DG CHEST PORT 1 VIEW  Result Date: 06/09/2022 CLINICAL DATA:  Weakness, wheezing EXAM: PORTABLE CHEST 1 VIEW COMPARISON:  06/05/2022 FINDINGS: Interval endotracheal and esophagogastric extubation. Small to moderate bilateral pleural effusions, increased compared to prior examination, with associated atelectasis or consolidation and diffuse bilateral interstitial pulmonary opacity. Cardiomegaly. IMPRESSION: 1. Small to moderate bilateral pleural effusions, increased compared to prior examination, with associated atelectasis or consolidation and diffuse bilateral interstitial pulmonary opacity. Findings are most consistent with worsened edema. 2. Interval endotracheal and  esophagogastric extubation. 3. Cardiomegaly. Electronically Signed   By: Delanna Ahmadi M.D.   On: 06/09/2022 12:25   DG UGI W SMALL BOWEL  Result Date: 06/08/2022 CLINICAL DATA:  Post repair of 1.5 cm diameter perforated anterior wall gastric antral ulcer EXAM: WATER SOLUBLE UPPER GI SERIES TECHNIQUE: Single-column upper GI series was performed using water soluble contrast. Radiation Exposure Index (as provided by the fluoroscopic device): 43.1 mGy Kerma CONTRAST:  100 cc Omnipaque 300 via NG tube COMPARISON:  CT abdomen and pelvis 06/03/2022 FLUOROSCOPY: Fluoroscopy Time:  2 minutes 36 seconds Radiation Exposure Index (if provided by the fluoroscopic device): 43.1 Number of Acquired Spot Images: Multiple fluoroscopic screen captures and cine series FINDINGS: Contrast administered via NG tube opacifies the gastric lumen. Patent pylorus with passage of contrast through normal appearing duodenal C loop into proximal jejunum with incidentally noted duodenal diverticulum at second portion. Irregularity of rugal folds with thickening and distortion at anterior wall of gastric antrum at site of perforation repair. The third portion of the duodenum is superimposed with the inferior wall of the gastric antrum, mildly limiting exam. A blush of contrast is seen inferior to the antrum, superimposed with the third portion of the duodenum throughout the study. This appears to move in concert with the duodenum with respiration. No contrast opacification of the JP drain. No gross evidence of contrast leak identified though it is difficult to completely exclude a small leak at the site of repair. IMPRESSION: Distortion and thickening of rugal folds at site of antral gastric ulcer repair. No gross evidence of leak at the site of ulcer repair as discussed above. Electronically Signed   By: Lavonia Dana M.D.   On: 06/08/2022 15:32   ECHOCARDIOGRAM COMPLETE  Result Date: 06/07/2022    ECHOCARDIOGRAM REPORT   Patient Name:   SHALAINA GUARDIOLA Date of Exam: 06/07/2022 Medical Rec #:  [id number]       Height:       61.0 in Accession #:    [id number]      Weight:       112.2 lb Date of Birth:  [birth year]  BSA:          1.477 m Patient Age:    37 years        BP:           116/46 mmHg Patient Gender: F               HR:           104 bpm. Exam Location:  [facility] Procedure: 2D Echo, Cardiac Doppler and Color Doppler Indications:    Bacteremia  History:        Patient has no prior history of Echocardiogram examinations.                 Signs/Symptoms:Bacteremia; Risk Factors:Hypertension and                 Dyslipidemia. Breast CA.  Sonographer:    Wenda Low Referring Phys: [id number] [place]  1. Abnormal septal motion . Left ventricular ejection fraction, by estimation, is 55 to 60%. The left ventricle has normal function. The left ventricle has no regional wall motion abnormalities. Left ventricular diastolic parameters were normal.  2. Right ventricular systolic function is normal. The right ventricular size is normal. There is mildly elevated pulmonary artery systolic pressure.  3. Moderate pleural effusion in the left lateral region.  4. The mitral valve is abnormal. Trivial mitral valve regurgitation. No evidence of mitral stenosis.  5. Thickening and calcification noted . The tricuspid valve is abnormal.  6. Some thickening in the LVOT near intervalvular fibrosa Given this and nodular calcification of PV suggest TEE if suspicion for SBE high. The aortic valve is tricuspid. There is moderate calcification of the aortic valve. There is moderate thickening of the aortic valve. Aortic valve regurgitation is mild. Aortic valve sclerosis/calcification is present, without any evidence of aortic stenosis.  7. Nodular calcification seen on PV. The pulmonic valve was abnormal.  8. The inferior vena cava is normal in size with greater than 50% respiratory variability, suggesting right atrial pressure of 3 mmHg. FINDINGS  Left  Ventricle: Abnormal septal motion. Left ventricular ejection fraction, by estimation, is 55 to 60%. The left ventricle has normal function. The left ventricle has no regional wall motion abnormalities. The left ventricular internal cavity size was normal in size. There is no left ventricular hypertrophy. Left ventricular diastolic parameters were normal. Right Ventricle: The right ventricular size is normal. No increase in right ventricular wall thickness. Right ventricular systolic function is normal. There is mildly elevated pulmonary artery systolic pressure. The tricuspid regurgitant velocity is 3.21  m/s, and with an assumed right atrial pressure of 3 mmHg, the estimated right ventricular systolic pressure is 32.2 mmHg. Left Atrium: Left atrial size was normal in size. Right Atrium: Right atrial size was normal in size. Pericardium: There is no evidence of pericardial effusion. Mitral Valve: The mitral valve is abnormal. There is mild thickening of the mitral valve leaflet(s). There is mild calcification of the mitral valve leaflet(s). Mild mitral annular calcification. Trivial mitral valve regurgitation. No evidence of mitral valve stenosis. MV peak gradient, 3.9 mmHg. The mean mitral valve gradient is 1.0 mmHg. Tricuspid Valve: Thickening and calcification noted. The tricuspid valve is abnormal. Tricuspid valve regurgitation is trivial. No evidence of tricuspid stenosis. Aortic Valve: Some thickening in the LVOT near intervalvular fibrosa Given this and nodular calcification of PV suggest TEE if suspicion for SBE high. The aortic valve is tricuspid. There is moderate calcification of the aortic valve. There is moderate  thickening of the aortic valve. Aortic valve regurgitation is mild. Aortic valve sclerosis/calcification is present, without any evidence of aortic stenosis. Aortic valve mean gradient measures 5.0 mmHg. Aortic valve peak gradient measures 10.3 mmHg. Aortic valve area, by VTI measures 2.02 cm.  Pulmonic Valve: Nodular calcification seen on PV. The pulmonic valve was abnormal. Pulmonic valve regurgitation is trivial. No evidence of pulmonic stenosis. Aorta: The aortic root is normal in size and structure. Venous: The inferior vena cava is normal in size with greater than 50% respiratory variability, suggesting right atrial pressure of 3 mmHg. IAS/Shunts: No atrial level shunt detected by color flow Doppler. Additional Comments: There is a moderate pleural effusion in the left lateral region.  LEFT VENTRICLE PLAX 2D LVIDd:         4.50 cm     Diastology LVIDs:         3.00 cm     LV e' medial:    7.29 cm/s LV PW:         0.80 cm     LV E/e' medial:  11.8 LV IVS:        0.80 cm     LV e' lateral:   9.79 cm/s LVOT diam:     1.80 cm     LV E/e' lateral: 8.8 LV SV:         55 LV SV Index:   37 LVOT Area:     2.54 cm  LV Volumes (MOD) LV vol d, MOD A2C: 43.7 ml LV vol d, MOD A4C: 52.7 ml LV vol s, MOD A2C: 17.3 ml LV vol s, MOD A4C: 23.8 ml LV SV MOD A2C:     26.4 ml LV SV MOD A4C:     52.7 ml LV SV MOD BP:      28.6 ml RIGHT VENTRICLE RV Basal diam:  3.10 cm RV Mid diam:    2.90 cm RV S prime:     14.50 cm/s TAPSE (M-mode): 3.0 cm LEFT ATRIUM             Index        RIGHT ATRIUM           Index LA diam:        3.60 cm 2.44 cm/m   RA Area:     11.50 cm LA Vol (A2C):   50.9 ml 34.45 ml/m  RA Volume:   21.80 ml  14.76 ml/m LA Vol (A4C):   43.7 ml 29.58 ml/m LA Biplane Vol: 49.8 ml 33.71 ml/m  AORTIC VALVE                     PULMONIC VALVE AV Area (Vmax):    1.92 cm      PV Vmax:       0.98 m/s AV Area (Vmean):   1.84 cm      PV Peak grad:  3.9 mmHg AV Area (VTI):     2.02 cm AV Vmax:           160.50 cm/s AV Vmean:          103.550 cm/s AV VTI:            0.274 m AV Peak Grad:      10.3 mmHg AV Mean Grad:      5.0 mmHg LVOT Vmax:         121.00 cm/s LVOT Vmean:        74.800 cm/s LVOT VTI:  0.218 m LVOT/AV VTI ratio: 0.79  AORTA Ao Root diam: 3.30 cm MITRAL VALVE               TRICUSPID VALVE MV  Area (PHT): 6.02 cm    TR Peak grad:   41.2 mmHg MV Area VTI:   2.25 cm    TR Vmax:        321.00 cm/s MV Peak grad:  3.9 mmHg MV Mean grad:  1.0 mmHg    SHUNTS MV Vmax:       0.99 m/s    Systemic VTI:  0.22 m MV Vmean:      48.8 cm/s   Systemic Diam: 1.80 cm MV Decel Time: 126 msec MV E velocity: 86.00 cm/s MV A velocity: 72.70 cm/s MV E/A ratio:  1.18 Jenkins Rouge MD Electronically signed by Jenkins Rouge MD Signature Date/Time: 06/07/2022/10:28:36 AM    Final    DG CHEST PORT 1 VIEW  Result Date: 06/05/2022 CLINICAL DATA:  76 year old female with respiratory failure. Intubated. EXAM: PORTABLE CHEST 1 VIEW COMPARISON:  Portable chest 06/04/2022 and earlier. FINDINGS: Portable AP semi upright view at 0527 hours. Endotracheal tube tip in good position between the clavicles and carina. Stable left IJ central line and visible enteric tube. Increased left lung base and retrocardiac opacity now mostly obscuring the left hemidiaphragm. Slightly lower lung volumes overall. Mediastinal contours remain normal. No pneumothorax, pulmonary edema, or definite effusion. Negative right lung. Partially visible midline abdominal skin staples. Paucity of bowel gas in the upper abdomen. Stable left chest and axillary surgical clips. IMPRESSION: 1. Stable lines and tubes. 2. Increased left lower lobe collapse or consolidation since yesterday. Electronically Signed   By: Genevie Ann M.D.   On: 06/05/2022 08:26   DG Chest Port 1 View  Result Date: 06/04/2022 CLINICAL DATA:  Endotracheal tube, nasogastric tube and central line placement. EXAM: PORTABLE CHEST 1 VIEW COMPARISON:  June 03, 2022 FINDINGS: Since the prior study there has been interval placement of an endotracheal tube. Its distal tip is seen approximately 3.8 cm from the carina. Interval nasogastric tube placement is also seen. Its distal and extends into the gastric lumen. There is a left internal jugular venous catheter with its distal tip overlying the distal superior vena  cava. This is approximately 6 mm proximal to the junction of the superior vena cava and right atrium. The heart size and mediastinal contours are within normal limits. Mild to moderate severity atelectasis and/or infiltrate is seen within the left lung base. This represents a new finding when compared to the prior study. There is no evidence of a pleural effusion or pneumothorax. Radiopaque surgical clips are seen along the left axilla. The air seen below the right hemidiaphragm on the prior study is no longer visualized. The visualized skeletal structures are unremarkable. IMPRESSION: 1. Interval endotracheal tube, nasogastric tube and left internal jugular venous catheter placement positioning, as described above. 2. Mild to moderate severity left basilar atelectasis and/or infiltrate. Electronically Signed   By: Virgina Norfolk M.D.   On: 06/04/2022 02:47   CT Abdomen Pelvis Wo Contrast  Result Date: 06/03/2022 CLINICAL DATA:  Severe weakness for 2 days with acute abdominal pain, initial encounter EXAM: CT ABDOMEN AND PELVIS WITHOUT CONTRAST TECHNIQUE: Multidetector CT imaging of the abdomen and pelvis was performed following the standard protocol without IV contrast. RADIATION DOSE REDUCTION: This exam was performed according to the departmental dose-optimization program which includes automated exposure control, adjustment of the mA and/or kV according to  patient size and/or use of iterative reconstruction technique. COMPARISON:  None Available. FINDINGS: Lower chest: Basilar atelectatic changes are noted. Hepatobiliary: Liver is within normal limits. Gallbladder demonstrates a normal appearance. Pancreas: Unremarkable. No pancreatic ductal dilatation or surrounding inflammatory changes. Spleen: Normal in size without focal abnormality. Adrenals/Urinary Tract: Adrenal glands are within normal limits. Kidneys demonstrate tiny nonobstructing stones on right in the lower pole. No ureteral obstruction is seen.  The bladder is decompressed. Stomach/Bowel: Considerable fecal material is noted within the rectal vault which may represent a focal impaction. Diverticular change of the colon is noted as well as some generalized wall thickening throughout the colon. The appendix is within normal limits. Small bowel is within normal limits. Stomach demonstrates a defect in the anterior gastric wall consistent with a perforated ulcer. There is a considerable amount of air and fluid throughout the abdomen with apparent direct communication with the gastric best seen on image number 36 of series 2 and image number 78 of series 6. Vascular/Lymphatic: Aortic atherosclerosis. No enlarged abdominal or pelvic lymph nodes. Reproductive: Status post hysterectomy. No adnexal masses. Other: Free air and free fluid similar to that described above throughout the abdomen. The bowel demonstrates some reactive wall thickening related to the fluid and air. Musculoskeletal: Degenerative changes of the lumbar spine are seen. No other bony abnormality is noted. IMPRESSION: Changes consistent with a perforated gastric ulcer anteriorly with considerable spillage of fluid and air into the abdominal cavity. Some reactive wall thickening is noted within the bowel loops secondary to these changes. Critical Value/emergent results were called by telephone at the time of interpretation on 06/03/2022 at 10:43 pm to Dr. Aletta Edouard , who verbally acknowledged these results. Electronically Signed   By: Inez Catalina M.D.   On: 06/03/2022 22:45   DG Chest Port 1 View  Result Date: 06/03/2022 CLINICAL DATA:  Weakness. EXAM: PORTABLE CHEST 1 VIEW COMPARISON:  None Available. FINDINGS: Multiple overlying radiopaque cardiac lead wires are seen. The heart size and mediastinal contours are within normal limits. Mild, diffuse, chronic appearing increased lung markings are seen. There is no evidence of an acute infiltrate, pleural effusion or pneumothorax. Radiopaque  surgical clips are seen along the lateral aspect of the left chest wall. A crescentic area of air is seen just below the right hemidiaphragm. Degenerative changes seen throughout the thoracic spine. IMPRESSION: 1. Chronic appearing increased lung markings without evidence of acute or active cardiopulmonary disease. 2. Air seen just below the right hemidiaphragm which may represent an air filled loop of colon. Further evaluation with abdomen pelvis CT is recommended, as the presence of intra-abdominal free air cannot be excluded. Electronically Signed   By: Virgina Norfolk M.D.   On: 06/03/2022 21:22    Microbiology: Results for orders placed or performed during the hospital encounter of 06/03/22  Cath Tip Culture     Status: None   Collection Time: 06/03/22  8:07 PM   Specimen: Catheter Tip  Result Value Ref Range Status   Specimen Description CATH TIP  Final   Special Requests NONE  Final   Culture   Final    NO GROWTH 3 DAYS Performed at [facility], [place] [address], [place], [place] [address]    Report Status 06/09/2022 FINAL  Final  Culture, blood (Routine x 2)     Status: Abnormal   Collection Time: 06/03/22  8:45 PM   Specimen: BLOOD  Result Value Ref Range Status   Specimen Description   Final  BLOOD LEFT ANTECUBITAL Performed at [facility], [place] [address], [place], [place] [address]    Special Requests   Final    BOTTLES DRAWN AEROBIC AND ANAEROBIC Blood Culture adequate volume Performed at [facility], [address] [place], [place] [address]    Culture  Setup Time   Final    YEAST BOTH Gram Stain Report Called to,Read Back By and Verified With: JACOBS, U'@0745'$  BY MATTHEWS, B 7.4.2023 CRITICAL RESULT CALLED TO, READ BACK BY AND VERIFIED WITH: PHARMD S.HURTZ AT 1130 ON 06/06/2022 BY T.SAAD.    Culture (A)  Final    CANDIDA GLABRATA Sent to [facility] for further susceptibility testing. Performed at [facility], [place] [address], [place], [place]  [address]    Report Status 06/09/2022 FINAL  Final  Blood Culture ID Panel (Reflexed)     Status: Abnormal   Collection Time: 06/03/22  8:45 PM  Result Value Ref Range Status   Enterococcus faecalis NOT DETECTED NOT DETECTED Final   Enterococcus Faecium NOT DETECTED NOT DETECTED Final   Listeria monocytogenes NOT DETECTED NOT DETECTED Final   Staphylococcus species NOT DETECTED NOT DETECTED Final   Staphylococcus aureus (BCID) NOT DETECTED NOT DETECTED Final   Staphylococcus epidermidis NOT DETECTED NOT DETECTED Final   Staphylococcus lugdunensis NOT DETECTED NOT DETECTED Final   Streptococcus species NOT DETECTED NOT DETECTED Final   Streptococcus agalactiae NOT DETECTED NOT DETECTED Final   Streptococcus pneumoniae NOT DETECTED NOT DETECTED Final   Streptococcus pyogenes NOT DETECTED NOT DETECTED Final   A.calcoaceticus-baumannii NOT DETECTED NOT DETECTED Final   Bacteroides fragilis NOT DETECTED NOT DETECTED Final   Enterobacterales NOT DETECTED NOT DETECTED Final   Enterobacter cloacae complex NOT DETECTED NOT DETECTED Final   Escherichia coli NOT DETECTED NOT DETECTED Final   Klebsiella aerogenes NOT DETECTED NOT DETECTED Final   Klebsiella oxytoca NOT DETECTED NOT DETECTED Final   Klebsiella pneumoniae NOT DETECTED NOT DETECTED Final   Proteus species NOT DETECTED NOT DETECTED Final   Salmonella species NOT DETECTED NOT DETECTED Final   Serratia marcescens NOT DETECTED NOT DETECTED Final   Haemophilus influenzae NOT DETECTED NOT DETECTED Final   Neisseria meningitidis NOT DETECTED NOT DETECTED Final   Pseudomonas aeruginosa NOT DETECTED NOT DETECTED Final   Stenotrophomonas maltophilia NOT DETECTED NOT DETECTED Final   Candida albicans NOT DETECTED NOT DETECTED Final   Candida auris NOT DETECTED NOT DETECTED Final   Candida glabrata DETECTED (A) NOT DETECTED Final    Comment: CRITICAL RESULT CALLED TO, READ BACK BY AND VERIFIED WITH: PHARMD S.HURTZ AT 1130 ON 06/06/2022 BY  T.SAAD.    Candida krusei NOT DETECTED NOT DETECTED Final   Candida parapsilosis NOT DETECTED NOT DETECTED Final   Candida tropicalis NOT DETECTED NOT DETECTED Final   Cryptococcus neoformans/gattii NOT DETECTED NOT DETECTED Final    Comment: Performed at [facility], [place] [address], [place], [place] [address]  Antifungal AST 9 Drug Panel     Status: None   Collection Time: 06/03/22  8:45 PM  Result Value Ref Range Status   Organism ID, Yeast Candida glabrata  Corrected    Comment: (NOTE) Identification performed by account, not confirmed by this laboratory. CORRECTED ON 07/09 AT 1535: PREVIOUSLY REPORTED AS Preliminary report    Amphotericin B MIC 1.0 ug/mL  Final    Comment: (NOTE) Breakpoints have been established for only some organism-drug combinations as indicated. This test was developed and its performance characteristics determined by [facility]. It has  not been cleared or approved by the Food and Drug Administration.    Anidulafungin MIC Comment  Final    Comment: (NOTE) 0.12 ug/mL Susceptible Breakpoints have been established for only some organism-drug combinations as indicated. This test was developed and its performance characteristics determined by [facility]. It has not been cleared or approved by the Food and Drug Administration.    Caspofungin MIC Comment  Final    Comment: (NOTE) 0.25 ug/mL Intermediate Breakpoints have been established for only some organism-drug combinations as indicated. This test was developed and its performance characteristics determined by [facility]. It has not been cleared or approved by the Food and Drug Administration.    Micafungin MIC Comment  Final    Comment: (NOTE) 0.016 ug/mL Susceptible Breakpoints have been established for only some organism-drug combinations as indicated. This test was developed and its performance characteristics determined by [facility]. It has not been cleared or approved by the Food and Drug  Administration.    Posaconazole MIC 2.0 ug/mL  Final    Comment: (NOTE) Breakpoints have been established for only some organism-drug combinations as indicated. This test was developed and its performance characteristics determined by [facility]. It has not been cleared or approved by the Food and Drug Administration.    Fluconazole Islt MIC 16.0 ug/mL  Final    Comment: (NOTE) Susceptible Dose Dependent Breakpoints have been established for only some organism-drug combinations as indicated. This test was developed and its performance characteristics determined by [facility]. It has not been cleared or approved by the Food and Drug Administration.    Flucytosine MIC 0.06 ug/mL or less  Final    Comment: (NOTE) Breakpoints have been established for only some organism-drug combinations as indicated. This test was developed and its performance characteristics determined by [facility]. It has not been cleared or approved by the Food and Drug Administration.    Itraconazole MIC 1.0 ug/mL  Final    Comment: (NOTE) Breakpoints have been established for only some organism-drug combinations as indicated. This test was developed and its performance characteristics determined by [facility]. It has not been cleared or approved by the Food and Drug Administration.    Voriconazole MIC 0.5 ug/mL  Final    Comment: (NOTE) Breakpoints have been established for only some organism-drug combinations as indicated. This test was developed and its performance characteristics determined by [facility]. It has not been cleared or approved by the Food and Drug Administration. Performed At: [facility] [place], [place] [id number] Rush Farmer MD KG:[id number]    Source [phone] [facility] Ma [place]  Final    Comment: Performed at [facility], [place] [address], [place], [place] [address]  Culture, blood (Routine x 2)     Status: Abnormal   Collection Time: 06/03/22  9:32 PM    Specimen: BLOOD  Result Value Ref Range Status   Specimen Description   Final    BLOOD LEFT ANTECUBITAL Performed at [facility], [place] [address], [place], [place] [address]    Special Requests   Final    BOTTLES DRAWN AEROBIC AND ANAEROBIC Blood Culture adequate volume Performed at [facility], [address] [place], [place] [address]    Culture  Setup Time   Final    AEROBIC BOTTLE ONLY YEAST Gram Stain Report Called to,Read Back By and Verified With: UTE JACOBS @ 1200 ON 06/06/22 C [place] CRITICAL VALUE NOTED.  VALUE IS CONSISTENT WITH PREVIOUSLY REPORTED AND CALLED VALUE.    Culture (  A)  Final    CANDIDA GLABRATA SEE SEPARATE REPORT Performed at [facility], [place] [address], [place], [place] [address]    Report Status 06/11/2022 FINAL  Final  MRSA Next Gen by PCR, Nasal     Status: None   Collection Time: 06/04/22  2:18 AM   Specimen: Nasal Mucosa; Nasal Swab  Result Value Ref Range Status   MRSA by PCR Next Gen NOT DETECTED NOT DETECTED Final    Comment: (NOTE) The GeneXpert MRSA Assay (FDA approved for NASAL specimens only), is one component of a comprehensive MRSA colonization surveillance program. It is not intended to diagnose MRSA infection nor to guide or monitor treatment for MRSA infections. Test performance is not FDA approved in patients less than 71 years old. Performed at [facility], [address], [place], [place] [address]   Urine Culture     Status: Abnormal   Collection Time: 06/04/22  2:28 AM   Specimen: PATH Cytology Urine  Result Value Ref Range Status   Specimen Description   Final    URINE, CLEAN CATCH Performed at [facility], [address] [place], [place] [address]    Special Requests   Final    NONE Performed at [facility], [address] [place], [place] [address]    Culture (A)  Final    <10,000 COLONIES/mL INSIGNIFICANT GROWTH Performed at [place] [address], [place], [place] [address]    Report  Status 06/05/2022 FINAL  Final  Aerobic/Anaerobic Culture w Gram Stain (surgical/deep wound)     Status: None   Collection Time: 06/04/22  3:03 AM   Specimen: PATH Cytology FNA; Body Fluid  Result Value Ref Range Status   Specimen Description   Final    ANAL Performed at [facility], [address], [place], [place] [address]    Special Requests   Final    NONE Performed at [facility], [address] [place], [place] [address]    Gram Stain   Final    MODERATE WBC PRESENT,BOTH PMN AND MONONUCLEAR FEW GRAM POSITIVE COCCI IN PAIRS MODERATE GRAM POSITIVE RODS    Culture   Final    FEW CANDIDA ALBICANS NO ANAEROBES ISOLATED Performed at [facility], [place] [address], [place], [place] [address]    Report Status 06/09/2022 FINAL  Final  Culture, blood (Routine X 2) w Reflex to ID Panel     Status: None   Collection Time: 06/06/22  9:32 AM   Specimen: BLOOD LEFT WRIST  Result Value Ref Range Status   Specimen Description BLOOD LEFT WRIST  Final   Special Requests   Final    BOTTLES DRAWN AEROBIC ONLY Blood Culture results may not be optimal due to an inadequate volume of blood received in culture bottles   Culture   Final    NO GROWTH 5 DAYS Performed at [facility], [address] [place],  [address]    Report Status 06/11/2022 FINAL  Final  Culture, blood (Routine X 2) w Reflex to ID Panel     Status: None   Collection Time: 06/06/22  9:33 AM   Specimen: Left Antecubital; Blood  Result Value Ref Range Status   Specimen Description LEFT ANTECUBITAL  Final   Special Requests   Final    BOTTLES DRAWN AEROBIC AND ANAEROBIC Blood Culture adequate volume   Culture   Final    NO GROWTH 5 DAYS Performed at [facility], [place]  [address] [place], [place] [address]    Report Status 06/11/2022 FINAL  Final    Labs: CBC: Recent Labs  Lab 06/19/22 0606 06/19/22 1613 06/19/22 1848 06/19/22 2300 06/20/22 0409 06/21/22 0407 06/22/22 0605  WBC 19.0*  --  19.0*  --   18.3* 16.0* 17.8*  NEUTROABS  --   --   --   --  13.8*  --  13.0*  HGB 11.4*   < > 10.0* 9.4* 9.2* 8.3* 9.0*  HCT 37.3   < > 31.1* 28.6* 28.6* 25.1* 28.3*  MCV 95.6  --  94.0  --  92.6 93.0 95.0  PLT 498*  --  562*  --  465* 419* 427*   < > = values in this interval not displayed.   Basic Metabolic Panel: Recent Labs  Lab 06/17/22 0546 06/18/22 0617 06/19/22 0606 06/20/22 0409 06/22/22 0605  NA 145 144 143 142 142  K 4.8 4.0 4.6 4.1 3.6  CL 111 111 111 111 110  CO2 '29 27 25 26 27  '$ GLUCOSE 146* 98 130* 133* 112*  BUN 40* 39* 40* 42* 41*  CREATININE 1.23* 1.21* 1.18* 1.36* 1.24*  CALCIUM 10.7* 10.4* 10.4* 9.9 10.5*  MG  --  1.9 2.2  --  2.0  PHOS  --  3.7 4.0  --  3.9   Liver Function Tests: Recent Labs  Lab 06/17/22 0546 06/19/22 0606 06/20/22 0409 06/22/22 0605  AST 54* 58* 41 53*  ALT 41 46* 37 46*  ALKPHOS 181* 203* 139* 190*  BILITOT 4.4* 3.7* 2.7* 2.9*  PROT 6.2* 6.3* 5.6* 6.2*  ALBUMIN 1.7* 1.7* <1.5* 1.6*   CBG: Recent Labs  Lab 06/22/22 0009 06/22/22 0713 06/22/22 1554 06/23/22 0012 06/23/22 0740  GLUCAP 116* 111* 113* 132* 135*    Discharge time spent: greater than 30 minutes.  Signed: Barton Dubois, MD [facility] 06/23/2022

## 2022-06-23 NOTE — Progress Notes (Signed)
[facility]  Reviewed chart and looked at I/Os. Tech overnight recorded urine in the JP drain location. I fixed this as the JP drain would only drain 100 at a time and not 500. This is not even possible.  1000 L output of urine now documented instead of the erroneous 1000L from the JP.  JP has minimal yellow purulent fluid in it. < 10cc  Patient resting. Awaiting approval for the peer to peer for LTAC approval from insurance. We have given them our information but no calls back from them at this time.  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   Would do TPN and NPO for at least another month after 7/31 before doing another scan on 8/28 to decide if this will seal.   Curlene Labrum, MD [facility] [address] [place], [place] [address] [phone] (office)

## 2022-06-23 NOTE — TOC Transition Note (Addendum)
Transition of Care [facility]) - CM/SW Discharge Note   Patient Details  Name: Carolyn Erickson MRN: [id number] Date of Birth: [birth year]  [facility] CM/SW Contact:  Iona Beard, [place] Phone Number: 06/23/2022, 2:17 PM   Clinical Narrative:    CSW updated that Peer to Peer has been completed and pt has been approved for [facility]. CSW updated MD that facility is ready. CSW provided RN with information to set up [place]. TOC signing off.   CSW updated that [facility] can no longer accept pt this evening. They will be able to accept pt anytime after 7am in the morning. CSW to updated MD and RN of this.   INFORMATION for AM RN: The accepting physician is Dr. Rona Ravens. Pt will go to room 16 and the number for report is [phone] bu it cannot be called until 30 minutes prior to transport is set to arrive. Please call Jaquelyn Bitter with [facility] so he can ensure her room is ready [phone].  Final next level of care: Long Term Acute Care (LTAC) Barriers to Discharge: Barriers Resolved   Patient Goals and CMS Choice Patient states their goals for this hospitalization and ongoing recovery are:: Go to [facility] CMS Medicare.gov Compare Post Acute Care list provided to:: Patient Choice offered to / list presented to : Patient  Discharge Placement                Patient to be transferred to facility by: [facility]   Patient and family notified of of transfer: 06/23/22  Discharge Plan and Services In-house Referral: Clinical Social Work   Post Acute Care Choice: [place]                               Social Determinants of Health (SDOH) Interventions     Readmission Risk Interventions     No data to display

## 2022-06-23 NOTE — Assessment & Plan Note (Signed)
-   Continue to follow-up on further recommendations by general surgery. -JP drain in place.

## 2022-06-24 ENCOUNTER — Institutional Professional Consult (permissible substitution): Admit: 2022-06-24 | Discharge: 2022-08-04 | Disposition: E | Payer: Self-pay | Source: Other Acute Inpatient Hospital

## 2022-06-24 DIAGNOSIS — E440 Moderate protein-calorie malnutrition: Secondary | ICD-10-CM | POA: Diagnosis not present

## 2022-06-24 DIAGNOSIS — L03319 Cellulitis of trunk, unspecified: Secondary | ICD-10-CM | POA: Diagnosis not present

## 2022-06-24 DIAGNOSIS — I10 Essential (primary) hypertension: Secondary | ICD-10-CM | POA: Diagnosis not present

## 2022-06-24 DIAGNOSIS — K6389 Other specified diseases of intestine: Secondary | ICD-10-CM | POA: Diagnosis not present

## 2022-06-24 DIAGNOSIS — A4102 Sepsis due to Methicillin resistant Staphylococcus aureus: Secondary | ICD-10-CM | POA: Diagnosis not present

## 2022-06-24 DIAGNOSIS — Z452 Encounter for adjustment and management of vascular access device: Secondary | ICD-10-CM | POA: Diagnosis not present

## 2022-06-24 DIAGNOSIS — E46 Unspecified protein-calorie malnutrition: Secondary | ICD-10-CM | POA: Diagnosis not present

## 2022-06-24 DIAGNOSIS — R7881 Bacteremia: Secondary | ICD-10-CM | POA: Diagnosis not present

## 2022-06-24 DIAGNOSIS — I340 Nonrheumatic mitral (valve) insufficiency: Secondary | ICD-10-CM | POA: Diagnosis not present

## 2022-06-24 DIAGNOSIS — Z8719 Personal history of other diseases of the digestive system: Secondary | ICD-10-CM | POA: Diagnosis not present

## 2022-06-24 DIAGNOSIS — N19 Unspecified kidney failure: Secondary | ICD-10-CM | POA: Diagnosis not present

## 2022-06-24 DIAGNOSIS — K8689 Other specified diseases of pancreas: Secondary | ICD-10-CM | POA: Diagnosis not present

## 2022-06-24 DIAGNOSIS — K659 Peritonitis, unspecified: Secondary | ICD-10-CM | POA: Diagnosis not present

## 2022-06-24 DIAGNOSIS — N281 Cyst of kidney, acquired: Secondary | ICD-10-CM | POA: Diagnosis not present

## 2022-06-24 DIAGNOSIS — D649 Anemia, unspecified: Secondary | ICD-10-CM | POA: Diagnosis not present

## 2022-06-24 DIAGNOSIS — F03918 Unspecified dementia, unspecified severity, with other behavioral disturbance: Secondary | ICD-10-CM | POA: Diagnosis not present

## 2022-06-24 DIAGNOSIS — R6521 Severe sepsis with septic shock: Secondary | ICD-10-CM | POA: Diagnosis not present

## 2022-06-24 DIAGNOSIS — K316 Fistula of stomach and duodenum: Secondary | ICD-10-CM | POA: Diagnosis not present

## 2022-06-24 DIAGNOSIS — K651 Peritoneal abscess: Secondary | ICD-10-CM | POA: Diagnosis not present

## 2022-06-24 DIAGNOSIS — J189 Pneumonia, unspecified organism: Secondary | ICD-10-CM | POA: Diagnosis not present

## 2022-06-24 DIAGNOSIS — Z853 Personal history of malignant neoplasm of breast: Secondary | ICD-10-CM | POA: Diagnosis not present

## 2022-06-24 DIAGNOSIS — D72829 Elevated white blood cell count, unspecified: Secondary | ICD-10-CM | POA: Diagnosis not present

## 2022-06-24 DIAGNOSIS — F0390 Unspecified dementia without behavioral disturbance: Secondary | ICD-10-CM | POA: Diagnosis not present

## 2022-06-24 DIAGNOSIS — A419 Sepsis, unspecified organism: Secondary | ICD-10-CM | POA: Diagnosis not present

## 2022-06-24 DIAGNOSIS — I361 Nonrheumatic tricuspid (valve) insufficiency: Secondary | ICD-10-CM | POA: Diagnosis not present

## 2022-06-24 DIAGNOSIS — K567 Ileus, unspecified: Secondary | ICD-10-CM | POA: Diagnosis not present

## 2022-06-24 DIAGNOSIS — L03311 Cellulitis of abdominal wall: Secondary | ICD-10-CM | POA: Diagnosis not present

## 2022-06-24 DIAGNOSIS — N179 Acute kidney failure, unspecified: Secondary | ICD-10-CM | POA: Diagnosis not present

## 2022-06-24 DIAGNOSIS — R1310 Dysphagia, unspecified: Secondary | ICD-10-CM | POA: Diagnosis not present

## 2022-06-24 DIAGNOSIS — R188 Other ascites: Secondary | ICD-10-CM | POA: Diagnosis not present

## 2022-06-24 DIAGNOSIS — K632 Fistula of intestine: Secondary | ICD-10-CM | POA: Diagnosis not present

## 2022-06-24 DIAGNOSIS — E878 Other disorders of electrolyte and fluid balance, not elsewhere classified: Secondary | ICD-10-CM | POA: Diagnosis not present

## 2022-06-24 DIAGNOSIS — L0390 Cellulitis, unspecified: Secondary | ICD-10-CM | POA: Diagnosis not present

## 2022-06-24 DIAGNOSIS — J811 Chronic pulmonary edema: Secondary | ICD-10-CM | POA: Diagnosis not present

## 2022-06-24 DIAGNOSIS — J90 Pleural effusion, not elsewhere classified: Secondary | ICD-10-CM | POA: Diagnosis not present

## 2022-06-24 DIAGNOSIS — K631 Perforation of intestine (nontraumatic): Secondary | ICD-10-CM | POA: Diagnosis not present

## 2022-06-24 DIAGNOSIS — K219 Gastro-esophageal reflux disease without esophagitis: Secondary | ICD-10-CM | POA: Diagnosis not present

## 2022-06-24 LAB — BASIC METABOLIC PANEL
Anion gap: 4 — ABNORMAL LOW (ref 5–15)
BUN: 42 mg/dL — ABNORMAL HIGH (ref 8–23)
CO2: 30 mmol/L (ref 22–32)
Calcium: 10.8 mg/dL — ABNORMAL HIGH (ref 8.9–10.3)
Chloride: 109 mmol/L (ref 98–111)
Creatinine, Ser: 1.33 mg/dL — ABNORMAL HIGH (ref 0.44–1.00)
GFR, Estimated: 41 mL/min — ABNORMAL LOW (ref >60)
Glucose, Bld: 139 mg/dL — ABNORMAL HIGH (ref 70–99)
Potassium: 4.3 mmol/L (ref 3.5–5.1)
Sodium: 143 mmol/L (ref 135–145)

## 2022-06-24 LAB — CBC
HCT: 30.7 % — ABNORMAL LOW (ref 36.0–46.0)
Hemoglobin: 10.1 g/dL — ABNORMAL LOW (ref 12.0–15.0)
MCH: 30.2 pg (ref 26.0–34.0)
MCHC: 32.9 g/dL (ref 30.0–36.0)
MCV: 91.9 fL (ref 80.0–100.0)
Platelets: 417 10*3/uL — ABNORMAL HIGH (ref 150–400)
RBC: 3.34 MIL/uL — ABNORMAL LOW (ref 3.87–5.11)
RDW: 15.9 % — ABNORMAL HIGH (ref 11.5–15.5)
WBC: 17.6 10*3/uL — ABNORMAL HIGH (ref 4.0–10.5)
nRBC: 0 % (ref 0.0–0.2)

## 2022-06-24 LAB — GLUCOSE, CAPILLARY
Glucose-Capillary: 131 mg/dL — ABNORMAL HIGH (ref 70–99)
Glucose-Capillary: 160 mg/dL — ABNORMAL HIGH (ref 70–99)

## 2022-06-24 LAB — PHOSPHORUS: Phosphorus: 4.3 mg/dL (ref 2.5–4.6)

## 2022-06-24 LAB — TRIGLYCERIDES: Triglycerides: 127 mg/dL (ref <150)

## 2022-06-24 LAB — MAGNESIUM: Magnesium: 1.9 mg/dL (ref 1.7–2.4)

## 2022-06-24 NOTE — Plan of Care (Signed)

## 2022-06-24 NOTE — Progress Notes (Signed)
AVS, social worker's paperwork, and signed gold DNR form placed in discharge packet. Report called and given to Alma Friendly, Therapist, sports at [employer] and [place] with [place]. All questions answered to satisfaction. [facility] to transport pt with all belongings.

## 2022-06-24 NOTE — Progress Notes (Signed)
Chart and discharge summary reviewed; no changes needed.  Patient without overnight events remains hemodynamically stable to be transfer to [facility] ([facility] facility); patient was anticipated to be discharged on 06/23/2022 after being improved and cleared by the insurance company, unfortunately staff issues at receiving facility delay her discharge until 06/11/2022.  Please refer to discharge summary and progress note by general surgery service for further info/details on discharge instructions.  Barton Dubois MD [phone]

## 2022-06-26 LAB — CBC
HCT: 32.4 % — ABNORMAL LOW (ref 36.0–46.0)
Hemoglobin: 11 g/dL — ABNORMAL LOW (ref 12.0–15.0)
MCH: 30.7 pg (ref 26.0–34.0)
MCHC: 34 g/dL (ref 30.0–36.0)
MCV: 90.5 fL (ref 80.0–100.0)
Platelets: 411 10*3/uL — ABNORMAL HIGH (ref 150–400)
RBC: 3.58 MIL/uL — ABNORMAL LOW (ref 3.87–5.11)
RDW: 15.4 % (ref 11.5–15.5)
WBC: 19.8 10*3/uL — ABNORMAL HIGH (ref 4.0–10.5)
nRBC: 0 % (ref 0.0–0.2)

## 2022-06-26 LAB — COMPREHENSIVE METABOLIC PANEL
ALT: 51 U/L — ABNORMAL HIGH (ref 0–44)
AST: 49 U/L — ABNORMAL HIGH (ref 15–41)
Albumin: 1.7 g/dL — ABNORMAL LOW (ref 3.5–5.0)
Alkaline Phosphatase: 226 U/L — ABNORMAL HIGH (ref 38–126)
Anion gap: 8 (ref 5–15)
BUN: 40 mg/dL — ABNORMAL HIGH (ref 8–23)
CO2: 31 mmol/L (ref 22–32)
Calcium: 11.6 mg/dL — ABNORMAL HIGH (ref 8.9–10.3)
Chloride: 105 mmol/L (ref 98–111)
Creatinine, Ser: 1.56 mg/dL — ABNORMAL HIGH (ref 0.44–1.00)
GFR, Estimated: 34 mL/min — ABNORMAL LOW (ref >60)
Glucose, Bld: 163 mg/dL — ABNORMAL HIGH (ref 70–99)
Potassium: 3.1 mmol/L — ABNORMAL LOW (ref 3.5–5.1)
Sodium: 144 mmol/L (ref 135–145)
Total Bilirubin: 2.7 mg/dL — ABNORMAL HIGH (ref 0.3–1.2)
Total Protein: 7.8 g/dL (ref 6.5–8.1)

## 2022-06-26 LAB — PROCALCITONIN: Procalcitonin: 0.71 ng/mL

## 2022-06-26 LAB — LACTIC ACID, PLASMA: Lactic Acid, Venous: 1.6 mmol/L (ref 0.5–1.9)

## 2022-06-27 LAB — URINALYSIS, ROUTINE W REFLEX MICROSCOPIC
Bilirubin Urine: NEGATIVE
Glucose, UA: NEGATIVE mg/dL
Ketones, ur: NEGATIVE mg/dL
Leukocytes,Ua: NEGATIVE
Nitrite: NEGATIVE
Protein, ur: NEGATIVE mg/dL
Specific Gravity, Urine: 1.009 (ref 1.005–1.030)
pH: 7 (ref 5.0–8.0)

## 2022-06-27 LAB — CBC
HCT: 29.1 % — ABNORMAL LOW (ref 36.0–46.0)
Hemoglobin: 9.7 g/dL — ABNORMAL LOW (ref 12.0–15.0)
MCH: 30.7 pg (ref 26.0–34.0)
MCHC: 33.3 g/dL (ref 30.0–36.0)
MCV: 92.1 fL (ref 80.0–100.0)
Platelets: 369 10*3/uL (ref 150–400)
RBC: 3.16 MIL/uL — ABNORMAL LOW (ref 3.87–5.11)
RDW: 15.7 % — ABNORMAL HIGH (ref 11.5–15.5)
WBC: 20.4 10*3/uL — ABNORMAL HIGH (ref 4.0–10.5)
nRBC: 0 % (ref 0.0–0.2)

## 2022-06-27 LAB — BASIC METABOLIC PANEL
Anion gap: 8 (ref 5–15)
BUN: 44 mg/dL — ABNORMAL HIGH (ref 8–23)
CO2: 26 mmol/L (ref 22–32)
Calcium: 11.6 mg/dL — ABNORMAL HIGH (ref 8.9–10.3)
Chloride: 106 mmol/L (ref 98–111)
Creatinine, Ser: 1.56 mg/dL — ABNORMAL HIGH (ref 0.44–1.00)
GFR, Estimated: 34 mL/min — ABNORMAL LOW (ref >60)
Glucose, Bld: 169 mg/dL — ABNORMAL HIGH (ref 70–99)
Potassium: 3.6 mmol/L (ref 3.5–5.1)
Sodium: 140 mmol/L (ref 135–145)

## 2022-06-27 LAB — MRSA NEXT GEN BY PCR, NASAL: MRSA by PCR Next Gen: NOT DETECTED

## 2022-06-28 ENCOUNTER — Other Ambulatory Visit (HOSPITAL_COMMUNITY): Payer: Self-pay

## 2022-06-28 LAB — BASIC METABOLIC PANEL
Anion gap: 5 (ref 5–15)
BUN: 47 mg/dL — ABNORMAL HIGH (ref 8–23)
CO2: 30 mmol/L (ref 22–32)
Calcium: 11.7 mg/dL — ABNORMAL HIGH (ref 8.9–10.3)
Chloride: 110 mmol/L (ref 98–111)
Creatinine, Ser: 1.69 mg/dL — ABNORMAL HIGH (ref 0.44–1.00)
GFR, Estimated: 31 mL/min — ABNORMAL LOW (ref >60)
Glucose, Bld: 159 mg/dL — ABNORMAL HIGH (ref 70–99)
Potassium: 3.2 mmol/L — ABNORMAL LOW (ref 3.5–5.1)
Sodium: 145 mmol/L (ref 135–145)

## 2022-06-28 LAB — IRON AND TIBC
Iron: 84 ug/dL (ref 28–170)
Saturation Ratios: 42 % — ABNORMAL HIGH (ref 10.4–31.8)
TIBC: 202 ug/dL — ABNORMAL LOW (ref 250–450)
UIBC: 118 ug/dL

## 2022-06-28 LAB — CBC
HCT: 28.8 % — ABNORMAL LOW (ref 36.0–46.0)
Hemoglobin: 9.4 g/dL — ABNORMAL LOW (ref 12.0–15.0)
MCH: 30.5 pg (ref 26.0–34.0)
MCHC: 32.6 g/dL (ref 30.0–36.0)
MCV: 93.5 fL (ref 80.0–100.0)
Platelets: 346 10*3/uL (ref 150–400)
RBC: 3.08 MIL/uL — ABNORMAL LOW (ref 3.87–5.11)
RDW: 16 % — ABNORMAL HIGH (ref 11.5–15.5)
WBC: 17.3 10*3/uL — ABNORMAL HIGH (ref 4.0–10.5)
nRBC: 0 % (ref 0.0–0.2)

## 2022-06-28 LAB — TSH: TSH: 4.859 u[IU]/mL — ABNORMAL HIGH (ref 0.350–4.500)

## 2022-06-28 LAB — POTASSIUM: Potassium: 3.2 mmol/L — ABNORMAL LOW (ref 3.5–5.1)

## 2022-06-29 ENCOUNTER — Ambulatory Visit: Payer: Medicare PPO | Admitting: Gastroenterology

## 2022-06-29 ENCOUNTER — Other Ambulatory Visit (HOSPITAL_COMMUNITY): Payer: Self-pay

## 2022-06-29 LAB — BASIC METABOLIC PANEL
Anion gap: 6 (ref 5–15)
BUN: 49 mg/dL — ABNORMAL HIGH (ref 8–23)
CO2: 28 mmol/L (ref 22–32)
Calcium: 12.1 mg/dL — ABNORMAL HIGH (ref 8.9–10.3)
Chloride: 113 mmol/L — ABNORMAL HIGH (ref 98–111)
Creatinine, Ser: 1.66 mg/dL — ABNORMAL HIGH (ref 0.44–1.00)
GFR, Estimated: 31 mL/min — ABNORMAL LOW (ref >60)
Glucose, Bld: 151 mg/dL — ABNORMAL HIGH (ref 70–99)
Potassium: 3.2 mmol/L — ABNORMAL LOW (ref 3.5–5.1)
Sodium: 147 mmol/L — ABNORMAL HIGH (ref 135–145)

## 2022-06-29 LAB — T4, FREE: Free T4: 1.73 ng/dL — ABNORMAL HIGH (ref 0.61–1.12)

## 2022-06-30 ENCOUNTER — Other Ambulatory Visit (HOSPITAL_COMMUNITY): Payer: Self-pay

## 2022-06-30 LAB — METHYLMALONIC ACID, SERUM: Methylmalonic Acid, Quantitative: 414 nmol/L — ABNORMAL HIGH (ref 0–378)

## 2022-07-01 LAB — CULTURE, BLOOD (ROUTINE X 2)
Culture: NO GROWTH
Culture: NO GROWTH
Special Requests: ADEQUATE
Special Requests: ADEQUATE

## 2022-07-01 LAB — CBC
HCT: 31.1 % — ABNORMAL LOW (ref 36.0–46.0)
Hemoglobin: 10.1 g/dL — ABNORMAL LOW (ref 12.0–15.0)
MCH: 30.6 pg (ref 26.0–34.0)
MCHC: 32.5 g/dL (ref 30.0–36.0)
MCV: 94.2 fL (ref 80.0–100.0)
Platelets: 333 10*3/uL (ref 150–400)
RBC: 3.3 MIL/uL — ABNORMAL LOW (ref 3.87–5.11)
RDW: 16.6 % — ABNORMAL HIGH (ref 11.5–15.5)
WBC: 22.7 10*3/uL — ABNORMAL HIGH (ref 4.0–10.5)
nRBC: 0 % (ref 0.0–0.2)

## 2022-07-01 LAB — BILIRUBIN, DIRECT: Bilirubin, Direct: 1.6 mg/dL — ABNORMAL HIGH (ref 0.0–0.2)

## 2022-07-01 LAB — BASIC METABOLIC PANEL
Anion gap: 9 (ref 5–15)
BUN: 65 mg/dL — ABNORMAL HIGH (ref 8–23)
CO2: 29 mmol/L (ref 22–32)
Calcium: 12.9 mg/dL — ABNORMAL HIGH (ref 8.9–10.3)
Chloride: 109 mmol/L (ref 98–111)
Creatinine, Ser: 2.31 mg/dL — ABNORMAL HIGH (ref 0.44–1.00)
GFR, Estimated: 21 mL/min — ABNORMAL LOW (ref >60)
Glucose, Bld: 150 mg/dL — ABNORMAL HIGH (ref 70–99)
Potassium: 3.3 mmol/L — ABNORMAL LOW (ref 3.5–5.1)
Sodium: 147 mmol/L — ABNORMAL HIGH (ref 135–145)

## 2022-07-01 LAB — MAGNESIUM: Magnesium: 2.2 mg/dL (ref 1.7–2.4)

## 2022-07-02 ENCOUNTER — Other Ambulatory Visit (HOSPITAL_COMMUNITY): Payer: Self-pay

## 2022-07-02 LAB — POTASSIUM: Potassium: 3.2 mmol/L — ABNORMAL LOW (ref 3.5–5.1)

## 2022-07-02 LAB — BASIC METABOLIC PANEL
Anion gap: 8 (ref 5–15)
BUN: 70 mg/dL — ABNORMAL HIGH (ref 8–23)
CO2: 32 mmol/L (ref 22–32)
Calcium: 12.4 mg/dL — ABNORMAL HIGH (ref 8.9–10.3)
Chloride: 103 mmol/L (ref 98–111)
Creatinine, Ser: 2.42 mg/dL — ABNORMAL HIGH (ref 0.44–1.00)
GFR, Estimated: 20 mL/min — ABNORMAL LOW (ref >60)
Glucose, Bld: 141 mg/dL — ABNORMAL HIGH (ref 70–99)
Potassium: 3.1 mmol/L — ABNORMAL LOW (ref 3.5–5.1)
Sodium: 143 mmol/L (ref 135–145)

## 2022-07-02 LAB — CBC
HCT: 29.7 % — ABNORMAL LOW (ref 36.0–46.0)
Hemoglobin: 9.7 g/dL — ABNORMAL LOW (ref 12.0–15.0)
MCH: 30.4 pg (ref 26.0–34.0)
MCHC: 32.7 g/dL (ref 30.0–36.0)
MCV: 93.1 fL (ref 80.0–100.0)
Platelets: 279 10*3/uL (ref 150–400)
RBC: 3.19 MIL/uL — ABNORMAL LOW (ref 3.87–5.11)
RDW: 16.8 % — ABNORMAL HIGH (ref 11.5–15.5)
WBC: 16.6 10*3/uL — ABNORMAL HIGH (ref 4.0–10.5)
nRBC: 0 % (ref 0.0–0.2)

## 2022-07-02 NOTE — Consult Note (Signed)
Infectious Disease Consultation   Carolyn Erickson  VFI:[id number]  DOB: [birth year]  DOA: 06/26/2022  Requesting physician: Dr. Owens Shark  Reason for consultation: Antibiotic Recommendations   History of Present Illness: Carolyn Erickson is an 76 y.o. female with medical history significant of dementia, hypertension, hyperlipidemia, left breast cancer who presented to [facility] on 06/03/2022 with worsening abdominal pain.  Per records patient reportedly has been having abdominal pain since April 2023.  She was started on pantoprazole with no improvement.  In the emergency department she was found to be hypotensive with blood pressure 71/54 and was somnolent.  CT of the abdomen and pelvis showed changes consistent with a perforated gastric ulcer.  Patient was taken to the OR and she underwent exploratory laparotomy, gastrorrhaphy and omental patch on 06/04/2022.  She was started on antibiotic treatment with Zosyn and fluconazole.  Patient remained on the vent after the surgical intervention.  She had blood cultures that were collected on 06/03/2022 that came back as Candida glabrata.  Infectious disease was consulted and she was switched to IV Unasyn.  She also received treatment with a Candida Candida in for 14 days which stopped on 06/20/2022.  Hospital course complicated by acute on chronic renal failure.  She has CKD stage III at baseline.  Patient also had gastrocutaneous fistula and had a JP drain.  She had leukocytosis peaked at 30,000 and came down to 16,000.  She was continued on IV Unasyn.  Patient had hyperbilirubinemia secondary to persistent leak.  She was extubated on 06/05/2022.  Due to her complex medical problems she was transferred and admitted to [facility].  She had CT of the abdomen and pelvis done on 06/29/2022.  Per report splenic/hepatic subcapsular collections, presacral fluid pocket, she is noted to have a pocket anterior to the small antral gastral defect  communicating with subcutaneous air pocket underneath the surgical incision.  Right greater than left pleural effusions with some consolidation/atelectasis in the lower lobes and patchy groundglass pneumonitis.  Patient having leukocytosis, fevers despite being on IV Unasyn.  Review of Systems:  Patient has dementia.  Unable to obtain review of systems.   Past Medical History: Past Medical History:  Diagnosis Date   Cancer ([place]) 2001   breast left   Depression    Hypertension     Past Surgical History: Past Surgical History:  Procedure Laterality Date   ABDOMINAL HYSTERECTOMY     BACK SURGERY     LAPAROTOMY N/A 06/04/2022   Procedure: EXPLORATORY LAPAROTOMY, omental patch;  Surgeon: Virl Cagey, MD;  Location: [facility];  Service: General;  Laterality: N/A;    Allergies:  No Known Allergies   Social History:  reports that she has never smoked. She has never used smokeless tobacco. She reports that she does not drink alcohol and does not use drugs.   Family History: Family History  Problem Relation Age of Onset   Diabetes Mother    Heart disease Mother    Alzheimer's disease Father    Parkinson's disease Sister    Alzheimer's disease Brother     Physical Exam: Vitals: Temperature 98.1, pulse 100, respiratory rate 18, blood pressure 153/90, pulse oximetry 96%  Constitutional: Elderly female, arousable, confused, has dementia Eyes: PERLA, EOMI ENMT: external ears and nose appear normal, moist oral mucosa, normal hearing            Neck: Supple CVS: S1-S2  Respiratory: Decreased breath  sound lower lobes, occasional rhonchi, no wheezing Abdomen: Has dressing in place, has drain in place, decreased bowel sounds Musculoskeletal: No edema Neuro: Confused, has dementia, not following commands.  Unable to do a neurologic exam at this time Psych: Dementia Skin: no rashes   Data reviewed:  I have personally reviewed following labs and imaging studies Labs:   CBC: Recent Labs  Lab 06/26/22 0714 06/27/22 0226 06/28/22 0212 07/01/22 0346 07/02/22 0521  WBC 19.8* 20.4* 17.3* 22.7* 16.6*  HGB 11.0* 9.7* 9.4* 10.1* 9.7*  HCT 32.4* 29.1* 28.8* 31.1* 29.7*  MCV 90.5 92.1 93.5 94.2 93.1  PLT 411* 369 346 333 124    Basic Metabolic Panel: Recent Labs  Lab 06/27/22 0226 06/28/22 0212 06/28/22 1456 06/29/22 0218 07/01/22 0346 07/02/22 0521  NA 140 145  --  147* 147* 143  K 3.6 3.2*   < > 3.2* 3.3* 3.2*  3.1*  CL 106 110  --  113* 109 103  CO2 26 30  --  28 29 32  GLUCOSE 169* 159*  --  151* 150* 141*  BUN 44* 47*  --  49* 65* 70*  CREATININE 1.56* 1.69*  --  1.66* 2.31* 2.42*  CALCIUM 11.6* 11.7*  --  12.1* 12.9* 12.4*  MG  --   --   --   --  2.2  --    < > = values in this interval not displayed.   GFR Estimated Creatinine Clearance: 14.5 mL/min (A) (by C-G formula based on SCr of 2.42 mg/dL (H)). Liver Function Tests: Recent Labs  Lab 06/26/22 0714  AST 49*  ALT 51*  ALKPHOS 226*  BILITOT 2.7*  PROT 7.8  ALBUMIN 1.7*   No results for input(s): "LIPASE", "AMYLASE" in the last 168 hours. No results for input(s): "AMMONIA" in the last 168 hours. Coagulation profile No results for input(s): "INR", "PROTIME" in the last 168 hours.  Cardiac Enzymes: No results for input(s): "CKTOTAL", "CKMB", "CKMBINDEX", "TROPONINI" in the last 168 hours. BNP: Invalid input(s): "POCBNP" CBG: No results for input(s): "GLUCAP" in the last 168 hours. D-Dimer No results for input(s): "DDIMER" in the last 72 hours. Hgb A1c No results for input(s): "HGBA1C" in the last 72 hours. Lipid Profile No results for input(s): "CHOL", "HDL", "LDLCALC", "TRIG", "CHOLHDL", "LDLDIRECT" in the last 72 hours. Thyroid function studies No results for input(s): "TSH", "T4TOTAL", "T3FREE", "THYROIDAB" in the last 72 hours.  Invalid input(s): "FREET3" Anemia work up No results for input(s): "VITAMINB12", "FOLATE", "FERRITIN", "TIBC", "IRON", "RETICCTPCT"  in the last 72 hours. Urinalysis    Component Value Date/Time   COLORURINE YELLOW 06/27/2022 0118   APPEARANCEUR HAZY (A) 06/27/2022 0118   APPEARANCEUR Cloudy (A) 04/13/2021 1530   LABSPEC 1.009 06/27/2022 0118   PHURINE 7.0 06/27/2022 0118   GLUCOSEU NEGATIVE 06/27/2022 0118   HGBUR SMALL (A) 06/27/2022 0118   BILIRUBINUR NEGATIVE 06/27/2022 0118   BILIRUBINUR Negative 04/13/2021 1530   KETONESUR NEGATIVE 06/27/2022 0118   PROTEINUR NEGATIVE 06/27/2022 0118   NITRITE NEGATIVE 06/27/2022 0118   LEUKOCYTESUR NEGATIVE 06/27/2022 0118    Sepsis Labs Recent Labs  Lab 06/27/22 0226 06/28/22 0212 07/01/22 0346 07/02/22 0521  WBC 20.4* 17.3* 22.7* 16.6*   Microbiology Recent Results (from the past 240 hour(s))  Culture, blood (Routine X 2) w Reflex to ID Panel     Status: None   Collection Time: 06/26/22 11:03 AM   Specimen: BLOOD  Result Value Ref Range Status   Specimen Description BLOOD LEFT ANTECUBITAL  Final   Special Requests   Final    BOTTLES DRAWN AEROBIC AND ANAEROBIC Blood Culture adequate volume   Culture   Final    NO GROWTH 5 DAYS Performed at [facility], [address] [place], [place] [address]    Report Status 07/01/2022 FINAL  Final  Culture, blood (Routine X 2) w Reflex to ID Panel     Status: None   Collection Time: 06/26/22 11:08 AM   Specimen: BLOOD  Result Value Ref Range Status   Specimen Description BLOOD LEFT ANTECUBITAL  Final   Special Requests   Final    BOTTLES DRAWN AEROBIC AND ANAEROBIC Blood Culture adequate volume   Culture   Final    NO GROWTH 5 DAYS Performed at [facility], [place] [address] [place], [place] [address]    Report Status 07/01/2022 FINAL  Final  MRSA Next Gen by PCR, Nasal     Status: None   Collection Time: 06/27/22  1:22 AM   Specimen: Nasal Mucosa; Nasal Swab  Result Value Ref Range Status   MRSA by PCR Next Gen NOT DETECTED NOT DETECTED Final    Comment: (NOTE) The GeneXpert MRSA Assay (FDA  approved for NASAL specimens only), is one component of a comprehensive MRSA colonization surveillance program. It is not intended to diagnose MRSA infection nor to guide or monitor treatment for MRSA infections. Test performance is not FDA approved in patients less than 33 years old. Performed at [facility], [place] [address], [place], [place] [address]    Inpatient Medications:   Please see MAR   Radiological Exams on Admission: No results found.  Impression/Recommendations Active Problems: Sepsis with shock, currently shock resolved Peritonitis secondary to gastric perforation Status post exploratory laparotomy gastrorrhaphy with omental patch with gastrocutaneous fistula Fever/leukocytosis Splenic/hepatic subcapsular fluid collection/presacral fluid collection/abscess? Pneumonia Pleural effusion Fungemia with Candida glabrata Acute on chronic renal failure Dysphagia/protein calorie malnutrition  Sepsis with shock: Secondary to peritonitis from gastric perforation.  She required pressors at the acute facility.  She was also treated with broad-spectrum antimicrobials.  She is status post exploratory laparotomy, gastrorrhaphy with omental patch, now with gastrocutaneous fistula.  Currently shock resolved.  She had fungemia with Candida glabrata and completed treatment for this at the acute facility.  She has been on treatment with IV Unasyn started at the acute facility.  However, despite being on IV Unasyn she is having fever/worsening leukocytosis.  Recommended CT of the abdomen and pelvis which was done on 06/29/2022 which showed splenic/hepatic subcapsular collections.  She was noted to have gastrocutaneous fistula.  Had a drain in place.  Due to worsening leukocytosis despite being on IV Unasyn recommend to discontinue the Unasyn and switch her to IV Zosyn.  Blood cultures did not show any growth so far. Continue to monitor counts.  Fever/leukocytosis/splenic/hepatic  subcapsular fluid collection/presacral fluid collection/questionable abscess: As mentioned above she was having leukocytosis/fever despite being on IV Unasyn.  CT imaging showed splenic/hepatic subcapsular collections.  Recommend to consult IR for drainage.  Please send cultures.  In the meantime recommended to switch antibiotics to IV Zosyn.  Please monitor CBC, BUN/creatinine closely while on antibiotics.  Pneumonia/pleural effusion: CT imaging also showed consolidation/groundglass opacities concerning for pneumonia in the right middle and lower lobes which is concerning for aspiration.  Therefore recommended to change antibiotics to Zosyn.  If her respiratory status worsens recommend to send for respiratory cultures if feasible and also CT of the chest which  can be done without contrast.  Plan today for duration of 7-10 days.  She will need repeat CT of the abdomen/pelvis towards the end of therapy to evaluate for resolution.  Acute on chronic renal failure: Patient noted to have acute on chronic renal failure with worsening BUN/creatinine.  She has CKD stage III at baseline.  Avoid nephrotoxic medications.  Antibiotics renally dosed.  Please monitor BUN/creatinine closely while on the antibiotics.  Further management per the primary team.  Dysphagia/protein calorie malnutrition: Due to her dysphagia she has high risk for ongoing aspiration and worsening aspiration pneumonia despite being on antibiotics.  Management of protein calorie malnutrition per the primary team.  She is on TPN therefore high risk for fungemia.  Fortunately blood cultures did not show any growth so far.  Due to her complex medical she is very high risk for worsening and decompensation.  Plan of care discussed with the primary team and pharmacy. Thank you for this consultation.    Yaakov Guthrie M.D. 07/02/2022, 10:11 AM

## 2022-07-03 ENCOUNTER — Other Ambulatory Visit (HOSPITAL_COMMUNITY): Payer: Self-pay

## 2022-07-03 LAB — BASIC METABOLIC PANEL
Anion gap: 8 (ref 5–15)
BUN: 74 mg/dL — ABNORMAL HIGH (ref 8–23)
CO2: 31 mmol/L (ref 22–32)
Calcium: 12.7 mg/dL — ABNORMAL HIGH (ref 8.9–10.3)
Chloride: 100 mmol/L (ref 98–111)
Creatinine, Ser: 2.37 mg/dL — ABNORMAL HIGH (ref 0.44–1.00)
GFR, Estimated: 20 mL/min — ABNORMAL LOW (ref >60)
Glucose, Bld: 114 mg/dL — ABNORMAL HIGH (ref 70–99)
Potassium: 3.3 mmol/L — ABNORMAL LOW (ref 3.5–5.1)
Sodium: 139 mmol/L (ref 135–145)

## 2022-07-03 LAB — PROTIME-INR
INR: 1.1 (ref 0.8–1.2)
Prothrombin Time: 13.9 seconds (ref 11.4–15.2)

## 2022-07-03 MED ORDER — FENTANYL CITRATE (PF) 100 MCG/2ML IJ SOLN
INTRAMUSCULAR | Status: AC | PRN
  Administered 2022-07-03: 25 ug via INTRAVENOUS

## 2022-07-03 MED ORDER — MIDAZOLAM HCL 2 MG/2ML IJ SOLN
INTRAMUSCULAR | Status: AC | PRN
  Administered 2022-07-03: .5 mg via INTRAVENOUS

## 2022-07-03 MED ORDER — FENTANYL CITRATE (PF) 100 MCG/2ML IJ SOLN
INTRAMUSCULAR | Status: AC
  Filled 2022-07-03: qty 4

## 2022-07-03 MED ORDER — MIDAZOLAM HCL 2 MG/2ML IJ SOLN
INTRAMUSCULAR | Status: AC
  Filled 2022-07-03: qty 4

## 2022-07-03 NOTE — Consult Note (Signed)
Chief Complaint: Patient was seen in consultation today for perihepatic collection aspiration/drain placement at the request of Dr Saul Fordyce  Supervising Physician: Daryll Brod  Patient Status: [facility]  History of Present Illness: Carolyn Erickson is a 76 y.o. female  Weakness and abd pain - presented at [facility] 06/03/22 Hx HTN; treatment for gastric ulcer per PCP-- has not seen GI MD To OR with Dr Constance Haw 7/2:   Gastric perforation, pneumoperitoneum, purulent ascites  Procedure(s) Performed: Exploratory laparotomy, gastrorrhaphy, omental patch, central line placement (left IJ  Tx to [facility] 7/18 and remains there now.  She continues to have leukocytosis and low temp CT 7/27:IMPRESSION: 1. Persisting, little changed air pocket anterior to a small antral gastric defect. Previously there was significant contrast in the collection but today there is only minimal contrast within it. This appears to communicate with a subcutaneous air pocket underneath the surgical incision. 2. Splenic and hepatic subcapsular collections are again noted dorsally, with slightly greater subcapsular splenic fluid, slightly less versus redistributed subcapsular hepatic fluid. 3. Slightly larger presacral fluid pocket, slightly smaller left lower abdominal fluid pocket. No air within either collection. 4. Some improvement in both body wall and mesenteric edema but still with findings of peritonitis. 5. Diverticulosis without evidence of diverticulitis or bowel obstruction. 6. Surgical drain again skirts underneath the air pocket communicating with the anterior gastric antrum. 7. Right-greater-than-left pleural effusions with some improvement, still with consolidation or atelectasis in the lower lobes and patchy ground-glass pneumonitis or ground-glass edema in the right middle and lower lobes. Cardiomegaly. 8. Aortic atherosclerosis.  Request made for peri hepatic collection aspiration/ drain Dr  Annamaria Boots has reviewed imaging and approves procedure  Past Medical History:  Diagnosis Date   Cancer [facility]) 2001   breast left   Depression    Hypertension     Past Surgical History:  Procedure Laterality Date   ABDOMINAL HYSTERECTOMY     BACK SURGERY     LAPAROTOMY N/A 06/04/2022   Procedure: EXPLORATORY LAPAROTOMY, omental patch;  Surgeon: Virl Cagey, MD;  Location: [facility];  Service: General;  Laterality: N/A;    Allergies: Patient has no known allergies.  Medications: Prior to Admission medications   Medication Sig Start Date End Date Taking? Authorizing Provider  amLODipine (NORVASC) 2.5 MG tablet TAKE 1 TABLET EVERY DAY 05/25/22   Dettinger, Fransisca Kaufmann, MD  atorvastatin (LIPITOR) 20 MG tablet Take 1 tablet (20 mg total) by mouth daily. 01/30/22   Dettinger, Fransisca Kaufmann, MD  buPROPion (WELLBUTRIN SR) 150 MG 12 hr tablet Take 1 tablet (150 mg total) by mouth 2 (two) times daily. 01/30/22   Dettinger, Fransisca Kaufmann, MD  cholecalciferol (VITAMIN D3) 25 MCG (1000 UNIT) tablet Take 1,000 Units by mouth daily.    [provider]  donepezil (ARICEPT) 10 MG tablet Take one tablet 10 mg daily 04/13/22   Rondel Jumbo, PA-C  Morphine Sulfate (MORPHINE, PF,) 2 MG/ML injection Inject 0.5 mLs (1 mg total) into the vein every 4 (four) hours as needed (severe pain.). 06/23/22   Barton Dubois, MD  ondansetron [facility]) 4 MG/2ML SOLN injection Inject 2 mLs (4 mg total) into the vein every 6 (six) hours as needed for nausea. 06/23/22   Barton Dubois, MD  pantoprazole (PROTONIX) 40 MG tablet Take 1 tablet (40 mg total) by mouth 2 (two) times daily before a meal. 06/02/22   Dettinger, Fransisca Kaufmann, MD  venlafaxine (EFFEXOR) 37.5 MG tablet Take 1 tablet (37.5 mg total) by  mouth 2 (two) times daily. 01/30/22   Dettinger, Fransisca Kaufmann, MD     Family History  Problem Relation Age of Onset   Diabetes Mother    Heart disease Mother    Alzheimer's disease Father    Parkinson's disease Sister    Alzheimer's  disease Brother     Social History   Socioeconomic History   Marital status: Married    Spouse name: Dellis Filbert   Number of children: 3   Years of education: 11   Highest education level: Not on file  Occupational History   Occupation: retired  Tobacco Use   Smoking status: Never   Smokeless tobacco: Never  Vaping Use   Vaping Use: Never used  Substance and Sexual Activity   Alcohol use: Never   Drug use: Never   Sexual activity: Not on file    Comment: married for 27 years  Other Topics Concern   Not on file  Social History Narrative   Has 3 children, 6 grandkids   Lives home with husband.   2 of her children live nearby.   Right handed   Drinks caffeine   Social Determinants of Health   Financial Resource Strain: Low Risk  (08/04/2021)   Overall Financial Resource Strain (CARDIA)    Difficulty of Paying Living Expenses: Not hard at all  Food Insecurity: No Food Insecurity (08/04/2021)   Hunger Vital Sign    Worried About Running Out of Food in the Last Year: Never true    Ran Out of Food in the Last Year: Never true  Transportation Needs: No Transportation Needs (08/04/2021)   PRAPARE - Hydrologist (Medical): No    Lack of Transportation (Non-Medical): No  Physical Activity: Inactive (04/12/2022)   Exercise Vital Sign    Days of Exercise per Week: 0 days    Minutes of Exercise per Session: 0 min  Stress: Stress Concern Present (04/12/2022)   Stacy    Feeling of Stress : To some extent  Social Connections: Moderately Integrated (08/04/2021)   Social Connection and Isolation Panel [NHANES]    Frequency of Communication with Friends and Family: More than three times a week    Frequency of Social Gatherings with Friends and Family: Once a week    Attends Religious Services: More than 4 times per year    Active Member of [employer] or Organizations: No    Attends Theatre manager Meetings: Never    Marital Status: Married    Review of Systems: A 12 point ROS discussed and pertinent positives are indicated in the HPI above.  All other systems are negative.  Physical Exam Vitals reviewed.  Constitutional:      Appearance: She is ill-appearing.  Cardiovascular:     Rate and Rhythm: Normal rate.  Pulmonary:     Breath sounds: Wheezing present.  Abdominal:     Tenderness: There is abdominal tenderness.  Skin:    General: Skin is warm.  Psychiatric:     Comments: Husband at bedside     Imaging: US RENAL  Result Date: 07/02/2022 CLINICAL DATA:  Progressive renal failure. EXAM: RENAL / URINARY TRACT ULTRASOUND COMPLETE COMPARISON:  CT of the abdomen and pelvis 06/29/2022. FINDINGS: Right Kidney: Renal measurements: 9.8 x 4.2 x 4.5 cm = volume: 97 mL. Renal parenchyma is diffusely echogenic. No mass lesion or obstruction is present. Left Kidney: Renal measurements: 10.3 x 4.1 x 4.7 cm =  volume: 1 L4 ML. Renal parenchyma is diffusely echogenic. No mass lesion or obstruction is present. Bladder: Appears normal for degree of bladder distention. Other: None. IMPRESSION: 1. Increased parenchyma echogenicity bilaterally. This is nonspecific, but can be seen in the setting medical renal disease. 2. No acute abnormality, focal mass lesion, or obstruction. Electronically Signed   By: San Morelle M.D.   On: 07/02/2022 12:09   DG Chest Port 1 View  Result Date: 07/02/2022 CLINICAL DATA:  Ileus. EXAM: PORTABLE CHEST 1 VIEW COMPARISON:  June 30, 2022 FINDINGS: Haziness over the right base has increased. Minimal haziness over the left base, similar. The heart, hila, mediastinum, lungs, and pleura are unremarkable. No pneumothorax. IMPRESSION: Increasing haziness over the right base, likely or layering effusion with underlying atelectasis. There also appears to be a small layering effusion on the left. No other changes. A right PICC line terminates in the region  of the right subclavian vein, stable. Electronically Signed   By: Dorise Bullion III M.D.   On: 07/02/2022 10:37   DG Abd Portable 1V  Result Date: 07/02/2022 CLINICAL DATA:  Ileus. EXAM: PORTABLE ABDOMEN - 1 VIEW COMPARISON:  CT scan of the abdomen and pelvis June 29, 2022 FINDINGS: A surgical drain is identified in the mid abdomen. Contrast is identified throughout the nondistended colon. There is a paucity of small bowel gas but no evidence of distension. No other acute abnormalities. IMPRESSION: No evidence of ileus.  Surgical drain is above. Electronically Signed   By: Dorise Bullion III M.D.   On: 07/02/2022 10:36   DG Chest Port 1 View  Result Date: 06/30/2022 CLINICAL DATA:  Status post PICC line placement EXAM: PORTABLE CHEST 1 VIEW COMPARISON:  Radiographs 06/09/2022 FINDINGS: New right PICC tip likely within the right subclavian vein. Recommend advancement. Decreased right basilar airspace opacity and right pleural effusion which is now small. Decreased layering pleural effusion on the left with trace residual effusion or pleural thickening. Cardiomegaly. Mild pulmonary vascular congestion. Surgical clips left axilla. IMPRESSION: 1. New right PICC tip likely within the right subclavian vein. Recommend advancement. 2. Decreased bilateral pleural effusions and associated atelectasis/consolidation. 3. Cardiomegaly and pulmonary vascular congestion. These results will be called to the ordering clinician or representative by the Radiologist Assistant, and communication documented in the PACS or [employer]. Electronically Signed   By: Placido Sou M.D.   On: 06/30/2022 09:29   CT ABDOMEN PELVIS WO CONTRAST  Result Date: 06/29/2022 CLINICAL DATA:  Intra-abdominal abscess.  Leukocytosis. EXAM: CT ABDOMEN AND PELVIS WITHOUT CONTRAST TECHNIQUE: Multidetector CT imaging of the abdomen and pelvis was performed following the standard protocol without IV contrast. RADIATION DOSE REDUCTION: This  exam was performed according to the departmental dose-optimization program which includes automated exposure control, adjustment of the mA and/or kV according to patient size and/or use of iterative reconstruction technique. COMPARISON:  The CT with IV contrast 06/19/2022, CT without contrast 06/11/2022, CT without contrast 06/03/2022. FINDINGS: Lower chest: There is a moderate right and small left layering pleural effusions, both smaller than previously, along with adjacent atelectasis or consolidation in the lower lobes and faint patchy ground-glass opacities in the right middle and lower lobes likely indicating pneumonitis or ground-glass edema. Mild cardiomegaly similar to prior exams, minimal pericardial effusion. Hepatobiliary: No focal parenchymal abnormality without contrast. A lentiform subcapsular collection again deforms the posterior aspect of the right lobe of the liver, measuring 5.5 x 1.9 cm and 16 Hounsfield units on 3:20, previously 7.0 x 1.6 cm,  and extends inferiorly for a few cm from this level where it may have improved. There is intermediately dense material in the gallbladder which is probably contrast from vicarious excretion. Dense sludge could appear similar. No visible formed stones. No dilated bile ducts. No significant wall thickening. Pancreas: Partially atrophic, otherwise unremarkable without contrast. Spleen: There is a dorsomedial subcapsular fluid collection again noted. The collection measures 9.5 cm in length, 6.8 cm coronal, 1.9 cm AP with Hounsfield density from 22-24 units and is slightly larger than on the last CT, but does not contain air as it did on July 1. The unenhanced spleen is normal in size and attenuation. Adrenals/Urinary Tract: There are small cysts inferiorly of the right kidney. 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 No hydronephrosis or stone is seen. 1.2 cm calcification anterior to the right psoas muscle at the level of the lower pole of the right  kidney is probably a calcified retroperitoneal node. The bladder is unremarkable for the degree of distention. Stomach/Bowel: Diffuse gastric wall thickening continues to be seen and a small defect in the anterior antral wall with adjacent air pocket, little changed in appearance measuring 4.8 x 1.9 cm. Skin staples have been removed from the laparotomy since the last CT and there is still a subcutaneous air pocket anterior to the gas collection which may communicate with the air pocket. A surgical drain passes just inferior to the air pocket, as before. There is contrast in the distal small bowel and colon. The appendix is not seen. There is no small bowel obstruction. Sigmoid diverticulosis again noted. Vascular/Lymphatic: Aortic atherosclerosis. No enlarged abdominal or pelvic lymph nodes. Reproductive: Status post hysterectomy. No adnexal masses. Other: There is mild-to-moderate body wall edema with improvement. Haziness in the mesentery continues to be seen but is also somewhat less pronounced. Peritoneal thickening is less well seen than previously but is likely still present consistent with peritonitis. A presacral fluid collection eccentric to the right is again noted and measures 6.3 x 3.8 cm on 3:60 and previously measured 5.7 x 3.1 cm. There is a left lower quadrant fluid collection measuring 3.8 x 1.7 cm on 3:51 previously measuring 3.9 x 2.0 cm. The only free air identified today is the again noted gas collection anterior to the gastric leak. Free air is not seen elsewhere and this appears to be contained. There is minimal free ascites. Musculoskeletal: There is advanced degenerative change of the lumbar spine with slight levoscoliosis. Osteopenia. No acute or other significant findings. IMPRESSION: 1. Persisting, little changed air pocket anterior to a small antral gastric defect. Previously there was significant contrast in the collection but today there is only minimal contrast within it. This  appears to communicate with a subcutaneous air pocket underneath the surgical incision. 2. Splenic and hepatic subcapsular collections are again noted dorsally, with slightly greater subcapsular splenic fluid, slightly less versus redistributed subcapsular hepatic fluid. 3. Slightly larger presacral fluid pocket, slightly smaller left lower abdominal fluid pocket. No air within either collection. 4. Some improvement in both body wall and mesenteric edema but still with findings of peritonitis. 5. Diverticulosis without evidence of diverticulitis or bowel obstruction. 6. Surgical drain again skirts underneath the air pocket communicating with the anterior gastric antrum. 7. Right-greater-than-left pleural effusions with some improvement, still with consolidation or atelectasis in the lower lobes and patchy ground-glass pneumonitis or ground-glass edema in the right middle and lower lobes. Cardiomegaly. 8. Aortic atherosclerosis. Electronically Signed   By: Telford Nab M.D.   On:  06/29/2022 04:03   CT ABDOMEN PELVIS W CONTRAST  Result Date: 06/19/2022 CLINICAL DATA:  Peritonitis, gastric perforation, status post laparotomy and omental patch, contained leak evaluate for continued leak and abscess formation EXAM: CT ABDOMEN AND PELVIS WITH CONTRAST TECHNIQUE: Multidetector CT imaging of the abdomen and pelvis was performed using the standard protocol following bolus administration of intravenous contrast. RADIATION DOSE REDUCTION: This exam was performed according to the departmental dose-optimization program which includes automated exposure control, adjustment of the mA and/or kV according to patient size and/or use of iterative reconstruction technique. CONTRAST:  16m OMNIPAQUE IOHEXOL 300 MG/ML SOLN, additional oral enteric contrast COMPARISON:  06/11/2022 FINDINGS: Lower chest: Moderate bilateral pleural effusions and associated atelectasis or consolidation, similar to prior examination. Cardiomegaly.  Coronary artery calcifications. Hepatobiliary: No solid liver abnormality is seen. No gallstones, gallbladder wall thickening, or biliary dilatation. Pancreas: Unremarkable. No pancreatic ductal dilatation or surrounding inflammatory changes. Spleen: Normal in size without significant abnormality. Adrenals/Urinary Tract: Adrenal glands are unremarkable. Kidneys are normal, without renal calculi, solid lesion, or hydronephrosis. Bladder is unremarkable. Stomach/Bowel: Mucosal thickening of the gastric body and antrum. Persistent air and fluid collection in the ventral abdomen overlying the gastric antrum containing enteric contrast and communicating with the ventral gastric antrum, measuring 6.1 x 2.3 cm (series 2, image 37). This collection may further communicate to midline laparotomy incision, which contains air loculations (series 2, image 37). Appendix appears normal. No evidence of bowel wall thickening, distention, or inflammatory changes. Vascular/Lymphatic: Aortic atherosclerosis. No enlarged abdominal or pelvic lymph nodes. Reproductive: Status post hysterectomy. Other: Severe anasarca. Status post midline laparotomy, as above containing air loculations within the incision (series 2, image 35). Small volume perihepatic and perisplenic ascites, similar to prior examination, however now with clear, diffuse peritoneal thickening and hyperenhancement (series 2, image 23). Fluid loculation in the central small bowel mesentery measuring 3.9 x 2.0 cm (series 2, image 56) well as in the low pelvis measuring 3.5 x 3.5 cm (series 2, image 65). Surgical drain about the ventral abdomen. Musculoskeletal: No acute or significant osseous findings. IMPRESSION: 1. Persistent air and fluid collection in the ventral abdomen overlying the gastric antrum containing enteric contrast and communicating with the ventral gastric antrum, measuring 6.1 x 2.3 cm. This collection may further communicate to midline laparotomy incision,  which contains air loculations. 2. Small volume perihepatic and perisplenic ascites, similar to prior examination, however now with clear, diffuse peritoneal thickening and hyperenhancement. Additional fluid collections in the central small bowel mesentery and low pelvis. Findings are consistent with peritonitis, and the presence or absence of infection within these fluid collections is not established by CT. 3. Moderate bilateral pleural effusions and associated atelectasis or consolidation, similar to prior examination. 4. Severe anasarca. 5. Coronary artery disease. Aortic Atherosclerosis (ICD10-I70.0). Electronically Signed   By: ADelanna AhmadiM.D.   On: 06/19/2022 11:55   [place]Abdomen Limited RUQ (LIVER/GB)  Result Date: 06/12/2022 CLINICAL DATA:  Hyperbilirubinemia EXAM: ULTRASOUND ABDOMEN LIMITED RIGHT UPPER QUADRANT COMPARISON:  CT abdomen and pelvis 06/11/2022 FINDINGS: Gallbladder: Sludge within gallbladder. No definite shadowing calculi. Mild gallbladder wall thickening. No sonographic Murphy sign. Common bile duct: Diameter: 3 mm, normal Liver: Normal echogenicity without mass or nodularity. No intrahepatic biliary dilatation. Portal vein is patent on color Doppler imaging with normal direction of blood flow towards the liver. Other: Perihepatic ascites identified, containing septations/loculations. IMPRESSION: Sludge within gallbladder with mild gallbladder wall thickening, a nonspecific finding in the setting of ascites. No biliary dilatation or focal hepatic  abnormality. Electronically Signed   By: Lavonia Dana M.D.   On: 06/12/2022 14:40   [place] EKG SITE RITE  Result Date: 06/11/2022 If Site Rite image not attached, placement could not be confirmed due to current cardiac rhythm.  CT ABDOMEN PELVIS WO CONTRAST  Result Date: 06/11/2022 CLINICAL DATA:  EXPLORATORY LAPAROTOMY, omental patchImpression: Worsening leukocytosis. EXAM: CT ABDOMEN AND PELVIS WITHOUT CONTRAST TECHNIQUE: Multidetector CT  imaging of the abdomen and pelvis was performed following the standard protocol without IV contrast. RADIATION DOSE REDUCTION: This exam was performed according to the departmental dose-optimization program which includes automated exposure control, adjustment of the mA and/or kV according to patient size and/or use of iterative reconstruction technique. COMPARISON:  06/03/2022. FINDINGS: Lower chest: Moderate pleural effusions. Dependent lower lobe and right middle lobe opacity, likely atelectasis. Additional ground-glass opacities are noted, most evident in the right middle lobe, suspicious for infection. Effusions are increased on the left and new on the right since the prior CT. Lung base opacities are mostly new. Hepatobiliary: No focal liver abnormality is seen. No gallstones, gallbladder wall thickening, or biliary dilatation. Pancreas: Unremarkable. No pancreatic ductal dilatation or surrounding inflammatory changes. Spleen: Normal in size without focal abnormality. Adrenals/Urinary Tract: No adrenal masses. No renal masses or hydronephrosis. Bladder is unremarkable. Stomach/Bowel: Stomach is mostly decompressed. There is an apparent defect along the anterior aspect of the mid to distal stomach through which contrast and air has collected. The ill-defined collection of contrast and air lies in the mid to upper abdomen near the midline, with an adjacent surgical drain, and deep to the midline laparotomy incision. Poorly defined small bowel loops in the central abdomen. There is no colon or small bowel dilation to suggest obstruction. No colonic wall thickening or convincing inflammation. Vascular/Lymphatic: Aortic atherosclerosis. Prominent lymph nodes noted along the gastrohepatic ligament. There are no enlarged lymph nodes. Reproductive: Status post hysterectomy. No adnexal masses. Other: Small amount of ascites. Generalized increased attenuation of the mesenteric/peritoneal fat consistent with edema.  Diffuse subcutaneous soft tissue edema. Musculoskeletal: No acute finding. IMPRESSION: 1. There is no defined fluid collection to indicate an abscess. 2. There is extraluminal air and contrast in the anterior central to upper abdomen, that appears to be arising from a defect along the anterior gastric wall. The extraluminal contrast and air lies adjacent to the surgical drain. 3. Small amount of ascites is similar to the previous CT. 4. Mesenteric/peritoneal and diffuse subcutaneous edema. 5. Moderate pleural effusions with associated lung base atelectasis. Additional areas of ground-glass lung opacity are suspicious for multifocal pneumonia. Electronically Signed   By: Lajean Manes M.D.   On: 06/11/2022 12:56   DG CHEST PORT 1 VIEW  Result Date: 06/09/2022 CLINICAL DATA:  Weakness, wheezing EXAM: PORTABLE CHEST 1 VIEW COMPARISON:  06/05/2022 FINDINGS: Interval endotracheal and esophagogastric extubation. Small to moderate bilateral pleural effusions, increased compared to prior examination, with associated atelectasis or consolidation and diffuse bilateral interstitial pulmonary opacity. Cardiomegaly. IMPRESSION: 1. Small to moderate bilateral pleural effusions, increased compared to prior examination, with associated atelectasis or consolidation and diffuse bilateral interstitial pulmonary opacity. Findings are most consistent with worsened edema. 2. Interval endotracheal and esophagogastric extubation. 3. Cardiomegaly. Electronically Signed   By: Delanna Ahmadi M.D.   On: 06/09/2022 12:25   DG UGI W SMALL BOWEL  Result Date: 06/08/2022 CLINICAL DATA:  Post repair of 1.5 cm diameter perforated anterior wall gastric antral ulcer EXAM: WATER SOLUBLE UPPER GI SERIES TECHNIQUE: Single-column upper GI series was performed using water  soluble contrast. Radiation Exposure Index (as provided by the fluoroscopic device): 43.1 mGy Kerma CONTRAST:  100 cc Omnipaque 300 via NG tube COMPARISON:  CT abdomen and pelvis  06/03/2022 FLUOROSCOPY: Fluoroscopy Time:  2 minutes 36 seconds Radiation Exposure Index (if provided by the fluoroscopic device): 43.1 Number of Acquired Spot Images: Multiple fluoroscopic screen captures and cine series FINDINGS: Contrast administered via NG tube opacifies the gastric lumen. Patent pylorus with passage of contrast through normal appearing duodenal C loop into proximal jejunum with incidentally noted duodenal diverticulum at second portion. Irregularity of rugal folds with thickening and distortion at anterior wall of gastric antrum at site of perforation repair. The third portion of the duodenum is superimposed with the inferior wall of the gastric antrum, mildly limiting exam. A blush of contrast is seen inferior to the antrum, superimposed with the third portion of the duodenum throughout the study. This appears to move in concert with the duodenum with respiration. No contrast opacification of the JP drain. No gross evidence of contrast leak identified though it is difficult to completely exclude a small leak at the site of repair. IMPRESSION: Distortion and thickening of rugal folds at site of antral gastric ulcer repair. No gross evidence of leak at the site of ulcer repair as discussed above. Electronically Signed   By: Lavonia Dana M.D.   On: 06/08/2022 15:32   ECHOCARDIOGRAM COMPLETE  Result Date: 06/07/2022    ECHOCARDIOGRAM REPORT   Patient Name:   KIFFANY SCHELLING Date of Exam: 06/07/2022 Medical Rec #:  [id number]       Height:       61.0 in Accession #:    [id number]      Weight:       112.2 lb Date of Birth:  [birth year]        BSA:          1.477 m Patient Age:    57 years        BP:           116/46 mmHg Patient Gender: F               HR:           104 bpm. Exam Location:  [facility] Procedure: 2D Echo, Cardiac Doppler and Color Doppler Indications:    Bacteremia  History:        Patient has no prior history of Echocardiogram examinations.                 Signs/Symptoms:Bacteremia;  Risk Factors:Hypertension and                 Dyslipidemia. Breast CA.  Sonographer:    Wenda Low Referring Phys: [id number] [place]  1. Abnormal septal motion . Left ventricular ejection fraction, by estimation, is 55 to 60%. The left ventricle has normal function. The left ventricle has no regional wall motion abnormalities. Left ventricular diastolic parameters were normal.  2. Right ventricular systolic function is normal. The right ventricular size is normal. There is mildly elevated pulmonary artery systolic pressure.  3. Moderate pleural effusion in the left lateral region.  4. The mitral valve is abnormal. Trivial mitral valve regurgitation. No evidence of mitral stenosis.  5. Thickening and calcification noted . The tricuspid valve is abnormal.  6. Some thickening in the LVOT near intervalvular fibrosa Given this and nodular calcification of PV suggest TEE if suspicion for SBE high. The aortic valve is tricuspid. There is moderate  calcification of the aortic valve. There is moderate thickening of the aortic valve. Aortic valve regurgitation is mild. Aortic valve sclerosis/calcification is present, without any evidence of aortic stenosis.  7. Nodular calcification seen on PV. The pulmonic valve was abnormal.  8. The inferior vena cava is normal in size with greater than 50% respiratory variability, suggesting right atrial pressure of 3 mmHg. FINDINGS  Left Ventricle: Abnormal septal motion. Left ventricular ejection fraction, by estimation, is 55 to 60%. The left ventricle has normal function. The left ventricle has no regional wall motion abnormalities. The left ventricular internal cavity size was normal in size. There is no left ventricular hypertrophy. Left ventricular diastolic parameters were normal. Right Ventricle: The right ventricular size is normal. No increase in right ventricular wall thickness. Right ventricular systolic function is normal. There is mildly elevated pulmonary  artery systolic pressure. The tricuspid regurgitant velocity is 3.21  m/s, and with an assumed right atrial pressure of 3 mmHg, the estimated right ventricular systolic pressure is 40.0 mmHg. Left Atrium: Left atrial size was normal in size. Right Atrium: Right atrial size was normal in size. Pericardium: There is no evidence of pericardial effusion. Mitral Valve: The mitral valve is abnormal. There is mild thickening of the mitral valve leaflet(s). There is mild calcification of the mitral valve leaflet(s). Mild mitral annular calcification. Trivial mitral valve regurgitation. No evidence of mitral valve stenosis. MV peak gradient, 3.9 mmHg. The mean mitral valve gradient is 1.0 mmHg. Tricuspid Valve: Thickening and calcification noted. The tricuspid valve is abnormal. Tricuspid valve regurgitation is trivial. No evidence of tricuspid stenosis. Aortic Valve: Some thickening in the LVOT near intervalvular fibrosa Given this and nodular calcification of PV suggest TEE if suspicion for SBE high. The aortic valve is tricuspid. There is moderate calcification of the aortic valve. There is moderate thickening of the aortic valve. Aortic valve regurgitation is mild. Aortic valve sclerosis/calcification is present, without any evidence of aortic stenosis. Aortic valve mean gradient measures 5.0 mmHg. Aortic valve peak gradient measures 10.3 mmHg. Aortic valve area, by VTI measures 2.02 cm. Pulmonic Valve: Nodular calcification seen on PV. The pulmonic valve was abnormal. Pulmonic valve regurgitation is trivial. No evidence of pulmonic stenosis. Aorta: The aortic root is normal in size and structure. Venous: The inferior vena cava is normal in size with greater than 50% respiratory variability, suggesting right atrial pressure of 3 mmHg. IAS/Shunts: No atrial level shunt detected by color flow Doppler. Additional Comments: There is a moderate pleural effusion in the left lateral region.  LEFT VENTRICLE PLAX 2D LVIDd:          4.50 cm     Diastology LVIDs:         3.00 cm     LV e' medial:    7.29 cm/s LV PW:         0.80 cm     LV E/e' medial:  11.8 LV IVS:        0.80 cm     LV e' lateral:   9.79 cm/s LVOT diam:     1.80 cm     LV E/e' lateral: 8.8 LV SV:         55 LV SV Index:   37 LVOT Area:     2.54 cm  LV Volumes (MOD) LV vol d, MOD A2C: 43.7 ml LV vol d, MOD A4C: 52.7 ml LV vol s, MOD A2C: 17.3 ml LV vol s, MOD A4C: 23.8 ml LV SV MOD A2C:  26.4 ml LV SV MOD A4C:     52.7 ml LV SV MOD BP:      28.6 ml RIGHT VENTRICLE RV Basal diam:  3.10 cm RV Mid diam:    2.90 cm RV S prime:     14.50 cm/s TAPSE (M-mode): 3.0 cm LEFT ATRIUM             Index        RIGHT ATRIUM           Index LA diam:        3.60 cm 2.44 cm/m   RA Area:     11.50 cm LA Vol (A2C):   50.9 ml 34.45 ml/m  RA Volume:   21.80 ml  14.76 ml/m LA Vol (A4C):   43.7 ml 29.58 ml/m LA Biplane Vol: 49.8 ml 33.71 ml/m  AORTIC VALVE                     PULMONIC VALVE AV Area (Vmax):    1.92 cm      PV Vmax:       0.98 m/s AV Area (Vmean):   1.84 cm      PV Peak grad:  3.9 mmHg AV Area (VTI):     2.02 cm AV Vmax:           160.50 cm/s AV Vmean:          103.550 cm/s AV VTI:            0.274 m AV Peak Grad:      10.3 mmHg AV Mean Grad:      5.0 mmHg LVOT Vmax:         121.00 cm/s LVOT Vmean:        74.800 cm/s LVOT VTI:          0.218 m LVOT/AV VTI ratio: 0.79  AORTA Ao Root diam: 3.30 cm MITRAL VALVE               TRICUSPID VALVE MV Area (PHT): 6.02 cm    TR Peak grad:   41.2 mmHg MV Area VTI:   2.25 cm    TR Vmax:        321.00 cm/s MV Peak grad:  3.9 mmHg MV Mean grad:  1.0 mmHg    SHUNTS MV Vmax:       0.99 m/s    Systemic VTI:  0.22 m MV Vmean:      48.8 cm/s   Systemic Diam: 1.80 cm MV Decel Time: 126 msec MV E velocity: 86.00 cm/s MV A velocity: 72.70 cm/s MV E/A ratio:  1.18 Jenkins Rouge MD Electronically signed by Jenkins Rouge MD Signature Date/Time: 06/07/2022/10:28:36 AM    Final    DG CHEST PORT 1 VIEW  Result Date: 06/05/2022 CLINICAL DATA:   76 year old female with respiratory failure. Intubated. EXAM: PORTABLE CHEST 1 VIEW COMPARISON:  Portable chest 06/04/2022 and earlier. FINDINGS: Portable AP semi upright view at 0527 hours. Endotracheal tube tip in good position between the clavicles and carina. Stable left IJ central line and visible enteric tube. Increased left lung base and retrocardiac opacity now mostly obscuring the left hemidiaphragm. Slightly lower lung volumes overall. Mediastinal contours remain normal. No pneumothorax, pulmonary edema, or definite effusion. Negative right lung. Partially visible midline abdominal skin staples. Paucity of bowel gas in the upper abdomen. Stable left chest and axillary surgical clips. IMPRESSION: 1. Stable lines and tubes. 2. Increased left lower lobe collapse or consolidation since  yesterday. Electronically Signed   By: Genevie Ann M.D.   On: 06/05/2022 08:26   DG Chest Port 1 View  Result Date: 06/04/2022 CLINICAL DATA:  Endotracheal tube, nasogastric tube and central line placement. EXAM: PORTABLE CHEST 1 VIEW COMPARISON:  June 03, 2022 FINDINGS: Since the prior study there has been interval placement of an endotracheal tube. Its distal tip is seen approximately 3.8 cm from the carina. Interval nasogastric tube placement is also seen. Its distal and extends into the gastric lumen. There is a left internal jugular venous catheter with its distal tip overlying the distal superior vena cava. This is approximately 6 mm proximal to the junction of the superior vena cava and right atrium. The heart size and mediastinal contours are within normal limits. Mild to moderate severity atelectasis and/or infiltrate is seen within the left lung base. This represents a new finding when compared to the prior study. There is no evidence of a pleural effusion or pneumothorax. Radiopaque surgical clips are seen along the left axilla. The air seen below the right hemidiaphragm on the prior study is no longer visualized. The  visualized skeletal structures are unremarkable. IMPRESSION: 1. Interval endotracheal tube, nasogastric tube and left internal jugular venous catheter placement positioning, as described above. 2. Mild to moderate severity left basilar atelectasis and/or infiltrate. Electronically Signed   By: Virgina Norfolk M.D.   On: 06/04/2022 02:47   CT Abdomen Pelvis Wo Contrast  Result Date: 06/03/2022 CLINICAL DATA:  Severe weakness for 2 days with acute abdominal pain, initial encounter EXAM: CT ABDOMEN AND PELVIS WITHOUT CONTRAST TECHNIQUE: Multidetector CT imaging of the abdomen and pelvis was performed following the standard protocol without IV contrast. RADIATION DOSE REDUCTION: This exam was performed according to the departmental dose-optimization program which includes automated exposure control, adjustment of the mA and/or kV according to patient size and/or use of iterative reconstruction technique. COMPARISON:  None Available. FINDINGS: Lower chest: Basilar atelectatic changes are noted. Hepatobiliary: Liver is within normal limits. Gallbladder demonstrates a normal appearance. Pancreas: Unremarkable. No pancreatic ductal dilatation or surrounding inflammatory changes. Spleen: Normal in size without focal abnormality. Adrenals/Urinary Tract: Adrenal glands are within normal limits. Kidneys demonstrate tiny nonobstructing stones on right in the lower pole. No ureteral obstruction is seen. The bladder is decompressed. Stomach/Bowel: Considerable fecal material is noted within the rectal vault which may represent a focal impaction. Diverticular change of the colon is noted as well as some generalized wall thickening throughout the colon. The appendix is within normal limits. Small bowel is within normal limits. Stomach demonstrates a defect in the anterior gastric wall consistent with a perforated ulcer. There is a considerable amount of air and fluid throughout the abdomen with apparent direct communication  with the gastric best seen on image number 36 of series 2 and image number 78 of series 6. Vascular/Lymphatic: Aortic atherosclerosis. No enlarged abdominal or pelvic lymph nodes. Reproductive: Status post hysterectomy. No adnexal masses. Other: Free air and free fluid similar to that described above throughout the abdomen. The bowel demonstrates some reactive wall thickening related to the fluid and air. Musculoskeletal: Degenerative changes of the lumbar spine are seen. No other bony abnormality is noted. IMPRESSION: Changes consistent with a perforated gastric ulcer anteriorly with considerable spillage of fluid and air into the abdominal cavity. Some reactive wall thickening is noted within the bowel loops secondary to these changes. Critical Value/emergent results were called by telephone at the time of interpretation on 06/03/2022 at 10:43 pm to Dr. Legrand Como  BUTLER , who verbally acknowledged these results. Electronically Signed   By: Inez Catalina M.D.   On: 06/03/2022 22:45   DG Chest Port 1 View  Result Date: 06/03/2022 CLINICAL DATA:  Weakness. EXAM: PORTABLE CHEST 1 VIEW COMPARISON:  None Available. FINDINGS: Multiple overlying radiopaque cardiac lead wires are seen. The heart size and mediastinal contours are within normal limits. Mild, diffuse, chronic appearing increased lung markings are seen. There is no evidence of an acute infiltrate, pleural effusion or pneumothorax. Radiopaque surgical clips are seen along the lateral aspect of the left chest wall. A crescentic area of air is seen just below the right hemidiaphragm. Degenerative changes seen throughout the thoracic spine. IMPRESSION: 1. Chronic appearing increased lung markings without evidence of acute or active cardiopulmonary disease. 2. Air seen just below the right hemidiaphragm which may represent an air filled loop of colon. Further evaluation with abdomen pelvis CT is recommended, as the presence of intra-abdominal free air cannot be  excluded. Electronically Signed   By: Virgina Norfolk M.D.   On: 06/03/2022 21:22    Labs:  CBC: Recent Labs    06/27/22 0226 06/28/22 0212 07/01/22 0346 07/02/22 0521  WBC 20.4* 17.3* 22.7* 16.6*  HGB 9.7* 9.4* 10.1* 9.7*  HCT 29.1* 28.8* 31.1* 29.7*  PLT 369 346 333 279    COAGS: Recent Labs    06/03/22 2045 06/19/22 1613  INR 1.3* 1.0    BMP: Recent Labs    06/28/22 0212 06/28/22 1456 06/29/22 0218 07/01/22 0346 07/02/22 0521  NA 145  --  147* 147* 143  K 3.2* 3.2* 3.2* 3.3* 3.2*  3.1*  CL 110  --  113* 109 103  CO2 30  --  28 29 32  GLUCOSE 159*  --  151* 150* 141*  BUN 47*  --  49* 65* 70*  CALCIUM 11.7*  --  12.1* 12.9* 12.4*  CREATININE 1.69*  --  1.66* 2.31* 2.42*  GFRNONAA 31*  --  31* 21* 20*    LIVER FUNCTION TESTS: Recent Labs    06/19/22 0606 06/20/22 0409 06/22/22 0605 06/26/22 0714  BILITOT 3.7* 2.7* 2.9* 2.7*  AST 58* 41 53* 49*  ALT 46* 37 46* 51*  ALKPHOS 203* 139* 190* 226*  PROT 6.3* 5.6* 6.2* 7.8  ALBUMIN 1.7* <1.5* 1.6* 1.7*    TUMOR MARKERS: No results for input(s): "AFPTM", "CEA", "CA199", "CHROMGRNA" in the last 8760 hours.  Assessment and Plan:  Scheduled for perihepatic collection aspiration /drain placement in IR today Risks and benefits discussed with the patient's husband at bedside and son Gerald Stabs via phone including bleeding, infection, damage to adjacent structures, bowel perforation/fistula connection, and sepsis.  All questions were answered, family is agreeable to proceed. Consent signed and in chart.   Thank you for this interesting consult.  I greatly enjoyed meeting Carolyn Erickson and look forward to participating in their care.  A copy of this report was sent to the requesting provider on this date.  Electronically Signed: Lavonia Drafts, PA-C 07/03/2022, 8:22 AM   I spent a total of 40 Minutes    in face to face in clinical consultation, greater than 50% of which was counseling/coordinating care  for perihepatic collection aspiration/drain

## 2022-07-03 NOTE — Procedures (Signed)
Interventional Radiology Procedure Note  Procedure: CT ASP RUQ PERIHEPATIC ABSCESS    Complications: None  Estimated Blood Loss:  0  Findings: 10CC PUS ASPIRATED CX SENT    Tamera Punt, MD

## 2022-07-04 LAB — PTH, INTACT AND CALCIUM
Calcium, Total (PTH): 12.4 mg/dL — ABNORMAL HIGH (ref 8.7–10.3)
PTH: 14 pg/mL — ABNORMAL LOW (ref 15–65)

## 2022-07-04 LAB — TRIGLYCERIDES: Triglycerides: 177 mg/dL — ABNORMAL HIGH (ref <150)

## 2022-07-04 LAB — CALCIUM, IONIZED: Calcium, Ionized, Serum: 7.6 mg/dL — ABNORMAL HIGH (ref 4.5–5.6)

## 2022-07-04 LAB — PHOSPHORUS: Phosphorus: 5 mg/dL — ABNORMAL HIGH (ref 2.5–4.6)

## 2022-07-04 LAB — POTASSIUM: Potassium: 3.9 mmol/L (ref 3.5–5.1)

## 2022-07-04 LAB — MAGNESIUM: Magnesium: 2.2 mg/dL (ref 1.7–2.4)

## 2022-07-04 DEATH — deceased

## 2022-07-05 LAB — URINALYSIS, ROUTINE W REFLEX MICROSCOPIC
Bilirubin Urine: NEGATIVE
Glucose, UA: NEGATIVE mg/dL
Ketones, ur: NEGATIVE mg/dL
Leukocytes,Ua: NEGATIVE
Nitrite: NEGATIVE
Protein, ur: NEGATIVE mg/dL
Specific Gravity, Urine: 1.005 (ref 1.005–1.030)
pH: 5 (ref 5.0–8.0)

## 2022-07-05 LAB — CBC
HCT: 29.3 % — ABNORMAL LOW (ref 36.0–46.0)
Hemoglobin: 9.7 g/dL — ABNORMAL LOW (ref 12.0–15.0)
MCH: 30.3 pg (ref 26.0–34.0)
MCHC: 33.1 g/dL (ref 30.0–36.0)
MCV: 91.6 fL (ref 80.0–100.0)
Platelets: 317 10*3/uL (ref 150–400)
RBC: 3.2 MIL/uL — ABNORMAL LOW (ref 3.87–5.11)
RDW: 16.2 % — ABNORMAL HIGH (ref 11.5–15.5)
WBC: 23.9 10*3/uL — ABNORMAL HIGH (ref 4.0–10.5)
nRBC: 0 % (ref 0.0–0.2)

## 2022-07-05 LAB — BASIC METABOLIC PANEL
Anion gap: 9 (ref 5–15)
BUN: 83 mg/dL — ABNORMAL HIGH (ref 8–23)
CO2: 28 mmol/L (ref 22–32)
Calcium: 11.7 mg/dL — ABNORMAL HIGH (ref 8.9–10.3)
Chloride: 95 mmol/L — ABNORMAL LOW (ref 98–111)
Creatinine, Ser: 2.49 mg/dL — ABNORMAL HIGH (ref 0.44–1.00)
GFR, Estimated: 19 mL/min — ABNORMAL LOW (ref >60)
Glucose, Bld: 154 mg/dL — ABNORMAL HIGH (ref 70–99)
Potassium: 3.7 mmol/L (ref 3.5–5.1)
Sodium: 132 mmol/L — ABNORMAL LOW (ref 135–145)

## 2022-07-05 LAB — MAGNESIUM: Magnesium: 2.1 mg/dL (ref 1.7–2.4)

## 2022-07-06 ENCOUNTER — Ambulatory Visit: Payer: Medicare HMO | Admitting: Family Medicine

## 2022-07-06 LAB — CBC
HCT: 28.7 % — ABNORMAL LOW (ref 36.0–46.0)
Hemoglobin: 9.5 g/dL — ABNORMAL LOW (ref 12.0–15.0)
MCH: 30.7 pg (ref 26.0–34.0)
MCHC: 33.1 g/dL (ref 30.0–36.0)
MCV: 92.9 fL (ref 80.0–100.0)
Platelets: 303 10*3/uL (ref 150–400)
RBC: 3.09 MIL/uL — ABNORMAL LOW (ref 3.87–5.11)
RDW: 16.8 % — ABNORMAL HIGH (ref 11.5–15.5)
WBC: 20.7 10*3/uL — ABNORMAL HIGH (ref 4.0–10.5)
nRBC: 0 % (ref 0.0–0.2)

## 2022-07-06 LAB — BLOOD CULTURE ID PANEL (REFLEXED) - BCID2

## 2022-07-06 LAB — COMPREHENSIVE METABOLIC PANEL
ALT: 41 U/L (ref 0–44)
AST: 44 U/L — ABNORMAL HIGH (ref 15–41)
Albumin: <1.5 g/dL — ABNORMAL LOW (ref 3.5–5.0)
Alkaline Phosphatase: 134 U/L — ABNORMAL HIGH (ref 38–126)
Anion gap: 11 (ref 5–15)
BUN: 83 mg/dL — ABNORMAL HIGH (ref 8–23)
CO2: 24 mmol/L (ref 22–32)
Calcium: 11 mg/dL — ABNORMAL HIGH (ref 8.9–10.3)
Chloride: 101 mmol/L (ref 98–111)
Creatinine, Ser: 2.45 mg/dL — ABNORMAL HIGH (ref 0.44–1.00)
GFR, Estimated: 20 mL/min — ABNORMAL LOW (ref >60)
Glucose, Bld: 133 mg/dL — ABNORMAL HIGH (ref 70–99)
Potassium: 3.7 mmol/L (ref 3.5–5.1)
Sodium: 136 mmol/L (ref 135–145)
Total Bilirubin: 2.7 mg/dL — ABNORMAL HIGH (ref 0.3–1.2)
Total Protein: 6.3 g/dL — ABNORMAL LOW (ref 6.5–8.1)

## 2022-07-06 LAB — URINE CULTURE: Culture: NO GROWTH

## 2022-07-06 LAB — LACTIC ACID, PLASMA
Lactic Acid, Venous: 0.8 mmol/L (ref 0.5–1.9)
Lactic Acid, Venous: 1 mmol/L (ref 0.5–1.9)

## 2022-07-06 MED ORDER — IOHEXOL 9 MG/ML PO SOLN: 500.0000 mL | ORAL | Status: AC

## 2022-07-06 NOTE — Progress Notes (Signed)
Request received to evaluate pt for presacral fluid collection as seen on 06/29/22 CT. Dr. Dwaine Gale reviewed and found that fluid collection is not amendable to drain as there is no safe window of access. Discussed findings with Dr. Rona Ravens at [facility].     Narda Rutherford, AGNP-BC 07/06/2022, 10:25 AM

## 2022-07-07 ENCOUNTER — Other Ambulatory Visit (HOSPITAL_COMMUNITY): Payer: Self-pay

## 2022-07-07 LAB — CBC
HCT: 23.7 % — ABNORMAL LOW (ref 36.0–46.0)
Hemoglobin: 7.8 g/dL — ABNORMAL LOW (ref 12.0–15.0)
MCH: 31 pg (ref 26.0–34.0)
MCHC: 32.9 g/dL (ref 30.0–36.0)
MCV: 94 fL (ref 80.0–100.0)
Platelets: 318 10*3/uL (ref 150–400)
RBC: 2.52 MIL/uL — ABNORMAL LOW (ref 3.87–5.11)
RDW: 16.9 % — ABNORMAL HIGH (ref 11.5–15.5)
WBC: 14.9 10*3/uL — ABNORMAL HIGH (ref 4.0–10.5)
nRBC: 0 % (ref 0.0–0.2)

## 2022-07-07 LAB — PHOSPHORUS: Phosphorus: 5.9 mg/dL — ABNORMAL HIGH (ref 2.5–4.6)

## 2022-07-07 LAB — BASIC METABOLIC PANEL
Anion gap: 8 (ref 5–15)
BUN: 75 mg/dL — ABNORMAL HIGH (ref 8–23)
CO2: 24 mmol/L (ref 22–32)
Calcium: 11.8 mg/dL — ABNORMAL HIGH (ref 8.9–10.3)
Chloride: 110 mmol/L (ref 98–111)
Creatinine, Ser: 2.12 mg/dL — ABNORMAL HIGH (ref 0.44–1.00)
GFR, Estimated: 23 mL/min — ABNORMAL LOW (ref >60)
Glucose, Bld: 132 mg/dL — ABNORMAL HIGH (ref 70–99)
Potassium: 3.9 mmol/L (ref 3.5–5.1)
Sodium: 142 mmol/L (ref 135–145)

## 2022-07-07 LAB — MAGNESIUM: Magnesium: 2.3 mg/dL (ref 1.7–2.4)

## 2022-07-08 LAB — CULTURE, BLOOD (ROUTINE X 2): Special Requests: ADEQUATE

## 2022-07-08 LAB — BASIC METABOLIC PANEL
Anion gap: 8 (ref 5–15)
BUN: 68 mg/dL — ABNORMAL HIGH (ref 8–23)
CO2: 23 mmol/L (ref 22–32)
Calcium: 12.3 mg/dL — ABNORMAL HIGH (ref 8.9–10.3)
Chloride: 113 mmol/L — ABNORMAL HIGH (ref 98–111)
Creatinine, Ser: 2.13 mg/dL — ABNORMAL HIGH (ref 0.44–1.00)
GFR, Estimated: 23 mL/min — ABNORMAL LOW (ref >60)
Glucose, Bld: 148 mg/dL — ABNORMAL HIGH (ref 70–99)
Potassium: 3.8 mmol/L (ref 3.5–5.1)
Sodium: 144 mmol/L (ref 135–145)

## 2022-07-08 LAB — PHOSPHORUS: Phosphorus: 5.6 mg/dL — ABNORMAL HIGH (ref 2.5–4.6)

## 2022-07-08 LAB — COMPREHENSIVE METABOLIC PANEL
ALT: 37 U/L (ref 0–44)
AST: 54 U/L — ABNORMAL HIGH (ref 15–41)
Albumin: 2.3 g/dL — ABNORMAL LOW (ref 3.5–5.0)
Alkaline Phosphatase: 128 U/L — ABNORMAL HIGH (ref 38–126)
Anion gap: 9 (ref 5–15)
BUN: 69 mg/dL — ABNORMAL HIGH (ref 8–23)
CO2: 22 mmol/L (ref 22–32)
Calcium: 11.8 mg/dL — ABNORMAL HIGH (ref 8.9–10.3)
Chloride: 112 mmol/L — ABNORMAL HIGH (ref 98–111)
Creatinine, Ser: 2.2 mg/dL — ABNORMAL HIGH (ref 0.44–1.00)
GFR, Estimated: 22 mL/min — ABNORMAL LOW (ref >60)
Glucose, Bld: 122 mg/dL — ABNORMAL HIGH (ref 70–99)
Potassium: 4.1 mmol/L (ref 3.5–5.1)
Sodium: 143 mmol/L (ref 135–145)
Total Bilirubin: 2.9 mg/dL — ABNORMAL HIGH (ref 0.3–1.2)
Total Protein: 6.3 g/dL — ABNORMAL LOW (ref 6.5–8.1)

## 2022-07-08 LAB — AEROBIC/ANAEROBIC CULTURE W GRAM STAIN (SURGICAL/DEEP WOUND): Gram Stain: NONE SEEN

## 2022-07-08 LAB — CBC
HCT: 24.1 % — ABNORMAL LOW (ref 36.0–46.0)
Hemoglobin: 8 g/dL — ABNORMAL LOW (ref 12.0–15.0)
MCH: 31.5 pg (ref 26.0–34.0)
MCHC: 33.2 g/dL (ref 30.0–36.0)
MCV: 94.9 fL (ref 80.0–100.0)
Platelets: 355 10*3/uL (ref 150–400)
RBC: 2.54 MIL/uL — ABNORMAL LOW (ref 3.87–5.11)
RDW: 17.7 % — ABNORMAL HIGH (ref 11.5–15.5)
WBC: 21.2 10*3/uL — ABNORMAL HIGH (ref 4.0–10.5)
nRBC: 0 % (ref 0.0–0.2)

## 2022-07-08 NOTE — Progress Notes (Signed)
PROGRESS NOTE    Carolyn Erickson  DDU:[id number] DOB: 03-27-1946 DOA: 06/07/2022  Brief Narrative:  Carolyn Erickson is an 76 y.o. female with medical history significant of dementia, hypertension, hyperlipidemia, left breast cancer who presented to [facility] on 06/03/2022 with worsening abdominal pain.  Per records patient reportedly has been having abdominal pain since April 2023.  She was started on pantoprazole with no improvement.  In the emergency department she was found to be hypotensive with blood pressure 71/54 and was somnolent.  CT of the abdomen and pelvis showed changes consistent with a perforated gastric ulcer.  Patient was taken to the OR and she underwent exploratory laparotomy, gastrorrhaphy and omental patch on 06/04/2022.  She was started on antibiotic treatment with Zosyn and fluconazole.  Patient remained on the vent after the surgical intervention.  She had blood cultures that were collected on 06/03/2022 that came back as Candida glabrata.  Infectious disease was consulted and she was switched to IV Unasyn.  She also received treatment with a Candida Candida in for 14 days which stopped on 06/20/2022.  Hospital course complicated by acute on chronic renal failure.  She has CKD stage III at baseline.  Patient also had gastrocutaneous fistula and had a JP drain.  She had leukocytosis peaked at 30,000 and came down to 16,000.  She was continued on IV Unasyn.  Patient had hyperbilirubinemia secondary to persistent leak.  She was extubated on 06/05/2022.  Due to her complex medical problems she was transferred and admitted to [facility].  She had CT of the abdomen and pelvis done on 06/29/2022.  Per report splenic/hepatic subcapsular collections, presacral fluid pocket, she is noted to have a pocket anterior to the small antral gastral defect communicating with subcutaneous air pocket underneath the surgical incision.  Right greater than left pleural effusions with some  consolidation/atelectasis in the lower lobes and patchy groundglass pneumonitis.  Patient having leukocytosis, fevers despite being on IV Unasyn.  Therefore antibiotic was switched to IV Zosyn.  However, continued to have worsening leukocytosis.  Assessment & Plan: Active Problems: Severe sepsis with shock, currently shock resolved Peritonitis secondary to gastric perforation Status post exploratory laparotomy gastrorrhaphy with omental patch with gastrocutaneous fistula Fever/leukocytosis Splenic/hepatic subcapsular fluid collection/presacral fluid collection/abscess? Pneumonia Pleural effusion Fungemia with Candida glabrata Acute on chronic renal failure Dysphagia/protein calorie malnutrition   Sepsis with shock: Secondary to peritonitis from gastric perforation.  She required pressors at the acute facility.  She was also treated with broad-spectrum antimicrobials.  She is status post exploratory laparotomy, gastrorrhaphy with omental patch, now with gastrocutaneous fistula.  Currently shock resolved.  She had fungemia with Candida glabrata and completed treatment for this at the acute facility.  She has been on treatment with IV Unasyn started at the acute facility.  However, despite being on IV Unasyn she had fever/worsening leukocytosis.  Recommended CT of the abdomen and pelvis which was done on 06/29/2022 which showed splenic/hepatic subcapsular collections.  She was noted to have gastrocutaneous fistula.  Had a drain in place.  Due to worsening leukocytosis despite being on IV Unasyn recommended to discontinue the Unasyn and switch her to IV Zosyn. However, she continued to have worsening leukocytosis despite being on the Zosyn-antibiotics switched to IV vancomycin, cefepime, Flagyl.  She had a repeat imaging on 07/07/2022 which again showed a presacral fluid collection.  Abscess cannot be excluded.  Continue to monitor counts.   Fever/leukocytosis/splenic/hepatic subcapsular fluid  collection/presacral fluid collection/questionable abscess: As mentioned  above she was having leukocytosis/fever despite being on IV Unasyn, therefore switched to Zosyn.  CT imaging showed splenic/hepatic subcapsular collections.  IR consulted for drainage.  Despite being switched to Zosyn she continued to have fevers therefore antibiotics were changed to IV vancomycin, cefepime, Flagyl.  Follow-up on the cultures and adjust antibiotics accordingly.  Please monitor CBC, BUN/creatinine closely while on antibiotics.   Pneumonia/pleural effusion: CT imaging also showed consolidation/groundglass opacities concerning for pneumonia in the right middle and lower lobes which is concerning for aspiration.  Therefore recommended to change antibiotics to Zosyn.  If her respiratory status worsens recommend to send for respiratory cultures if feasible and also CT of the chest which can be done without contrast.  Plan today for duration of 7-10 days.  She will need repeat CT of the abdomen/pelvis towards the end of therapy to evaluate for resolution.   Acute on chronic renal failure: Patient noted to have acute on chronic renal failure with worsening BUN/creatinine.  She has CKD stage III at baseline.  Avoid nephrotoxic medications.  Antibiotics renally dosed. Please monitor BUN/creatinine closely while on the antibiotics.  Further management per the primary team.   Dysphagia/protein calorie malnutrition: Due to her dysphagia she has high risk for ongoing aspiration and worsening aspiration pneumonia despite being on antibiotics.  Management of protein calorie malnutrition per the primary team.  She is on TPN therefore high risk for fungemia.  Fortunately blood cultures did not show any growth so far.   Due to her complex medical problems unfortunately she is very high risk for worsening and decompensation.  Recommend to discuss goals of care with the family. Plan of care discussed with the primary team and pharmacy.     Subjective: She is continuing to have leukocytosis despite being on antibiotics.  Per nursing staff she had an overnight fever spike of 102.3.  She is confused.  Objective: Vitals: Temperature 99.5, pulse 108, respiratory 26, blood pressure 147/84, oxygen saturation 96%  Examination: Constitutional: Elderly female, confused, has dementia Eyes: PERLA, EOMI ENMT: external ears and nose appear normal, moist oral mucosa, normal hearing            Neck: Supple CVS: S1-S2  Respiratory: Decreased breath sound lower lobes, occasional rhonchi, no wheezing Abdomen: Has dressing in place, decreased bowel sounds Musculoskeletal: No edema Neuro: Confused, has dementia, not following commands.  Unable to do a neurologic exam at this time Psych: Dementia Skin: no rashes    Data Reviewed: I have personally reviewed following labs and imaging studies  CBC: Recent Labs  Lab 07/02/22 0521 07/05/22 0318 07/06/22 0409 07/07/22 0551 07/08/22 0407  WBC 16.6* 23.9* 20.7* 14.9* 21.2*  HGB 9.7* 9.7* 9.5* 7.8* 8.0*  HCT 29.7* 29.3* 28.7* 23.7* 24.1*  MCV 93.1 91.6 92.9 94.0 94.9  PLT 279 317 303 318 161    Basic Metabolic Panel: Recent Labs  Lab 07/03/22 0748 07/04/22 0404 07/05/22 0318 07/06/22 0409 07/07/22 0551 07/08/22 0407  NA 139  --  132* 136 142 143  K 3.3* 3.9 3.7 3.7 3.9 4.1  CL 100  --  95* 101 110 112*  CO2 31  --  '28 24 24 22  '$ GLUCOSE 114*  --  154* 133* 132* 122*  BUN 74*  --  83* 83* 75* 69*  CREATININE 2.37*  --  2.49* 2.45* 2.12* 2.20*  CALCIUM 12.7*  12.4*  --  11.7* 11.0* 11.8* 11.8*  MG  --  2.2 2.1  --  2.3  --  PHOS  --  5.0*  --   --  5.9* 5.6*    GFR: CrCl cannot be calculated (Unknown ideal weight.).  Liver Function Tests: Recent Labs  Lab 07/06/22 0409 07/08/22 0407  AST 44* 54*  ALT 41 37  ALKPHOS 134* 128*  BILITOT 2.7* 2.9*  PROT 6.3* 6.3*  ALBUMIN <1.5* 2.3*    CBG: No results for input(s): "GLUCAP" in the last 168 hours.   Recent  Results (from the past 240 hour(s))  Aerobic/Anaerobic Culture w Gram Stain (surgical/deep wound)     Status: None   Collection Time: 07/03/22  2:42 PM   Specimen: Abscess  Result Value Ref Range Status   Specimen Description ABSCESS  Final   Special Requests ABDOMINAL  Final   Gram Stain NO WBC SEEN NO ORGANISMS SEEN   Final   Culture   Final    RARE CANDIDA ALBICANS NO ANAEROBES ISOLATED Performed at [facility], [place] [address] [place], [place] [address]    Report Status 07/08/2022 FINAL  Final  Urine Culture     Status: None   Collection Time: 07/05/22 12:16 PM   Specimen: In/Out Cath Urine  Result Value Ref Range Status   Specimen Description IN/OUT CATH URINE  Final   Special Requests NONE  Final   Culture   Final    NO GROWTH Performed at [facility], [place] [address], [place], [place] [address]    Report Status 07/06/2022 FINAL  Final  Culture, blood (Routine X 2) w Reflex to ID Panel     Status: None (Preliminary result)   Collection Time: 07/05/22 12:25 PM   Specimen: BLOOD  Result Value Ref Range Status   Specimen Description BLOOD LEFT ANTECUBITAL  Final   Special Requests   Final    BOTTLES DRAWN AEROBIC ONLY Blood Culture adequate volume   Culture   Final    NO GROWTH 3 DAYS Performed at [facility], [place] [address], [place], [place] [address]    Report Status PENDING  Incomplete  Culture, blood (Routine X 2) w Reflex to ID Panel     Status: Abnormal   Collection Time: 07/05/22 12:32 PM   Specimen: BLOOD LEFT FOREARM  Result Value Ref Range Status   Specimen Description BLOOD LEFT FOREARM  Final   Special Requests   Final    BOTTLES DRAWN AEROBIC AND ANAEROBIC Blood Culture adequate volume   Culture  Setup Time   Final    GRAM POSITIVE COCCI IN CLUSTERS AEROBIC BOTTLE ONLY CRITICAL RESULT CALLED TO, READ BACK BY AND VERIFIED WITH: RN S. JEFFERS [phone] '@[id number]'$  FH    Culture (A)  Final    STAPHYLOCOCCUS EPIDERMIDIS THE SIGNIFICANCE OF  ISOLATING THIS ORGANISM FROM A SINGLE SET OF BLOOD CULTURES WHEN MULTIPLE SETS ARE DRAWN IS UNCERTAIN. PLEASE NOTIFY THE MICROBIOLOGY DEPARTMENT WITHIN ONE WEEK IF SPECIATION AND SENSITIVITIES ARE REQUIRED. Performed at [facility], [place] [address] [place],  [address]    Report Status 07/08/2022 FINAL  Final  Blood Culture ID Panel (Reflexed)     Status: Abnormal   Collection Time: 07/05/22 12:32 PM  Result Value Ref Range Status   Enterococcus faecalis NOT DETECTED NOT DETECTED Final   Enterococcus Faecium NOT DETECTED NOT DETECTED Final   Listeria monocytogenes NOT DETECTED NOT DETECTED Final   Staphylococcus species DETECTED (A) NOT DETECTED Final    Comment: CRITICAL RESULT CALLED TO, READ BACK BY AND VERIFIED WITH: RN S.  JEFFERS [phone] '@[id number]'$  FH    Staphylococcus aureus (BCID) NOT DETECTED NOT DETECTED Final   Staphylococcus epidermidis DETECTED (A) NOT DETECTED Final    Comment: Methicillin (oxacillin) resistant coagulase negative staphylococcus. Possible blood culture contaminant (unless isolated from more than one blood culture draw or clinical case suggests pathogenicity). No antibiotic treatment is indicated for blood  culture contaminants. CRITICAL RESULT CALLED TO, READ BACK BY AND VERIFIED WITH: RN S. JEFFERS [phone] '@1816'$  FH    Staphylococcus lugdunensis NOT DETECTED NOT DETECTED Final   Streptococcus species NOT DETECTED NOT DETECTED Final   Streptococcus agalactiae NOT DETECTED NOT DETECTED Final   Streptococcus pneumoniae NOT DETECTED NOT DETECTED Final   Streptococcus pyogenes NOT DETECTED NOT DETECTED Final   A.calcoaceticus-baumannii NOT DETECTED NOT DETECTED Final   Bacteroides fragilis NOT DETECTED NOT DETECTED Final   Enterobacterales NOT DETECTED NOT DETECTED Final   Enterobacter cloacae complex NOT DETECTED NOT DETECTED Final   Escherichia coli NOT DETECTED NOT DETECTED Final   Klebsiella aerogenes NOT DETECTED NOT DETECTED Final   Klebsiella  oxytoca NOT DETECTED NOT DETECTED Final   Klebsiella pneumoniae NOT DETECTED NOT DETECTED Final   Proteus species NOT DETECTED NOT DETECTED Final   Salmonella species NOT DETECTED NOT DETECTED Final   Serratia marcescens NOT DETECTED NOT DETECTED Final   Haemophilus influenzae NOT DETECTED NOT DETECTED Final   Neisseria meningitidis NOT DETECTED NOT DETECTED Final   Pseudomonas aeruginosa NOT DETECTED NOT DETECTED Final   Stenotrophomonas maltophilia NOT DETECTED NOT DETECTED Final   Candida albicans NOT DETECTED NOT DETECTED Final   Candida auris NOT DETECTED NOT DETECTED Final   Candida glabrata NOT DETECTED NOT DETECTED Final   Candida krusei NOT DETECTED NOT DETECTED Final   Candida parapsilosis NOT DETECTED NOT DETECTED Final   Candida tropicalis NOT DETECTED NOT DETECTED Final   Cryptococcus neoformans/gattii NOT DETECTED NOT DETECTED Final   Methicillin resistance mecA/C DETECTED (A) NOT DETECTED Final    Comment: CRITICAL RESULT CALLED TO, READ BACK BY AND VERIFIED WITH: RN Chauncey Cruel JEFFERS [phone] '@1816'$  FH Performed at [facility], [address] [place], [place] [address]   Aerobic Culture w Gram Stain (superficial specimen)     Status: None (Preliminary result)   Collection Time: 07/06/22  9:12 AM   Specimen: Wound  Result Value Ref Range Status   Specimen Description WOUND  Final   Special Requests ABDOMEN  Final   Gram Stain   Final    RARE WBC PRESENT, PREDOMINANTLY PMN RARE GRAM NEGATIVE RODS RARE BUDDING YEAST SEEN Performed at [facility], [place] [address], [place], [place] [address]    Culture   Final    FEW KLEBSIELLA PNEUMONIAE Confirmed Extended Spectrum Beta-Lactamase Producer (ESBL).  In bloodstream infections from ESBL organisms, carbapenems are preferred over piperacillin/tazobactam. They are shown to have a lower risk of mortality.    Report Status PENDING  Incomplete   Organism ID, Bacteria KLEBSIELLA PNEUMONIAE  Final      Susceptibility    Klebsiella pneumoniae - MIC*    AMPICILLIN >=32 RESISTANT Resistant     CEFAZOLIN >=64 RESISTANT Resistant     CEFEPIME 2 SENSITIVE Sensitive     CEFTAZIDIME RESISTANT Resistant     CEFTRIAXONE 1 SENSITIVE Sensitive     CIPROFLOXACIN <=0.25 SENSITIVE Sensitive     GENTAMICIN <=1 SENSITIVE Sensitive     IMIPENEM <=0.25 SENSITIVE Sensitive     TRIMETH/SULFA <=20 SENSITIVE Sensitive     AMPICILLIN/SULBACTAM >=32 RESISTANT Resistant  PIP/TAZO >=128 RESISTANT Resistant     * FEW KLEBSIELLA PNEUMONIAE  Culture, blood (Routine X 2) w Reflex to ID Panel     Status: None (Preliminary result)   Collection Time: 07/07/22 10:33 PM   Specimen: BLOOD  Result Value Ref Range Status   Specimen Description BLOOD LEFT ANTECUBITAL  Final   Special Requests   Final    BOTTLES DRAWN AEROBIC AND ANAEROBIC Blood Culture adequate volume   Culture   Final    NO GROWTH < 12 HOURS Performed at [facility], [place] [address] [place], [place] [address]    Report Status PENDING  Incomplete  Culture, blood (Routine X 2) w Reflex to ID Panel     Status: None (Preliminary result)   Collection Time: 07/07/22 10:33 PM   Specimen: BLOOD  Result Value Ref Range Status   Specimen Description BLOOD LEFT ANTECUBITAL  Final   Special Requests   Final    BOTTLES DRAWN AEROBIC AND ANAEROBIC Blood Culture adequate volume   Culture   Final    NO GROWTH < 12 HOURS Performed at [facility], [place] [address], [place],  [address]    Report Status PENDING  Incomplete    Radiology Studies: CT ABDOMEN PELVIS WO CONTRAST  Result Date: 07/07/2022 CLINICAL DATA:  Sepsis. EXAM: CT ABDOMEN AND PELVIS WITHOUT CONTRAST TECHNIQUE: Multidetector CT imaging of the abdomen and pelvis was performed following the standard protocol without IV contrast. RADIATION DOSE REDUCTION: This exam was performed according to the departmental dose-optimization program which includes automated exposure control, adjustment of the mA  and/or kV according to patient size and/or use of iterative reconstruction technique. COMPARISON:  June 29, 2022. FINDINGS: Lower chest: Mild to moderate size bilateral pleural effusions are noted, right greater than left. Right lower lobe atelectasis or pneumonia is noted. Hepatobiliary: Stable small subcapsular fluid collection is noted posteriorly along the liver. No gallstones, gallbladder wall thickening, or biliary dilatation. Pancreas: Unremarkable. No pancreatic ductal dilatation or surrounding inflammatory changes. Spleen: Stable subcapsular fluid collection is seen along the spleen. Adrenals/Urinary Tract: Adrenal glands are unremarkable. Kidneys are normal, without renal calculi, focal lesion, or hydronephrosis. Bladder is unremarkable. Stomach/Bowel: Small air and fluid collection is seen anterior to the distal stomach which now measures 4.1 x 1.4 cm and is significantly decreased compared to prior exam. There remains a surgical drain in this area which is unchanged. This small fluid collection appears to communicate with overlying anterior midline surgical incision. The stomach is not dilated. Residual contrast is seen in the colon. No large or small bowel dilatation is noted. Vascular/Lymphatic: Aortic atherosclerosis. No enlarged abdominal or pelvic lymph nodes. Reproductive: Status post hysterectomy. No adnexal masses. Other: 4.2 x 3.0 cm fluid collection is again noted posteriorly in the pelvis is again noted and not significantly changed compared to prior exam. Potentially this may represent abscess. Musculoskeletal: Multilevel degenerative disc disease is noted in the lumbar spine. No acute osseous abnormality is noted. IMPRESSION: Mild to moderate size pleural effusions are noted, right greater than left. Right lower lobe atelectasis or pneumonia is noted. Stable subcapsular fluid collections are noted along the liver and spleen compared to prior exam. Small air and fluid collection seen anterior  to distal stomach on prior exam now measures 4.1 x 1.4 cm and is significantly decreased. This small fluid collection appears to communicate with overlying anterior midline surgical incision. Stable position of surgical drain in this area. Grossly stable 4.2 x 3.0 cm fluid  collection noted posteriorly in the pelvis. The possibility of this representing a abscess cannot be excluded. Aortic Atherosclerosis (ICD10-I70.0). Electronically Signed   By: Marijo Conception M.D.   On: 07/07/2022 16:13     Scheduled Meds: Please see MAR   Yaakov Guthrie, MD 07/08/2022, 10:07 PM

## 2022-07-09 ENCOUNTER — Other Ambulatory Visit (HOSPITAL_COMMUNITY): Payer: Self-pay

## 2022-07-09 LAB — CBC
HCT: 21.4 % — ABNORMAL LOW (ref 36.0–46.0)
Hemoglobin: 7 g/dL — ABNORMAL LOW (ref 12.0–15.0)
MCH: 31.1 pg (ref 26.0–34.0)
MCHC: 32.7 g/dL (ref 30.0–36.0)
MCV: 95.1 fL (ref 80.0–100.0)
Platelets: 309 10*3/uL (ref 150–400)
RBC: 2.25 MIL/uL — ABNORMAL LOW (ref 3.87–5.11)
RDW: 18 % — ABNORMAL HIGH (ref 11.5–15.5)
WBC: 16.3 10*3/uL — ABNORMAL HIGH (ref 4.0–10.5)
nRBC: 0 % (ref 0.0–0.2)

## 2022-07-09 LAB — BASIC METABOLIC PANEL
Anion gap: 13 (ref 5–15)
BUN: 70 mg/dL — ABNORMAL HIGH (ref 8–23)
CO2: 22 mmol/L (ref 22–32)
Calcium: 12.2 mg/dL — ABNORMAL HIGH (ref 8.9–10.3)
Chloride: 106 mmol/L (ref 98–111)
Creatinine, Ser: 2.13 mg/dL — ABNORMAL HIGH (ref 0.44–1.00)
GFR, Estimated: 23 mL/min — ABNORMAL LOW (ref >60)
Glucose, Bld: 177 mg/dL — ABNORMAL HIGH (ref 70–99)
Potassium: 3.5 mmol/L (ref 3.5–5.1)
Sodium: 141 mmol/L (ref 135–145)

## 2022-07-09 LAB — HEMOGLOBIN AND HEMATOCRIT, BLOOD
HCT: 24.4 % — ABNORMAL LOW (ref 36.0–46.0)
Hemoglobin: 7.7 g/dL — ABNORMAL LOW (ref 12.0–15.0)

## 2022-07-09 LAB — AEROBIC CULTURE W GRAM STAIN (SUPERFICIAL SPECIMEN)

## 2022-07-09 LAB — ECHOCARDIOGRAM COMPLETE
AR max vel: 2.38 cm2
AV Area VTI: 2.17 cm2
AV Area mean vel: 2.33 cm2
AV Mean grad: 3 mmHg
AV Peak grad: 6.1 mmHg
Ao pk vel: 1.23 m/s
Area-P 1/2: 4.57 cm2
S' Lateral: 2.9 cm

## 2022-07-09 NOTE — Progress Notes (Signed)
  Echocardiogram 2D Echocardiogram has been performed.  Carolyn Erickson F 07/09/2022, 2:35 PM

## 2022-07-10 ENCOUNTER — Other Ambulatory Visit (HOSPITAL_COMMUNITY): Payer: Self-pay

## 2022-07-10 LAB — CBC
HCT: 24.5 % — ABNORMAL LOW (ref 36.0–46.0)
Hemoglobin: 7.7 g/dL — ABNORMAL LOW (ref 12.0–15.0)
MCH: 30.1 pg (ref 26.0–34.0)
MCHC: 31.4 g/dL (ref 30.0–36.0)
MCV: 95.7 fL (ref 80.0–100.0)
Platelets: 440 10*3/uL — ABNORMAL HIGH (ref 150–400)
RBC: 2.56 MIL/uL — ABNORMAL LOW (ref 3.87–5.11)
RDW: 18 % — ABNORMAL HIGH (ref 11.5–15.5)
WBC: 18.2 10*3/uL — ABNORMAL HIGH (ref 4.0–10.5)
nRBC: 0 % (ref 0.0–0.2)

## 2022-07-10 LAB — IRON AND TIBC
Iron: 58 ug/dL (ref 28–170)
Saturation Ratios: 40 % — ABNORMAL HIGH (ref 10.4–31.8)
TIBC: 144 ug/dL — ABNORMAL LOW (ref 250–450)
UIBC: 86 ug/dL

## 2022-07-10 LAB — MAGNESIUM: Magnesium: 2.1 mg/dL (ref 1.7–2.4)

## 2022-07-10 LAB — BASIC METABOLIC PANEL
Anion gap: 9 (ref 5–15)
BUN: 69 mg/dL — ABNORMAL HIGH (ref 8–23)
CO2: 24 mmol/L (ref 22–32)
Calcium: 12 mg/dL — ABNORMAL HIGH (ref 8.9–10.3)
Chloride: 111 mmol/L (ref 98–111)
Creatinine, Ser: 2.03 mg/dL — ABNORMAL HIGH (ref 0.44–1.00)
GFR, Estimated: 25 mL/min — ABNORMAL LOW (ref >60)
Glucose, Bld: 121 mg/dL — ABNORMAL HIGH (ref 70–99)
Potassium: 3.8 mmol/L (ref 3.5–5.1)
Sodium: 144 mmol/L (ref 135–145)

## 2022-07-10 LAB — PHOSPHORUS: Phosphorus: 5.1 mg/dL — ABNORMAL HIGH (ref 2.5–4.6)

## 2022-07-10 LAB — OCCULT BLOOD X 1 CARD TO LAB, STOOL: Fecal Occult Bld: POSITIVE — AB

## 2022-07-10 LAB — CULTURE, BLOOD (ROUTINE X 2)
Culture: NO GROWTH
Special Requests: ADEQUATE

## 2022-07-10 LAB — PROTIME-INR
INR: 1.2 (ref 0.8–1.2)
Prothrombin Time: 15.4 seconds — ABNORMAL HIGH (ref 11.4–15.2)

## 2022-07-11 LAB — TRIGLYCERIDES: Triglycerides: 105 mg/dL (ref <150)

## 2022-07-12 LAB — CULTURE, BLOOD (ROUTINE X 2)
Culture: NO GROWTH
Culture: NO GROWTH
Special Requests: ADEQUATE
Special Requests: ADEQUATE

## 2022-07-12 LAB — METHYLMALONIC ACID, SERUM: Methylmalonic Acid, Quantitative: 560 nmol/L — ABNORMAL HIGH (ref 0–378)

## 2022-07-13 LAB — BASIC METABOLIC PANEL
Anion gap: 9 (ref 5–15)
BUN: 83 mg/dL — ABNORMAL HIGH (ref 8–23)
CO2: 26 mmol/L (ref 22–32)
Calcium: 11.2 mg/dL — ABNORMAL HIGH (ref 8.9–10.3)
Chloride: 102 mmol/L (ref 98–111)
Creatinine, Ser: 2.51 mg/dL — ABNORMAL HIGH (ref 0.44–1.00)
GFR, Estimated: 19 mL/min — ABNORMAL LOW (ref >60)
Glucose, Bld: 131 mg/dL — ABNORMAL HIGH (ref 70–99)
Potassium: 4.3 mmol/L (ref 3.5–5.1)
Sodium: 137 mmol/L (ref 135–145)

## 2022-07-13 LAB — CBC
HCT: 23.6 % — ABNORMAL LOW (ref 36.0–46.0)
Hemoglobin: 7.3 g/dL — ABNORMAL LOW (ref 12.0–15.0)
MCH: 30.5 pg (ref 26.0–34.0)
MCHC: 30.9 g/dL (ref 30.0–36.0)
MCV: 98.7 fL (ref 80.0–100.0)
Platelets: 373 10*3/uL (ref 150–400)
RBC: 2.39 MIL/uL — ABNORMAL LOW (ref 3.87–5.11)
RDW: 17.5 % — ABNORMAL HIGH (ref 11.5–15.5)
WBC: 19.1 10*3/uL — ABNORMAL HIGH (ref 4.0–10.5)
nRBC: 0.1 % (ref 0.0–0.2)

## 2022-07-13 LAB — PHOSPHORUS: Phosphorus: 6.9 mg/dL — ABNORMAL HIGH (ref 2.5–4.6)

## 2022-07-13 LAB — MAGNESIUM: Magnesium: 2.3 mg/dL (ref 1.7–2.4)

## 2022-07-14 LAB — CBC
HCT: 20.9 % — ABNORMAL LOW (ref 36.0–46.0)
Hemoglobin: 6.9 g/dL — CL (ref 12.0–15.0)
MCH: 31.8 pg (ref 26.0–34.0)
MCHC: 33 g/dL (ref 30.0–36.0)
MCV: 96.3 fL (ref 80.0–100.0)
Platelets: 343 10*3/uL (ref 150–400)
RBC: 2.17 MIL/uL — ABNORMAL LOW (ref 3.87–5.11)
RDW: 17.4 % — ABNORMAL HIGH (ref 11.5–15.5)
WBC: 19.8 10*3/uL — ABNORMAL HIGH (ref 4.0–10.5)
nRBC: 0.1 % (ref 0.0–0.2)

## 2022-07-14 LAB — BASIC METABOLIC PANEL
Anion gap: 9 (ref 5–15)
BUN: 97 mg/dL — ABNORMAL HIGH (ref 8–23)
CO2: 27 mmol/L (ref 22–32)
Calcium: 10.4 mg/dL — ABNORMAL HIGH (ref 8.9–10.3)
Chloride: 100 mmol/L (ref 98–111)
Creatinine, Ser: 2.58 mg/dL — ABNORMAL HIGH (ref 0.44–1.00)
GFR, Estimated: 18 mL/min — ABNORMAL LOW (ref >60)
Glucose, Bld: 145 mg/dL — ABNORMAL HIGH (ref 70–99)
Potassium: 4.6 mmol/L (ref 3.5–5.1)
Sodium: 136 mmol/L (ref 135–145)

## 2022-07-14 LAB — MAGNESIUM: Magnesium: 2.4 mg/dL (ref 1.7–2.4)

## 2022-07-14 LAB — PREPARE RBC (CROSSMATCH)

## 2022-07-16 LAB — BPAM RBC
Blood Product Expiration Date: 202309062359
Unit Type and Rh: 6200

## 2022-07-16 LAB — TYPE AND SCREEN
ABO/RH(D): A POS
Antibody Screen: NEGATIVE
Unit division: 0

## 2022-07-20 ENCOUNTER — Telehealth: Payer: Self-pay | Admitting: General Surgery

## 2022-07-20 DIAGNOSIS — K316 Fistula of stomach and duodenum: Secondary | ICD-10-CM

## 2022-07-20 NOTE — Telephone Encounter (Signed)
Noted patient had passed on checking in on her record after being out of town. Called Gerald Stabs to give my condolences.   Curlene Labrum, MD [facility] [address] [place], [place] [address] [phone] (office)

## 2022-07-24 ENCOUNTER — Ambulatory Visit: Payer: Medicare PPO | Admitting: Gastroenterology

## 2022-07-27 ENCOUNTER — Encounter: Payer: Medicare PPO | Admitting: General Surgery

## 2022-07-31 ENCOUNTER — Ambulatory Visit: Payer: Medicare HMO | Admitting: Family Medicine

## 2022-08-04 DEATH — deceased

## 2022-10-18 ENCOUNTER — Ambulatory Visit: Payer: Medicare HMO | Admitting: Physician Assistant

## 2022-12-07 ENCOUNTER — Encounter: Payer: Medicare HMO | Admitting: Psychology

## 2022-12-15 ENCOUNTER — Encounter: Payer: Medicare HMO | Admitting: Psychology

## 2023-01-03 IMAGING — MR MR HEAD W/O CM
10 series · 48 of 48 positions shown · non-contrast
Comparison: Head CT 01/13/2022.  Brain MRI 02/07/2010.

CLINICAL DATA: Provided history: Memory loss. Additional history
provided by scanning technologist: Increased memory loss for 6
months.

EXAM:
MRI HEAD WITHOUT CONTRAST
TECHNIQUE: Multiplanar, multiecho pulse sequences of the brain and surrounding
structures were obtained without intravenous contrast.

[Series 2: T1 · sagittal · 5.0mm · 0.45mm/px · 2 of 21 slices shown]
[im 1/21]
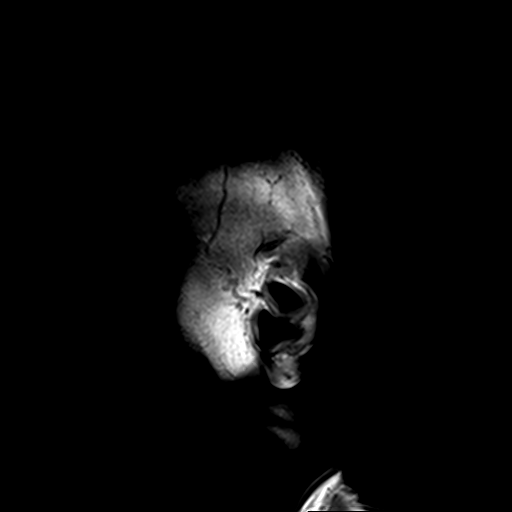
[im 21/21]
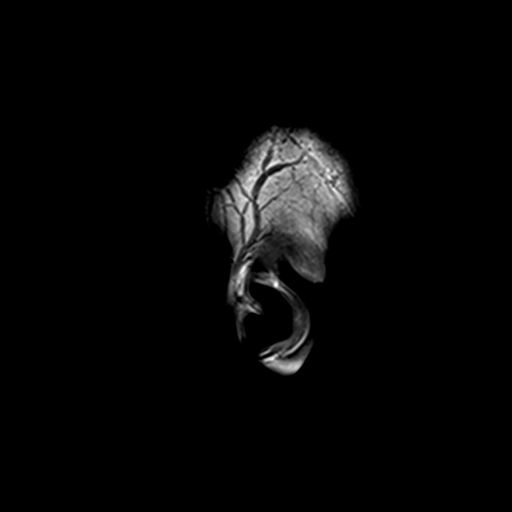

[Series 3: DWI · axial · 3.0mm · 1.80mm/px · z∈[-54,+91]mm · 9 of 100 slices shown (1 of 4)]
[im 1/100]
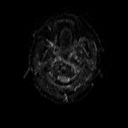
[im 13/100]
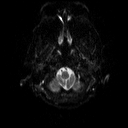
[im 25/100]
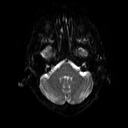
[im 38/100]
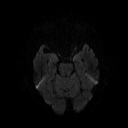
[im 50/100]
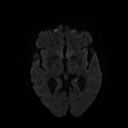
[im 62/100]
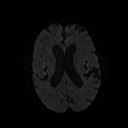
[im 75/100]
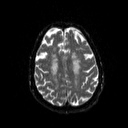
[im 87/100]
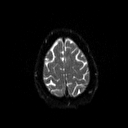
[im 100/100]
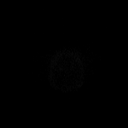

[Series 4: DWI · axial · 3.0mm · 1.80mm/px · z∈[-54,+91]mm · 5 of 50 slices shown (2 of 4)]
[im 1/50]
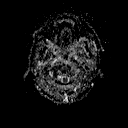
[im 13/50]
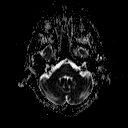
[im 25/50]
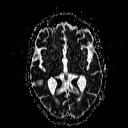
[im 37/50]
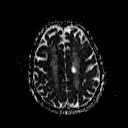
[im 50/50]
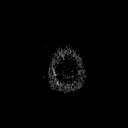

[Series 5: DWI · coronal · 5.0mm · 1.80mm/px · 6 of 68 slices shown (3 of 4)]
[im 1/68]
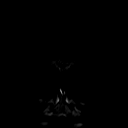
[im 14/68]
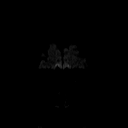
[im 27/68]
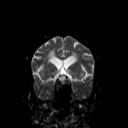
[im 41/68]
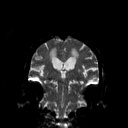
[im 54/68]
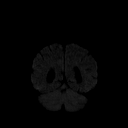
[im 68/68]
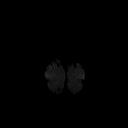

[Series 6: DWI · coronal · 5.0mm · 1.80mm/px · 3 of 34 slices shown (4 of 4)]
[im 1/34]
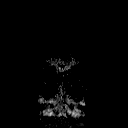
[im 17/34]
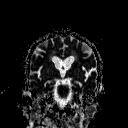
[im 34/34]
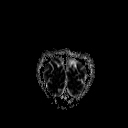

[Series 7: T2 · axial · 5.0mm · 0.51mm/px · z∈[-48,+98]mm · 2 of 22 slices shown (1 of 2)]
[im 1/22]
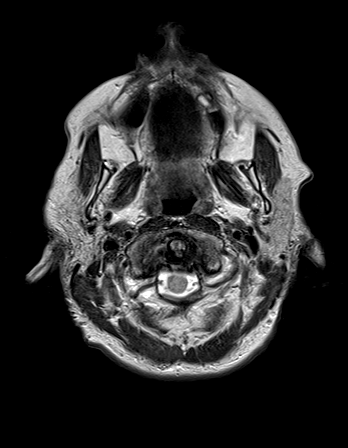
[im 22/22]
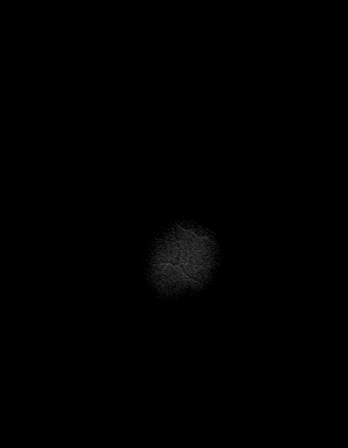

[Series 8: FLAIR · axial · 3.0mm · 0.45mm/px · z∈[-50,+97]mm · 3 of 33 slices shown]
[im 1/33]
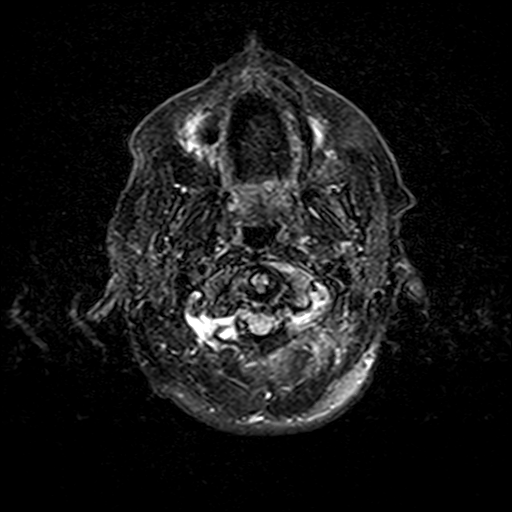
[im 17/33]
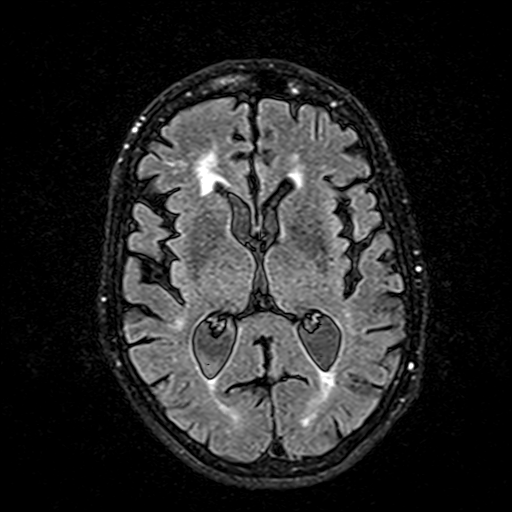
[im 33/33]
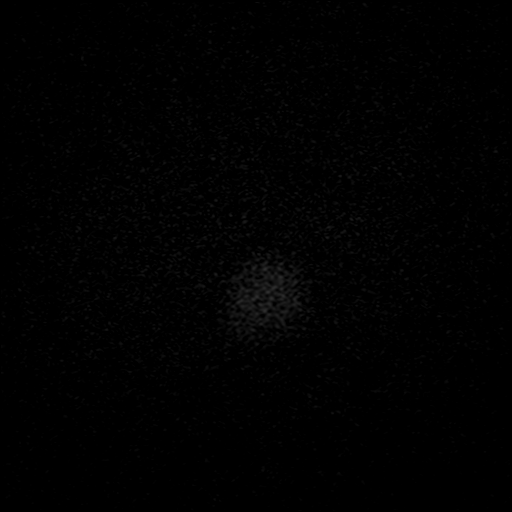

[Series 10: swi_images · axial · 4.0mm · 0.90mm/px · z∈[-45,+93]mm · 3 of 36 slices shown]
[im 1/36]
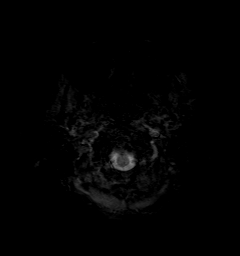
[im 18/36]
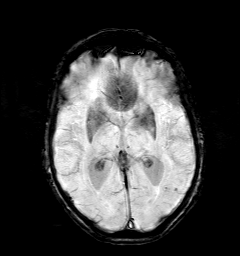
[im 36/36]
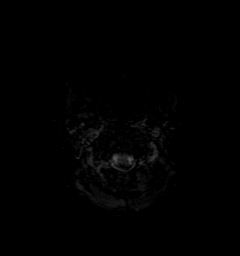

[Series 11: t1_mpr_tra · axial · 1.0mm · 0.71mm/px · z∈[-45,+96]mm · 13 of 144 slices shown]
[im 1/144]
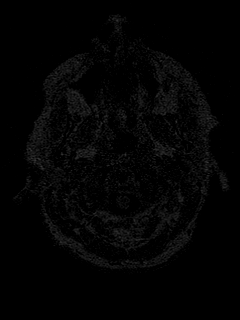
[im 12/144]
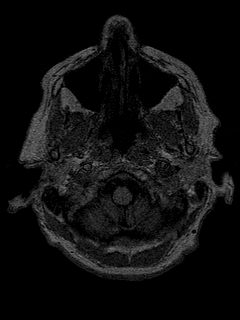
[im 24/144]
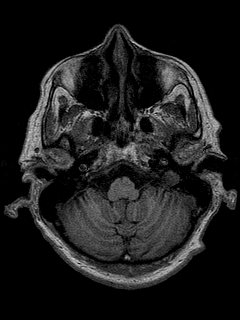
[im 36/144]
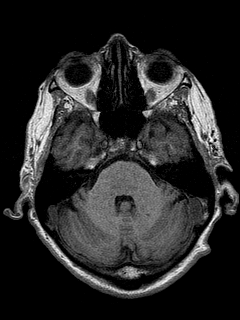
[im 48/144]
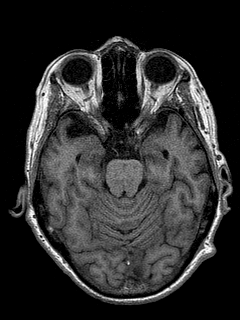
[im 60/144]
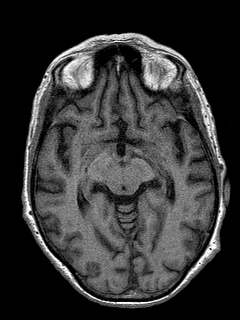
[im 72/144]
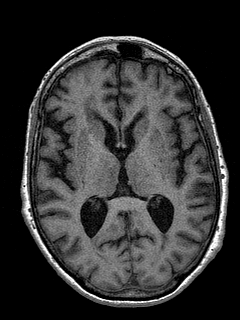
[im 84/144]
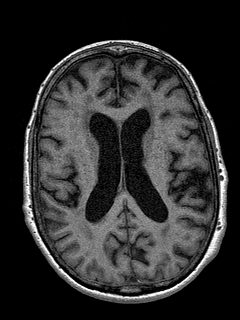
[im 96/144]
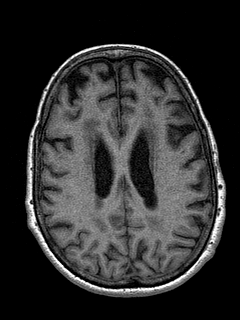
[im 108/144]
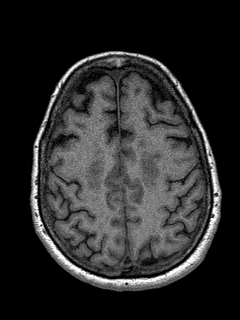
[im 120/144]
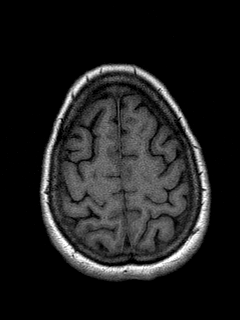
[im 132/144]
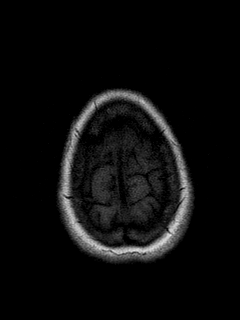
[im 144/144]
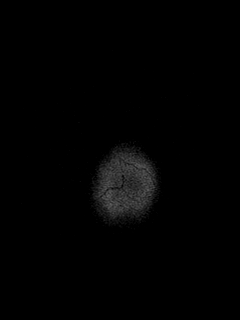

[Series 12: T2 · coronal · 5.0mm · 0.45mm/px · 2 of 25 slices shown (2 of 2)]
[im 1/25]
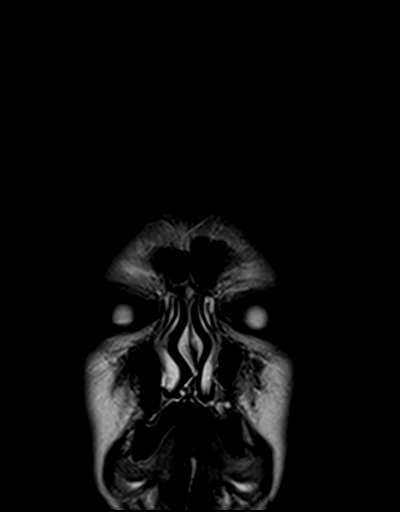
[im 25/25]
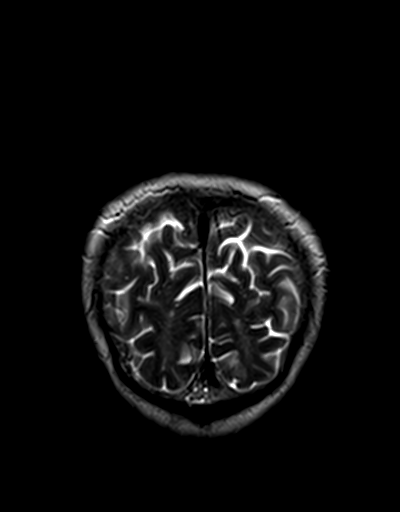

[48 of 48 positions shown; findings below may reference images not displayed]

FINDINGS: Brain:

Mild for age generalized cerebral and cerebellar atrophy.

Moderate to moderately advanced patchy and confluent T2 FLAIR
hyperintense signal abnormality within the cerebral white matter,
nonspecific but compatible with chronic small vessel ischemic
disease. Moderate chronic small vessel ischemic changes are also
present within the pons.

A known right internal auditory canal mass (probable vestibular
schwannoma) is incompletely reassessed on this noncontrast
examination (series 7, image 6). This mass was more fully
characterized on the prior contrast-enhanced brain MRI of
02/07/2010.

There is no acute infarct.

No chronic intracranial blood products.

No extra-axial fluid collection.

No midline shift.

Vascular: Maintained flow voids within the proximal large arterial
vessels.

Skull and upper cervical spine: No focal suspicious marrow lesion.
Incompletely assessed cervical spondylosis. Reversal of the expected
cervical lordosis. Trace grade 1 anterolisthesis at C2-C3 and C3-C4.
Trace grade 1 retrolisthesis at C4-C5.

Sinuses/Orbits: Visualized orbits show no acute finding. Prior
bilateral ocular lens replacement. Minimal mucosal thickening within
the inferior left maxillary sinus.

Other: Trace fluid within the bilateral mastoid air cells.
IMPRESSION: No evidence of acute intracranial abnormality.

A known right internal auditory canal mass (probable vestibular
schwannoma) is incompletely reassessed on this non-contrast
examination. This mass was more fully characterized on the prior
contrast-enhanced brain MRI of 02/07/2010.

Moderate to moderately advanced chronic small vessel ischemic
changes within the cerebral white matter, progressed from the prior
brain MRI. Moderate chronic small vessel ischemic changes are also
present within the pons.

Mild generalized parenchymal atrophy.

Minimal mucosal thickening within the left maxillary sinus.

Trace fluid within the bilateral mastoid air cells.

Incompletely assessed cervical spondylosis.
